# Patient Record
Sex: Male | Born: 1945
Health system: Southern US, Community
[De-identification: ages and names within clinical notes are randomized; demographics above are authoritative.]

## PROBLEM LIST (undated history)

## (undated) DIAGNOSIS — G40209 Localization-related (focal) (partial) symptomatic epilepsy and epileptic syndromes with complex partial seizures, not intractable, without status epilepticus: Secondary | ICD-10-CM

## (undated) DIAGNOSIS — C61 Malignant neoplasm of prostate: Secondary | ICD-10-CM

## (undated) DIAGNOSIS — N390 Urinary tract infection, site not specified: Secondary | ICD-10-CM

## (undated) DIAGNOSIS — R569 Unspecified convulsions: Secondary | ICD-10-CM

## (undated) DIAGNOSIS — E7800 Pure hypercholesterolemia, unspecified: Secondary | ICD-10-CM

## (undated) DIAGNOSIS — Z464 Encounter for fitting and adjustment of orthodontic device: Secondary | ICD-10-CM

## (undated) DIAGNOSIS — G40909 Epilepsy, unspecified, not intractable, without status epilepticus: Secondary | ICD-10-CM

## (undated) DIAGNOSIS — M722 Plantar fascial fibromatosis: Secondary | ICD-10-CM

## (undated) DIAGNOSIS — G450 Vertebro-basilar artery syndrome: Secondary | ICD-10-CM

## (undated) DIAGNOSIS — H5710 Ocular pain, unspecified eye: Secondary | ICD-10-CM

## (undated) DIAGNOSIS — K219 Gastro-esophageal reflux disease without esophagitis: Secondary | ICD-10-CM

## (undated) DIAGNOSIS — G43909 Migraine, unspecified, not intractable, without status migrainosus: Secondary | ICD-10-CM

## (undated) DIAGNOSIS — M5412 Radiculopathy, cervical region: Secondary | ICD-10-CM

## (undated) DIAGNOSIS — K635 Polyp of colon: Secondary | ICD-10-CM

## (undated) DIAGNOSIS — IMO0001 Reserved for inherently not codable concepts without codable children: Secondary | ICD-10-CM

## (undated) DIAGNOSIS — R42 Dizziness and giddiness: Secondary | ICD-10-CM

## (undated) DIAGNOSIS — M7710 Lateral epicondylitis, unspecified elbow: Secondary | ICD-10-CM

## (undated) DIAGNOSIS — M109 Gout, unspecified: Secondary | ICD-10-CM

## (undated) HISTORY — DX: Vertebro-basilar artery syndrome: G45.0

## (undated) HISTORY — DX: Plantar fascial fibromatosis: M72.2

## (undated) HISTORY — DX: Pure hypercholesterolemia, unspecified: E78.00

## (undated) HISTORY — PX: NECK SURGERY: SHX720

## (undated) HISTORY — DX: Lateral epicondylitis, unspecified elbow: M77.10

## (undated) HISTORY — PX: HEMORROIDECTOMY: SUR656

## (undated) HISTORY — DX: Polyp of colon: K63.5

## (undated) HISTORY — DX: Epilepsy, unspecified, not intractable, without status epilepticus: G40.909

## (undated) HISTORY — DX: Reserved for inherently not codable concepts without codable children: IMO0001

## (undated) HISTORY — DX: Gastro-esophageal reflux disease without esophagitis: K21.9

## (undated) HISTORY — DX: Localization-related (focal) (partial) symptomatic epilepsy and epileptic syndromes with complex partial seizures, not intractable, without status epilepticus: G40.209

## (undated) HISTORY — DX: Migraine, unspecified, not intractable, without status migrainosus: G43.909

## (undated) HISTORY — DX: Ocular pain, unspecified eye: H57.10

## (undated) HISTORY — DX: Dizziness and giddiness: R42

## (undated) HISTORY — DX: Radiculopathy, cervical region: M54.12

## (undated) HISTORY — DX: Encounter for fitting and adjustment of orthodontic device: Z46.4

## (undated) HISTORY — PX: BACK SURGERY: SHX140

## (undated) HISTORY — PX: PROSTATE BIOPSY: SHX241

## (undated) HISTORY — DX: Unspecified convulsions: R56.9

## (undated) HISTORY — DX: Urinary tract infection, site not specified: N39.0

## (undated) HISTORY — DX: Gout, unspecified: M10.9

---

## 1998-10-22 ENCOUNTER — Encounter: Payer: Self-pay | Admitting: Emergency Medicine

## 1998-10-22 ENCOUNTER — Emergency Department (HOSPITAL_COMMUNITY): Admission: EM | Admit: 1998-10-22 | Discharge: 1998-10-22 | Payer: Self-pay | Admitting: Emergency Medicine

## 2001-11-23 ENCOUNTER — Emergency Department (HOSPITAL_COMMUNITY): Admission: EM | Admit: 2001-11-23 | Discharge: 2001-11-23 | Payer: Self-pay | Admitting: *Deleted

## 2002-03-21 ENCOUNTER — Encounter: Payer: Self-pay | Admitting: Emergency Medicine

## 2002-03-21 ENCOUNTER — Emergency Department (HOSPITAL_COMMUNITY): Admission: EM | Admit: 2002-03-21 | Discharge: 2002-03-21 | Payer: Self-pay | Admitting: Emergency Medicine

## 2004-07-03 ENCOUNTER — Emergency Department (HOSPITAL_COMMUNITY): Admission: EM | Admit: 2004-07-03 | Discharge: 2004-07-03 | Payer: Self-pay | Admitting: Emergency Medicine

## 2004-08-30 ENCOUNTER — Ambulatory Visit (HOSPITAL_COMMUNITY): Admission: RE | Admit: 2004-08-30 | Discharge: 2004-08-30 | Payer: Self-pay | Admitting: Gastroenterology

## 2005-04-17 ENCOUNTER — Encounter: Admission: RE | Admit: 2005-04-17 | Discharge: 2005-04-17 | Payer: Self-pay | Admitting: Neurological Surgery

## 2005-05-15 ENCOUNTER — Ambulatory Visit (HOSPITAL_COMMUNITY): Admission: RE | Admit: 2005-05-15 | Discharge: 2005-05-16 | Payer: Self-pay | Admitting: Neurological Surgery

## 2006-03-13 ENCOUNTER — Encounter: Admission: RE | Admit: 2006-03-13 | Discharge: 2006-03-13 | Payer: Self-pay | Admitting: Family Medicine

## 2013-06-13 ENCOUNTER — Encounter: Payer: Self-pay | Admitting: *Deleted

## 2013-10-12 DIAGNOSIS — D3131 Benign neoplasm of right choroid: Secondary | ICD-10-CM | POA: Insufficient documentation

## 2013-10-12 DIAGNOSIS — H43399 Other vitreous opacities, unspecified eye: Secondary | ICD-10-CM | POA: Insufficient documentation

## 2013-10-21 ENCOUNTER — Other Ambulatory Visit: Payer: Self-pay | Admitting: Gastroenterology

## 2013-11-12 DIAGNOSIS — G40309 Generalized idiopathic epilepsy and epileptic syndromes, not intractable, without status epilepticus: Secondary | ICD-10-CM | POA: Insufficient documentation

## 2013-12-02 LAB — HM COLONOSCOPY

## 2014-03-08 ENCOUNTER — Emergency Department (HOSPITAL_COMMUNITY)
Admission: EM | Admit: 2014-03-08 | Discharge: 2014-03-08 | Disposition: A | Discharge status: Home / Self Care | Payer: Medicare Other | Attending: Emergency Medicine | Admitting: Emergency Medicine

## 2014-03-08 ENCOUNTER — Encounter (HOSPITAL_COMMUNITY): Payer: Self-pay

## 2014-03-08 ENCOUNTER — Emergency Department (HOSPITAL_COMMUNITY): Payer: Medicare Other

## 2014-03-08 DIAGNOSIS — Z8639 Personal history of other endocrine, nutritional and metabolic disease: Secondary | ICD-10-CM | POA: Diagnosis not present

## 2014-03-08 DIAGNOSIS — Z8739 Personal history of other diseases of the musculoskeletal system and connective tissue: Secondary | ICD-10-CM | POA: Insufficient documentation

## 2014-03-08 DIAGNOSIS — G43909 Migraine, unspecified, not intractable, without status migrainosus: Secondary | ICD-10-CM | POA: Diagnosis not present

## 2014-03-08 DIAGNOSIS — K219 Gastro-esophageal reflux disease without esophagitis: Secondary | ICD-10-CM | POA: Diagnosis not present

## 2014-03-08 DIAGNOSIS — Z87440 Personal history of urinary (tract) infections: Secondary | ICD-10-CM | POA: Insufficient documentation

## 2014-03-08 DIAGNOSIS — Z86010 Personal history of colonic polyps: Secondary | ICD-10-CM | POA: Insufficient documentation

## 2014-03-08 DIAGNOSIS — Z87891 Personal history of nicotine dependence: Secondary | ICD-10-CM | POA: Diagnosis not present

## 2014-03-08 DIAGNOSIS — R42 Dizziness and giddiness: Secondary | ICD-10-CM | POA: Insufficient documentation

## 2014-03-08 DIAGNOSIS — Z79899 Other long term (current) drug therapy: Secondary | ICD-10-CM | POA: Insufficient documentation

## 2014-03-08 DIAGNOSIS — G40909 Epilepsy, unspecified, not intractable, without status epilepticus: Secondary | ICD-10-CM | POA: Diagnosis not present

## 2014-03-08 LAB — CBC
HCT: 41.8 % (ref 39.0–52.0)
HEMOGLOBIN: 14.2 g/dL (ref 13.0–17.0)
MCH: 30.5 pg (ref 26.0–34.0)
MCHC: 34 g/dL (ref 30.0–36.0)
MCV: 89.7 fL (ref 78.0–100.0)
PLATELETS: 196 10*3/uL (ref 150–400)
RBC: 4.66 MIL/uL (ref 4.22–5.81)
RDW: 12.7 % (ref 11.5–15.5)
WBC: 4.8 10*3/uL (ref 4.0–10.5)

## 2014-03-08 LAB — URINALYSIS, ROUTINE W REFLEX MICROSCOPIC
Bilirubin Urine: NEGATIVE
Glucose, UA: NEGATIVE mg/dL
Hgb urine dipstick: NEGATIVE
Ketones, ur: NEGATIVE mg/dL
LEUKOCYTES UA: NEGATIVE
NITRITE: NEGATIVE
PH: 5.5 (ref 5.0–8.0)
Protein, ur: NEGATIVE mg/dL
SPECIFIC GRAVITY, URINE: 1.012 (ref 1.005–1.030)
UROBILINOGEN UA: 0.2 mg/dL (ref 0.0–1.0)

## 2014-03-08 LAB — DIFFERENTIAL
Basophils Absolute: 0 10*3/uL (ref 0.0–0.1)
Basophils Relative: 1 % (ref 0–1)
Eosinophils Absolute: 0.2 10*3/uL (ref 0.0–0.7)
Eosinophils Relative: 4 % (ref 0–5)
LYMPHS ABS: 1.5 10*3/uL (ref 0.7–4.0)
LYMPHS PCT: 31 % (ref 12–46)
MONO ABS: 0.5 10*3/uL (ref 0.1–1.0)
MONOS PCT: 11 % (ref 3–12)
NEUTROS ABS: 2.6 10*3/uL (ref 1.7–7.7)
Neutrophils Relative %: 53 % (ref 43–77)

## 2014-03-08 LAB — COMPREHENSIVE METABOLIC PANEL
ALBUMIN: 3.6 g/dL (ref 3.5–5.2)
ALT: 14 U/L (ref 0–53)
ANION GAP: 14 (ref 5–15)
AST: 14 U/L (ref 0–37)
Alkaline Phosphatase: 96 U/L (ref 39–117)
BUN: 18 mg/dL (ref 6–23)
CALCIUM: 9 mg/dL (ref 8.4–10.5)
CO2: 22 mEq/L (ref 19–32)
CREATININE: 0.93 mg/dL (ref 0.50–1.35)
Chloride: 101 mEq/L (ref 96–112)
GFR calc Af Amer: >90 mL/min (ref >90)
GFR calc non Af Amer: 85 mL/min — ABNORMAL LOW (ref >90)
GLUCOSE: 98 mg/dL (ref 70–99)
POTASSIUM: 4.4 meq/L (ref 3.7–5.3)
Sodium: 137 mEq/L (ref 137–147)
Total Bilirubin: 0.2 mg/dL — ABNORMAL LOW (ref 0.3–1.2)
Total Protein: 7.1 g/dL (ref 6.0–8.3)

## 2014-03-08 LAB — I-STAT CHEM 8, ED
BUN: 20 mg/dL (ref 6–23)
CALCIUM ION: 1.09 mmol/L — AB (ref 1.13–1.30)
CHLORIDE: 104 meq/L (ref 96–112)
Creatinine, Ser: 0.9 mg/dL (ref 0.50–1.35)
Glucose, Bld: 97 mg/dL (ref 70–99)
HEMATOCRIT: 46 % (ref 39.0–52.0)
Hemoglobin: 15.6 g/dL (ref 13.0–17.0)
Potassium: 4.3 mEq/L (ref 3.7–5.3)
Sodium: 138 mEq/L (ref 137–147)
TCO2: 22 mmol/L (ref 0–100)

## 2014-03-08 LAB — RAPID URINE DRUG SCREEN, HOSP PERFORMED
AMPHETAMINES: NOT DETECTED
BARBITURATES: NOT DETECTED
BENZODIAZEPINES: NOT DETECTED
Cocaine: NOT DETECTED
Opiates: NOT DETECTED
TETRAHYDROCANNABINOL: NOT DETECTED

## 2014-03-08 LAB — I-STAT TROPONIN, ED: TROPONIN I, POC: 0 ng/mL (ref 0.00–0.08)

## 2014-03-08 LAB — ETHANOL: Alcohol, Ethyl (B): <11 mg/dL (ref 0–11)

## 2014-03-08 LAB — I-STAT CG4 LACTIC ACID, ED: Lactic Acid, Venous: 1.02 mmol/L (ref 0.5–2.2)

## 2014-03-08 LAB — PROTIME-INR
INR: 1.04 (ref 0.00–1.49)
Prothrombin Time: 13.8 seconds (ref 11.6–15.2)

## 2014-03-08 LAB — APTT: APTT: 30 s (ref 24–37)

## 2014-03-08 NOTE — ED Notes (Signed)
Pt has had episode of vertigo in last three weeks.  Pt today felt same vertigo at work. Went home and took meds.  Went back to work and vertigo continued.  Pt wife states that coworkers made mention that patient seemed to be searching for words.  Not as coherent as normal. Pt is a/o at this time.  No neuro deficits noted.  Wife states patient has improved since she picked him up at work.  Sent by urgent care for eval "stroke"

## 2014-03-08 NOTE — ED Provider Notes (Signed)
CSN: [id number]     Arrival date & time 03/08/14  1106 History   First MD Initiated Contact with Patient 03/08/14 1116     Chief Complaint  Patient presents with  . Dizziness     (Consider location/radiation/quality/duration/timing/severity/associated sxs/prior Treatment) The history is provided by the patient and medical records.    This is a 68 year old male with past medical history significant for migraine headaches, seizure disorder, hyperlipidemia, GERD, presenting to the ED from urgent care for further evaluation of dizziness. Patient does have history of vertigo, seen at urgent care 3 weeks ago for his vertigo symptoms. He was prescribed meclizine and Phenergan with improvement. Upon waking this morning he had recurrent dizziness, which he described as a room spinning sensation.  States he took his Phenergan and meclizine with improvement of his symptoms. Upon arriving to work, coworkers noted that he appeared somewhat drowsy-- wife notes these medications have been known to make him drowsy.  States his speech appeared somewhat slurred, so coworkers called wife to come pick him up. States she called the urgent care and had him evaluated-- was sent to the ED for further evaluation.  Patient states his dizziness has improved, states the room is still spinning but now in slow motion. He denies any current nausea or vomiting.  No chest pain or shortness of breath. He states when he was trying to walk into the emergency room, he felt that his gait was somewhat ataxic to the right. Patient has no prior history of TIA or stroke. He is not currently on any anticoagulation. He does take Tegretol for his seizures, wife notes he has had only ever had 2 seizures.  Patient is followed by neurology, Dr. Sabra Heck in [place], [place].  Past Medical History  Diagnosis Date  . Migraine     headaches  . Seizure disorder     Dr. Sabra Heck ([facility])  . Cervical radiculopathy   . Hypercholesterolemia   . Lateral  epicondylitis     2002  . Plantar fasciitis   . GERD (gastroesophageal reflux disease)   . Colon polyp     (2006- adenoma and tubulovillous adenoma; 2010-adenomas; repeat 201)-Dr. Wynetta Emery  . Ophthalmalgia     Dr. Gershon Crane  . Orthodontics     Dr. Junius Roads  . UTI (urinary tract infection)    No past surgical history on file. No family history on file. History  Substance Use Topics  . Smoking status: Former Research scientist (life sciences)  . Smokeless tobacco: Not on file     Comment: Quit 1988, Was a heavy smoker (20-30/day)  . Alcohol Use: No    Review of Systems  Neurological: Positive for dizziness.  All other systems reviewed and are negative.   Allergies  Review of patient's allergies indicates no known allergies.  Home Medications   Prior to Admission medications   Medication Sig Start Date End Date Taking? Authorizing Provider  carbamazepine (TEGRETOL) 200 MG tablet Take 400 mg by mouth 3 (three) times daily.    Historical Provider, MD  omeprazole (PRILOSEC) 20 MG capsule Take 20 mg by mouth daily.    Historical Provider, MD  ZOLMitriptan (ZOMIG) 2.5 MG tablet Take 2.5 mg by mouth 2 (two) times daily.    Historical Provider, MD   BP 167/96 mmHg  Pulse 63  Temp(Src) 97.8 F (36.6 C) (Oral)  Resp 18  SpO2 96%   Physical Exam  Constitutional: He is oriented to person, place, and time. He appears well-developed and well-nourished. No distress.  HENT:  Head: Normocephalic and atraumatic.  Mouth/Throat: Oropharynx is clear and moist.  Eyes: Conjunctivae and EOM are normal. Pupils are equal, round, and reactive to light.  Neck: Normal range of motion.  Cardiovascular: Normal rate, regular rhythm and normal heart sounds.   Pulmonary/Chest: Effort normal and breath sounds normal. No respiratory distress. He has no wheezes.  Abdominal: Soft. Bowel sounds are normal. There is no tenderness. There is no guarding.  Musculoskeletal: Normal range of motion.  Neurological: He is alert and oriented to  person, place, and time. He displays no tremor. He displays no seizure activity.  AAOx3, answering questions and following commands appropriately; equal strength and sensation UE and LE bilaterally; CN grossly intact; moves all extremities appropriately without ataxia; normal finger to nose bilaterally; no pronator drift; symmetric forehead wrinkle and smile; no focal neuro deficits or facial asymmetry appreciated  Skin: Skin is warm and dry. He is not diaphoretic.  Psychiatric: He has a normal mood and affect.  Nursing note and vitals reviewed.   ED Course  Procedures (including critical care time) Labs Review Labs Reviewed  COMPREHENSIVE METABOLIC PANEL - Abnormal; Notable for the following:    Total Bilirubin 0.2 (*)    GFR calc non Af Amer 85 (*)    All other components within normal limits  URINALYSIS, ROUTINE W REFLEX MICROSCOPIC - Abnormal; Notable for the following:    APPearance CLOUDY (*)    All other components within normal limits  I-STAT CHEM 8, ED - Abnormal; Notable for the following:    Calcium, Ion 1.09 (*)    All other components within normal limits  ETHANOL  PROTIME-INR  APTT  CBC  DIFFERENTIAL  URINE RAPID DRUG SCREEN (HOSP PERFORMED)  I-STAT TROPOININ, ED  I-STAT TROPOININ, ED  I-STAT CG4 LACTIC ACID, ED    Imaging Review Ct Head Wo Contrast  03/08/2014   CLINICAL DATA:  Vertigo  EXAM: CT HEAD WITHOUT CONTRAST  TECHNIQUE: Contiguous axial images were obtained from the base of the skull through the vertex without intravenous contrast.  COMPARISON:  None.  FINDINGS: No evidence of parenchymal hemorrhage or extra-axial fluid collection. No mass lesion, mass effect, or midline shift.  No CT evidence of acute infarction.  Mild intracranial atherosclerosis.  Volume loss of the left temporal lobe with associated focal calcifications (series 2/image 5), suggesting sequela of prior infarct, infection, or trauma.  Cerebral volume is otherwise within normal limits. No  ventriculomegaly.  The visualized paranasal sinuses are essentially clear. The mastoid air cells are unopacified.  No evidence of calvarial fracture.  IMPRESSION: Sequela of prior infarct, infection, or trauma involving the left temporal lobe, chronic.  No evidence of acute intracranial abnormality.   Electronically Signed   By: Julian Hy M.D.   On: 03/08/2014 12:39   Mr Brain Wo Contrast  03/08/2014   CLINICAL DATA:  68 year old male with sudden onset of vertigo. Symptoms now for 3 weeks and worsening. Initial encounter.  EXAM: MRI HEAD WITHOUT CONTRAST  TECHNIQUE: Multiplanar, multiecho pulse sequences of the brain and surrounding structures were obtained without intravenous contrast.  COMPARISON:  Head CT without contrast 1208 hr today.  FINDINGS: Chronic hemorrhage in knee anterior inferior left temporal lobe. Hemosiderin and encephalomalacia affecting the left fusiform and inferior temporal gyri. This corresponds to the area of punctate dystrophic calcification on the earlier CT. No associated mass effect or edema.  No chronic hemorrhage elsewhere in the brain. No other cortical encephalomalacia identified. No restricted diffusion to suggest acute infarction. No  midline shift, mass effect, evidence of mass lesion, ventriculomegaly, extra-axial collection or acute intracranial hemorrhage. Cervicomedullary junction and pituitary are within normal limits. Negative visualized cervical spine.  Evidence of poor flow or occlusion in the distal right vertebral artery (series 8, image 2). However, no restricted diffusion or signal abnormality in the posterior fossa. Other Major intracranial vascular flow voids are preserved. Visible internal auditory structures appear normal. Trace inferior right mastoid fluid. Negative visualized nasopharynx.  Outside of the left temporal lobe gray and white matter signal is normal for age throughout the brain.  Negative paranasal sinuses. Visualized orbit soft tissues are  within normal limits. Visualized scalp soft tissues are within normal limits. Visualized bone marrow signal is within normal limits.  IMPRESSION: 1. There is evidence of poor flow or occlusion of the distal right vertebral artery, but this may be chronic given the absence of associated infarct or signal changes in the posterior fossa. 2. Chronic hemorrhage and encephalomalacia in the anterior inferior left temporal lobe, corresponding to the earlier CT findings. 3. Otherwise normal non contrast MRI appearance of the brain.   Electronically Signed   By: Lars Pinks M.D.   On: 03/08/2014 14:46     EKG Interpretation None      MDM   Final diagnoses:  Dizziness   68 year old male with dizziness. Seen at urgent care 3 weeks ago for the same, diagnosed with vertigo started on meclizine. States worsening symptoms this morning despite taking his home meds.  Patient's wife reported he had a mildly unsteady gait this morning while attempting to walk into the emergency department.  On exam, patient is without focal neurologic deficit.  He has no prior history of TIA or stroke to his knowledge.  Code stroke not activated, but will proceed with stroke work-up.    EKG sinus rhythm without ischemic change.  Lab work overall reassuring. CT head with questionable prior infarct versus infection versus trauma of left temporal lobe which appears chronic in nature. After brief period of observation, patient continues complaining of dizziness. He has been able to get out of bed and ambulate to the bathroom without assistance with steady gait. Will proceed with MRI.   MRI with evidence of poor flow versus occlusion of the distal right vertebral artery, possibly chronic, as well as chronic hemorrhage and encephalomalacia in the anterior inferior left temporal lobe. There is no noted acute infarct or hemorrhage on MRI. Case was discussed with neurology, Dr. Armida Sans-- vascular changes may be contributing to dizziness,  recommends starting daily aspirin, outpatient follow-up with neurology as no acute intervention warranted at this time.  MRI findings and plan of care discussed with patient and wife at bedside, they knowledge understanding and agreed.  Will FU as directed with her neurologist, Dr. Sabra Heck.  Discussed plan with patient, he/she acknowledged understanding and agreed with plan of care.  Return precautions given for new or worsening symptoms.  Larene Pickett, PA-C 03/08/14 [place], PA-C 03/08/14 1538  Quintella Reichert, MD 03/08/14 [phone]

## 2014-03-08 NOTE — ED Notes (Signed)
Patient transported to MRI 

## 2014-03-08 NOTE — ED Provider Notes (Signed)
Unable to upload EKG in MUSE.  EKG with sinus bradycardia, rate of 57. Normal axis, no acute ST changes.    Quintella Reichert, MD 03/08/14 [phone]

## 2014-03-08 NOTE — ED Notes (Signed)
Patient transported to CT 

## 2014-03-08 NOTE — Discharge Instructions (Signed)
Recommend start taking daily baby Aspirin (81mg )-- any brand is fine. Follow-up with your neurologist-- copies of labs and MRI attached on back. May continue your regular meclizine if needed. Return to the ED for new concerns.

## 2014-03-10 DIAGNOSIS — R42 Dizziness and giddiness: Secondary | ICD-10-CM | POA: Insufficient documentation

## 2014-09-27 ENCOUNTER — Ambulatory Visit: Payer: Self-pay | Admitting: Neurology

## 2014-10-08 ENCOUNTER — Encounter: Payer: Self-pay | Admitting: Neurology

## 2014-10-08 ENCOUNTER — Ambulatory Visit (INDEPENDENT_AMBULATORY_CARE_PROVIDER_SITE_OTHER): Payer: Medicare Other | Admitting: Neurology

## 2014-10-08 VITALS — BP 144/87 | HR 65 | Ht 69.0 in | Wt 247.4 lb

## 2014-10-08 DIAGNOSIS — G40219 Localization-related (focal) (partial) symptomatic epilepsy and epileptic syndromes with complex partial seizures, intractable, without status epilepticus: Secondary | ICD-10-CM | POA: Diagnosis not present

## 2014-10-08 DIAGNOSIS — Z5181 Encounter for therapeutic drug level monitoring: Secondary | ICD-10-CM

## 2014-10-08 DIAGNOSIS — G40209 Localization-related (focal) (partial) symptomatic epilepsy and epileptic syndromes with complex partial seizures, not intractable, without status epilepticus: Secondary | ICD-10-CM

## 2014-10-08 HISTORY — DX: Localization-related (focal) (partial) symptomatic epilepsy and epileptic syndromes with complex partial seizures, not intractable, without status epilepticus: G40.209

## 2014-10-08 MED ORDER — CARBAMAZEPINE 200 MG PO TABS: 400.0000 mg | ORAL_TABLET | Freq: Three times a day (TID) | ORAL | Status: DC

## 2014-10-08 NOTE — Patient Instructions (Signed)

## 2014-10-08 NOTE — Progress Notes (Signed)
Reason for visit: Seizures  Referring physician: Dr. Fannie Knee is a 69 y.o. male  History of present illness:  [name] is a 69 year old right-handed white male with a history of intractable epilepsy. The patient indicates that he first began having seizures around age 69. He denies any definite head trauma prior to the onset of the seizures. The first events were associated with loss of consciousness without warning. The patient has been on chronic anti-epileptic medication, and the seizures currently have partial complex features. During the seizure, he may have some automatic movements of his hands. The patient indicates that he has a sensation that the seizures are occurring with a feeling as if a paper bag is being pulled up over his body. The patient also has other brief episodes lasting only a few seconds where he has a minor sensation without any clouding of consciousness. The last true partial complex seizure occurred about 15 years ago, but the patient has one or 2 brief subjective events of sensations occurring each week. The patient is operating a motor vehicle without difficulty. He indicates that if he has an event while driving, it does not affect his ability to operate a car. He has not been restricted with his driving. MRI of the brain was done in December 2015 when he had an episode of vertigo. This reveals evidence of a chronic left anterior temporal encephalomalacia that could be consistent with prior head trauma. The patient has had evidence of distal right vertebral artery occlusion, but no posterior circulation stroke events. The patient is not bothered with the vertigo at this time. He denies any focal numbness or weakness of extremities, no significant gait disturbance or difficulty controlling the bowels or the bladder. He comes to this office for an evaluation. In the distant past, the patient was followed by Dr. Theodoro Clock and Dr. Erling Cruz from this practice, more  recently has he has been seen and followed by Dr. Harrie Foreman. The patient lives in the [place] area, and he wishes to transfer medical care to this area. He remains on brand-name carbamazepine, tolerating the medication well.   Past Medical History  Diagnosis Date  . Migraine     headaches  . Seizure disorder     Dr. Sabra Heck ([facility])  . Cervical radiculopathy   . Hypercholesterolemia   . Lateral epicondylitis     2002  . Plantar fasciitis   . GERD (gastroesophageal reflux disease)   . Colon polyp     (2006- adenoma and tubulovillous adenoma; 2010-adenomas; repeat 201)-Dr. Wynetta Emery  . Ophthalmalgia     Dr. Gershon Crane  . Orthodontics     Dr. Junius Roads  . UTI (urinary tract infection)   . Vertebrobasilar insufficiency   . Partial epilepsy with impairment of consciousness   . Seizures   . Vertigo   . Complex partial seizure disorder 10/08/2014    Past Surgical History  Procedure Laterality Date  . Hemorroidectomy    . Neck surgery    . Back surgery      Family History  Problem Relation Age of Onset  . Alcohol abuse Father   . COPD Sister   . Seizures Neg Hx   . Cancer Sister     lung    Social history:  reports that he quit smoking about 28 years ago. He has never used smokeless tobacco. He reports that he does not drink alcohol or use illicit drugs.  Medications:  Prior to Admission medications  Medication Sig Start Date End Date Taking? Authorizing Provider  aspirin 81 MG tablet Take 81 mg by mouth daily.   Yes Historical Provider, MD  carbamazepine (TEGRETOL) 200 MG tablet Take 400 mg by mouth 3 (three) times daily.   Yes Historical Provider, MD  famotidine (PEPCID) 20 MG tablet Take 20 mg by mouth daily as needed for heartburn or indigestion (heartburn).   Yes Historical Provider, MD  meclizine (ANTIVERT) 25 MG tablet Take 25 mg by mouth 3 (three) times daily as needed for dizziness (dizziness).   Yes Historical Provider, MD  promethazine (PHENERGAN) 25 MG  tablet Take 25 mg by mouth every 12 (twelve) hours as needed for nausea or vomiting (nausea).   Yes Historical Provider, MD     No Known Allergies  ROS:  Out of a complete 14 system review of symptoms, the patient complains only of the following symptoms, and all other reviewed systems are negative.  Hearing loss, vertigo Snoring Muscle cramps Headache  Blood pressure 144/87, pulse 65, height 5\' 9"  (1.753 m), weight 247 lb 6.4 oz (112.22 kg).  Physical Exam  General: The patient is alert and cooperative at the time of the examination. The patient is minimally to moderately obese.  Eyes: Pupils are equal, round, and reactive to light. Discs are flat bilaterally.  Neck: The neck is supple, no carotid bruits are noted.  Respiratory: The respiratory examination is clear.  Cardiovascular: The cardiovascular examination reveals a regular rate and rhythm, no obvious murmurs or rubs are noted.  Skin: Extremities are without significant edema.  Neurologic Exam  Mental status: The patient is alert and oriented x 3 at the time of the examination. The patient has apparent normal recent and remote memory, with an apparently normal attention span and concentration ability.  Cranial nerves: Facial symmetry is present. There is good sensation of the face to pinprick and soft touch bilaterally. The strength of the facial muscles and the muscles to head turning and shoulder shrug are normal bilaterally. Speech is well enunciated, no aphasia or dysarthria is noted. Extraocular movements are full. Visual fields are full. The tongue is midline, and the patient has symmetric elevation of the soft palate. No obvious hearing deficits are noted.  Motor: The motor testing reveals 5 over 5 strength of all 4 extremities. Good symmetric motor tone is noted throughout.  Sensory: Sensory testing is intact to pinprick, soft touch, vibration sensation, and position sense on all 4 extremities. No evidence of  extinction is noted.  Coordination: Cerebellar testing reveals good finger-nose-finger and heel-to-shin bilaterally.  Gait and station: Gait is normal. Tandem gait is normal. Romberg is negative. No drift is seen.  Reflexes: Deep tendon reflexes are symmetric and normal bilaterally. Toes are downgoing bilaterally.   MRI brain 03/08/14:  IMPRESSION: 1. There is evidence of poor flow or occlusion of the distal right vertebral artery, but this may be chronic given the absence of associated infarct or signal changes in the posterior fossa. 2. Chronic hemorrhage and encephalomalacia in the anterior inferior left temporal lobe, corresponding to the earlier CT findings. 3. Otherwise normal non contrast MRI appearance of the brain.  * MRI scan images were reviewed online. I agree with the written report.    Assessment/Plan:  1. Intractable epilepsy, partial complex seizures  2. Left anterior temporal encephalomalacia  The patient appears to have a brain lesion that could be epileptogenic. The patient is on carbamazepine taking brand-name Tegretol. He will continue on this medication. The patient indicates  that he has brief seizure events one or 2 times a week, but this does not result in any alteration of consciousness. The last episodes that did alter consciousness were 15 years ago when he was not taking his medications properly. The patient will undergo blood work today. He will follow-up through this office in about 8-9 months. He will contact our office if he is having any issues.  Jill Alexanders MD 10/08/2014 5:37 PM  [facility] [address] [place], [place] [address]  Phone [phone] Fax [phone]

## 2014-10-09 LAB — CBC WITH DIFFERENTIAL/PLATELET
Basophils Absolute: 0 10*3/uL (ref 0.0–0.2)
Basos: 1 %
EOS (ABSOLUTE): 0.4 10*3/uL (ref 0.0–0.4)
EOS: 7 %
HEMATOCRIT: 45.7 % (ref 37.5–51.0)
Hemoglobin: 15.5 g/dL (ref 12.6–17.7)
Immature Grans (Abs): 0 10*3/uL (ref 0.0–0.1)
Immature Granulocytes: 0 %
LYMPHS ABS: 1.7 10*3/uL (ref 0.7–3.1)
Lymphs: 32 %
MCH: 31.6 pg (ref 26.6–33.0)
MCHC: 33.9 g/dL (ref 31.5–35.7)
MCV: 93 fL (ref 79–97)
MONOCYTES: 11 %
MONOS ABS: 0.6 10*3/uL (ref 0.1–0.9)
NEUTROS ABS: 2.7 10*3/uL (ref 1.4–7.0)
Neutrophils: 49 %
Platelets: 216 10*3/uL (ref 150–379)
RBC: 4.91 x10E6/uL (ref 4.14–5.80)
RDW: 13.9 % (ref 12.3–15.4)
WBC: 5.4 10*3/uL (ref 3.4–10.8)

## 2014-10-09 LAB — COMPREHENSIVE METABOLIC PANEL
A/G RATIO: 1.6 (ref 1.1–2.5)
ALT: 13 IU/L (ref 0–44)
AST: 14 IU/L (ref 0–40)
Albumin: 4.1 g/dL (ref 3.6–4.8)
Alkaline Phosphatase: 83 IU/L (ref 39–117)
BUN/Creatinine Ratio: 23 — ABNORMAL HIGH (ref 10–22)
BUN: 20 mg/dL (ref 8–27)
Bilirubin Total: 0.2 mg/dL (ref 0.0–1.2)
CO2: 23 mmol/L (ref 18–29)
CREATININE: 0.86 mg/dL (ref 0.76–1.27)
Calcium: 9 mg/dL (ref 8.6–10.2)
Chloride: 102 mmol/L (ref 97–108)
GFR calc Af Amer: 103 mL/min/{1.73_m2} (ref >59)
GFR, EST NON AFRICAN AMERICAN: 89 mL/min/{1.73_m2} (ref >59)
GLOBULIN, TOTAL: 2.5 g/dL (ref 1.5–4.5)
Glucose: 94 mg/dL (ref 65–99)
Potassium: 4.7 mmol/L (ref 3.5–5.2)
SODIUM: 144 mmol/L (ref 134–144)
TOTAL PROTEIN: 6.6 g/dL (ref 6.0–8.5)

## 2014-10-09 LAB — CARBAMAZEPINE LEVEL, TOTAL: Carbamazepine Lvl: 10.6 ug/mL (ref 4.0–12.0)

## 2014-10-11 ENCOUNTER — Telehealth: Payer: Self-pay

## 2014-10-11 NOTE — Telephone Encounter (Signed)
Left voicemail relaying results. 

## 2014-10-11 NOTE — Telephone Encounter (Signed)
-----   Message from Kathrynn Ducking, MD sent at 10/10/2014  6:00 PM EDT ----- Blood work is unremarkable. Good carbamazepine level, no change in dosing. Please call the patient. ----- Message -----    From: [facility] Lab Results In Interface    Sent: 10/09/2014   5:41 AM      To: Kathrynn Ducking, MD

## 2015-03-04 ENCOUNTER — Telehealth: Payer: Self-pay | Admitting: Neurology

## 2015-03-04 NOTE — Telephone Encounter (Signed)
Wife called to advise carbamazepine (TEGRETOL) 200 MG tablet Rx expires end of December. Needs new Rx for this and would like for it to be written for more than 5 months. Is aware Dr. Jannifer Franklin is out of the office until Monday.

## 2015-03-07 MED ORDER — CARBAMAZEPINE 200 MG PO TABS: 400.0000 mg | ORAL_TABLET | Freq: Three times a day (TID) | ORAL | Status: DC

## 2015-03-07 NOTE — Telephone Encounter (Addendum)
Message was just forwarded to me this morning.  Ms Brandon Flores is aware Rx has been sent.  They will be in for an appt in March.  Says the drug has changed tiers on ins.  I recommended they contact ins and see if they offer a tier exception.  As well, provided number to [employer] patient assistance [phone] to see if they qualify for any assisatnce with this drug.  She express understanding and appreciation, and is agreeable to checking into these options.

## 2015-06-01 ENCOUNTER — Encounter: Payer: Self-pay | Admitting: Neurology

## 2015-06-01 ENCOUNTER — Ambulatory Visit (INDEPENDENT_AMBULATORY_CARE_PROVIDER_SITE_OTHER): Payer: Medicare Other | Admitting: Neurology

## 2015-06-01 VITALS — BP 134/77 | HR 64 | Ht 69.0 in | Wt 254.0 lb

## 2015-06-01 DIAGNOSIS — G40219 Localization-related (focal) (partial) symptomatic epilepsy and epileptic syndromes with complex partial seizures, intractable, without status epilepticus: Secondary | ICD-10-CM

## 2015-06-01 MED ORDER — CARBAMAZEPINE 200 MG PO TABS: 400.0000 mg | ORAL_TABLET | Freq: Three times a day (TID) | ORAL | Status: DC

## 2015-06-01 NOTE — Patient Instructions (Signed)
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
## 2015-11-21 ENCOUNTER — Telehealth: Payer: Self-pay | Admitting: Neurology

## 2015-11-21 ENCOUNTER — Other Ambulatory Visit: Payer: Self-pay | Admitting: *Deleted

## 2015-11-21 ENCOUNTER — Other Ambulatory Visit: Payer: Self-pay | Admitting: Neurology

## 2015-11-21 MED ORDER — CARBAMAZEPINE 200 MG PO TABS
400.0000 mg | ORAL_TABLET | Freq: Three times a day (TID) | ORAL | 1 refills | Status: DC
Start: 2015-11-21 — End: 2016-05-31

## 2015-11-21 NOTE — Telephone Encounter (Signed)
Refills sent to requesting pharmacy  

## 2015-11-21 NOTE — Telephone Encounter (Signed)
Patient requesting refill of  carbamazepine (TEGRETOL) 200 MG tablet Pharmacy: [facility] She said he has 1 refill left which they will pick up. She is r equesting new RX to be sent to pharmacy

## 2016-01-16 ENCOUNTER — Telehealth: Payer: Self-pay | Admitting: Neurology

## 2016-01-16 NOTE — Telephone Encounter (Signed)
In the past, converting this patient to generic carbamazepine has altered his seizure control and tolerance.  I would prefer to stay with brand-name in this patient. This medication may require prior authorization, I would doubt that the medication will be denied out right. I called the wife.  If no other options, retrial on generic carbamazepine can be done.

## 2016-01-16 NOTE — Telephone Encounter (Signed)
Pt's wife called to advise that [facility] has informed them that carbamazepine (TEGRETOL) 200 MG tablet will not be covered next yr. She said her oldest son is Programmer, multimedia to [employer] and advised that generic tegretol is ok but that it might not be the drug of choice. He advised that Dr Viona Gilmore @ could go to [email or web address].com/[email or web address] and see what he thought.

## 2016-01-16 NOTE — Telephone Encounter (Signed)
Dr Willis- please advise 

## 2016-01-30 NOTE — Telephone Encounter (Signed)
Pt's wife called to advise [facility] is requesting formulary exception for the brand tegretol and she was told once it is rec'd it will be approved.  She said the coverage for tegretol will end December 15 and she is wanting to get it done prior to that.

## 2016-01-31 ENCOUNTER — Telehealth: Payer: Self-pay | Admitting: Neurology

## 2016-01-31 NOTE — Telephone Encounter (Signed)
[id number] request form completed and faxed back to [facility] F # [id number].

## 2016-01-31 NOTE — Telephone Encounter (Signed)
Brand Tegretol 200 mg tab is approved for non-formulary exception effective 04/02/2016 through 04/01/2017 under Medicare Part D benefit.

## 2016-01-31 NOTE — Telephone Encounter (Signed)
Arian/ [facility] called requesting more information on the pt. For pa on carbamazepine (TEGRETOL) 200 MG tablet. Phone: [phone]. Request will also be faxed.

## 2016-02-03 NOTE — Telephone Encounter (Signed)
Pt's wife called in to check on PA. I advised her of authorization. She also asked for the pa # which I gave.

## 2016-04-25 ENCOUNTER — Encounter: Payer: Self-pay | Admitting: Family Medicine

## 2016-04-25 DIAGNOSIS — Z86010 Personal history of colonic polyps: Secondary | ICD-10-CM | POA: Insufficient documentation

## 2016-04-25 DIAGNOSIS — H2513 Age-related nuclear cataract, bilateral: Secondary | ICD-10-CM | POA: Insufficient documentation

## 2016-04-25 DIAGNOSIS — M7742 Metatarsalgia, left foot: Secondary | ICD-10-CM

## 2016-04-25 DIAGNOSIS — M7741 Metatarsalgia, right foot: Secondary | ICD-10-CM | POA: Insufficient documentation

## 2016-04-25 DIAGNOSIS — K219 Gastro-esophageal reflux disease without esophagitis: Secondary | ICD-10-CM | POA: Insufficient documentation

## 2016-04-25 DIAGNOSIS — G450 Vertebro-basilar artery syndrome: Secondary | ICD-10-CM | POA: Insufficient documentation

## 2016-04-25 DIAGNOSIS — M1712 Unilateral primary osteoarthritis, left knee: Secondary | ICD-10-CM | POA: Insufficient documentation

## 2016-04-25 DIAGNOSIS — M545 Low back pain, unspecified: Secondary | ICD-10-CM | POA: Insufficient documentation

## 2016-04-25 DIAGNOSIS — G43909 Migraine, unspecified, not intractable, without status migrainosus: Secondary | ICD-10-CM | POA: Insufficient documentation

## 2016-04-25 DIAGNOSIS — E790 Hyperuricemia without signs of inflammatory arthritis and tophaceous disease: Secondary | ICD-10-CM | POA: Insufficient documentation

## 2016-04-25 DIAGNOSIS — M109 Gout, unspecified: Secondary | ICD-10-CM | POA: Insufficient documentation

## 2016-04-26 ENCOUNTER — Encounter: Payer: Self-pay | Admitting: Family Medicine

## 2016-04-26 ENCOUNTER — Ambulatory Visit (INDEPENDENT_AMBULATORY_CARE_PROVIDER_SITE_OTHER): Payer: Medicare Other | Admitting: Family Medicine

## 2016-04-26 VITALS — BP 132/74 | HR 70 | Temp 97.7°F | Ht 69.0 in | Wt 249.2 lb

## 2016-04-26 DIAGNOSIS — E785 Hyperlipidemia, unspecified: Secondary | ICD-10-CM

## 2016-04-26 DIAGNOSIS — K219 Gastro-esophageal reflux disease without esophagitis: Secondary | ICD-10-CM

## 2016-04-26 DIAGNOSIS — M545 Low back pain, unspecified: Secondary | ICD-10-CM

## 2016-04-26 DIAGNOSIS — E790 Hyperuricemia without signs of inflammatory arthritis and tophaceous disease: Secondary | ICD-10-CM | POA: Diagnosis not present

## 2016-04-26 DIAGNOSIS — M1A00X0 Idiopathic chronic gout, unspecified site, without tophus (tophi): Secondary | ICD-10-CM

## 2016-04-26 DIAGNOSIS — M1712 Unilateral primary osteoarthritis, left knee: Secondary | ICD-10-CM | POA: Diagnosis not present

## 2016-04-26 DIAGNOSIS — G450 Vertebro-basilar artery syndrome: Secondary | ICD-10-CM

## 2016-04-26 DIAGNOSIS — G43009 Migraine without aura, not intractable, without status migrainosus: Secondary | ICD-10-CM

## 2016-04-26 DIAGNOSIS — Z860100 Personal history of colon polyps, unspecified: Secondary | ICD-10-CM

## 2016-04-26 DIAGNOSIS — M542 Cervicalgia: Secondary | ICD-10-CM | POA: Diagnosis not present

## 2016-04-26 DIAGNOSIS — M7742 Metatarsalgia, left foot: Secondary | ICD-10-CM

## 2016-04-26 DIAGNOSIS — G8929 Other chronic pain: Secondary | ICD-10-CM

## 2016-04-26 DIAGNOSIS — G40309 Generalized idiopathic epilepsy and epileptic syndromes, not intractable, without status epilepticus: Secondary | ICD-10-CM | POA: Diagnosis not present

## 2016-04-26 DIAGNOSIS — M7741 Metatarsalgia, right foot: Secondary | ICD-10-CM | POA: Diagnosis not present

## 2016-04-26 DIAGNOSIS — H2513 Age-related nuclear cataract, bilateral: Secondary | ICD-10-CM | POA: Diagnosis not present

## 2016-04-26 DIAGNOSIS — H8110 Benign paroxysmal vertigo, unspecified ear: Secondary | ICD-10-CM

## 2016-04-26 MED ORDER — MECLIZINE HCL 25 MG PO TABS: 25.0000 mg | ORAL_TABLET | Freq: Three times a day (TID) | ORAL | 0 refills | Status: DC | PRN

## 2016-04-26 MED ORDER — PREDNISONE 5 MG PO TABS: ORAL_TABLET | ORAL | 0 refills | Status: DC

## 2016-04-26 MED ORDER — HYDROCODONE-ACETAMINOPHEN 5-325 MG PO TABS: 1.0000 | ORAL_TABLET | Freq: Four times a day (QID) | ORAL | 0 refills | Status: DC | PRN
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
## 2016-04-26 NOTE — Progress Notes (Signed)
Pre visit review using our clinic review tool, if applicable. No additional management support is needed unless otherwise documented below in the visit note. 

## 2016-04-30 ENCOUNTER — Other Ambulatory Visit (INDEPENDENT_AMBULATORY_CARE_PROVIDER_SITE_OTHER): Payer: Medicare Other

## 2016-04-30 DIAGNOSIS — E785 Hyperlipidemia, unspecified: Secondary | ICD-10-CM | POA: Diagnosis not present

## 2016-04-30 DIAGNOSIS — E782 Mixed hyperlipidemia: Secondary | ICD-10-CM

## 2016-04-30 DIAGNOSIS — M5412 Radiculopathy, cervical region: Secondary | ICD-10-CM

## 2016-04-30 LAB — LIPID PANEL
Cholesterol: 206 mg/dL — ABNORMAL HIGH (ref 0–200)
HDL: 39.6 mg/dL (ref >39.00)
NonHDL: 165.9
Total CHOL/HDL Ratio: 5
Triglycerides: 270 mg/dL — ABNORMAL HIGH (ref 0.0–149.0)
VLDL: 54 mg/dL — ABNORMAL HIGH (ref 0.0–40.0)

## 2016-04-30 LAB — COMPREHENSIVE METABOLIC PANEL
ALT: 13 U/L (ref 0–53)
AST: 12 U/L (ref 0–37)
Albumin: 3.9 g/dL (ref 3.5–5.2)
Alkaline Phosphatase: 73 U/L (ref 39–117)
BUN: 17 mg/dL (ref 6–23)
CO2: 28 mEq/L (ref 19–32)
Calcium: 8.8 mg/dL (ref 8.4–10.5)
Chloride: 105 mEq/L (ref 96–112)
Creatinine, Ser: 0.94 mg/dL (ref 0.40–1.50)
GFR: 84.39 mL/min (ref >60.00)
Glucose, Bld: 109 mg/dL — ABNORMAL HIGH (ref 70–99)
Potassium: 4.5 mEq/L (ref 3.5–5.1)
Sodium: 141 mEq/L (ref 135–145)
Total Bilirubin: 0.3 mg/dL (ref 0.2–1.2)
Total Protein: 6.3 g/dL (ref 6.0–8.3)

## 2016-04-30 LAB — LDL CHOLESTEROL, DIRECT: Direct LDL: 119 mg/dL

## 2016-04-30 NOTE — Addendum Note (Signed)
Addended by: Frutoso Chase A on: 04/30/2016 08:11 AM   Modules accepted: Orders

## 2016-05-01 ENCOUNTER — Telehealth: Payer: Self-pay | Admitting: Family Medicine

## 2016-05-01 NOTE — Telephone Encounter (Signed)
Paperwork received from patient, successfully faxed to [place]

## 2016-05-01 NOTE — Telephone Encounter (Signed)
Paperwork location: Dr. Juleen China folder   Status: Patient records received as requested.

## 2016-05-02 ENCOUNTER — Telehealth: Payer: Self-pay | Admitting: Surgical

## 2016-05-02 NOTE — Telephone Encounter (Signed)
Spoke with patients wife about filling out forms to get assistance with cost of patients Tegretol. Patient is paying $75 monthly for this. I am going to put forms up front for patient to pick up.

## 2016-05-03 DIAGNOSIS — E785 Hyperlipidemia, unspecified: Secondary | ICD-10-CM | POA: Insufficient documentation

## 2016-05-03 DIAGNOSIS — M5412 Radiculopathy, cervical region: Secondary | ICD-10-CM | POA: Insufficient documentation

## 2016-05-04 ENCOUNTER — Encounter: Payer: Self-pay | Admitting: Surgical

## 2016-05-04 ENCOUNTER — Telehealth: Payer: Self-pay | Admitting: Surgical

## 2016-05-04 NOTE — Telephone Encounter (Signed)
Lm for patient to call back to clarify medication. They called back and I spoke with patients wife. Please see result note

## 2016-05-11 ENCOUNTER — Emergency Department (HOSPITAL_COMMUNITY)
Admission: EM | Admit: 2016-05-11 | Discharge: 2016-05-11 | Disposition: A | Discharge status: Home / Self Care | Payer: Medicare Other | Attending: Emergency Medicine | Admitting: Emergency Medicine

## 2016-05-11 ENCOUNTER — Encounter (HOSPITAL_COMMUNITY): Payer: Self-pay | Admitting: *Deleted

## 2016-05-11 ENCOUNTER — Emergency Department (HOSPITAL_COMMUNITY): Payer: Medicare Other

## 2016-05-11 DIAGNOSIS — N132 Hydronephrosis with renal and ureteral calculous obstruction: Secondary | ICD-10-CM | POA: Insufficient documentation

## 2016-05-11 DIAGNOSIS — Z87891 Personal history of nicotine dependence: Secondary | ICD-10-CM | POA: Insufficient documentation

## 2016-05-11 DIAGNOSIS — Z7982 Long term (current) use of aspirin: Secondary | ICD-10-CM | POA: Insufficient documentation

## 2016-05-11 DIAGNOSIS — R1031 Right lower quadrant pain: Secondary | ICD-10-CM | POA: Diagnosis not present

## 2016-05-11 DIAGNOSIS — N200 Calculus of kidney: Secondary | ICD-10-CM

## 2016-05-11 LAB — CBC
HEMATOCRIT: 47.1 % (ref 39.0–52.0)
Hemoglobin: 16.3 g/dL (ref 13.0–17.0)
MCH: 31.5 pg (ref 26.0–34.0)
MCHC: 34.6 g/dL (ref 30.0–36.0)
MCV: 91.1 fL (ref 78.0–100.0)
Platelets: 197 10*3/uL (ref 150–400)
RBC: 5.17 MIL/uL (ref 4.22–5.81)
RDW: 12.8 % (ref 11.5–15.5)
WBC: 7.5 10*3/uL (ref 4.0–10.5)

## 2016-05-11 LAB — COMPREHENSIVE METABOLIC PANEL
ALT: 18 U/L (ref 17–63)
ANION GAP: 12 (ref 5–15)
AST: 22 U/L (ref 15–41)
Albumin: 3.8 g/dL (ref 3.5–5.0)
Alkaline Phosphatase: 67 U/L (ref 38–126)
BILIRUBIN TOTAL: 0.6 mg/dL (ref 0.3–1.2)
BUN: 22 mg/dL — AB (ref 6–20)
CO2: 21 mmol/L — ABNORMAL LOW (ref 22–32)
Calcium: 9.1 mg/dL (ref 8.9–10.3)
Chloride: 104 mmol/L (ref 101–111)
Creatinine, Ser: 1.13 mg/dL (ref 0.61–1.24)
GFR calc Af Amer: >60 mL/min (ref >60)
Glucose, Bld: 144 mg/dL — ABNORMAL HIGH (ref 65–99)
POTASSIUM: 4.2 mmol/L (ref 3.5–5.1)
Sodium: 137 mmol/L (ref 135–145)
TOTAL PROTEIN: 7 g/dL (ref 6.5–8.1)

## 2016-05-11 LAB — URINALYSIS, ROUTINE W REFLEX MICROSCOPIC
BACTERIA UA: NONE SEEN
BILIRUBIN URINE: NEGATIVE
Glucose, UA: NEGATIVE mg/dL
Ketones, ur: NEGATIVE mg/dL
LEUKOCYTES UA: NEGATIVE
NITRITE: NEGATIVE
PH: 5 (ref 5.0–8.0)
Protein, ur: NEGATIVE mg/dL
SPECIFIC GRAVITY, URINE: 1.021 (ref 1.005–1.030)

## 2016-05-11 LAB — LIPASE, BLOOD: Lipase: 60 U/L — ABNORMAL HIGH (ref 11–51)

## 2016-05-11 MED ORDER — HYDROMORPHONE HCL 2 MG/ML IJ SOLN
0.5000 mg | Freq: Once | INTRAMUSCULAR | Status: AC
  Administered 2016-05-11: 0.5 mg via INTRAVENOUS
  Filled 2016-05-11: qty 1

## 2016-05-11 MED ORDER — KETOROLAC TROMETHAMINE 30 MG/ML IJ SOLN
30.0000 mg | Freq: Once | INTRAMUSCULAR | Status: AC
  Administered 2016-05-11: 30 mg via INTRAVENOUS
  Filled 2016-05-11: qty 1

## 2016-05-11 MED ORDER — IOPAMIDOL (ISOVUE-300) INJECTION 61%
INTRAVENOUS | Status: AC
  Administered 2016-05-11: 100 mL
  Filled 2016-05-11: qty 100

## 2016-05-11 MED ORDER — OXYCODONE-ACETAMINOPHEN 5-325 MG PO TABS: 2.0000 | ORAL_TABLET | ORAL | 0 refills | Status: DC | PRN

## 2016-05-11 MED ORDER — ONDANSETRON HCL 4 MG/2ML IJ SOLN
4.0000 mg | Freq: Once | INTRAMUSCULAR | Status: AC
  Administered 2016-05-11: 4 mg via INTRAVENOUS
  Filled 2016-05-11: qty 2

## 2016-05-11 MED ORDER — ONDANSETRON 4 MG PO TBDP: 4.0000 mg | ORAL_TABLET | Freq: Three times a day (TID) | ORAL | 0 refills | Status: DC | PRN

## 2016-05-11 MED ORDER — TAMSULOSIN HCL 0.4 MG PO CAPS: 0.4000 mg | ORAL_CAPSULE | Freq: Every day | ORAL | 0 refills | Status: DC

## 2016-05-11 NOTE — ED Notes (Signed)
Pt returned to room from CT

## 2016-05-11 NOTE — ED Notes (Signed)
EDP at bedside  

## 2016-05-11 NOTE — ED Triage Notes (Signed)
Patient stated he started with RLQ pain about 3am  Able to void without difficuty and had a normal BM  Denies N/V at this time

## 2016-05-11 NOTE — ED Provider Notes (Signed)
[facility] DEPT Provider Note   CSN: [id number] Arrival date & time: 05/11/16  [id number]     History   Chief Complaint Chief Complaint  Patient presents with  . Abdominal Pain    HPI Brandon Flores is a 71 y.o. male.  The history is provided by the patient. No language interpreter was used.  Abdominal Pain   This is a new problem. The problem occurs constantly. The problem has been gradually worsening. The pain is located in the RLQ. The pain is moderate. Pertinent negatives include fever. Nothing aggravates the symptoms. Nothing relieves the symptoms. Past workup does not include surgery.  Pt reports pain began around 3 am.  Pt reports he has had continued pain.  Pain is in right lower abdomen.    Past Medical History:  Diagnosis Date  . Cervical radiculopathy   . Colon polyp    (2006- adenoma and tubulovillous adenoma; 2010-adenomas; repeat 201)-Dr. Wynetta Emery  . Complex partial seizure disorder ([place]) 10/08/2014  . GERD (gastroesophageal reflux disease)   . Hypercholesterolemia   . Lateral epicondylitis    2002  . Migraine    headaches  . Ophthalmalgia    Dr. Gershon Crane  . Orthodontics    Dr. Junius Roads  . Partial epilepsy with impairment of consciousness ([place])   . Plantar fasciitis   . Seizure disorder ([place])    Dr. Sabra Heck ([facility])  . Seizures ([place])   . UTI (urinary tract infection)   . Vertebrobasilar insufficiency   . Vertigo     Patient Active Problem List   Diagnosis Date Noted  . Cervical radiculopathy 05/03/2016  . HLD (hyperlipidemia) 05/03/2016  . GERD (gastroesophageal reflux disease) 04/25/2016  . Hyperuricemia 04/25/2016  . Low back pain without sciatica 04/25/2016  . Degenerative arthritis of left knee 04/25/2016  . Metatarsalgia of both feet 04/25/2016  . Gout 04/25/2016  . Migraine headache 04/25/2016  . Nuclear sclerosis of both eyes 04/25/2016  . History of colonic polyps 04/25/2016  . VBI (vertebrobasilar insufficiency) 04/25/2016  . Vertigo 03/10/2014  .  Generalized convulsive epilepsy ([place]) 11/12/2013  . Choroidal nevus, right 10/12/2013  . Vitreous floaters 10/12/2013    Past Surgical History:  Procedure Laterality Date  . BACK SURGERY    . HEMORROIDECTOMY    . NECK SURGERY         Home Medications    Prior to Admission medications   Medication Sig Start Date End Date Taking? Authorizing Provider  allopurinol (ZYLOPRIM) 100 MG tablet Take 100 mg by mouth daily.  12/05/15  Yes Historical Provider, MD  aspirin 81 MG tablet Take 81 mg by mouth daily.   Yes Historical Provider, MD  atorvastatin (LIPITOR) 10 MG tablet Take 10 mg by mouth daily.  11/21/15  Yes Historical Provider, MD  carbamazepine (TEGRETOL) 200 MG tablet Take 2 tablets (400 mg total) by mouth 3 (three) times daily. Brand is medically necessary 11/21/15  Yes Kathrynn Ducking, MD  famotidine (PEPCID) 20 MG tablet Take 20 mg by mouth daily as needed for heartburn or indigestion (heartburn).   Yes Historical Provider, MD  HYDROcodone-acetaminophen (NORCO/VICODIN) 5-325 MG tablet Take 1 tablet by mouth every 6 (six) hours as needed for moderate pain. 04/26/16  Yes Briscoe Deutscher, DO  ibuprofen (ADVIL,MOTRIN) 200 MG tablet Take 200 mg by mouth every 6 (six) hours as needed for moderate pain.    Yes Historical Provider, MD  meclizine (ANTIVERT) 25 MG tablet Take 1 tablet (25 mg total) by mouth 3 (three) times daily  as needed for dizziness (dizziness). 04/26/16  Yes Briscoe Deutscher, DO    Family History Family History  Problem Relation Age of Onset  . Alcohol abuse Father   . COPD Sister   . Cancer Sister     lung  . Seizures Neg Hx     Social History Social History  Substance Use Topics  . Smoking status: Former Smoker    Quit date: 04/02/1986  . Smokeless tobacco: Never Used     Comment: Quit 1988, was a heavy smoker (20-30/day)  . Alcohol use No     Allergies   Patient has no known allergies.   Review of Systems Review of Systems  Constitutional: Negative for  fever.  Gastrointestinal: Positive for abdominal pain.  All other systems reviewed and are negative.    Physical Exam Updated Vital Signs BP 167/88   Pulse (!) 59   Temp 97.3 F (36.3 C) (Oral)   Resp 24   Ht 5\' 9"  (1.753 m)   Wt 112.9 kg   SpO2 98%   BMI 36.77 kg/m   Physical Exam  Constitutional: He appears well-developed and well-nourished.  HENT:  Head: Normocephalic.  Mouth/Throat: Oropharynx is clear and moist.  Eyes: Conjunctivae are normal. Pupils are equal, round, and reactive to light.  Cardiovascular: Normal rate and regular rhythm.   Pulmonary/Chest: Effort normal and breath sounds normal.  Abdominal: Soft.  Musculoskeletal: Normal range of motion.  Neurological: He is alert.  Skin: Skin is warm.  Psychiatric: He has a normal mood and affect.  Nursing note and vitals reviewed.    ED Treatments / Results  Labs (all labs ordered are listed, but only abnormal results are displayed) Labs Reviewed  LIPASE, BLOOD - Abnormal; Notable for the following:       Result Value   Lipase 60 (*)    All other components within normal limits  COMPREHENSIVE METABOLIC PANEL - Abnormal; Notable for the following:    CO2 21 (*)    Glucose, Bld 144 (*)    BUN 22 (*)    All other components within normal limits  URINALYSIS, ROUTINE W REFLEX MICROSCOPIC - Abnormal; Notable for the following:    Hgb urine dipstick MODERATE (*)    Squamous Epithelial / LPF 0-5 (*)    All other components within normal limits  CBC    EKG  EKG Interpretation None       Radiology Ct Abdomen Pelvis W Contrast  Result Date: 05/11/2016 CLINICAL DATA:  Right lower quadrant pain EXAM: CT ABDOMEN AND PELVIS WITH CONTRAST TECHNIQUE: Multidetector CT imaging of the abdomen and pelvis was performed using the standard protocol following bolus administration of intravenous contrast. CONTRAST:  181mL ISOVUE-300 IOPAMIDOL (ISOVUE-300) INJECTION 61% COMPARISON:  None. FINDINGS: Lower chest: Lung  bases are clear. No effusions. Heart is normal size. Hepatobiliary: Small gallstone in the gallbladder. No focal hepatic abnormality. Pancreas: No focal abnormality or ductal dilatation. Spleen: No focal abnormality.  Normal size. Adrenals/Urinary Tract: Mild right hydronephrosis. 3 mm mid right ureteral stone on image 65 and 4 mm distal right ureteral stone near the UVJ on image 87. Mild right perinephric stranding. No stones or hydronephrosis on the left. Adrenal glands and urinary bladder are unremarkable. Stomach/Bowel: Sigmoid diverticulosis. No active diverticulitis. Stomach and small bowel are decompressed, unremarkable. Vascular/Lymphatic: Scattered aortic calcifications. No aneurysm or adenopathy. Reproductive: Mild prostate prominence with central calcifications. Other: No free fluid or free air. Musculoskeletal: No acute bony abnormality or focal bone lesion. Postoperative changes  at L4-5. Sclerotic area within the L3 vertebral body, presumably bone island if there is no history of cancer. IMPRESSION: 3 mm mid right ureteral stone and 4 mm distal right ureteral stone. Mild right hydronephrosis and perinephric stranding. Sigmoid diverticulosis. Sclerotic to focus within the L3 vertebral body, presumably bone island if there is no clinical history for cancer. Correlation with clinical history and PSA values is recommended. If further evaluation is felt warranted, MRI may be beneficial. Cholelithiasis. Electronically Signed   By: Rolm Baptise M.D.   On: 05/11/2016 09:39    Procedures Procedures (including critical care time)  Medications Ordered in ED Medications  HYDROmorphone (DILAUDID) injection 0.5 mg (0.5 mg Intravenous Given 05/11/16 0751)  ondansetron (ZOFRAN) injection 4 mg (4 mg Intravenous Given 05/11/16 0750)  iopamidol (ISOVUE-300) 61 % injection (100 mLs  Contrast Given 05/11/16 0918)     Initial Impression / Assessment and Plan / ED Course  I have reviewed the triage vital signs and  the nursing notes.  Pertinent labs & imaging results that were available during my care of the patient were reviewed by me and considered in my medical decision making (see chart for details).     Follow up with urology for evaluation.   RX for flomax Rx for Percocet  Rx for Zofran   Final Clinical Impressions(s) / ED Diagnoses   Final diagnoses:  Kidney stone on right side    New Prescriptions New Prescriptions   ONDANSETRON (ZOFRAN ODT) 4 MG DISINTEGRATING TABLET    Take 1 tablet (4 mg total) by mouth every 8 (eight) hours as needed for nausea or vomiting.   OXYCODONE-ACETAMINOPHEN (PERCOCET/ROXICET) 5-325 MG TABLET    Take 2 tablets by mouth every 4 (four) hours as needed for severe pain.   TAMSULOSIN (FLOMAX) 0.4 MG CAPS CAPSULE    Take 1 capsule (0.4 mg total) by mouth daily.  An After Visit Summary was printed and given to the patient.   Hollace Kinnier [place], PA-C 05/11/16 [place], MD 05/12/16 [phone]

## 2016-05-21 DIAGNOSIS — N201 Calculus of ureter: Secondary | ICD-10-CM | POA: Diagnosis not present

## 2016-05-26 ENCOUNTER — Other Ambulatory Visit: Payer: Self-pay | Admitting: Sports Medicine

## 2016-05-28 NOTE — Telephone Encounter (Signed)
PCP Dr. Wallace  

## 2016-05-29 ENCOUNTER — Other Ambulatory Visit: Payer: Self-pay | Admitting: Family Medicine

## 2016-05-29 MED ORDER — ALLOPURINOL 100 MG PO TABS: 100.0000 mg | ORAL_TABLET | Freq: Every day | ORAL | 3 refills | Status: DC

## 2016-05-29 NOTE — Telephone Encounter (Signed)
RX has been refilled. Left message that RX has been sent to pharmacy

## 2016-05-29 NOTE — Telephone Encounter (Signed)
**  Remind patient they can make refill requests via MyChart**  Medication refill request (Name & Dosage): allopurinol (ZYLOPRIM) 100 MG tablet    Preferred pharmacy (Name & Address):  [place], [place] - [id number] [phone] (Phone) [phone] (Fax)      Other comments (if applicable):

## 2016-05-31 ENCOUNTER — Ambulatory Visit (INDEPENDENT_AMBULATORY_CARE_PROVIDER_SITE_OTHER): Payer: Medicare Other | Admitting: Adult Health

## 2016-05-31 ENCOUNTER — Encounter: Payer: Self-pay | Admitting: Adult Health

## 2016-05-31 VITALS — BP 138/82 | HR 57 | Resp 20 | Ht 65.0 in | Wt 241.0 lb

## 2016-05-31 DIAGNOSIS — R569 Unspecified convulsions: Secondary | ICD-10-CM | POA: Diagnosis not present

## 2016-05-31 DIAGNOSIS — Z5181 Encounter for therapeutic drug level monitoring: Secondary | ICD-10-CM | POA: Diagnosis not present

## 2016-05-31 MED ORDER — CARBAMAZEPINE 200 MG PO TABS: 400.0000 mg | ORAL_TABLET | Freq: Three times a day (TID) | ORAL | 4 refills | Status: DC

## 2016-05-31 NOTE — Progress Notes (Signed)
I have read the note, and I agree with the clinical assessment and plan.  WILLIS,CHARLES KEITH   

## 2016-05-31 NOTE — Progress Notes (Signed)
PATIENT: Brandon Flores DOB: January 06, 1946  REASON FOR VISIT: follow up- seizures HISTORY FROM: patient and wife  HISTORY OF PRESENT ILLNESS: Brandon Flores is a 71 year old male with a history of seizures. He returns today for follow-up. The patient is currently on Tegretol 400 mg 3 times a day. He reports that he is tolerating this well. He denies any seizure events. His wife states if he watches something on TV with a lot of flashing lights that we will trigger a "small seizure.". The patient is able to complete all ADLs independently. He operates a Teacher, music without difficulty. Denies any changes with his gait or balance. No change in mood. He continues to work as a Therapist, music. He returns today for an evaluation.  HISTORY 06/01/15: Brandon Flores is a 71 year old right-handed white male with a history of seizures that have been very well controlled. The patient has had 2 events over the last 30 years. The last event was about 27 years ago. The patient indicates that when he has used generic carbamazepine he has problems. The patient currently is on 400 mg 3 times daily of carbamazepine, he is tolerating the dose well. He is on brand-name drug. He will have occasional episodes that have a sensation as if a bag is being pulled over his head, the last such event was several months ago, he is only had one event in 6 months. The patient is operating a motor vehicle, he does well with this.  REVIEW OF SYSTEMS: Out of a complete 14 system review of symptoms, the patient complains only of the following symptoms, and all other reviewed systems are negative.  Joint pain, back pain, aching muscles, neck pain, hearing loss, excessive sweating, snoring  ALLERGIES: No Known Allergies  HOME MEDICATIONS: Outpatient Medications Prior to Visit  Medication Sig Dispense Refill  . allopurinol (ZYLOPRIM) 100 MG tablet Take 1 tablet (100 mg total) by mouth daily. 30 tablet 3  . aspirin 81 MG tablet Take 81 mg  by mouth daily.    Marland Kitchen atorvastatin (LIPITOR) 10 MG tablet Take 10 mg by mouth daily.     . famotidine (PEPCID) 20 MG tablet Take 20 mg by mouth daily as needed for heartburn or indigestion (heartburn).    Marland Kitchen HYDROcodone-acetaminophen (NORCO/VICODIN) 5-325 MG tablet Take 1 tablet by mouth every 6 (six) hours as needed for moderate pain. 20 tablet 0  . ibuprofen (ADVIL,MOTRIN) 200 MG tablet Take 200 mg by mouth every 6 (six) hours as needed for moderate pain.     . meclizine (ANTIVERT) 25 MG tablet Take 1 tablet (25 mg total) by mouth 3 (three) times daily as needed for dizziness (dizziness). 30 tablet 0  . ondansetron (ZOFRAN ODT) 4 MG disintegrating tablet Take 1 tablet (4 mg total) by mouth every 8 (eight) hours as needed for nausea or vomiting. 20 tablet 0  . oxyCODONE-acetaminophen (PERCOCET/ROXICET) 5-325 MG tablet Take 2 tablets by mouth every 4 (four) hours as needed for severe pain. 20 tablet 0  . tamsulosin (FLOMAX) 0.4 MG CAPS capsule Take 1 capsule (0.4 mg total) by mouth daily. 15 capsule 0  . carbamazepine (TEGRETOL) 200 MG tablet Take 2 tablets (400 mg total) by mouth 3 (three) times daily. Brand is medically necessary 540 tablet 1   No facility-administered medications prior to visit.     PAST MEDICAL HISTORY: Past Medical History:  Diagnosis Date  . Cervical radiculopathy   . Colon polyp    (2006- adenoma and tubulovillous adenoma;  2010-adenomas; repeat 201)-Dr. Wynetta Emery  . Complex partial seizure disorder ([place]) 10/08/2014  . GERD (gastroesophageal reflux disease)   . Hypercholesterolemia   . Lateral epicondylitis    2002  . Migraine    headaches  . Ophthalmalgia    Dr. Gershon Crane  . Orthodontics    Dr. Junius Roads  . Partial epilepsy with impairment of consciousness ([place])   . Plantar fasciitis   . Seizure disorder ([place])    Dr. Sabra Heck ([facility])  . Seizures ([facility])   . UTI (urinary tract infection)   . Vertebrobasilar insufficiency   . Vertigo     PAST SURGICAL HISTORY: Past  Surgical History:  Procedure Laterality Date  . BACK SURGERY    . HEMORROIDECTOMY    . NECK SURGERY      FAMILY HISTORY: Family History  Problem Relation Age of Onset  . Alcohol abuse Father   . COPD Sister   . Cancer Sister     lung  . Seizures Neg Hx     SOCIAL HISTORY: Social History   Social History  . Marital status: Married    Spouse name: Kizzie Furnish  . Number of children: 2  . Years of education: 13   Occupational History  . Service Manager     [employer]   Social History Main Topics  . Smoking status: Former Smoker    Quit date: 04/02/1986  . Smokeless tobacco: Never Used     Comment: Quit 1988, was a heavy smoker (20-30/day)  . Alcohol use No  . Drug use: No  . Sexual activity: Not on file   Other Topics Concern  . Not on file   Social History Narrative   Patient drinks about 4-5 sodas daily.   Patient is right handed.      PHYSICAL EXAM  Vitals:   05/31/16 0722  BP: 138/82  Pulse: (!) 57  Resp: 20  Weight: 241 lb (109.3 kg)  Height: 5\' 5"  (1.651 m)   Body mass index is 40.1 kg/m.  Generalized: Well developed, in no acute distress   Neurological examination  Mentation: Alert oriented to time, place, history taking. Follows all commands speech and language fluent Cranial nerve II-XII: Pupils were equal round reactive to light. Extraocular movements were full, visual field were full on confrontational test. Facial sensation and strength were normal. Uvula tongue midline. Head turning and shoulder shrug  were normal and symmetric. Motor: The motor testing reveals 5 over 5 strength of all 4 extremities. Good symmetric motor tone is noted throughout.  Sensory: Sensory testing is intact to soft touch on all 4 extremities. No evidence of extinction is noted.  Coordination: Cerebellar testing reveals good finger-nose-finger and heel-to-shin bilaterally.  Gait and station: Gait is normal. Tandem gait is normal. Romberg is  negative. No drift is seen.  Reflexes: Deep tendon reflexes are symmetric and normal bilaterally.   DIAGNOSTIC DATA (LABS, IMAGING, TESTING) - I reviewed patient records, labs, notes, testing and imaging myself where available.  Lab Results  Component Value Date   WBC 7.5 05/11/2016   HGB 16.3 05/11/2016   HCT 47.1 05/11/2016   MCV 91.1 05/11/2016   PLT 197 05/11/2016      Component Value Date/Time   NA 137 05/11/2016 0710   NA 144 10/08/2014 0920   K 4.2 05/11/2016 0710   CL 104 05/11/2016 0710   CO2 21 (L) 05/11/2016 0710   GLUCOSE 144 (H) 05/11/2016 0710   BUN 22 (H) 05/11/2016 0710   BUN  20 10/08/2014 0920   CREATININE 1.13 05/11/2016 0710   CALCIUM 9.1 05/11/2016 0710   PROT 7.0 05/11/2016 0710   PROT 6.6 10/08/2014 0920   ALBUMIN 3.8 05/11/2016 0710   ALBUMIN 4.1 10/08/2014 0920   AST 22 05/11/2016 0710   ALT 18 05/11/2016 0710   ALKPHOS 67 05/11/2016 0710   BILITOT 0.6 05/11/2016 0710   BILITOT 0.2 10/08/2014 0920   GFRNONAA >60 05/11/2016 0710   GFRAA >60 05/11/2016 0710   Lab Results  Component Value Date   CHOL 206 (H) 04/30/2016   HDL 39.60 04/30/2016   LDLDIRECT 119.0 04/30/2016   TRIG 270.0 (H) 04/30/2016   CHOLHDL 5 04/30/2016     ASSESSMENT AND PLAN 71 y.o. year old male  has a past medical history of Cervical radiculopathy; Colon polyp; Complex partial seizure disorder ([place]) (10/08/2014); GERD (gastroesophageal reflux disease); Hypercholesterolemia; Lateral epicondylitis; Migraine; Ophthalmalgia; Orthodontics; Partial epilepsy with impairment of consciousness ([place]); Plantar fasciitis; Seizure disorder ([place]); Seizures ([place]); UTI (urinary tract infection); Vertebrobasilar insufficiency; and Vertigo. here with:  1. Seizures  Overall the patient has done very well. He will continue on Tegretol. He does require brand-name only. I will check carbamazepine level today. I reviewed seizure precautions with the patient. He will follow-up in one year or sooner  if needed.  I spent 15 minutes with the patient 50% of this time was spent counseling the patient on seizure precautions and his medication.     Ward Givens, MSN, NP-C 05/31/2016, 8:22 AM [facility] [address] [place], [place] [address] [phone]

## 2016-05-31 NOTE — Patient Instructions (Signed)
Continue Tegretol  Blood work today If your symptoms worsen or you develop new symptoms please let us know.

## 2016-06-01 LAB — CARBAMAZEPINE LEVEL, TOTAL: CARBAMAZEPINE LVL: 11.5 ug/mL (ref 4.0–12.0)

## 2016-06-07 ENCOUNTER — Telehealth: Payer: Self-pay | Admitting: *Deleted

## 2016-06-07 NOTE — Telephone Encounter (Signed)
-----   Message from Ward Givens, NP sent at 06/01/2016  7:53 AM EST ----- Lab work normal. Please call patient.

## 2016-06-07 NOTE — Telephone Encounter (Signed)
Called and spoke to pt wife about normal labs per MM,NP note. Patient still at work and she is on [place].  She requested results be forwarded to PCP (he referred pt to our office). Advised  I will do this.  She requested copy. I advised they can see results on mychart if he is signed up (she declined saying she does not want to use mychart) or they can come to office to sign record release form. Cannot mail. She verbalized understanding.

## 2016-06-21 DIAGNOSIS — N201 Calculus of ureter: Secondary | ICD-10-CM | POA: Diagnosis not present

## 2016-07-24 DIAGNOSIS — S060X0A Concussion without loss of consciousness, initial encounter: Secondary | ICD-10-CM | POA: Diagnosis not present

## 2016-07-24 DIAGNOSIS — R51 Headache: Secondary | ICD-10-CM | POA: Diagnosis not present

## 2016-07-24 DIAGNOSIS — S239XXA Sprain of unspecified parts of thorax, initial encounter: Secondary | ICD-10-CM | POA: Diagnosis not present

## 2016-07-25 ENCOUNTER — Other Ambulatory Visit: Payer: Self-pay | Admitting: Nurse Practitioner

## 2016-07-25 ENCOUNTER — Ambulatory Visit
Admission: RE | Admit: 2016-07-25 | Discharge: 2016-07-25 | Disposition: A | Discharge status: Home / Self Care | Payer: Medicare Other | Source: Ambulatory Visit | Attending: Nurse Practitioner | Admitting: Nurse Practitioner

## 2016-07-25 DIAGNOSIS — R51 Headache: Secondary | ICD-10-CM | POA: Diagnosis not present

## 2016-07-25 DIAGNOSIS — S0990XA Unspecified injury of head, initial encounter: Secondary | ICD-10-CM | POA: Diagnosis not present

## 2016-07-25 DIAGNOSIS — S060X9A Concussion with loss of consciousness of unspecified duration, initial encounter: Secondary | ICD-10-CM

## 2016-07-25 DIAGNOSIS — W19XXXA Unspecified fall, initial encounter: Secondary | ICD-10-CM

## 2016-07-25 DIAGNOSIS — S060X0D Concussion without loss of consciousness, subsequent encounter: Secondary | ICD-10-CM | POA: Diagnosis not present

## 2016-07-30 DIAGNOSIS — I6523 Occlusion and stenosis of bilateral carotid arteries: Secondary | ICD-10-CM | POA: Diagnosis not present

## 2016-08-31 ENCOUNTER — Encounter: Payer: Self-pay | Admitting: Family Medicine

## 2016-08-31 ENCOUNTER — Ambulatory Visit (INDEPENDENT_AMBULATORY_CARE_PROVIDER_SITE_OTHER): Payer: Medicare Other | Admitting: Family Medicine

## 2016-08-31 VITALS — BP 142/74 | HR 69 | Temp 97.6°F | Ht 65.0 in | Wt 239.0 lb

## 2016-08-31 DIAGNOSIS — E669 Obesity, unspecified: Secondary | ICD-10-CM

## 2016-08-31 DIAGNOSIS — G8929 Other chronic pain: Secondary | ICD-10-CM | POA: Insufficient documentation

## 2016-08-31 DIAGNOSIS — E7800 Pure hypercholesterolemia, unspecified: Secondary | ICD-10-CM | POA: Diagnosis not present

## 2016-08-31 DIAGNOSIS — M542 Cervicalgia: Secondary | ICD-10-CM

## 2016-08-31 MED ORDER — HYDROCODONE-ACETAMINOPHEN 5-325 MG PO TABS: 1.0000 | ORAL_TABLET | Freq: Four times a day (QID) | ORAL | 0 refills | Status: DC | PRN

## 2016-08-31 NOTE — Progress Notes (Signed)
Brandon Flores is a 71 y.o. male is here for follow up.  History of Present Illness:   Brandon Flores CMA acting as scribe for Dr. Juleen Flores.  HPI Patient is coming in today for a follow up on medical issues. No concerns today.   1. Chronic neck pain. Current symptoms are intermittent stiffness, decreased ROM, aching. Patient denies radicular pain or UE numbness. Patient has had previous osteoarthritis of cervical spine. The patient notes controlled pain using prn NSAIDs, Tylenol, and Norco (used sparingly).    2. Obesity (BMI 30-39.9). Down 15 pounds after quitting soda. He is now drinking more water.    3. Pure hypercholesterolemia.   Is the patient taking medications without problems? [x]   YES  []   NO Does the patient complain of muscle aches?   []   YES  [x]    NO Trying to exercise on a regular basis? []   YES  [x]   NO Diet Compliance: compliant most of the time. Concerns: none. Cardiovascular ROS: no chest pain or dyspnea on exertion.   Lipids:    Component Value Date/Time   CHOL 206 (H) 04/30/2016 0811   TRIG 270.0 (H) 04/30/2016 0811   HDL 39.60 04/30/2016 0811   LDLDIRECT 119.0 04/30/2016 0811   VLDL 54.0 (H) 04/30/2016 0811   CHOLHDL 5 04/30/2016 0811     Health Maintenance Due  Topic Date Due  . PNA vac Low Risk Adult (1 of 2 - PCV13) 07/27/2011   PMHx, SurgHx, SocialHx, FamHx, Medications, and Allergies were reviewed in the Visit Navigator and updated as appropriate.   Patient Active Problem List   Diagnosis Date Noted  . Obesity (BMI 30-39.9) 08/31/2016  . Chronic neck pain 08/31/2016  . Cervical radiculopathy 05/03/2016  . HLD (hyperlipidemia) 05/03/2016  . GERD (gastroesophageal reflux disease) 04/25/2016  . Hyperuricemia 04/25/2016  . Low back pain without sciatica 04/25/2016  . Degenerative arthritis of left knee 04/25/2016  . Metatarsalgia of both feet 04/25/2016  . Gout 04/25/2016  . Migraine headache 04/25/2016  . Nuclear sclerosis of both eyes  04/25/2016  . History of colonic polyps 04/25/2016  . VBI (vertebrobasilar insufficiency) 04/25/2016  . Vertigo 03/10/2014  . Generalized convulsive epilepsy (Brandon Flores) 11/12/2013  . Choroidal nevus, right 10/12/2013  . Vitreous floaters 10/12/2013   Social History  Substance Use Topics  . Smoking status: Former Smoker    Quit date: 04/02/1986  . Smokeless tobacco: Never Used     Comment: Quit 1988, was a heavy smoker (20-30/day)  . Alcohol use No   Current Medications and Allergies:   .  allopurinol (ZYLOPRIM) 100 MG tablet, Take 1 tablet (100 mg total) by mouth daily., Disp: 30 tablet, Rfl: 3 .  aspirin 81 MG tablet, Take 81 mg by mouth daily., Disp: , Rfl:  .  atorvastatin (LIPITOR) 10 MG tablet, Take 10 mg by mouth daily. , Disp: , Rfl:  .  carbamazepine (TEGRETOL) 200 MG tablet, Take 2 tablets (400 mg total) by mouth 3 (three) times daily. Brand is medically necessary, Disp: 540 tablet, Rfl: 4 .  famotidine (PEPCID) 20 MG tablet, Take 20 mg by mouth daily as needed for heartburn or indigestion (heartburn)., Disp: , Rfl:  .  ibuprofen (ADVIL,MOTRIN) 200 MG tablet, Take 200 mg by mouth every 6 (six) hours as needed for moderate pain. , Disp: , Rfl:   No Known Allergies   Review of Systems   Review of Systems  Constitutional: Negative for chills, fever, malaise/fatigue and weight loss.  Respiratory:  Negative for cough, shortness of breath and wheezing.   Cardiovascular: Negative for chest pain, palpitations and leg swelling.  Gastrointestinal: Negative for abdominal pain, constipation, diarrhea, nausea and vomiting.  Genitourinary: Negative for dysuria and urgency.  Musculoskeletal: Negative for joint pain and myalgias.  Skin: Negative for rash.  Neurological: Negative for dizziness and headaches.  Psychiatric/Behavioral: Negative for depression, substance abuse and suicidal ideas. The patient is not nervous/anxious.    Vitals:   Vitals:   08/31/16 0754  BP: (!) 142/74  Pulse:  69  Temp: 97.6 F (36.4 C)  TempSrc: Oral  SpO2: 96%  Weight: 239 lb (108.4 kg)  Height: 5\' 5"  (1.651 m)     Body mass index is 39.77 kg/m.  Physical Exam:   Physical Exam  Constitutional: He is oriented to person, place, and time. He appears well-developed and well-nourished. No distress.  HENT:  Head: Normocephalic and atraumatic.  Right Ear: External ear normal.  Left Ear: External ear normal.  Nose: Nose normal.  Mouth/Throat: Oropharynx is clear and moist.  Eyes: Conjunctivae and EOM are normal. Pupils are equal, round, and reactive to light.  Neck: Normal range of motion. Neck supple.  Cardiovascular: Normal rate, regular rhythm, normal heart sounds and intact distal pulses.   Pulmonary/Chest: Effort normal and breath sounds normal.  Abdominal: Soft. Bowel sounds are normal.  Musculoskeletal: Normal range of motion.  Neurological: He is alert and oriented to person, place, and time.  Skin: Skin is warm and dry.  Psychiatric: He has a normal mood and affect. His behavior is normal. Judgment and thought content normal.  Nursing note and vitals reviewed.   Results for orders placed or performed in visit on 05/31/16  Carbamazepine level, total  Result Value Ref Range   Carbamazepine (Tegretol), S 11.5 4.0 - 12.0 ug/mL   Assessment and Plan:   Brandon Flores was seen today for follow-up.  Diagnoses and all orders for this visit:  Chronic neck pain Comments: Continue Motrin. Okay prn Norco as below to be used sparingly. Orders: -     HYDROcodone-acetaminophen (NORCO/VICODIN) 5-325 MG tablet; Take 1 tablet by mouth every 6 (six) hours as needed for moderate pain.  Obesity (BMI 30-39.9) Comments: I congratulated the patient for his 15 pound weight loss.  Pure hypercholesterolemia Comments: The patient is now on Lipitor and ASA. Due for recheck in a few months.     . Reviewed expectations re: course of current medical issues. . Discussed self-management of  symptoms. . Outlined signs and symptoms indicating need for more acute intervention. . Patient verbalized understanding and all questions were answered. Marland Kitchen Health Maintenance issues including appropriate healthy diet, exercise, and smoking avoidance were discussed with patient. . See orders for this visit as documented in the electronic medical record. . Patient received an After Visit Summary.  CMA served as Education administrator during this visit. History, Physical, and Plan performed by medical provider. The above documentation has been reviewed and is accurate and complete. Briscoe Deutscher, D.O.  Briscoe Deutscher, DO [place] 08/31/2016  Future Appointments Date Time Provider [place]  12/04/2016 3:30 PM Briscoe Deutscher, DO [facility] None  06/03/2017 7:30 AM Ward Givens, NP [facility] None

## 2016-10-07 ENCOUNTER — Other Ambulatory Visit: Payer: Self-pay | Admitting: Family Medicine

## 2016-10-08 ENCOUNTER — Other Ambulatory Visit: Payer: Self-pay | Admitting: Surgical

## 2016-10-08 MED ORDER — ALLOPURINOL 100 MG PO TABS
100.0000 mg | ORAL_TABLET | Freq: Every day | ORAL | 3 refills | Status: DC
Start: 2016-10-08 — End: 2019-02-13

## 2016-12-04 ENCOUNTER — Ambulatory Visit: Payer: Medicare Other | Admitting: Family Medicine

## 2016-12-17 NOTE — Progress Notes (Signed)
PCP notes:   Health maintenance: Flu - declined. Hep C Screening - with next lab draw.  Abnormal screenings: none.   Patient concerns: Cost of Tegretol.    Nurse concerns: none.  Next PCP appt: 12/18/16.

## 2016-12-17 NOTE — Progress Notes (Signed)
Pre visit review using our clinic review tool, if applicable. No additional management support is needed unless otherwise documented below in the visit note. 

## 2016-12-17 NOTE — Progress Notes (Addendum)
Subjective:   Brandon Flores is a 71 y.o. male who presents for Medicare Annual/Subsequent preventive examination.  Review of Systems:  No ROS.  Medicare Wellness Visit. Additional risk factors are reflected in the social history.  Cardiac Risk Factors include: advanced age (>65men, >71 women);male gender;dyslipidemia     Objective:    Vitals: BP 120/72 (BP Location: Left Arm, Patient Position: Sitting, Cuff Size: Normal)   Pulse 78   Temp 98 F (36.7 C) (Oral)   Resp 16   Ht 5\' 5"  (1.651 m)   Wt 230 lb 3.2 oz (104.4 kg)   SpO2 96%   BMI 38.31 kg/m   Body mass index is 38.31 kg/m.  Tobacco History  Smoking Status  . Former Smoker  . Packs/day: 3.00  . Years: 20.00  . Quit date: 04/02/1986  Smokeless Tobacco  . Never Used    Comment: Quit 1988, was a heavy smoker (20-30/day)     Counseling given: Not Answered   Past Medical History:  Diagnosis Date  . Cervical radiculopathy   . Colon polyp    (2006- adenoma and tubulovillous adenoma; 2010-adenomas; repeat 201)-Dr. Wynetta Emery  . Complex partial seizure disorder ([place]) 10/08/2014  . GERD (gastroesophageal reflux disease)   . Hypercholesterolemia   . Lateral epicondylitis    2002  . Migraine    headaches  . Ophthalmalgia    Dr. Gershon Crane  . Orthodontics    Dr. Junius Roads  . Partial epilepsy with impairment of consciousness ([place])   . Plantar fasciitis   . Seizure disorder ([place])    Dr. Sabra Heck ([facility])  . Seizures ([place])   . UTI (urinary tract infection)   . Vertebrobasilar insufficiency   . Vertigo    Past Surgical History:  Procedure Laterality Date  . BACK SURGERY    . HEMORROIDECTOMY    . NECK SURGERY     Family History  Problem Relation Age of Onset  . Alcohol abuse Father   . COPD Sister   . Cancer Sister        lung  . Seizures Neg Hx    History  Sexual Activity  . Sexual activity: Not on file    Outpatient Encounter Prescriptions as of 12/18/2016  Medication Sig  . allopurinol (ZYLOPRIM) 100 MG tablet  Take 1 tablet (100 mg total) by mouth daily.  Marland Kitchen aspirin 81 MG tablet Take 81 mg by mouth daily.  Marland Kitchen atorvastatin (LIPITOR) 10 MG tablet Take 10 mg by mouth daily.   . carbamazepine (TEGRETOL) 200 MG tablet Take 2 tablets (400 mg total) by mouth 3 (three) times daily. Brand is medically necessary  . famotidine (PEPCID) 20 MG tablet Take 20 mg by mouth daily as needed for heartburn or indigestion (heartburn).  Marland Kitchen ibuprofen (ADVIL,MOTRIN) 200 MG tablet Take 200 mg by mouth every 6 (six) hours as needed for moderate pain.   Marland Kitchen HYDROcodone-acetaminophen (NORCO/VICODIN) 5-325 MG tablet Take 1 tablet by mouth every 6 (six) hours as needed for moderate pain.   No facility-administered encounter medications on file as of 12/18/2016.     Activities of Daily Living In your present state of health, do you have any difficulty performing the following activities: 12/18/2016  Hearing? N  Vision? N  Difficulty concentrating or making decisions? N  Walking or climbing stairs? N  Dressing or bathing? N  Doing errands, shopping? N  Preparing Food and eating ? N  Using the Toilet? N  In the past six months, have you accidently leaked  urine? N  Do you have problems with loss of bowel control? N  Managing your Medications? N  Managing your Finances? N  Housekeeping or managing your Housekeeping? N  Some recent data might be hidden    Patient Care Team: Briscoe Deutscher, DO as PCP - General (Family Medicine) Kristeen Miss, MD as Consulting Physician (Neurosurgery) Kathrynn Ducking, MD as Consulting Physician (Neurology) Bond, Tracie Harrier, MD as Referring Physician (Ophthalmology)   Assessment:    Physical assessment deferred to PCP.  Exercise Activities and Dietary recommendations Current Exercise Habits: The patient has a physically strenous job, but has no regular exercise apart from work., Exercise limited by: None identified  Goals    None     Fall Risk Fall Risk  12/18/2016 04/26/2016 06/01/2015    Falls in the past year? Yes Yes No  Number falls in past yr: 1 1 -  Injury with Fall? Yes No -  Comment - Fell on snow -  Risk Factor Category  High Fall Risk - -  Follow up Education provided;Falls prevention discussed - -   Depression Screen PHQ 2/9 Scores 12/18/2016 04/26/2016  PHQ - 2 Score 0 0    Cognitive Function Ad8 score reviewed for issues:  Issues making decisions:no  Less interest in hobbies / activities:no  Repeats questions, stories (family complaining):no  Trouble using ordinary gadgets (microwave, computer, phone):no  Forgets the month or year: no  Mismanaging finances: no  Remembering appts:no  Daily problems with thinking and/or memory:no Ad8 score is=0         Immunization History  Administered Date(s) Administered  . Pneumococcal Conjugate-13 09/02/2013  . Pneumococcal Polysaccharide-23 08/29/2011  . Tdap 08/23/2010   Screening Tests Health Maintenance  Topic Date Due  . Hepatitis C Screening  06/03/2017 (Originally [birth year])  . INFLUENZA VACCINE  12/18/2017 (Originally 10/31/2016)  . COLONOSCOPY  12/03/2018  . TETANUS/TDAP  08/22/2020  . PNA vac Low Risk Adult  Completed      Plan:   Follow up with PCP as directed.  I have personally reviewed and noted the following in the patient's chart:   . Medical and social history . Use of alcohol, tobacco or illicit drugs  . Current medications and supplements . Functional ability and status . Nutritional status . Physical activity . Advanced directives . List of other physicians . Vitals . Screenings to include cognitive, depression, and falls . Referrals and appointments  In addition, I have reviewed and discussed with patient certain preventive protocols, quality metrics, and best practice recommendations. A written personalized care plan for preventive services as well as general preventive health recommendations were provided to patient.     Ree Edman,  RN  12/18/2016

## 2016-12-18 ENCOUNTER — Encounter: Payer: Self-pay | Admitting: Family Medicine

## 2016-12-18 ENCOUNTER — Ambulatory Visit (INDEPENDENT_AMBULATORY_CARE_PROVIDER_SITE_OTHER): Payer: Medicare Other | Admitting: Family Medicine

## 2016-12-18 ENCOUNTER — Encounter: Payer: Self-pay | Admitting: *Deleted

## 2016-12-18 ENCOUNTER — Ambulatory Visit: Payer: Medicare Other | Admitting: Family Medicine

## 2016-12-18 ENCOUNTER — Ambulatory Visit (INDEPENDENT_AMBULATORY_CARE_PROVIDER_SITE_OTHER): Payer: Medicare Other | Admitting: *Deleted

## 2016-12-18 VITALS — BP 120/72 | HR 78 | Temp 98.0°F | Ht 69.0 in | Wt 230.0 lb

## 2016-12-18 VITALS — BP 120/72 | HR 78 | Temp 98.0°F | Resp 16 | Ht 65.0 in | Wt 230.2 lb

## 2016-12-18 DIAGNOSIS — Z0000 Encounter for general adult medical examination without abnormal findings: Secondary | ICD-10-CM | POA: Diagnosis not present

## 2016-12-18 DIAGNOSIS — G8929 Other chronic pain: Secondary | ICD-10-CM | POA: Diagnosis not present

## 2016-12-18 DIAGNOSIS — M542 Cervicalgia: Secondary | ICD-10-CM | POA: Diagnosis not present

## 2016-12-18 MED ORDER — NAPROXEN-ESOMEPRAZOLE 500-20 MG PO TBEC: 1.0000 | DELAYED_RELEASE_TABLET | Freq: Two times a day (BID) | ORAL | 0 refills | Status: DC

## 2016-12-18 NOTE — Progress Notes (Signed)
Brandon Flores is a 71 y.o. male is here for follow up.  History of Present Illness:   Water quality scientist, CMA, acting as scribe for Dr. Juleen China.  HPI:  1. Chronic neck pain. Current symptoms are intermittent stiffness, decreased ROM, aching. Patient denies radicular pain or UE numbness. Patient has had previous osteoarthritis of cervical spine. The patient notes controlled pain using prn NSAIDs and Tylenol.     There are no preventive care reminders to display for this patient. Depression screen [facility] 2/9 12/18/2016 12/18/2016 04/26/2016  Decreased Interest 0 0 0  Down, Depressed, Hopeless 0 0 0  PHQ - 2 Score 0 0 0   PMHx, SurgHx, SocialHx, FamHx, Medications, and Allergies were reviewed in the Visit Navigator and updated as appropriate.   Patient Active Problem List   Diagnosis Date Noted  . Obesity (BMI 30-39.9) 08/31/2016  . Chronic neck pain 08/31/2016  . Cervical radiculopathy 05/03/2016  . HLD (hyperlipidemia) 05/03/2016  . GERD (gastroesophageal reflux disease) 04/25/2016  . Hyperuricemia 04/25/2016  . Low back pain without sciatica 04/25/2016  . Degenerative arthritis of left knee 04/25/2016  . Metatarsalgia of both feet 04/25/2016  . Gout 04/25/2016  . Migraine headache 04/25/2016  . Nuclear sclerosis of both eyes 04/25/2016  . History of colonic polyps 04/25/2016  . VBI (vertebrobasilar insufficiency) 04/25/2016  . Vertigo 03/10/2014  . Generalized convulsive epilepsy ([place]) 11/12/2013  . Choroidal nevus, right 10/12/2013  . Vitreous floaters 10/12/2013   Social History  Substance Use Topics  . Smoking status: Former Smoker    Packs/day: 3.00    Years: 20.00    Quit date: 04/02/1986  . Smokeless tobacco: Never Used     Comment: Quit 1988, was a heavy smoker (20-30/day)  . Alcohol use No   Current Medications and Allergies:   Current Outpatient Prescriptions:  .  allopurinol (ZYLOPRIM) 100 MG tablet, Take 1 tablet (100 mg total) by mouth daily., Disp: 30 tablet,  Rfl: 3 .  aspirin 81 MG tablet, Take 81 mg by mouth daily., Disp: , Rfl:  .  atorvastatin (LIPITOR) 10 MG tablet, Take 10 mg by mouth daily. , Disp: , Rfl:  .  carbamazepine (TEGRETOL) 200 MG tablet, Take 2 tablets (400 mg total) by mouth 3 (three) times daily. Brand is medically necessary, Disp: 540 tablet, Rfl: 4 .  famotidine (PEPCID) 20 MG tablet, Take 20 mg by mouth daily as needed for heartburn or indigestion (heartburn)., Disp: , Rfl:  .  ibuprofen (ADVIL,MOTRIN) 200 MG tablet, Take 200 mg by mouth every 6 (six) hours as needed for moderate pain. , Disp: , Rfl:   No Known Allergies   Review of Systems   Pertinent items are noted in the HPI. Otherwise, ROS is negative.  Vitals:   Vitals:   12/18/16 1615  BP: 120/72  Pulse: 78  Temp: 98 F (36.7 C)  TempSrc: Oral  SpO2: 96%  Weight: 230 lb (104.3 kg)  Height: 5\' 9"  (1.753 m)     Body mass index is 33.97 kg/m.   Physical Exam:   Physical Exam  Constitutional: He is oriented to person, place, and time. He appears well-developed and well-nourished. No distress.  HENT:  Head: Normocephalic and atraumatic.  Right Ear: External ear normal.  Left Ear: External ear normal.  Nose: Nose normal.  Mouth/Throat: Oropharynx is clear and moist.  Eyes: Pupils are equal, round, and reactive to light. Conjunctivae and EOM are normal.  Neck: Normal range of motion. Neck supple.  Cardiovascular: Normal rate, regular rhythm, normal heart sounds and intact distal pulses.   Pulmonary/Chest: Effort normal and breath sounds normal.  Abdominal: Soft. Bowel sounds are normal.  Musculoskeletal: Normal range of motion.  Neurological: He is alert and oriented to person, place, and time.  Skin: Skin is warm and dry.  Psychiatric: He has a normal mood and affect. His behavior is normal. Judgment and thought content normal.  Nursing note and vitals reviewed.   Results for orders placed or performed in visit on 05/31/16  Carbamazepine level,  total  Result Value Ref Range   Carbamazepine (Tegretol), S 11.5 4.0 - 12.0 ug/mL   Assessment and Plan:   Ayce was seen today for follow-up.  Diagnoses and all orders for this visit:  Chronic neck pain -     Naproxen-Esomeprazole (VIMOVO) 500-20 MG TBEC; Take 1 tablet by mouth 2 (two) times daily.   . Reviewed expectations re: course of current medical issues. . Discussed self-management of symptoms. . Outlined signs and symptoms indicating need for more acute intervention. . Patient verbalized understanding and all questions were answered. Marland Kitchen Health Maintenance issues including appropriate healthy diet, exercise, and smoking avoidance were discussed with patient. . See orders for this visit as documented in the electronic medical record. . Patient received an After Visit Summary.  CMA served as Education administrator during this visit. History, Physical, and Plan performed by medical provider. The above documentation has been reviewed and is accurate and complete. Briscoe Deutscher, D.O.  Briscoe Deutscher, DO [place] 12/20/2016  Future Appointments Date Time Provider [place]  06/03/2017 7:30 AM Ward Givens, NP [facility] None  12/23/2017 4:00 PM Stephanie Acre, RN [facility] None  12/30/2017 3:15 PM Briscoe Deutscher, DO [facility] None

## 2016-12-18 NOTE — Patient Instructions (Addendum)
Brandon Flores , Thank you for taking time to come for your Medicare Wellness Visit. I appreciate your ongoing commitment to your health goals. Please review the following plan we discussed and let me know if I can assist you in the future.    This is a list of the screening recommended for you and due dates:  Health Maintenance  Topic Date Due  .  Hepatitis C: One time screening is recommended by Center for Disease Control  (CDC) for  adults born from [birth year] through 1964.   06/03/2017*  . Flu Shot  12/18/2017*  . Colon Cancer Screening  12/03/2018  . Tetanus Vaccine  08/22/2020  . Pneumonia vaccines  Completed  *Topic was postponed. The date shown is not the original due date.   Preventive Care for Adults  A healthy lifestyle and preventive care can promote health and wellness. Preventive health guidelines for adults include the following key practices.  . A routine yearly physical is a good way to check with your health care provider about your health and preventive screening. It is a chance to share any concerns and updates on your health and to receive a thorough exam.  . Visit your dentist for a routine exam and preventive care every 6 months. Brush your teeth twice a day and floss once a day. Good oral hygiene prevents tooth decay and gum disease.  . The frequency of eye exams is based on your age, health, family medical history, use  of contact lenses, and other factors. Follow your health care provider's ecommendations for frequency of eye exams.  . Eat a healthy diet. Foods like vegetables, fruits, whole grains, low-fat dairy products, and lean protein foods contain the nutrients you need without too many calories. Decrease your intake of foods high in solid fats, added sugars, and salt. Eat the right amount of calories for you. Get information about a proper diet from your health care provider, if necessary.  . Regular physical exercise is one of the most important things you can do for  your health. Most adults should get at least 150 minutes of moderate-intensity exercise (any activity that increases your heart rate and causes you to sweat) each week. In addition, most adults need muscle-strengthening exercises on 2 or more days a week.  Silver Sneakers may be a benefit available to you. To determine eligibility, you may visit the website: www.silversneakers.com or contact program at 216-320-9292 Mon-Fri between 8AM-8PM.   . Maintain a healthy weight. The body mass index (BMI) is a screening tool to identify possible weight problems. It provides an estimate of body fat based on height and weight. Your health care provider can find your BMI and can help you achieve or maintain a healthy weight.   For adults 20 years and older: ? A BMI below 18.5 is considered underweight. ? A BMI of 18.5 to 24.9 is normal. ? A BMI of 25 to 29.9 is considered overweight. ? A BMI of 30 and above is considered obese.   . Maintain normal blood lipids and cholesterol levels by exercising and minimizing your intake of saturated fat. Eat a balanced diet with plenty of fruit and vegetables. Blood tests for lipids and cholesterol should begin at age 26 and be repeated every 5 years. If your lipid or cholesterol levels are high, you are over 50, or you are at high risk for heart disease, you may need your cholesterol levels checked more frequently. Ongoing high lipid and cholesterol levels should be treated  with medicines if diet and exercise are not working.  . If you smoke, find out from your health care provider how to quit. If you do not use tobacco, please do not start.  . If you choose to drink alcohol, please do not consume more than 2 drinks per day. One drink is considered to be 12 ounces (355 mL) of beer, 5 ounces (148 mL) of wine, or 1.5 ounces (44 mL) of liquor.  . If you are 51-65 years old, ask your health care provider if you should take aspirin to prevent strokes.  . Use sunscreen.  Apply sunscreen liberally and repeatedly throughout the day. You should seek shade when your shadow is shorter than you. Protect yourself by wearing long sleeves, pants, a wide-brimmed hat, and sunglasses year round, whenever you are outdoors.  . Once a month, do a whole body skin exam, using a mirror to look at the skin on your back. Tell your health care provider of new moles, moles that have irregular borders, moles that are larger than a pencil eraser, or moles that have changed in shape or color.

## 2016-12-18 NOTE — Patient Instructions (Signed)
CANADAPHARMACYONLINE [email or web address]

## 2016-12-19 NOTE — Progress Notes (Signed)
I have personally reviewed the Medicare Annual Wellness questionnaire and have noted 1. The patient's medical and social history 2. Their use of alcohol, tobacco or illicit drugs 3. Their current medications and supplements 4. The patient's functional ability including ADL's, fall risks, home safety risks and hearing or visual impairment. 5. Diet and physical activities 6. Evidence for depression or mood disorders 7. Reviewed Updated provider list, see scanned forms and [facility] Snapshot.   The patients weight, height, BMI and visual acuity have been recorded in the chart I have made referrals, counseling and provided education to the patient based review of the above and I have provided the pt with a written personalized care plan for preventive services.  I have provided the patient with a copy of your personalized plan for preventive services. Instructed to take the time to review along with their updated medication list.   Lucillie Kiesel, D.O. Family Medicine [facility]  

## 2016-12-25 ENCOUNTER — Telehealth: Payer: Self-pay | Admitting: Family Medicine

## 2016-12-25 DIAGNOSIS — M542 Cervicalgia: Principal | ICD-10-CM

## 2016-12-25 DIAGNOSIS — G8929 Other chronic pain: Secondary | ICD-10-CM

## 2016-12-25 NOTE — Telephone Encounter (Signed)
MEDICATION: Naproxen-Esomeprazole (VIMOVO) 500-20 MG TBEC  PHARMACY:   [place], [place] [address] [phone] (Phone) [phone] (Fax)    IS THIS A 90 DAY SUPPLY : N/A  IS PATIENT OUT OF MEDICATION: Y  IF NOT; HOW MUCH IS LEFT:   LAST APPOINTMENT DATE: 12/18/16  NEXT APPOINTMENT DATE: 12/30/17  OTHER COMMENTS: Patient's wife stated this sample of the  Rx was working for patient and he would like it prescribed    **Let patient know to contact pharmacy at the end of the day to make sure medication is ready. **  ** Please notify patient to allow 48-72 hours to process**  **Encourage patient to contact the pharmacy for refills or they can request refills through [facility]**

## 2016-12-25 NOTE — Telephone Encounter (Signed)
Is it ok to prescribe this.

## 2016-12-25 NOTE — Telephone Encounter (Signed)
Okay refill. Make sure to send to the pharmacy that Dr. Paulla Fore recommends.

## 2016-12-26 ENCOUNTER — Other Ambulatory Visit: Payer: Self-pay | Admitting: Surgical

## 2016-12-26 DIAGNOSIS — M542 Cervicalgia: Principal | ICD-10-CM

## 2016-12-26 DIAGNOSIS — G8929 Other chronic pain: Secondary | ICD-10-CM

## 2016-12-26 MED ORDER — NAPROXEN-ESOMEPRAZOLE 500-20 MG PO TBEC: 1.0000 | DELAYED_RELEASE_TABLET | Freq: Two times a day (BID) | ORAL | 2 refills | Status: DC

## 2016-12-26 NOTE — Telephone Encounter (Signed)
RX has been sent to [facility].

## 2016-12-26 NOTE — Telephone Encounter (Signed)
PA needed for PA for Naproxen-Esomeprazole (VIMOVO) 500-20 MG TBEC [[id number]] .  Ty,  -LL

## 2016-12-26 NOTE — Telephone Encounter (Signed)
We will wait for the paper PA.

## 2016-12-26 NOTE — Addendum Note (Signed)
Addended by: Durwin Glaze on: 12/26/2016 08:45 AM   Modules accepted: Orders

## 2016-12-27 NOTE — Telephone Encounter (Signed)
PA is in progress through [employer].  Waiting for insurance decision.

## 2016-12-31 ENCOUNTER — Telehealth: Payer: Self-pay | Admitting: Family Medicine

## 2016-12-31 NOTE — Telephone Encounter (Signed)
Patients wife [name] wants to know if patient can get another sample of:   Naproxen-Esomeprazole (VIMOVO) 500-20 MG TBEC   States that pharmacy told her that we had not provided the correct information for this to be covered under PA. Patient is going out of town on Friday. He has been out of meds for a week and she is concerned about him being out of meds longer.

## 2016-12-31 NOTE — Telephone Encounter (Signed)
Okay to give sample. Rx Naproxen and tell him to take his regular PPI.

## 2016-12-31 NOTE — Telephone Encounter (Signed)
Please advise.  Patient's insurance denied this medication.  It's not going to get approved under any circumstance.

## 2017-01-01 NOTE — Telephone Encounter (Signed)
Spoke with patient's wife, Brandon Flores, on 12/31/2016, at approximately 4:22 pm.  Started off apologizing that Brandon Flores insurance denied coverage of Vimovo.  Tried to explain that Dr. Juleen Flores would call in Naproxen for him that he could take along with his regular PPI (famotidine), but patient cut me off.  She began ranting at me that she was told by [facility] that our office did not provide the information needed so that the medication could be covered.  She said "are your people there just not trained or what"?  I told Brandon Flores that I completed the prior authorization myself and that all information that was asked for was provided.  I advised her that the medication is made of two components that can easily be prescribed and Dr. Juleen Flores would be happy to send in a prescription for the Naproxen for Brandon Flores and he could take that along with the famotidine he already takes.  She wanted to know what famotidine was and where I got that information from.  I told her it was the generic name for Pepcid and that it was on Brandon Flores medication list.  She said he doesn't take that medication all the time.  I told her that was ok, but if he wants to take the Naproxen, he can take the Pepcid with it so that his stomach will be protected.  She wanted to know if Dr. Paulla Flores could prescribe something for Brandon Flores because he used to be his orthopedic doctor.  I was unable to tell her that he would likely need an appointment for that.    Brandon Flores then began talking about a bill they received from a visit Brandon Flores had that was coded wrong.  She stated that it was coded as "worker's comp".  She said she was in our office once and spoke to a woman in the lobby named "Brandon Flores".  She said this woman "got on the internet" and told her she would look into it.  She said she also mentioned this to Dr. Juleen Flores, and Dr. Juleen Flores said she would look into it.  I explained that I did not have any knowledge of this but I would pass the  information along.  She said this whole thing has been very stressful and she has had to go to [facility] to get her neck and back worked on due to stress.  I again tried to apologize, but she continued to talk over me.  She repeatedly blamed our office for her husband continuing to be in pain.  She repeated that it was our fault he could not get the Vimovo medication.  I again tried to explain Dr. Juleen Flores could send in a prescription but she continued to talk over me.  She stated she was ready to just go back to their previous doctor's office.  She then said she was getting too stressed and had to hang up.  The call was disconnected.  Throughout the call, I tried to reassure her and give options to help Brandon Flores.  Despite those efforts, Brandon Flores continued to talk over me and I don't feel that I was able to offer her any help in this situation.

## 2017-01-01 NOTE — Telephone Encounter (Signed)
This abuse of our staff has become a pattern and needs to be addressed. Please help me with this.

## 2017-01-21 DIAGNOSIS — H2513 Age-related nuclear cataract, bilateral: Secondary | ICD-10-CM | POA: Diagnosis not present

## 2017-01-21 DIAGNOSIS — H0102A Squamous blepharitis right eye, upper and lower eyelids: Secondary | ICD-10-CM | POA: Diagnosis not present

## 2017-01-21 DIAGNOSIS — H0102B Squamous blepharitis left eye, upper and lower eyelids: Secondary | ICD-10-CM | POA: Diagnosis not present

## 2017-01-21 DIAGNOSIS — D3131 Benign neoplasm of right choroid: Secondary | ICD-10-CM | POA: Diagnosis not present

## 2017-01-21 DIAGNOSIS — H43392 Other vitreous opacities, left eye: Secondary | ICD-10-CM | POA: Diagnosis not present

## 2017-02-09 DIAGNOSIS — J0141 Acute recurrent pansinusitis: Secondary | ICD-10-CM | POA: Diagnosis not present

## 2017-03-20 DIAGNOSIS — Z0000 Encounter for general adult medical examination without abnormal findings: Secondary | ICD-10-CM | POA: Diagnosis not present

## 2017-03-20 DIAGNOSIS — Z1389 Encounter for screening for other disorder: Secondary | ICD-10-CM | POA: Diagnosis not present

## 2017-03-20 DIAGNOSIS — E7800 Pure hypercholesterolemia, unspecified: Secondary | ICD-10-CM | POA: Diagnosis not present

## 2017-03-20 DIAGNOSIS — R569 Unspecified convulsions: Secondary | ICD-10-CM | POA: Diagnosis not present

## 2017-03-20 DIAGNOSIS — E669 Obesity, unspecified: Secondary | ICD-10-CM | POA: Diagnosis not present

## 2017-03-28 DIAGNOSIS — R05 Cough: Secondary | ICD-10-CM | POA: Diagnosis not present

## 2017-03-28 DIAGNOSIS — J069 Acute upper respiratory infection, unspecified: Secondary | ICD-10-CM | POA: Diagnosis not present

## 2017-04-11 DIAGNOSIS — R05 Cough: Secondary | ICD-10-CM | POA: Diagnosis not present

## 2017-04-18 DIAGNOSIS — R197 Diarrhea, unspecified: Secondary | ICD-10-CM | POA: Diagnosis not present

## 2017-04-18 DIAGNOSIS — K5909 Other constipation: Secondary | ICD-10-CM | POA: Diagnosis not present

## 2017-04-18 DIAGNOSIS — G40909 Epilepsy, unspecified, not intractable, without status epilepticus: Secondary | ICD-10-CM | POA: Diagnosis not present

## 2017-04-18 DIAGNOSIS — R1084 Generalized abdominal pain: Secondary | ICD-10-CM | POA: Diagnosis not present

## 2017-04-24 DIAGNOSIS — K5909 Other constipation: Secondary | ICD-10-CM | POA: Diagnosis not present

## 2017-04-24 DIAGNOSIS — Z1211 Encounter for screening for malignant neoplasm of colon: Secondary | ICD-10-CM | POA: Diagnosis not present

## 2017-06-03 ENCOUNTER — Ambulatory Visit (INDEPENDENT_AMBULATORY_CARE_PROVIDER_SITE_OTHER): Payer: Medicare Other | Admitting: Adult Health

## 2017-06-03 ENCOUNTER — Encounter: Payer: Self-pay | Admitting: Adult Health

## 2017-06-03 VITALS — BP 127/74 | HR 68 | Ht 69.0 in | Wt 241.0 lb

## 2017-06-03 DIAGNOSIS — R569 Unspecified convulsions: Secondary | ICD-10-CM

## 2017-06-03 DIAGNOSIS — Z5181 Encounter for therapeutic drug level monitoring: Secondary | ICD-10-CM

## 2017-06-03 MED ORDER — CARBAMAZEPINE 200 MG PO TABS: 400.0000 mg | ORAL_TABLET | Freq: Three times a day (TID) | ORAL | 4 refills | Status: DC

## 2017-06-03 NOTE — Progress Notes (Signed)
PATIENT: Brandon Flores DOB: Jun 17, 1945  REASON FOR VISIT: follow up HISTORY FROM: patient  HISTORY OF PRESENT ILLNESS: Today 06/03/17 Brandon Flores is a 72 year old male with a history of seizures.  He returns today for follow-up.  He remains on Tegretol taking 400 mg 3 times a day.  Reports that he tolerates the medication well.  Denies any seizure events.  Denies any changes with his gait or balance.  He continues to work full-time.  He operates a Teacher, music without difficulty.  Denies any new neurological symptoms.  HISTORY 05/31/16: Brandon Flores is a 72 year old male with a history of seizures. He returns today for follow-up. The patient is currently on Tegretol 400 mg 3 times a day. He reports that he is tolerating this well. He denies any seizure events. His wife states if he watches something on TV with a lot of flashing lights that we will trigger a "small seizure.". The patient is able to complete all ADLs independently. He operates a Teacher, music without difficulty. Denies any changes with his gait or balance. No change in mood. He continues to work as a Therapist, music. He returns today for an evaluation.  REVIEW OF SYSTEMS: Out of a complete 14 system review of symptoms, the patient complains only of the following symptoms, and all other reviewed systems are negative.  See HPI  ALLERGIES: No Known Allergies  HOME MEDICATIONS: Outpatient Medications Prior to Visit  Medication Sig Dispense Refill  . allopurinol (ZYLOPRIM) 100 MG tablet Take 1 tablet (100 mg total) by mouth daily. 30 tablet 3  . atorvastatin (LIPITOR) 10 MG tablet Take 10 mg by mouth daily.     . carbamazepine (TEGRETOL) 200 MG tablet Take 2 tablets (400 mg total) by mouth 3 (three) times daily. Brand is medically necessary 540 tablet 4  . famotidine (PEPCID) 20 MG tablet Take 20 mg by mouth daily as needed for heartburn or indigestion (heartburn).    Marland Kitchen ibuprofen (ADVIL,MOTRIN) 200 MG tablet Take 200 mg by  mouth every 6 (six) hours as needed for moderate pain.     Marland Kitchen OVER THE COUNTER MEDICATION Stool softener with probiotic    . aspirin 81 MG tablet Take 81 mg by mouth daily.    Marland Kitchen HYDROcodone-acetaminophen (NORCO/VICODIN) 5-325 MG tablet Take 1 tablet by mouth every 6 (six) hours as needed for moderate pain. 20 tablet 0  . Naproxen-Esomeprazole (VIMOVO) 500-20 MG TBEC Take 1 tablet by mouth 2 (two) times daily. 60 tablet 2   No facility-administered medications prior to visit.     PAST MEDICAL HISTORY: Past Medical History:  Diagnosis Date  . Cervical radiculopathy   . Colon polyp    (2006- adenoma and tubulovillous adenoma; 2010-adenomas; repeat 201)-Dr. Wynetta Emery  . Complex partial seizure disorder ([place]) 10/08/2014  . GERD (gastroesophageal reflux disease)   . Hypercholesterolemia   . Lateral epicondylitis    2002  . Migraine    headaches  . Ophthalmalgia    Dr. Gershon Crane  . Orthodontics    Dr. Junius Roads  . Partial epilepsy with impairment of consciousness ([place])   . Plantar fasciitis   . Seizure disorder ([place])    Dr. Sabra Heck ([facility])  . Seizures ([place])   . UTI (urinary tract infection)   . Vertebrobasilar insufficiency   . Vertigo     PAST SURGICAL HISTORY: Past Surgical History:  Procedure Laterality Date  . BACK SURGERY    . HEMORROIDECTOMY    . NECK SURGERY  FAMILY HISTORY: Family History  Problem Relation Age of Onset  . Alcohol abuse Father   . COPD Sister   . Cancer Sister        lung  . Seizures Neg Hx     SOCIAL HISTORY: Social History   Socioeconomic History  . Marital status: Married    Spouse name: Kizzie Furnish  . Number of children: 2  . Years of education: 85  . Highest education level: Not on file  Social Needs  . Financial resource strain: Not on file  . Food insecurity - worry: Not on file  . Food insecurity - inability: Not on file  . Transportation needs - medical: Not on file  . Transportation needs - non-medical: Not on file    Occupational History  . Occupation: Therapist, nutritional    Comment: [employer]  Tobacco Use  . Smoking status: Former Smoker    Packs/day: 3.00    Years: 20.00    Pack years: 60.00    Last attempt to quit: 04/02/1986    Years since quitting: 31.1  . Smokeless tobacco: Never Used  . Tobacco comment: Quit 1988, was a heavy smoker (20-30/day)  Substance and Sexual Activity  . Alcohol use: No  . Drug use: No  . Sexual activity: Not on file  Other Topics Concern  . Not on file  Social History Narrative   Occasional glass of caffeine   Patient is right handed.   Lives at home with his wife   They have 6 children together and 15 grandchildren      PHYSICAL EXAM  Vitals:   06/03/17 0721  BP: 127/74  Pulse: 68  Weight: 241 lb (109.3 kg)  Height: 5\' 9"  (1.753 m)   Body mass index is 35.59 kg/m.  Generalized: Well developed, in no acute distress   Neurological examination  Mentation: Alert oriented to time, place, history taking. Follows all commands speech and language fluent Cranial nerve II-XII: Pupils were equal round reactive to light. Extraocular movements were full, visual field were full on confrontational test. Facial sensation and strength were normal. Uvula tongue midline. Head turning and shoulder shrug  were normal and symmetric. Motor: The motor testing reveals 5 over 5 strength of all 4 extremities. Good symmetric motor tone is noted throughout.  Sensory: Sensory testing is intact to soft touch on all 4 extremities. No evidence of extinction is noted.  Coordination: Cerebellar testing reveals good finger-nose-finger and heel-to-shin bilaterally.  Gait and station: Gait is normal. Reflexes: Deep tendon reflexes are symmetric and normal bilaterally.   DIAGNOSTIC DATA (LABS, IMAGING, TESTING) - I reviewed patient records, labs, notes, testing and imaging myself where available.  Lab Results  Component Value Date   WBC 7.5 05/11/2016   HGB 16.3  05/11/2016   HCT 47.1 05/11/2016   MCV 91.1 05/11/2016   PLT 197 05/11/2016      Component Value Date/Time   NA 137 05/11/2016 0710   NA 144 10/08/2014 0920   K 4.2 05/11/2016 0710   CL 104 05/11/2016 0710   CO2 21 (L) 05/11/2016 0710   GLUCOSE 144 (H) 05/11/2016 0710   BUN 22 (H) 05/11/2016 0710   BUN 20 10/08/2014 0920   CREATININE 1.13 05/11/2016 0710   CALCIUM 9.1 05/11/2016 0710   PROT 7.0 05/11/2016 0710   PROT 6.6 10/08/2014 0920   ALBUMIN 3.8 05/11/2016 0710   ALBUMIN 4.1 10/08/2014 0920   AST 22 05/11/2016 0710   ALT 18 05/11/2016 0710  ALKPHOS 67 05/11/2016 0710   BILITOT 0.6 05/11/2016 0710   BILITOT 0.2 10/08/2014 0920   GFRNONAA >60 05/11/2016 0710   GFRAA >60 05/11/2016 0710   Lab Results  Component Value Date   CHOL 206 (H) 04/30/2016   HDL 39.60 04/30/2016   LDLDIRECT 119.0 04/30/2016   TRIG 270.0 (H) 04/30/2016   CHOLHDL 5 04/30/2016      ASSESSMENT AND PLAN 72 y.o. year old male  has a past medical history of Cervical radiculopathy, Colon polyp, Complex partial seizure disorder ([place]) (10/08/2014), GERD (gastroesophageal reflux disease), Hypercholesterolemia, Lateral epicondylitis, Migraine, Ophthalmalgia, Orthodontics, Partial epilepsy with impairment of consciousness ([place]), Plantar fasciitis, Seizure disorder ([place]), Seizures ([place]), UTI (urinary tract infection), Vertebrobasilar insufficiency, and Vertigo. here with :  1.  Seizures  Overall the patient is doing well.  He will continue on Tegretol 400 mg 3 times a day.  I will check blood work today.  He is advised that if he has any seizure events he should let us know.  He will follow-up in 6 months or sooner if needed.  I spent 15 minutes with the patient. 50% of this time was spent discussing his medication     Ward Givens, MSN, NP-C 06/03/2017, 7:28 AM [facility] [address] [place], [place] [phone]

## 2017-06-03 NOTE — Patient Instructions (Signed)
Your Plan:  Continue Tegretol  Blood work today If your symptoms worsen or you develop new symptoms please let us know.   Thank you for coming to see [place] at [facility]. I hope we have been able to provide you high quality care today.  You may receive a patient satisfaction survey over the next few weeks. We would appreciate your feedback and comments so that we may continue to improve ourselves and the health of our patients.

## 2017-06-03 NOTE — Progress Notes (Signed)
I have read the note, and I agree with the clinical assessment and plan.  Christasia Angeletti K Elisse Pennick   

## 2017-06-04 LAB — CBC WITH DIFFERENTIAL/PLATELET
Basophils Absolute: 0 10*3/uL (ref 0.0–0.2)
Basos: 0 %
EOS (ABSOLUTE): 0.2 10*3/uL (ref 0.0–0.4)
Eos: 3 %
HEMATOCRIT: 44.9 % (ref 37.5–51.0)
Hemoglobin: 15.3 g/dL (ref 13.0–17.7)
IMMATURE GRANULOCYTES: 0 %
Immature Grans (Abs): 0 10*3/uL (ref 0.0–0.1)
LYMPHS ABS: 1.5 10*3/uL (ref 0.7–3.1)
Lymphs: 28 %
MCH: 31.9 pg (ref 26.6–33.0)
MCHC: 34.1 g/dL (ref 31.5–35.7)
MCV: 94 fL (ref 79–97)
MONOS ABS: 0.6 10*3/uL (ref 0.1–0.9)
Monocytes: 11 %
NEUTROS PCT: 58 %
Neutrophils Absolute: 3 10*3/uL (ref 1.4–7.0)
PLATELETS: 173 10*3/uL (ref 150–379)
RBC: 4.79 x10E6/uL (ref 4.14–5.80)
RDW: 13.5 % (ref 12.3–15.4)
WBC: 5.2 10*3/uL (ref 3.4–10.8)

## 2017-06-04 LAB — COMPREHENSIVE METABOLIC PANEL
A/G RATIO: 1.4 (ref 1.2–2.2)
ALBUMIN: 3.8 g/dL (ref 3.5–4.8)
ALK PHOS: 84 IU/L (ref 39–117)
ALT: 13 IU/L (ref 0–44)
AST: 12 IU/L (ref 0–40)
BUN / CREAT RATIO: 26 — AB (ref 10–24)
BUN: 23 mg/dL (ref 8–27)
Bilirubin Total: <0.2 mg/dL (ref 0.0–1.2)
CHLORIDE: 106 mmol/L (ref 96–106)
CO2: 21 mmol/L (ref 20–29)
Calcium: 8.7 mg/dL (ref 8.6–10.2)
Creatinine, Ser: 0.9 mg/dL (ref 0.76–1.27)
GFR calc Af Amer: 100 mL/min/{1.73_m2} (ref >59)
GFR calc non Af Amer: 86 mL/min/{1.73_m2} (ref >59)
GLOBULIN, TOTAL: 2.7 g/dL (ref 1.5–4.5)
Glucose: 114 mg/dL — ABNORMAL HIGH (ref 65–99)
POTASSIUM: 3.9 mmol/L (ref 3.5–5.2)
SODIUM: 144 mmol/L (ref 134–144)
Total Protein: 6.5 g/dL (ref 6.0–8.5)

## 2017-06-04 LAB — CARBAMAZEPINE LEVEL, TOTAL: Carbamazepine (Tegretol), S: 12.5 ug/mL (ref 4.0–12.0)

## 2017-06-05 ENCOUNTER — Telehealth: Payer: Self-pay | Admitting: *Deleted

## 2017-06-05 NOTE — Telephone Encounter (Signed)
Spoke with wife, on [place] (patient was at work), informed her his lab work is relatively unremarkable. His Carbamazepine level is slightly elevated, however this was not a trough level. Advised wife he had no signs of clinical toxicity on his physical exam. Advised he call for any problems or concerns. She verbalized understanding, appreciation of call.

## 2017-06-12 ENCOUNTER — Ambulatory Visit (INDEPENDENT_AMBULATORY_CARE_PROVIDER_SITE_OTHER): Payer: Medicare Other

## 2017-06-12 ENCOUNTER — Encounter (INDEPENDENT_AMBULATORY_CARE_PROVIDER_SITE_OTHER): Payer: Self-pay | Admitting: Orthopaedic Surgery

## 2017-06-12 ENCOUNTER — Ambulatory Visit (INDEPENDENT_AMBULATORY_CARE_PROVIDER_SITE_OTHER): Payer: Medicare Other | Admitting: Orthopaedic Surgery

## 2017-06-12 DIAGNOSIS — M25552 Pain in left hip: Secondary | ICD-10-CM

## 2017-06-12 DIAGNOSIS — M25562 Pain in left knee: Secondary | ICD-10-CM | POA: Diagnosis not present

## 2017-06-12 DIAGNOSIS — M25561 Pain in right knee: Secondary | ICD-10-CM

## 2017-06-12 DIAGNOSIS — G8929 Other chronic pain: Secondary | ICD-10-CM | POA: Diagnosis not present

## 2017-06-12 NOTE — Progress Notes (Signed)
Office Visit Note   Patient: Brandon Flores           Date of Birth: [birth year]           MRN: [id number] Visit Date: 06/12/2017              Requested by: Lavone Orn, MD [address] [place], [place] [address] PCP: Lavone Orn, MD   Assessment & Plan: Visit Diagnoses:  1. Chronic pain of both knees   2. Pain in left hip     Plan: I do feel that he is a candidate for steroid injection in both knees since there is only mild arthritic changes.  I showed her quad strengthening exercises well and he tolerated steroid injections well.  He is a perfect candidate for hyaluronic acid as well.  We will see him back in a month to see if that is appropriate to place in his knees at this time.  We can always inject steroid around the [facility] joint at that visit if needed.   Follow-Up Instructions: Return in about 4 weeks (around 07/10/2017).   Orders:  Orders Placed This Encounter  Procedures  . Large Joint Inj  . XR HIP UNILAT W OR W/O PELVIS 1V LEFT  . XR Knee 1-2 Views Left  . XR Knee 1-2 Views Right   No orders of the defined types were placed in this encounter.     Procedures: Large Joint Inj: bilateral knee on 06/12/2017 11:05 AM Indications: diagnostic evaluation and pain Details: 22 G 1.5 in needle, superolateral approach  Arthrogram: No  Outcome: tolerated well, no immediate complications Procedure, treatment alternatives, risks and benefits explained, specific risks discussed. Consent was given by the patient. Immediately prior to procedure a time out was called to verify the correct patient, procedure, equipment, support staff and site/side marked as required. Patient was prepped and draped in the usual sterile fashion.       Clinical Data: No additional findings.   Subjective: Chief Complaint  Patient presents with  . Right Knee - Pain  . Left Knee - Pain  The patient is someone I am seeing for the first time but is a well-established patient of  the practice.  He comes in with bilateral knee pain for about a year now but no locking catching.  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  He is also had pain in the base of both of his thumbs and he thought it was carpal tunnel but I think this is more of CMC joint arthritis.  He is not a diabetic.  HPI  Review of Systems He currently denies any headache, chest pain, shortness of breath, fever, chills, nausea, vomiting.  Objective: Vital Signs: There were no vitals taken for this visit.  Physical Exam He is alert and oriented x3 and in no acute distress Ortho Exam Examination of both knees show no effusion.  Both knees have full range of motion with no significant crepitation.  Both knees are strong.  Both knees are ligamentously stable.  He does have significant pain with grind test on the base of his thumbs worse on the left than the right.  He has no symptoms or clinical signs of carpal tunnel syndrome.  His hands are well perfused. Specialty Comments:  No specialty comments available.  Imaging: Xr Hip Unilat W Or W/o Pelvis 1v Left  Result Date: 06/12/2017 An AP pelvis and lateral left hip shows no significant arthritic changes at all in either hip.  There are no acute findings otherwise.  The joint space is well-maintained.  Xr Knee 1-2 Views Left  Result Date: 06/12/2017 2 views of the left knee show no acute findings.  The alignment is well-maintained.  The joint space is well-maintained.  There is mild patellofemoral arthritic changes.  Xr Knee 1-2 Views Right  Result Date: 06/12/2017 2 views of the right knee show mild patellofemoral arthritic changes but otherwise no effusion and no acute findings.  The joint space and alignment are well-maintained.    PMFS History: Patient Active Problem List   Diagnosis Date Noted  . Obesity (BMI 30-39.9) 08/31/2016  . Chronic neck  pain 08/31/2016  . Cervical radiculopathy 05/03/2016  . HLD (hyperlipidemia) 05/03/2016  . GERD (gastroesophageal reflux disease) 04/25/2016  . Hyperuricemia 04/25/2016  . Low back pain without sciatica 04/25/2016  . Degenerative arthritis of left knee 04/25/2016  . Metatarsalgia of both feet 04/25/2016  . Gout 04/25/2016  . Migraine headache 04/25/2016  . Nuclear sclerosis of both eyes 04/25/2016  . History of colonic polyps 04/25/2016  . VBI (vertebrobasilar insufficiency) 04/25/2016  . Vertigo 03/10/2014  . Generalized convulsive epilepsy ([place]) 11/12/2013  . Choroidal nevus, right 10/12/2013  . Vitreous floaters 10/12/2013   Past Medical History:  Diagnosis Date  . Cervical radiculopathy   . Colon polyp    (2006- adenoma and tubulovillous adenoma; 2010-adenomas; repeat 201)-Dr. Wynetta Emery  . Complex partial seizure disorder ([place]) 10/08/2014  . GERD (gastroesophageal reflux disease)   . Hypercholesterolemia   . Lateral epicondylitis    2002  . Migraine    headaches  . Ophthalmalgia    Dr. Gershon Crane  . Orthodontics    Dr. Junius Roads  . Partial epilepsy with impairment of consciousness ([place])   . Plantar fasciitis   . Seizure disorder ([place])    Dr. Sabra Heck ([facility])  . Seizures ([place])   . UTI (urinary tract infection)   . Vertebrobasilar insufficiency   . Vertigo     Family History  Problem Relation Age of Onset  . Alcohol abuse Father   . COPD Sister   . Cancer Sister        lung  . Seizures Neg Hx     Past Surgical History:  Procedure Laterality Date  . BACK SURGERY    . HEMORROIDECTOMY    . NECK SURGERY     Social History   Occupational History  . Occupation: Therapist, nutritional    Comment: [employer]  Tobacco Use  . Smoking status: Former Smoker    Packs/day: 3.00    Years: 20.00    Pack years: 60.00    Last attempt to quit: 04/02/1986    Years since quitting: 31.2  . Smokeless tobacco: Never Used  . Tobacco comment: Quit 1988, was a heavy smoker  (20-30/day)  Substance and Sexual Activity  . Alcohol use: No  . Drug use: No  . Sexual activity: Not on file

## 2017-06-19 ENCOUNTER — Telehealth (INDEPENDENT_AMBULATORY_CARE_PROVIDER_SITE_OTHER): Payer: Self-pay

## 2017-06-19 NOTE — Telephone Encounter (Signed)
Talked with patient's wife Adela Lank and advised her that patient is covered for Monovisc Inj., bilateral knee and we can [place] and Bill.  Patient has appt.scheduled for 07/15/17.

## 2017-07-15 ENCOUNTER — Encounter (INDEPENDENT_AMBULATORY_CARE_PROVIDER_SITE_OTHER): Payer: Self-pay | Admitting: Orthopaedic Surgery

## 2017-07-15 ENCOUNTER — Ambulatory Visit (INDEPENDENT_AMBULATORY_CARE_PROVIDER_SITE_OTHER): Payer: Medicare Other | Admitting: Orthopaedic Surgery

## 2017-07-15 DIAGNOSIS — M1712 Unilateral primary osteoarthritis, left knee: Secondary | ICD-10-CM | POA: Diagnosis not present

## 2017-07-15 DIAGNOSIS — M1711 Unilateral primary osteoarthritis, right knee: Secondary | ICD-10-CM | POA: Diagnosis not present

## 2017-07-15 NOTE — Progress Notes (Signed)
   Procedure Note  Patient: Brandon Flores             Date of Birth: 12/18/1945           MRN: [id number]             Visit Date: 07/15/2017  Procedures: Visit Diagnoses: Unilateral primary osteoarthritis, left knee  Unilateral primary osteoarthritis, right knee  Large Joint Inj: bilateral knee on 07/15/2017 4:44 PM Indications: pain and diagnostic evaluation Details: 22 G 1.5 in needle, superolateral approach  Arthrogram: No  Outcome: tolerated well, no immediate complications Procedure, treatment alternatives, risks and benefits explained, specific risks discussed. Consent was given by the patient. Immediately prior to procedure a time out was called to verify the correct patient, procedure, equipment, support staff and site/side marked as required. Patient was prepped and draped in the usual sterile fashion.    The patient is here for scheduled Monovisc injections in both his knees.  He has known osteoarthritis of both his knees.  He has had steroid injection in both his knees and this did help.  The next step is trying hyaluronic acid in his knees.  He understands this fully.  He tolerated these injections well.  We will see him back as needed.  He understands he can always have steroid injections in mid summer if needed.  All questions concerns were answered and addressed.

## 2017-08-08 ENCOUNTER — Ambulatory Visit (INDEPENDENT_AMBULATORY_CARE_PROVIDER_SITE_OTHER): Payer: Medicare Other | Admitting: Orthopaedic Surgery

## 2017-08-08 ENCOUNTER — Encounter (INDEPENDENT_AMBULATORY_CARE_PROVIDER_SITE_OTHER): Payer: Self-pay | Admitting: Orthopaedic Surgery

## 2017-08-08 DIAGNOSIS — M25561 Pain in right knee: Secondary | ICD-10-CM | POA: Diagnosis not present

## 2017-08-08 DIAGNOSIS — G8929 Other chronic pain: Secondary | ICD-10-CM

## 2017-08-08 NOTE — Progress Notes (Signed)
The patient comes in today with continued right knee pain.  He is been having now locking catching in his right knee and feeling symptoms of instability of the knee especially going up and down stairs.  X-rays of his right knee show still well-maintained joint space but there is calcifications around the medial lateral meniscus especially posterior suggesting chronic tearing.  He is worked on activity modification as well as quad strengthening exercises.  We had a home exercise program for him as well.  We have tried an intra-articular steroid injection in his right knee as well as hyaluronic acid injection with Monovisc.  He does also wear a knee sleeve.  He is also been on anti-inflammatories.  On examination of his right knee he has a positive Murray sign to the medial side of his knee.  His Lockman's exam is negative.  His range of motion is full when she started past 90 degrees of flexion as well as pain in the posterior aspect of his knee at the  popliteal fossa.  Given his mechanical symptoms and given the failure of conservative treatment with his right knee and MRI is warranted to rule out meniscal tear.  We will see him back in follow-up after the MRI is obtained.

## 2017-08-14 ENCOUNTER — Ambulatory Visit
Admission: RE | Admit: 2017-08-14 | Discharge: 2017-08-14 | Disposition: A | Discharge status: Home / Self Care | Payer: Medicare Other | Source: Ambulatory Visit | Attending: Orthopaedic Surgery | Admitting: Orthopaedic Surgery

## 2017-08-14 DIAGNOSIS — G8929 Other chronic pain: Secondary | ICD-10-CM

## 2017-08-14 DIAGNOSIS — M25561 Pain in right knee: Principal | ICD-10-CM

## 2017-08-22 ENCOUNTER — Ambulatory Visit (INDEPENDENT_AMBULATORY_CARE_PROVIDER_SITE_OTHER): Payer: Medicare Other | Admitting: Orthopaedic Surgery

## 2017-08-22 ENCOUNTER — Encounter (INDEPENDENT_AMBULATORY_CARE_PROVIDER_SITE_OTHER): Payer: Self-pay | Admitting: Orthopaedic Surgery

## 2017-08-22 DIAGNOSIS — G8929 Other chronic pain: Secondary | ICD-10-CM | POA: Diagnosis not present

## 2017-08-22 DIAGNOSIS — M25561 Pain in right knee: Secondary | ICD-10-CM

## 2017-08-22 NOTE — Progress Notes (Signed)
The patient is following up after having an MRI of his right knee.  He has tried and failed conservative treatment with a steroid injection and a gel shot.  This is worked on his left knee but on his right knee.  He says it still hurts when he stepping up on things.  It hurts more with stairs.  On exam he has patellofemoral pain but the remainder of his exam is normal.  MRI also shows significantly normal knee except for patellofemoral arthritic changes.  I told him the MRI report and showed him a copy of the report.  I explained with him on with his knee and I do feel that it is best benefit would be losing weight and working on quad strengthening exercises.  There is not a surgical intervention he needs and he understands this as well.  All questions concerns were answered and addressed.  He will follow-up as needed.

## 2017-10-11 ENCOUNTER — Other Ambulatory Visit: Payer: Self-pay | Admitting: Family Medicine

## 2017-10-26 DIAGNOSIS — M62838 Other muscle spasm: Secondary | ICD-10-CM | POA: Diagnosis not present

## 2017-10-26 DIAGNOSIS — M545 Low back pain: Secondary | ICD-10-CM | POA: Diagnosis not present

## 2017-10-26 DIAGNOSIS — M546 Pain in thoracic spine: Secondary | ICD-10-CM | POA: Diagnosis not present

## 2017-10-30 DIAGNOSIS — M545 Low back pain: Secondary | ICD-10-CM | POA: Diagnosis not present

## 2017-10-30 DIAGNOSIS — M546 Pain in thoracic spine: Secondary | ICD-10-CM | POA: Diagnosis not present

## 2017-11-13 ENCOUNTER — Ambulatory Visit (INDEPENDENT_AMBULATORY_CARE_PROVIDER_SITE_OTHER): Payer: Medicare Other | Admitting: Family Medicine

## 2017-11-14 ENCOUNTER — Encounter (INDEPENDENT_AMBULATORY_CARE_PROVIDER_SITE_OTHER): Payer: Self-pay | Admitting: Family Medicine

## 2017-11-14 ENCOUNTER — Ambulatory Visit (INDEPENDENT_AMBULATORY_CARE_PROVIDER_SITE_OTHER): Payer: Medicare Other | Admitting: Family Medicine

## 2017-11-14 DIAGNOSIS — M79642 Pain in left hand: Secondary | ICD-10-CM | POA: Diagnosis not present

## 2017-11-14 DIAGNOSIS — M545 Low back pain, unspecified: Secondary | ICD-10-CM

## 2017-11-14 DIAGNOSIS — M79641 Pain in right hand: Secondary | ICD-10-CM | POA: Diagnosis not present

## 2017-11-14 DIAGNOSIS — G8929 Other chronic pain: Secondary | ICD-10-CM

## 2017-11-14 DIAGNOSIS — M25561 Pain in right knee: Secondary | ICD-10-CM

## 2017-11-14 MED ORDER — DICLOFENAC SODIUM 1 % TD GEL: 4.0000 g | Freq: Four times a day (QID) | TRANSDERMAL | 6 refills | Status: DC | PRN

## 2017-11-14 NOTE — Patient Instructions (Signed)
    Glucosamine sulfate 1,000-1,500 mg twice daily  Turmeric capsules twice daily

## 2017-11-14 NOTE — Progress Notes (Signed)
Office Visit Note   Patient: Brandon Flores           Date of Birth: 05/30/1945           MRN: [id number] Visit Date: 11/14/2017 Requested by: Lavone Orn, MD [address] [place], [place] [address] PCP: Lavone Orn, MD  Subjective: Chief Complaint  Patient presents with  . Left Knee - Pain  . Right Knee - Pain    HPI: He is here to reestablish care.  He has a history of chronic bilateral knee pain.  Left knee improved with injection of the right knee has not improved despite having cortisone and hyaluronic acid injections.  Pain is mostly on the anterior lateral aspect.  Both of his thumb is hurt at the [facility] joints.  Pain and slight weakness with grip.  He has chronic spine problems.  He had lumbar fusion several years ago with pretty good improvement.  His previous PCP put him on Lipitor about a year ago.  He thinks it might make his pain is worse.                 ROS: Otherwise noncontributory.  Objective: Vital Signs: There were no vitals taken for this visit.  Physical Exam:  Back: Lumbar spine is nontender over the spinous processes but he does have some pain to the left of midline at about L3-4 in the paraspinous muscle.  Straight leg raise negative, lower extremity strength and reflexes normal. Hands: Positive CMC grind test in both thumbs. Right knee: 1+ patellofemoral crepitus, tender lateral patellofemoral joint.  Pain with patellar compression.  No significant medial or lateral joint line tenderness.  Imaging: He brought thoracolumbar x-rays in on CD and I reviewed those today, he has probable DISH syndrome with diffuse degenerative disc disease.   Assessment & Plan: 1.  Chronic right knee pain most likely due to patellofemoral arthritis. -He will try Voltaren gel, glucosamine, and turmeric. -Emphasized the importance of weight loss.  2.  Chronic low back pain  3.  Chronic bilateral hand pain most likely due to thumb CMC  arthrosis -Treatment as above.   Follow-Up Instructions: No follow-ups on file.     Procedures: No procedures performed   PMFS History: Patient Active Problem List   Diagnosis Date Noted  . Obesity (BMI 30-39.9) 08/31/2016  . Chronic neck pain 08/31/2016  . Cervical radiculopathy 05/03/2016  . HLD (hyperlipidemia) 05/03/2016  . GERD (gastroesophageal reflux disease) 04/25/2016  . Hyperuricemia 04/25/2016  . Low back pain without sciatica 04/25/2016  . Degenerative arthritis of left knee 04/25/2016  . Metatarsalgia of both feet 04/25/2016  . Gout 04/25/2016  . Migraine headache 04/25/2016  . Nuclear sclerosis of both eyes 04/25/2016  . History of colonic polyps 04/25/2016  . VBI (vertebrobasilar insufficiency) 04/25/2016  . Vertigo 03/10/2014  . Generalized convulsive epilepsy ([place]) 11/12/2013  . Choroidal nevus, right 10/12/2013  . Vitreous floaters 10/12/2013   Past Medical History:  Diagnosis Date  . Cervical radiculopathy   . Colon polyp    (2006- adenoma and tubulovillous adenoma; 2010-adenomas; repeat 201)-Dr. Wynetta Emery  . Complex partial seizure disorder ([place]) 10/08/2014  . GERD (gastroesophageal reflux disease)   . Hypercholesterolemia   . Lateral epicondylitis    2002  . Migraine    headaches  . Ophthalmalgia    Dr. Gershon Crane  . Orthodontics    Dr. Junius Roads  . Partial epilepsy with impairment of consciousness ([place])   . Plantar fasciitis   .  Seizure disorder ([place])    Dr. Sabra Heck ([place])  . Seizures ([place])   . UTI (urinary tract infection)   . Vertebrobasilar insufficiency   . Vertigo     Family History  Problem Relation Age of Onset  . Alcohol abuse Father   . COPD Sister   . Cancer Sister        lung  . Seizures Neg Hx     Past Surgical History:  Procedure Laterality Date  . BACK SURGERY    . HEMORROIDECTOMY    . NECK SURGERY     Social History   Occupational History  . Occupation: Therapist, nutritional    Comment: [employer]  Tobacco Use   . Smoking status: Former Smoker    Packs/day: 3.00    Years: 20.00    Pack years: 60.00    Last attempt to quit: 04/02/1986    Years since quitting: 31.6  . Smokeless tobacco: Never Used  . Tobacco comment: Quit 1988, was a heavy smoker (20-30/day)  Substance and Sexual Activity  . Alcohol use: No  . Drug use: No  . Sexual activity: Not on file

## 2017-11-17 IMAGING — CT CT ABD-PELV W/ CM
2 of 5 series · 16 of 46 positions shown, 18 images · IV contrast (Omni 300)
Comparison: None.

CLINICAL DATA: Right lower quadrant pain

EXAM:
CT ABDOMEN AND PELVIS WITH CONTRAST
TECHNIQUE: Multidetector CT imaging of the abdomen and pelvis was performed
using the standard protocol following bolus administration of
intravenous contrast.
CONTRAST:  100mL 6G2KXX-LOO IOPAMIDOL (6G2KXX-LOO) INJECTION 61%

[Series 2: a/p w/ 5mm · axial · 0.86mm/px · z∈[+674,+1139]mm · 13 of 105 slices shown, 15 images]
[im 6/105  soft-tissue]
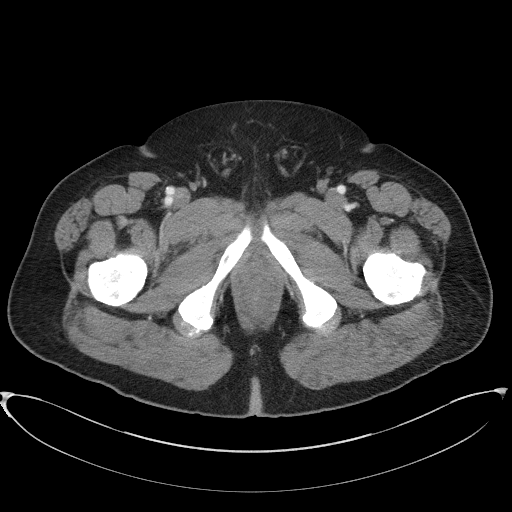
[im 6/105  bone]
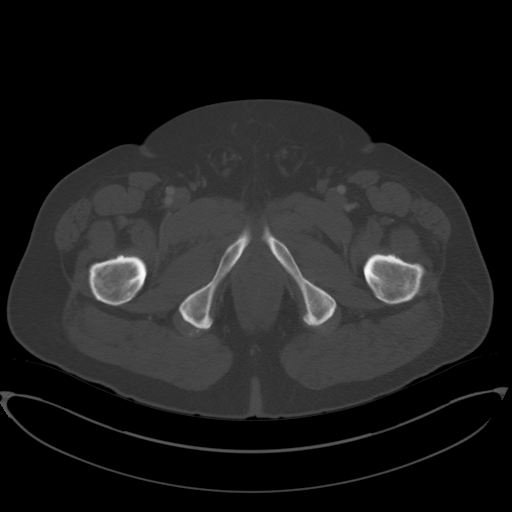
[im 17/105  soft-tissue]
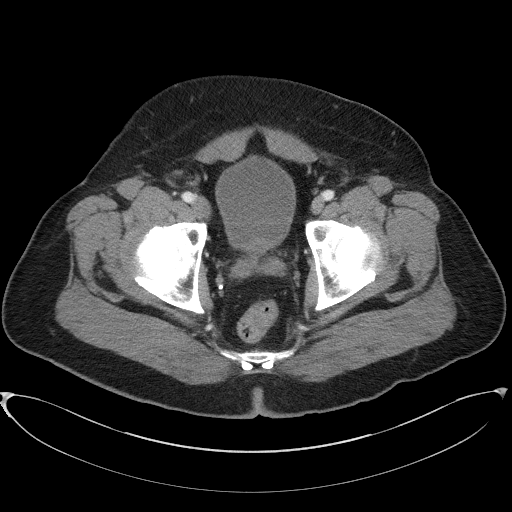
[im 22/105  soft-tissue]
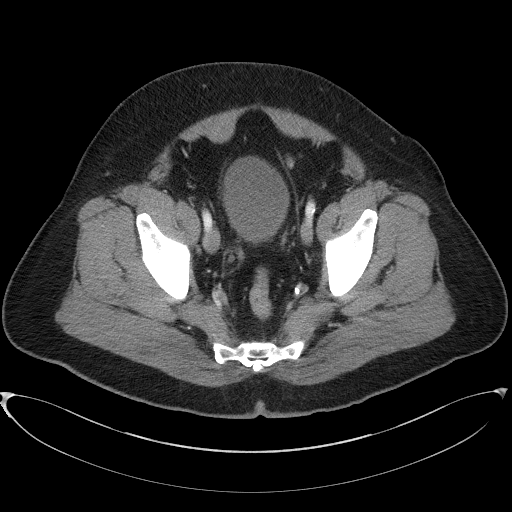
[im 28/105  soft-tissue]
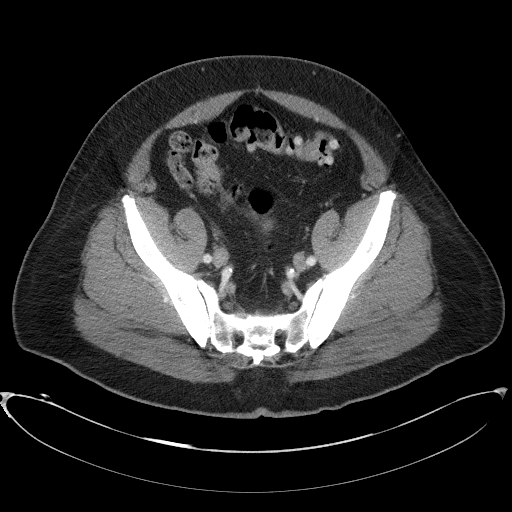
[im 39/105  soft-tissue]
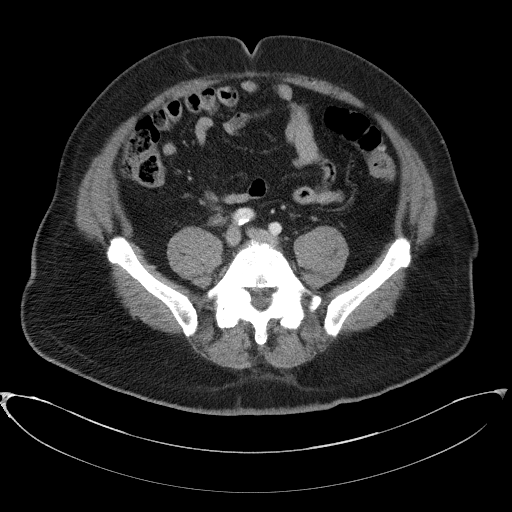
[im 44/105  soft-tissue]
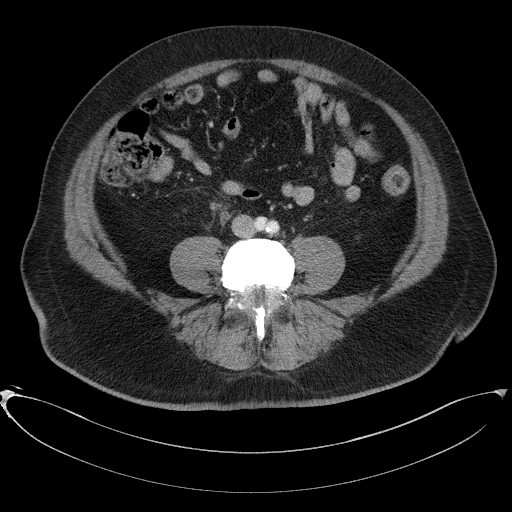
[im 55/105  soft-tissue]
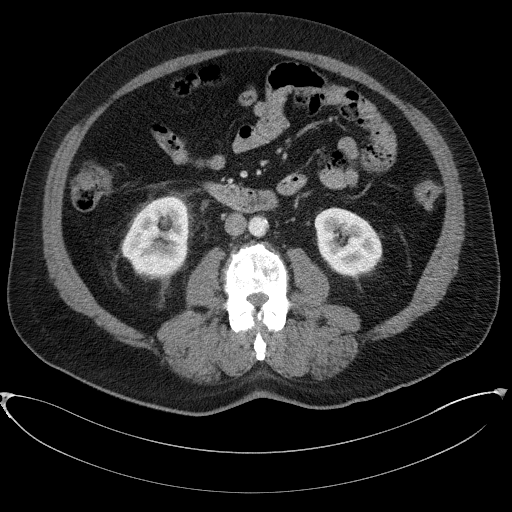
[im 61/105  soft-tissue]
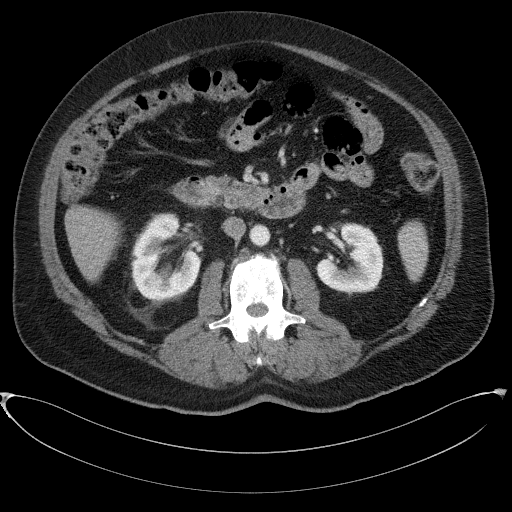
[im 66/105  soft-tissue]
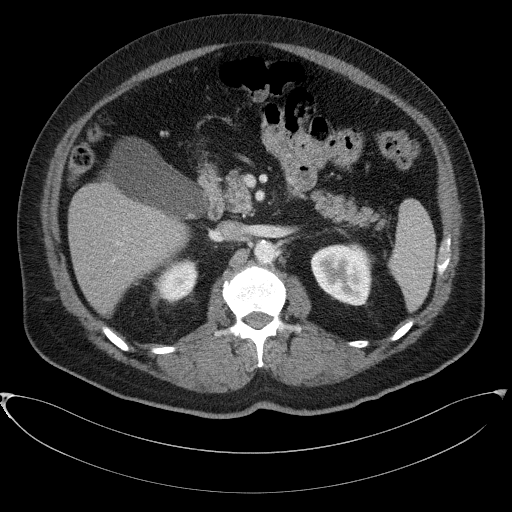
[im 66/105  bone]
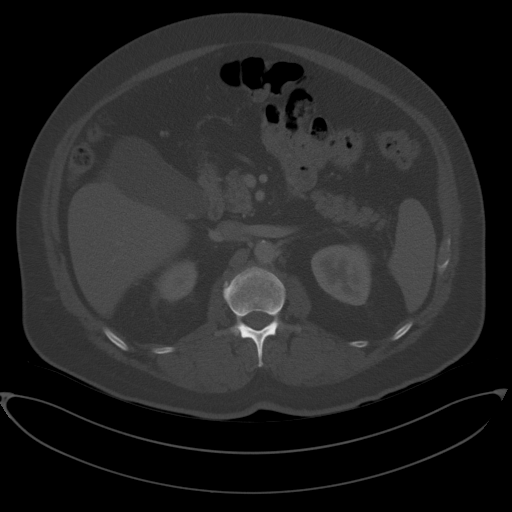
[im 77/105  soft-tissue]
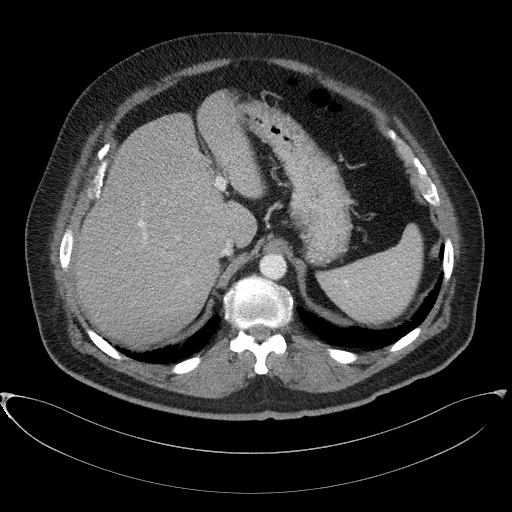
[im 83/105  soft-tissue]
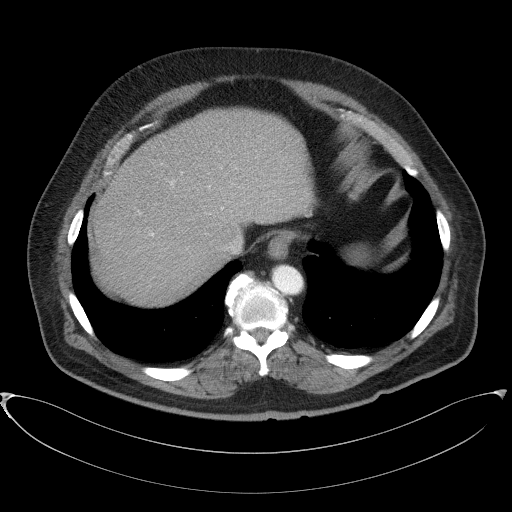
[im 88/105  soft-tissue]
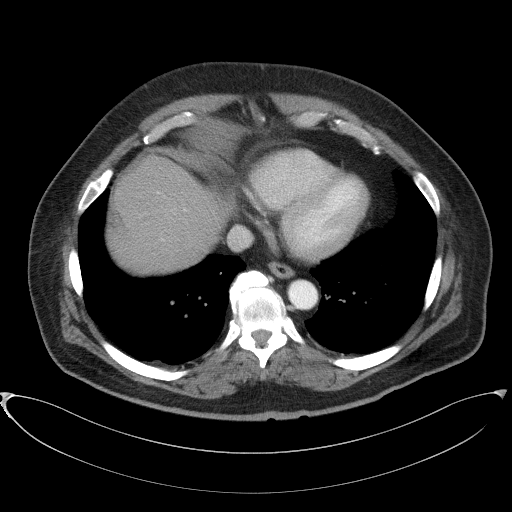
[im 99/105  soft-tissue]
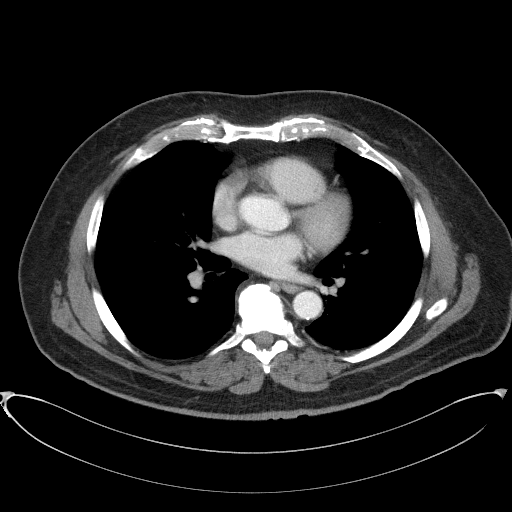

[Series 5: a/p w/ cor · coronal · 0.92mm/px · 3 of 176 slices shown]
[im 59/176  soft-tissue]
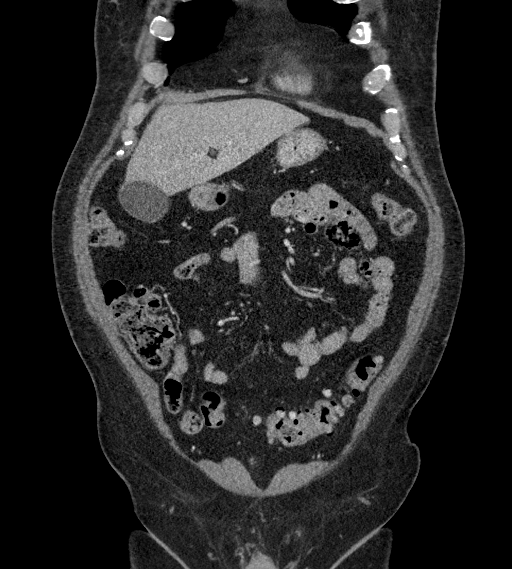
[im 78/176  soft-tissue]
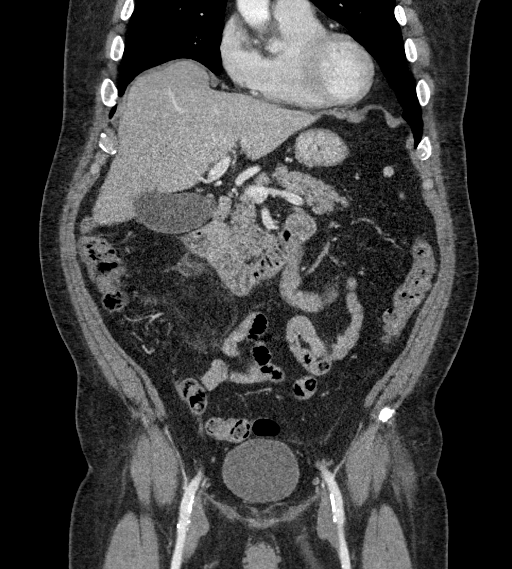
[im 98/176  soft-tissue]
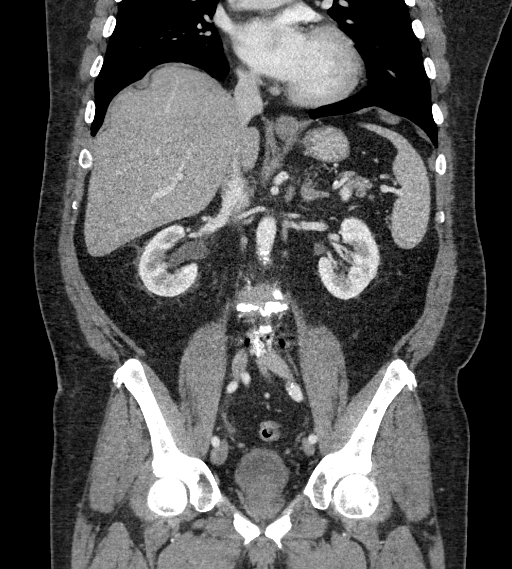

[16 of 46 positions shown; findings below may reference images not displayed]

FINDINGS: Lower chest: Lung bases are clear. No effusions. Heart is normal
size.

Hepatobiliary: Small gallstone in the gallbladder. No focal hepatic
abnormality.

Pancreas: No focal abnormality or ductal dilatation.

Spleen: No focal abnormality.  Normal size.

Adrenals/Urinary Tract: Mild right hydronephrosis. 3 mm mid right
ureteral stone on image 65 and 4 mm distal right ureteral stone near
the UVJ on image 87. Mild right perinephric stranding. No stones or
hydronephrosis on the left. Adrenal glands and urinary bladder are
unremarkable.

Stomach/Bowel: Sigmoid diverticulosis. No active diverticulitis.
Stomach and small bowel are decompressed, unremarkable.

Vascular/Lymphatic: Scattered aortic calcifications. No aneurysm or
adenopathy.

Reproductive: Mild prostate prominence with central calcifications.

Other: No free fluid or free air.

Musculoskeletal: No acute bony abnormality or focal bone lesion.
Postoperative changes at L4-5. Sclerotic area within the L3
vertebral body, presumably bone island if there is no history of
cancer.
IMPRESSION: 3 mm mid right ureteral stone and 4 mm distal right ureteral stone.
Mild right hydronephrosis and perinephric stranding.

Sigmoid diverticulosis.

Sclerotic to focus within the L3 vertebral body, presumably bone
island if there is no clinical history for cancer. Correlation with
clinical history and PSA values is recommended. If further
evaluation is felt warranted, MRI may be beneficial.

Cholelithiasis.

## 2017-12-23 ENCOUNTER — Ambulatory Visit: Payer: Medicare Other | Admitting: *Deleted

## 2017-12-30 ENCOUNTER — Encounter: Payer: Medicare Other | Admitting: Family Medicine

## 2018-01-04 ENCOUNTER — Other Ambulatory Visit: Payer: Self-pay | Admitting: Family Medicine

## 2018-01-06 NOTE — Telephone Encounter (Signed)
Do not see where their is a Uric acid in chart ok to refill? Does he need f/u

## 2018-01-27 DIAGNOSIS — H2513 Age-related nuclear cataract, bilateral: Secondary | ICD-10-CM | POA: Diagnosis not present

## 2018-01-27 DIAGNOSIS — D3130 Benign neoplasm of unspecified choroid: Secondary | ICD-10-CM | POA: Diagnosis not present

## 2018-01-27 DIAGNOSIS — H4322 Crystalline deposits in vitreous body, left eye: Secondary | ICD-10-CM | POA: Diagnosis not present

## 2018-01-31 ENCOUNTER — Ambulatory Visit (INDEPENDENT_AMBULATORY_CARE_PROVIDER_SITE_OTHER): Payer: Medicare Other | Admitting: Family Medicine

## 2018-02-04 ENCOUNTER — Encounter (INDEPENDENT_AMBULATORY_CARE_PROVIDER_SITE_OTHER): Payer: Self-pay | Admitting: Family Medicine

## 2018-02-04 ENCOUNTER — Ambulatory Visit (INDEPENDENT_AMBULATORY_CARE_PROVIDER_SITE_OTHER): Payer: Medicare Other | Admitting: Family Medicine

## 2018-02-04 DIAGNOSIS — G8929 Other chronic pain: Secondary | ICD-10-CM | POA: Diagnosis not present

## 2018-02-04 DIAGNOSIS — M25561 Pain in right knee: Secondary | ICD-10-CM

## 2018-02-04 NOTE — Progress Notes (Signed)
Office Visit Note   Patient: Brandon Flores           Date of Birth: 04-17-1945           MRN: [id number] Visit Date: 02/04/2018 Requested by: Lavone Orn, MD [address] [place], [place] [address] PCP: Lavone Orn, MD  Subjective: Chief Complaint  Patient presents with  . bil.knee, bil ankle and bil hand pain  . right knee hurts worse today    HPI: He is here with right knee pain.  Injections last year stopped giving him relief.  Left knee did okay with injection, but right knee has not.  MRI scan showed some patellofemoral issues but nothing that would benefit from arthroscopic surgery.  His knee gives out sometimes when going downstairs and that is his biggest concern, especially when he is carrying something heavy at work.              ROS: Otherwise noncontributory  Objective: Vital Signs: There were no vitals taken for this visit.  Physical Exam:  Right knee: 1+ patellofemoral crepitus with pain on patellar compression.  No tenderness quadriceps or patellar tendons.  Mild medial and lateral joint line tenderness but no palpable click on McMurray's.  Imaging: None today.  Assessment & Plan: 1.  Chronic right knee patellofemoral pain -He will try a chopat strap. -Could repeat cortisone injection if symptoms persist.   Follow-Up Instructions: No follow-ups on file.      Procedures: No procedures performed  No notes on file    PMFS History: Patient Active Problem List   Diagnosis Date Noted  . Obesity (BMI 30-39.9) 08/31/2016  . Chronic neck pain 08/31/2016  . Cervical radiculopathy 05/03/2016  . HLD (hyperlipidemia) 05/03/2016  . GERD (gastroesophageal reflux disease) 04/25/2016  . Hyperuricemia 04/25/2016  . Low back pain without sciatica 04/25/2016  . Degenerative arthritis of left knee 04/25/2016  . Metatarsalgia of both feet 04/25/2016  . Gout 04/25/2016  . Migraine headache 04/25/2016  . Nuclear sclerosis of both eyes 04/25/2016    . History of colonic polyps 04/25/2016  . VBI (vertebrobasilar insufficiency) 04/25/2016  . Vertigo 03/10/2014  . Generalized convulsive epilepsy ([place]) 11/12/2013  . Choroidal nevus, right 10/12/2013  . Vitreous floaters 10/12/2013   Past Medical History:  Diagnosis Date  . Cervical radiculopathy   . Colon polyp    (2006- adenoma and tubulovillous adenoma; 2010-adenomas; repeat 201)-Dr. Wynetta Emery  . Complex partial seizure disorder ([place]) 10/08/2014  . GERD (gastroesophageal reflux disease)   . Hypercholesterolemia   . Lateral epicondylitis    2002  . Migraine    headaches  . Ophthalmalgia    Dr. Gershon Crane  . Orthodontics    Dr. Junius Roads  . Partial epilepsy with impairment of consciousness ([place])   . Plantar fasciitis   . Seizure disorder ([place])    Dr. Sabra Heck ([place])  . Seizures ([place])   . UTI (urinary tract infection)   . Vertebrobasilar insufficiency   . Vertigo     Family History  Problem Relation Age of Onset  . Alcohol abuse Father   . COPD Sister   . Cancer Sister        lung  . Seizures Neg Hx     Past Surgical History:  Procedure Laterality Date  . BACK SURGERY    . HEMORROIDECTOMY    . NECK SURGERY     Social History   Occupational History  . Occupation: Therapist, nutritional    Comment: [employer]  Tobacco Use  . Smoking status: Former Smoker    Packs/day: 3.00    Years: 20.00    Pack years: 60.00    Last attempt to quit: 04/02/1986    Years since quitting: 31.8  . Smokeless tobacco: Never Used  . Tobacco comment: Quit 1988, was a heavy smoker (20-30/day)  Substance and Sexual Activity  . Alcohol use: No  . Drug use: No  . Sexual activity: Not on file

## 2018-03-24 ENCOUNTER — Ambulatory Visit: Payer: Medicare Other | Admitting: *Deleted

## 2018-04-02 DIAGNOSIS — J209 Acute bronchitis, unspecified: Secondary | ICD-10-CM | POA: Diagnosis not present

## 2018-04-04 ENCOUNTER — Encounter (INDEPENDENT_AMBULATORY_CARE_PROVIDER_SITE_OTHER): Payer: Self-pay | Admitting: Family Medicine

## 2018-04-04 ENCOUNTER — Ambulatory Visit (INDEPENDENT_AMBULATORY_CARE_PROVIDER_SITE_OTHER): Payer: Medicare Other | Admitting: Family Medicine

## 2018-04-04 VITALS — BP 133/73 | HR 83 | Temp 98.2°F | Resp 24

## 2018-04-04 DIAGNOSIS — R05 Cough: Secondary | ICD-10-CM

## 2018-04-04 DIAGNOSIS — R059 Cough, unspecified: Secondary | ICD-10-CM

## 2018-04-04 MED ORDER — AMOXICILLIN-POT CLAVULANATE 875-125 MG PO TABS: 1.0000 | ORAL_TABLET | Freq: Two times a day (BID) | ORAL | 0 refills | Status: DC

## 2018-04-04 MED ORDER — HYDROCOD POLST-CPM POLST ER 10-8 MG/5ML PO SUER: 5.0000 mL | Freq: Two times a day (BID) | ORAL | 0 refills | Status: DC | PRN

## 2018-04-04 NOTE — Progress Notes (Signed)
Office Visit Note   Patient: Brandon Flores           Date of Birth: 1946-02-20           MRN: [id number] Visit Date: 04/04/2018 Requested by: Lavone Orn, MD [address] [place], [place] [address] PCP: Lavone Orn, MD  Subjective: Chief Complaint  Patient presents with  . bronchitis    HPI: He is here with cough and congestion.  Symptoms started about a week ago.  Went to an urgent care and was given a prednisone taper and a Z-Pak.  Seems to be getting a little bit better but cannot sleep at night because of his cough.  His wife is sick currently with pneumonia.              ROS: No fevers or chills, otherwise noncontributory.  No past history of pneumonia or asthma.  Objective: Vital Signs: BP 133/73 (BP Location: Left Arm, Patient Position: Sitting, Cuff Size: Large)   Pulse 83   Temp 98.2 F (36.8 C)   Resp (!) 24   SpO2 94%   Physical Exam:  HEENT:  [place]/AT, PERRLA, EOM Full, no nystagmus.  Funduscopic examination within normal limits.  No conjunctival erythema.  Tympanic membranes are pearly gray with normal landmarks.  External ear canals are normal.  Nasal passages are clear.  Oropharynx is clear.  No significant lymphadenopathy.  No thyromegaly or nodules.  2+ carotid pulses without bruits. Lungs: Mild expiratory wheezing throughout but good air movement with no areas of consolidation. Heart: Regular rate and rhythm with no murmurs, rubs, or gallops.    Imaging: None today.  Assessment & Plan: 1.  Cough with possible bronchitis, slowly improving -Cough medicine as needed.  Augmentin if symptoms worsen.  Follow-up PRN.   Follow-Up Instructions: No follow-ups on file.      Procedures: No procedures performed  No notes on file    PMFS History: Patient Active Problem List   Diagnosis Date Noted  . Obesity (BMI 30-39.9) 08/31/2016  . Chronic neck pain 08/31/2016  . Cervical radiculopathy 05/03/2016  . HLD (hyperlipidemia) 05/03/2016  .  GERD (gastroesophageal reflux disease) 04/25/2016  . Hyperuricemia 04/25/2016  . Low back pain without sciatica 04/25/2016  . Degenerative arthritis of left knee 04/25/2016  . Metatarsalgia of both feet 04/25/2016  . Gout 04/25/2016  . Migraine headache 04/25/2016  . Nuclear sclerosis of both eyes 04/25/2016  . History of colonic polyps 04/25/2016  . VBI (vertebrobasilar insufficiency) 04/25/2016  . Vertigo 03/10/2014  . Generalized convulsive epilepsy ([place]) 11/12/2013  . Choroidal nevus, right 10/12/2013  . Vitreous floaters 10/12/2013   Past Medical History:  Diagnosis Date  . Cervical radiculopathy   . Colon polyp    (2006- adenoma and tubulovillous adenoma; 2010-adenomas; repeat 201)-Dr. Wynetta Emery  . Complex partial seizure disorder ([place]) 10/08/2014  . GERD (gastroesophageal reflux disease)   . Hypercholesterolemia   . Lateral epicondylitis    2002  . Migraine    headaches  . Ophthalmalgia    Dr. Gershon Crane  . Orthodontics    Dr. Junius Roads  . Partial epilepsy with impairment of consciousness ([place])   . Plantar fasciitis   . Seizure disorder ([place])    Dr. Sabra Heck ([place])  . Seizures ([place])   . UTI (urinary tract infection)   . Vertebrobasilar insufficiency   . Vertigo     Family History  Problem Relation Age of Onset  . Alcohol abuse Father   . COPD Sister   .  Cancer Sister        lung  . Seizures Neg Hx     Past Surgical History:  Procedure Laterality Date  . BACK SURGERY    . HEMORROIDECTOMY    . NECK SURGERY     Social History   Occupational History  . Occupation: Therapist, nutritional    Comment: [employer]  Tobacco Use  . Smoking status: Former Smoker    Packs/day: 3.00    Years: 20.00    Pack years: 60.00    Last attempt to quit: 04/02/1986    Years since quitting: 32.0  . Smokeless tobacco: Never Used  . Tobacco comment: Quit 1988, was a heavy smoker (20-30/day)  Substance and Sexual Activity  . Alcohol use: No  . Drug use: No  . Sexual activity:  Not on file

## 2018-05-04 ENCOUNTER — Other Ambulatory Visit: Payer: Self-pay | Admitting: Family Medicine

## 2018-05-04 DIAGNOSIS — H8110 Benign paroxysmal vertigo, unspecified ear: Secondary | ICD-10-CM

## 2018-05-05 ENCOUNTER — Other Ambulatory Visit (INDEPENDENT_AMBULATORY_CARE_PROVIDER_SITE_OTHER): Payer: Self-pay | Admitting: Family Medicine

## 2018-05-05 DIAGNOSIS — H8110 Benign paroxysmal vertigo, unspecified ear: Secondary | ICD-10-CM

## 2018-05-05 MED ORDER — MECLIZINE HCL 25 MG PO TABS: 25.0000 mg | ORAL_TABLET | Freq: Three times a day (TID) | ORAL | 6 refills | Status: DC | PRN

## 2018-05-30 ENCOUNTER — Encounter (INDEPENDENT_AMBULATORY_CARE_PROVIDER_SITE_OTHER): Payer: Self-pay | Admitting: Family Medicine

## 2018-05-30 ENCOUNTER — Ambulatory Visit (INDEPENDENT_AMBULATORY_CARE_PROVIDER_SITE_OTHER): Payer: Medicare Other | Admitting: Family Medicine

## 2018-05-30 DIAGNOSIS — M1712 Unilateral primary osteoarthritis, left knee: Secondary | ICD-10-CM

## 2018-05-30 DIAGNOSIS — M1711 Unilateral primary osteoarthritis, right knee: Secondary | ICD-10-CM | POA: Diagnosis not present

## 2018-05-30 DIAGNOSIS — R350 Frequency of micturition: Secondary | ICD-10-CM | POA: Diagnosis not present

## 2018-05-30 MED ORDER — TAMSULOSIN HCL 0.4 MG PO CAPS: 0.4000 mg | ORAL_CAPSULE | Freq: Every day | ORAL | 6 refills | Status: DC

## 2018-05-30 NOTE — Progress Notes (Signed)
Office Visit Note   Patient: Brandon Flores           Date of Birth: September 05, 1945           MRN: [id number] Visit Date: 05/30/2018 Requested by: Lavone Orn, MD [address] [place], [place] [address] PCP: Lavone Orn, MD  Subjective: Chief Complaint  Patient presents with  . Right Knee - Pain  . Left Knee - Pain    HPI: He is here with a couple concerns.  Both of his knees have been hurting him still.  Right one bothers him more than the left, feels like it is going to give out sometimes when going up steps.  For the past couple months he has had urinary frequency both daytime and nighttime.  No burning or hematuria, no fevers or chills.              ROS: Noncontributory  Objective: Vital Signs: There were no vitals taken for this visit.  Physical Exam:  Knees: 2+ patellofemoral crepitus in both knees.  He has pain with patellar compression on the right.  No significant effusion, no joint line tenderness.  Imaging: None today.  Assessment & Plan: 1.  Bilateral knee pain due to chondromalacia patella -Straight leg raises for strengthening.  Cortisone injection if symptoms worsen.  2.  Urinary frequency, suspect BPH. -Urinalysis to look for signs of infection.  Presuming this is normal, trial of Flomax.     Procedures: No procedures performed  No notes on file     PMFS History: Patient Active Problem List   Diagnosis Date Noted  . Obesity (BMI 30-39.9) 08/31/2016  . Chronic neck pain 08/31/2016  . Cervical radiculopathy 05/03/2016  . HLD (hyperlipidemia) 05/03/2016  . GERD (gastroesophageal reflux disease) 04/25/2016  . Hyperuricemia 04/25/2016  . Low back pain without sciatica 04/25/2016  . Degenerative arthritis of left knee 04/25/2016  . Metatarsalgia of both feet 04/25/2016  . Gout 04/25/2016  . Migraine headache 04/25/2016  . Nuclear sclerosis of both eyes 04/25/2016  . History of colonic polyps 04/25/2016  . VBI (vertebrobasilar  insufficiency) 04/25/2016  . Vertigo 03/10/2014  . Generalized convulsive epilepsy ([place]) 11/12/2013  . Choroidal nevus, right 10/12/2013  . Vitreous floaters 10/12/2013   Past Medical History:  Diagnosis Date  . Cervical radiculopathy   . Colon polyp    (2006- adenoma and tubulovillous adenoma; 2010-adenomas; repeat 201)-Dr. Wynetta Emery  . Complex partial seizure disorder ([place]) 10/08/2014  . GERD (gastroesophageal reflux disease)   . Hypercholesterolemia   . Lateral epicondylitis    2002  . Migraine    headaches  . Ophthalmalgia    Dr. Gershon Crane  . Orthodontics    Dr. Junius Roads  . Partial epilepsy with impairment of consciousness ([place])   . Plantar fasciitis   . Seizure disorder ([place])    Dr. Sabra Heck ([place])  . Seizures ([place])   . UTI (urinary tract infection)   . Vertebrobasilar insufficiency   . Vertigo     Family History  Problem Relation Age of Onset  . Alcohol abuse Father   . COPD Sister   . Cancer Sister        lung  . Seizures Neg Hx     Past Surgical History:  Procedure Laterality Date  . BACK SURGERY    . HEMORROIDECTOMY    . NECK SURGERY     Social History   Occupational History  . Occupation: Therapist, nutritional    Comment: [employer]  Tobacco Use  . Smoking status: Former Smoker    Packs/day: 3.00    Years: 20.00    Pack years: 60.00    Last attempt to quit: 04/02/1986    Years since quitting: 32.1  . Smokeless tobacco: Never Used  . Tobacco comment: Quit 1988, was a heavy smoker (20-30/day)  Substance and Sexual Activity  . Alcohol use: No  . Drug use: No  . Sexual activity: Not on file

## 2018-06-01 LAB — URINALYSIS, ROUTINE W REFLEX MICROSCOPIC
Bacteria, UA: NONE SEEN /HPF
Bilirubin Urine: NEGATIVE
Glucose, UA: NEGATIVE
Hgb urine dipstick: NEGATIVE
Hyaline Cast: NONE SEEN /LPF
Ketones, ur: NEGATIVE
NITRITE: NEGATIVE
Protein, ur: NEGATIVE
RBC / HPF: NONE SEEN /HPF (ref 0–2)
SPECIFIC GRAVITY, URINE: 1.021 (ref 1.001–1.03)
Squamous Epithelial / HPF: NONE SEEN /HPF (ref <=5)
pH: 7 (ref 5.0–8.0)

## 2018-06-01 LAB — URINE CULTURE
MICRO NUMBER:: 257292
SPECIMEN QUALITY: ADEQUATE

## 2018-06-02 ENCOUNTER — Telehealth (INDEPENDENT_AMBULATORY_CARE_PROVIDER_SITE_OTHER): Payer: Self-pay | Admitting: Family Medicine

## 2018-06-02 MED ORDER — SULFAMETHOXAZOLE-TRIMETHOPRIM 800-160 MG PO TABS: 1.0000 | ORAL_TABLET | Freq: Two times a day (BID) | ORAL | 0 refills | Status: DC

## 2018-06-02 NOTE — Telephone Encounter (Signed)
Urine culture grew bacteria.  Will treat with bactrim.

## 2018-06-04 ENCOUNTER — Telehealth (INDEPENDENT_AMBULATORY_CARE_PROVIDER_SITE_OTHER): Payer: Self-pay

## 2018-06-04 NOTE — Telephone Encounter (Signed)
I called the patient because he had not read his MyChart message about the urine culture results and the need for Bactrim. He was at work, so I talked with [name], his wife. She has already picked up the Bactrim. I advised her that he needs to stop the Flomax and just start taking this antibiotic. She requested a copy of the culture result to be mailed to the home address - done.

## 2018-06-05 ENCOUNTER — Ambulatory Visit: Payer: Medicare Other | Admitting: Adult Health

## 2018-06-11 ENCOUNTER — Telehealth: Payer: Self-pay | Admitting: Adult Health

## 2018-06-11 MED ORDER — CARBAMAZEPINE 200 MG PO TABS: 400.0000 mg | ORAL_TABLET | Freq: Three times a day (TID) | ORAL | 0 refills | Status: DC

## 2018-06-11 NOTE — Telephone Encounter (Signed)
Pt's wife Sheila/DPR request refill for carbamazepine (TEGRETOL) 200 MG tablet sent to [facility]. He does not have enough to last 2 days. She said the pharmacy sent the request to a Dr Juleen China. Please call Mrs Gloriann Loan when the script has been sent. Remind her to call [facility] before going to make sure it has been filled so she is not waiting for it. She is very anxious and does not want him to run out.

## 2018-06-11 NOTE — Telephone Encounter (Signed)
I called pt and relayed that prescription was placed for 3 months at [facility].  Pt has appt 08-12-2018 with MM/NP.

## 2018-06-17 ENCOUNTER — Telehealth (INDEPENDENT_AMBULATORY_CARE_PROVIDER_SITE_OTHER): Payer: Self-pay

## 2018-06-17 DIAGNOSIS — J209 Acute bronchitis, unspecified: Secondary | ICD-10-CM | POA: Diagnosis not present

## 2018-06-17 DIAGNOSIS — R9431 Abnormal electrocardiogram [ECG] [EKG]: Secondary | ICD-10-CM | POA: Diagnosis not present

## 2018-06-17 DIAGNOSIS — R000 Tachycardia, unspecified: Secondary | ICD-10-CM | POA: Diagnosis not present

## 2018-06-17 DIAGNOSIS — R1084 Generalized abdominal pain: Secondary | ICD-10-CM | POA: Diagnosis not present

## 2018-06-17 NOTE — Telephone Encounter (Signed)
Brandon Flores (wife) called to get some advice about an appointment the patient went to at an urgent care.  He went there to be seen because he had been shivering.Per Brandon Flores, he had an EKG and CXR - both normal, received 2 cortisone injections and was given an Rx for Emycin. He also has an appointment tomorrow at 8 am for an echocardiogram and with someone else at 4 to go over the findings of the echo.  She does not understand why these things were done and she is unsure of whether or not he should take the antibiotic.  I asked her to call the urgent care and have them send the office note/test results here by fax so that Dr. Junius Roads could review them.  Awaiting fax.

## 2018-06-17 NOTE — Telephone Encounter (Signed)
I called x 2 - reached Sheila's voice mailbox - full and cannot leave a message. Will call her back in the morning.

## 2018-06-17 NOTE — Telephone Encounter (Signed)
We received the fax from [facility]. No actual office notes were included, but it does include the EKG/interpretation, names of the injections given and diagnosis.

## 2018-06-17 NOTE — Telephone Encounter (Signed)
I'm not exactly sure why they are ordering the echocardiogram but it might be due to the computer reading of the EKG.  I compared the recent one to an EKG from 2015 and it's slightly different.  Maybe that's why they ordered the echocardiogram.  Probably just a precaution.

## 2018-06-18 NOTE — Telephone Encounter (Signed)
Dr. Junius Roads spoke with Brandon Flores this morning on the phone.

## 2018-06-19 ENCOUNTER — Other Ambulatory Visit (INDEPENDENT_AMBULATORY_CARE_PROVIDER_SITE_OTHER): Payer: Self-pay | Admitting: Family Medicine

## 2018-06-19 MED ORDER — OSELTAMIVIR PHOSPHATE 75 MG PO CAPS: 75.0000 mg | ORAL_CAPSULE | Freq: Two times a day (BID) | ORAL | 0 refills | Status: DC

## 2018-06-23 ENCOUNTER — Other Ambulatory Visit (INDEPENDENT_AMBULATORY_CARE_PROVIDER_SITE_OTHER): Payer: Self-pay | Admitting: Family Medicine

## 2018-06-23 DIAGNOSIS — Z1211 Encounter for screening for malignant neoplasm of colon: Secondary | ICD-10-CM

## 2018-08-07 ENCOUNTER — Telehealth: Payer: Self-pay

## 2018-08-07 NOTE — Telephone Encounter (Signed)
Spoke with the patient's wife due him being at work. She has given verbal consent to file his insurance and to do a doxy.me visit with Megan on 08/12/2018. Mobile number and carrier have been confirmed and sent.  Text: [phone]([email or web address])

## 2018-08-11 NOTE — Telephone Encounter (Signed)
Chart updated.  email resent.

## 2018-08-11 NOTE — Addendum Note (Signed)
Addended by: Brandon Melnick on: 08/11/2018 04:33 PM   Modules accepted: Orders

## 2018-08-12 ENCOUNTER — Other Ambulatory Visit: Payer: Self-pay | Admitting: *Deleted

## 2018-08-12 ENCOUNTER — Encounter: Payer: Medicare Other | Admitting: Adult Health

## 2018-08-12 ENCOUNTER — Other Ambulatory Visit: Payer: Self-pay

## 2018-08-12 MED ORDER — CARBAMAZEPINE 200 MG PO TABS: 400.0000 mg | ORAL_TABLET | Freq: Three times a day (TID) | ORAL | 0 refills | Status: DC

## 2018-08-12 NOTE — Progress Notes (Signed)
This encounter was created in error - please disregard.

## 2018-08-12 NOTE — Telephone Encounter (Signed)
Rescheduled appt in 11-10-18 at 0800.  Gave 3 month supply of sz medication.

## 2018-08-27 ENCOUNTER — Telehealth: Payer: Self-pay

## 2018-08-27 NOTE — Telephone Encounter (Signed)
Spoke with the patient's wife and she has agreed to schedule her husbands appt with Sarah for 11/10/2018 at 8:45 am.

## 2018-08-28 ENCOUNTER — Telehealth: Payer: Self-pay | Admitting: Neurology

## 2018-08-28 NOTE — Telephone Encounter (Signed)
I contacted the pt's wife and advised Rx was sent on 08/12/18. Wife is going to check with [facility].

## 2018-08-28 NOTE — Telephone Encounter (Signed)
Pt's wife called to put in a refill request for his carbamazepine (TEGRETOL) 200 MG tablet sent to [facility] [address]

## 2018-09-23 ENCOUNTER — Encounter: Payer: Self-pay | Admitting: Gastroenterology

## 2018-09-23 ENCOUNTER — Telehealth: Payer: Self-pay | Admitting: Gastroenterology

## 2018-09-23 NOTE — Telephone Encounter (Signed)
Hi Dr. Fuller Plan, we received a referral from Dr. Junius Roads for pt to have a repeat colon. He had a colonoscopy in 2015 at [place]. We received records and they will be placed on your desk for review. Please advise on scheduling. Thank you.

## 2018-09-23 NOTE — Telephone Encounter (Signed)
Per Dr. Fuller Plan, pt scheduled for direct colon on 11/03/18 at 9:00 am at [facility].

## 2018-10-20 ENCOUNTER — Other Ambulatory Visit: Payer: Self-pay

## 2018-10-20 ENCOUNTER — Telehealth: Payer: Self-pay | Admitting: *Deleted

## 2018-10-20 ENCOUNTER — Ambulatory Visit (AMBULATORY_SURGERY_CENTER): Payer: Self-pay

## 2018-10-20 VITALS — Ht 69.0 in | Wt 240.0 lb

## 2018-10-20 DIAGNOSIS — Z86010 Personal history of colonic polyps: Secondary | ICD-10-CM

## 2018-10-20 MED ORDER — NA SULFATE-K SULFATE-MG SULF 17.5-3.13-1.6 GM/177ML PO SOLN: 1.0000 | Freq: Once | ORAL | 0 refills | Status: AC

## 2018-10-20 NOTE — Progress Notes (Signed)
Denies allergies to eggs or soy products. Denies complication of anesthesia or sedation. Denies use of weight loss medication. Denies use of O2.   Emmi instructions given for colonoscopy.  Pre-Visit was conducted by phone due to Covid 19. Instructions were reviewed and mailed to patients confirmed home address. Patient was encouraged to call if he had any questions regarding instructions.

## 2018-10-20 NOTE — Telephone Encounter (Signed)
Patient called back and rescheduled PV for 330 pm today.

## 2018-10-20 NOTE — Telephone Encounter (Signed)
Patient was called 3 times, no answer, and the voice mailbox is full, unable to leave a message. Will try again later.

## 2018-10-31 ENCOUNTER — Telehealth: Payer: Self-pay | Admitting: Gastroenterology

## 2018-10-31 NOTE — Telephone Encounter (Signed)

## 2018-11-03 ENCOUNTER — Encounter: Payer: Self-pay | Admitting: Gastroenterology

## 2018-11-03 ENCOUNTER — Ambulatory Visit (AMBULATORY_SURGERY_CENTER): Payer: Medicare Other | Admitting: Gastroenterology

## 2018-11-03 ENCOUNTER — Other Ambulatory Visit: Payer: Self-pay

## 2018-11-03 VITALS — BP 121/73 | HR 55 | Temp 97.3°F | Resp 13 | Ht 69.0 in | Wt 240.0 lb

## 2018-11-03 DIAGNOSIS — D124 Benign neoplasm of descending colon: Secondary | ICD-10-CM | POA: Diagnosis not present

## 2018-11-03 DIAGNOSIS — D123 Benign neoplasm of transverse colon: Secondary | ICD-10-CM | POA: Diagnosis not present

## 2018-11-03 DIAGNOSIS — D122 Benign neoplasm of ascending colon: Secondary | ICD-10-CM

## 2018-11-03 DIAGNOSIS — Z86010 Personal history of colonic polyps: Secondary | ICD-10-CM

## 2018-11-03 DIAGNOSIS — R569 Unspecified convulsions: Secondary | ICD-10-CM | POA: Diagnosis not present

## 2018-11-03 DIAGNOSIS — Z1211 Encounter for screening for malignant neoplasm of colon: Secondary | ICD-10-CM | POA: Diagnosis not present

## 2018-11-03 DIAGNOSIS — E7800 Pure hypercholesterolemia, unspecified: Secondary | ICD-10-CM | POA: Diagnosis not present

## 2018-11-03 MED ORDER — SODIUM CHLORIDE 0.9 % IV SOLN: 500.0000 mL | Freq: Once | INTRAVENOUS | Status: DC

## 2018-11-03 NOTE — Patient Instructions (Signed)
Information on polyps, hemorrhoids, and diverticulosis given to you today.  Await pathology results.  Eat a high fiber diet.  YOU HAD AN ENDOSCOPIC PROCEDURE TODAY AT [facility]:   Refer to the procedure report that was given to you for any specific questions about what was found during the examination.  If the procedure report does not answer your questions, please call your gastroenterologist to clarify.  If you requested that your care partner not be given the details of your procedure findings, then the procedure report has been included in a sealed envelope for you to review at your convenience later.  YOU SHOULD EXPECT: Some feelings of bloating in the abdomen. Passage of more gas than usual.  Walking can help get rid of the air that was put into your GI tract during the procedure and reduce the bloating. If you had a lower endoscopy (such as a colonoscopy or flexible sigmoidoscopy) you may notice spotting of blood in your stool or on the toilet paper. If you underwent a bowel prep for your procedure, you may not have a normal bowel movement for a few days.  Please Note:  You might notice some irritation and congestion in your nose or some drainage.  This is from the oxygen used during your procedure.  There is no need for concern and it should clear up in a day or so.  SYMPTOMS TO REPORT IMMEDIATELY:   Following lower endoscopy (colonoscopy or flexible sigmoidoscopy):  Excessive amounts of blood in the stool  Significant tenderness or worsening of abdominal pains  Swelling of the abdomen that is new, acute  Fever of 100F or higher  For urgent or emergent issues, a gastroenterologist can be reached at any hour by calling [phone].   DIET:  We do recommend a small meal at first, but then you may proceed to your regular diet.  Drink plenty of fluids but you should avoid alcoholic beverages for 24 hours.  ACTIVITY:  You should plan to take it easy for the rest of  today and you should NOT DRIVE or use heavy machinery until tomorrow (because of the sedation medicines used during the test).    FOLLOW UP: Our staff will call the number listed on your records 48-72 hours following your procedure to check on you and address any questions or concerns that you may have regarding the information given to you following your procedure. If we do not reach you, we will leave a message.  We will attempt to reach you two times.  During this call, we will ask if you have developed any symptoms of COVID 19. If you develop any symptoms (ie: fever, flu-like symptoms, shortness of breath, cough etc.) before then, please call [phone].  If you test positive for Covid 19 in the 2 weeks post procedure, please call and report this information to [place].    If any biopsies were taken you will be contacted by phone or by letter within the next 1-3 weeks.  Please call us at [phone] if you have not heard about the biopsies in 3 weeks.    SIGNATURES/CONFIDENTIALITY: You and/or your care partner have signed paperwork which will be entered into your electronic medical record.  These signatures attest to the fact that that the information above on your After Visit Summary has been reviewed and is understood.  Full responsibility of the confidentiality of this discharge information lies with you and/or your care-partner.

## 2018-11-03 NOTE — Op Note (Signed)
[place] Patient Name: Brandon Flores Procedure Date: 11/03/2018 9:06 AM MRN: [id number] Endoscopist: Ladene Artist , MD Age: 73 Referring MD:  Date of Birth: April 07, 1945 Gender: Male Account #: [id number] Procedure:                Colonoscopy Indications:              Surveillance: Personal history of adenomatous                            polyps on last colonoscopy 5 years ago Medicines:                Monitored Anesthesia Care Procedure:                Pre-Anesthesia Assessment:                           - Prior to the procedure, a History and Physical                            was performed, and patient medications and                            allergies were reviewed. The patient's tolerance of                            previous anesthesia was also reviewed. The risks                            and benefits of the procedure and the sedation                            options and risks were discussed with the patient.                            All questions were answered, and informed consent                            was obtained. Prior Anticoagulants: The patient has                            taken no previous anticoagulant or antiplatelet                            agents. ASA Grade Assessment: II - A patient with                            mild systemic disease. After reviewing the risks                            and benefits, the patient was deemed in                            satisfactory condition to undergo the procedure.  After obtaining informed consent, the colonoscope                            was passed under direct vision. Throughout the                            procedure, the patient's blood pressure, pulse, and                            oxygen saturations were monitored continuously. The                            Colonoscope was introduced through the anus and                            advanced to the the cecum,  identified by                            appendiceal orifice and ileocecal valve. The                            ileocecal valve, appendiceal orifice, and rectum                            were photographed. The quality of the bowel                            preparation was good. The colonoscopy was performed                            without difficulty. The patient tolerated the                            procedure well. Scope In: 9:09:45 AM Scope Out: 9:26:21 AM Scope Withdrawal Time: 0 hours 15 minutes 20 seconds  Total Procedure Duration: 0 hours 16 minutes 36 seconds  Findings:                 The perianal and digital rectal examinations were                            normal.                           A 4 mm polyp was found in the ascending colon. The                            polyp was sessile. The polyp was removed with a                            cold biopsy forceps. Resection and retrieval were                            complete.  Seven sessile polyps were found in the descending                            colon (1) and transverse colon (6). The polyps were                            6 to 8 mm in size. These polyps were removed with a                            cold snare. Resection and retrieval were complete.                           A few small-mouthed diverticula were found in the                            left colon.                           Internal hemorrhoids were found during                            retroflexion. The hemorrhoids were small and Grade                            I (internal hemorrhoids that do not prolapse).                           The exam was otherwise without abnormality on                            direct and retroflexion views. Complications:            No immediate complications. Estimated blood loss:                            None. Estimated Blood Loss:     Estimated blood loss: none. Impression:                - One 4 mm polyp in the ascending colon, removed                            with a cold biopsy forceps. Resected and retrieved.                           - Seven 6 to 8 mm polyps in the descending colon                            and in the transverse colon, removed with a cold                            snare. Resected and retrieved.                           - Diverticulosis in the left colon.                           -  Internal hemorrhoids.                           - The examination was otherwise normal on direct                            and retroflexion views. Recommendation:           - Repeat colonoscopy in 3 years for surveillance,                            pending pathology review.                           - Patient has a contact number available for                            emergencies. The signs and symptoms of potential                            delayed complications were discussed with the                            patient. Return to normal activities tomorrow.                            Written discharge instructions were provided to the                            patient.                           - High fiber diet.                           - Continue present medications.                           - Await pathology results. Ladene Artist, MD 11/03/2018 9:30:57 AM This report has been signed electronically.

## 2018-11-03 NOTE — Progress Notes (Signed)
Called to room to assist during endoscopic procedure.  Patient ID and intended procedure confirmed with present staff. Received instructions for my participation in the procedure from the performing physician.  

## 2018-11-03 NOTE — Progress Notes (Signed)
Pt's states no medical or surgical changes since previsit or office visit.  [place], [place]

## 2018-11-03 NOTE — Progress Notes (Signed)
Report given to PACU, vss 

## 2018-11-05 ENCOUNTER — Telehealth: Payer: Self-pay

## 2018-11-05 NOTE — Telephone Encounter (Signed)
  Follow up Call-  Call back number 11/03/2018  Post procedure Call Back phone  # [phone] cell  Permission to leave phone message Yes  Some recent data might be hidden     Patient questions:  Do you have a fever, pain , or abdominal swelling? No. Pain Score  0 *  Have you tolerated food without any problems? yes  Have you been able to return to your normal activities? Yes.    Do you have any questions about your discharge instructions: Diet   No. Medications  No. Follow up visit  No.  Do you have questions or concerns about your Care? No.  Actions: * If pain score is 4 or above: No action needed, pain <4. 1. Have you developed a fever since your procedure? no  2.   Have you had an respiratory symptoms (SOB or cough) since your procedure? no  3.   Have you tested positive for COVID 19 since your procedure no  4.   Have you had any family members/close contacts diagnosed with the COVID 19 since your procedure?  no   If yes to any of these questions please route to Joylene John, RN and Alphonsa Gin, Therapist, sports.

## 2018-11-09 NOTE — Progress Notes (Signed)
PATIENT: Brandon Flores DOB: 01-23-1946  REASON FOR VISIT: follow up HISTORY FROM: patient  HISTORY OF PRESENT ILLNESS: Today 11/10/18  Brandon Flores is a 73 year old male with history of seizures.  He remains on Tegretol 400 mg 3 times a day.  He does require brand name Tegretol.  He reports compliance with Tegretol.  He reports he may have a "small event" in the evening when he is watching TV after a very long day.  He describes this as a wave traveling from his toes to the rest of his body. There is no full body shaking.  He says it lasts less than a minute.  He says he is aware of it, he fights it and it goes away.  He says this is been typical for many years.  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  He denies any new problems or concerns.  He presents today for follow-up accompanied by his wife.  HISTORY 06/03/2017 MM: Brandon Flores is a 73 year old male with a history of seizures.  He returns today for follow-up.  He remains on Tegretol taking 400 mg 3 times a day.  Reports that he tolerates the medication well.  Denies any seizure events.  Denies any changes with his gait or balance.  He continues to work full-time.  He operates a Teacher, music without difficulty.  Denies any new neurological symptoms.  REVIEW OF SYSTEMS: Out of a complete 14 system review of symptoms, the patient complains only of the following symptoms, and all other reviewed systems are negative.  Seizures  ALLERGIES: No Known Allergies  HOME MEDICATIONS: Outpatient Medications Prior to Visit  Medication Sig Dispense Refill  . allopurinol (ZYLOPRIM) 100 MG tablet Take 1 tablet (100 mg total) by mouth daily. 30 tablet 3  . meclizine (ANTIVERT) 25 MG tablet Take 1 tablet (25 mg total) by mouth 3 (three) times daily as needed for dizziness. 30 tablet 6  . OVER THE COUNTER MEDICATION Stool softener with probiotic twice a week.    . tamsulosin (FLOMAX) 0.4 MG CAPS  capsule Take 1 capsule (0.4 mg total) by mouth daily after supper. 30 capsule 6  . carbamazepine (TEGRETOL) 200 MG tablet Take 2 tablets (400 mg total) by mouth 3 (three) times daily. Brand is medically necessary 540 tablet 0  . atorvastatin (LIPITOR) 10 MG tablet Take 10 mg by mouth daily.     . diclofenac sodium (VOLTAREN) 1 % GEL Apply 4 g topically 4 (four) times daily as needed. 500 g 6  . ibuprofen (ADVIL,MOTRIN) 200 MG tablet Take 200 mg by mouth every 6 (six) hours as needed for moderate pain.      No facility-administered medications prior to visit.     PAST MEDICAL HISTORY: Past Medical History:  Diagnosis Date  . Cervical radiculopathy   . Colon polyp    (2006- adenoma and tubulovillous adenoma; 2010-adenomas; repeat 201)-Dr. Wynetta Emery  . Complex partial seizure disorder ([place]) 10/08/2014  . GERD (gastroesophageal reflux disease)   . Gout   . Hypercholesterolemia   . Lateral epicondylitis    2002  . Migraine    headaches  . Ophthalmalgia    Dr. Gershon Crane  . Orthodontics    Dr. Junius Roads  . Partial epilepsy with impairment of consciousness ([place])   . Plantar fasciitis   . Seizure disorder ([place])    Dr. Sabra Heck ([facility])  . Seizures ([place])   . UTI (urinary tract infection)   .  Vertebrobasilar insufficiency   . Vertigo     PAST SURGICAL HISTORY: Past Surgical History:  Procedure Laterality Date  . BACK SURGERY    . HEMORROIDECTOMY    . NECK SURGERY      FAMILY HISTORY: Family History  Problem Relation Age of Onset  . Alcohol abuse Father   . COPD Sister   . Cancer Sister        lung  . Seizures Neg Hx   . Colon cancer Neg Hx   . Esophageal cancer Neg Hx   . Rectal cancer Neg Hx   . Stomach cancer Neg Hx     SOCIAL HISTORY: Social History   Socioeconomic History  . Marital status: Married    Spouse name: Kizzie Furnish  . Number of children: 2  . Years of education: 67  . Highest education level: Not on file  Occupational History  . Occupation: Teacher, music    Comment: [employer]  Social Needs  . Financial resource strain: Not on file  . Food insecurity    Worry: Not on file    Inability: Not on file  . Transportation needs    Medical: Not on file    Non-medical: Not on file  Tobacco Use  . Smoking status: Former Smoker    Packs/day: 3.00    Years: 20.00    Pack years: 60.00    Quit date: 04/02/1986    Years since quitting: 32.6  . Smokeless tobacco: Never Used  . Tobacco comment: Quit 1988, was a heavy smoker (20-30/day)  Substance and Sexual Activity  . Alcohol use: No  . Drug use: No  . Sexual activity: Not on file  Lifestyle  . Physical activity    Days per week: Not on file    Minutes per session: Not on file  . Stress: Not on file  Relationships  . Social Herbalist on phone: Not on file    Gets together: Not on file    Attends religious service: Not on file    Active member of club or organization: Not on file    Attends meetings of clubs or organizations: Not on file    Relationship status: Not on file  . Intimate partner violence    Fear of current or ex partner: Not on file    Emotionally abused: Not on file    Physically abused: Not on file    Forced sexual activity: Not on file  Other Topics Concern  . Not on file  Social History Narrative   Occasional glass of caffeine   Patient is right handed.   Lives at home with his wife   They have 6 children together and 15 grandchildren      PHYSICAL EXAM  Vitals:   11/10/18 0851  BP: 138/79  Pulse: 69  Temp: (!) 97.5 F (36.4 C)  Weight: 242 lb (109.8 kg)  Height: 5\' 9"  (1.753 m)   Body mass index is 35.74 kg/m.  Generalized: Well developed, in no acute distress   Neurological examination  Mentation: Alert oriented to time, place, history taking. Follows all commands speech and language fluent Cranial nerve II-XII: Pupils were equal round reactive to light. Extraocular movements were full, visual field were full on  confrontational test. Facial sensation and strength were normal. Head turning and shoulder shrug  were normal and symmetric. Motor: The motor testing reveals 5 over 5 strength of all 4 extremities. Good symmetric motor tone is noted  throughout.  Sensory: Sensory testing is intact to soft touch on all 4 extremities. No evidence of extinction is noted.  Coordination: Cerebellar testing reveals good finger-nose-finger and heel-to-shin bilaterally.  Gait and station: Gait is normal. Tandem gait is normal. Romberg is negative. No drift is seen.  Reflexes: Deep tendon reflexes are symmetric and normal bilaterally.   DIAGNOSTIC DATA (LABS, IMAGING, TESTING) - I reviewed patient records, labs, notes, testing and imaging myself where available.  Lab Results  Component Value Date   WBC 5.2 06/03/2017   HGB 15.3 06/03/2017   HCT 44.9 06/03/2017   MCV 94 06/03/2017   PLT 173 06/03/2017      Component Value Date/Time   NA 144 06/03/2017 0808   K 3.9 06/03/2017 0808   CL 106 06/03/2017 0808   CO2 21 06/03/2017 0808   GLUCOSE 114 (H) 06/03/2017 0808   GLUCOSE 144 (H) 05/11/2016 0710   BUN 23 06/03/2017 0808   CREATININE 0.90 06/03/2017 0808   CALCIUM 8.7 06/03/2017 0808   PROT 6.5 06/03/2017 0808   ALBUMIN 3.8 06/03/2017 0808   AST 12 06/03/2017 0808   ALT 13 06/03/2017 0808   ALKPHOS 84 06/03/2017 0808   BILITOT <0.2 06/03/2017 0808   GFRNONAA 86 06/03/2017 0808   GFRAA 100 06/03/2017 0808   Lab Results  Component Value Date   CHOL 206 (H) 04/30/2016   HDL 39.60 04/30/2016   LDLDIRECT 119.0 04/30/2016   TRIG 270.0 (H) 04/30/2016   CHOLHDL 5 04/30/2016   No results found for: HGBA1C No results found for: VITAMINB12 No results found for: TSH   ASSESSMENT AND PLAN 73 y.o. year old male  has a past medical history of Cervical radiculopathy, Colon polyp, Complex partial seizure disorder ([place]) (10/08/2014), GERD (gastroesophageal reflux disease), Gout, Hypercholesterolemia, Lateral  epicondylitis, Migraine, Ophthalmalgia, Orthodontics, Partial epilepsy with impairment of consciousness ([place]), Plantar fasciitis, Seizure disorder ([place]), Seizures ([place]), UTI (urinary tract infection), Vertebrobasilar insufficiency, and Vertigo. here with:  1. Seizures   Overall his condition has remained stable. He describes a few mini-events he may have on occasion in the evening after a long day. These have been documented back in 2017 and 2018 as well. He says this has been happening for years.  I will check lab work today.  He will continue taking Tegretol 400 mg 3 times a day.  He does require brand name. He says this is quite expensive. I will check to see if there are any savings options available to him.  He will follow-up in 1 year or sooner if needed.  I advised that if his symptoms worsen or if he develops any new symptoms he should let us know.   I spent 15 minutes with the patient. 50% of this time was spent discussing his plan of care.    Butler Denmark, AGNP-C, DNP 11/10/2018, 11:57 AM [facility] [address] [place], [place] [address] [phone]

## 2018-11-10 ENCOUNTER — Ambulatory Visit (INDEPENDENT_AMBULATORY_CARE_PROVIDER_SITE_OTHER): Payer: Medicare Other | Admitting: Neurology

## 2018-11-10 ENCOUNTER — Telehealth: Payer: Self-pay | Admitting: Neurology

## 2018-11-10 ENCOUNTER — Other Ambulatory Visit: Payer: Self-pay

## 2018-11-10 ENCOUNTER — Encounter: Payer: Self-pay | Admitting: Neurology

## 2018-11-10 ENCOUNTER — Ambulatory Visit: Payer: Self-pay | Admitting: Adult Health

## 2018-11-10 VITALS — BP 138/79 | HR 69 | Temp 97.5°F | Ht 69.0 in | Wt 242.0 lb

## 2018-11-10 DIAGNOSIS — G40309 Generalized idiopathic epilepsy and epileptic syndromes, not intractable, without status epilepticus: Secondary | ICD-10-CM

## 2018-11-10 MED ORDER — CARBAMAZEPINE 200 MG PO TABS: 400.0000 mg | ORAL_TABLET | Freq: Three times a day (TID) | ORAL | 3 refills | Status: DC

## 2018-11-10 NOTE — Telephone Encounter (Signed)
Can you check to see if there are financial assistance options for the patient for his Tegretol?  It is quite expensive every month.  He requires brand name

## 2018-11-10 NOTE — Progress Notes (Signed)
I have read the note, and I agree with the clinical assessment and plan.  Cherree Conerly K Kasmira Cacioppo   

## 2018-11-10 NOTE — Patient Instructions (Signed)
Please continue current dose of Tegretol. We will check labs. See you in 1 year.

## 2018-11-11 ENCOUNTER — Telehealth: Payer: Self-pay | Admitting: *Deleted

## 2018-11-11 LAB — COMPREHENSIVE METABOLIC PANEL
ALT: 12 IU/L (ref 0–44)
AST: 17 IU/L (ref 0–40)
Albumin/Globulin Ratio: 1.7 (ref 1.2–2.2)
Albumin: 4.1 g/dL (ref 3.7–4.7)
Alkaline Phosphatase: 97 IU/L (ref 39–117)
BUN/Creatinine Ratio: 14 (ref 10–24)
BUN: 16 mg/dL (ref 8–27)
Bilirubin Total: 0.4 mg/dL (ref 0.0–1.2)
CO2: 22 mmol/L (ref 20–29)
Calcium: 9.2 mg/dL (ref 8.6–10.2)
Chloride: 102 mmol/L (ref 96–106)
Creatinine, Ser: 1.13 mg/dL (ref 0.76–1.27)
GFR calc Af Amer: 75 mL/min/{1.73_m2} (ref >59)
GFR calc non Af Amer: 65 mL/min/{1.73_m2} (ref >59)
Globulin, Total: 2.4 g/dL (ref 1.5–4.5)
Glucose: 121 mg/dL — ABNORMAL HIGH (ref 65–99)
Potassium: 4.2 mmol/L (ref 3.5–5.2)
Sodium: 139 mmol/L (ref 134–144)
Total Protein: 6.5 g/dL (ref 6.0–8.5)

## 2018-11-11 LAB — CBC WITH DIFFERENTIAL/PLATELET
Basophils Absolute: 0.1 10*3/uL (ref 0.0–0.2)
Basos: 1 %
EOS (ABSOLUTE): 0.2 10*3/uL (ref 0.0–0.4)
Eos: 3 %
Hematocrit: 44.6 % (ref 37.5–51.0)
Hemoglobin: 15.6 g/dL (ref 13.0–17.7)
Immature Grans (Abs): 0 10*3/uL (ref 0.0–0.1)
Immature Granulocytes: 0 %
Lymphocytes Absolute: 1.7 10*3/uL (ref 0.7–3.1)
Lymphs: 30 %
MCH: 31.8 pg (ref 26.6–33.0)
MCHC: 35 g/dL (ref 31.5–35.7)
MCV: 91 fL (ref 79–97)
Monocytes Absolute: 0.6 10*3/uL (ref 0.1–0.9)
Monocytes: 10 %
Neutrophils Absolute: 3.2 10*3/uL (ref 1.4–7.0)
Neutrophils: 56 %
Platelets: 218 10*3/uL (ref 150–450)
RBC: 4.9 x10E6/uL (ref 4.14–5.80)
RDW: 12.8 % (ref 11.6–15.4)
WBC: 5.7 10*3/uL (ref 3.4–10.8)

## 2018-11-11 LAB — CARBAMAZEPINE LEVEL, TOTAL: Carbamazepine (Tegretol), S: 12.9 ug/mL (ref 4.0–12.0)

## 2018-11-11 NOTE — Telephone Encounter (Signed)
LMVM for pt re: lab results.

## 2018-11-11 NOTE — Telephone Encounter (Signed)
Pt's wife returned call, please call back when available.

## 2018-11-11 NOTE — Telephone Encounter (Signed)
-----   Message from Suzzanne Cloud, NP sent at 11/11/2018  7:54 AM EDT ----- Please call patient. Labs are stable. Carbamazepine level is slightly elevated, there were no signs of toxicity on exam. He reported he was tolerating the medication well. This was not a trough level.

## 2018-11-11 NOTE — Telephone Encounter (Signed)
I called and spoke to pharmacy re: to tegretol BN cost $554.76 / 90 day supply at [facility].  Insurance card # [id number].  I tried to call mobile could not LM, home did not LM.

## 2018-11-12 NOTE — Telephone Encounter (Signed)
I called pt back, gave results of labs, but also relayed about getting tier exception with insurance on tegretol BN [facility] ID [id number].

## 2018-11-12 NOTE — Telephone Encounter (Signed)
I spoke to pt and and gave results of labs slightly elevated level tegretol, will keep same dose, as no s/s toxicity on exam.  Also  BN tegretol priecy.  I will try and do tier exception [facility] F ID [id number].  He will also check on line for PAP / foundation possibility if qualifies.

## 2018-11-12 NOTE — Telephone Encounter (Signed)
-----   Message from Suzzanne Cloud, NP sent at 11/11/2018  7:54 AM EDT ----- Please call patient. Labs are stable. Carbamazepine level is slightly elevated, there were no signs of toxicity on exam. He reported he was tolerating the medication well. This was not a trough level.

## 2018-11-16 ENCOUNTER — Encounter: Payer: Self-pay | Admitting: Gastroenterology

## 2018-11-17 NOTE — Telephone Encounter (Signed)
I called [facility] after attempting to due PA on line (would not accept pt)  MCR [id number]  BIN [id number], RX [place], PCN 9999.  Called [phone], spoke to [place]'  PA approval thru 04/02/19,  Relayed need Tier acception , completed questions.  Determination 48hours, [id number].

## 2018-11-18 NOTE — Telephone Encounter (Signed)
I called pt and wife.  Relayed per optum Rx that insurance denied tier reduction on pt BN tegretol. He verbalized understanding.   Will try to APPEAL for him with letter.

## 2018-11-22 DIAGNOSIS — M5431 Sciatica, right side: Secondary | ICD-10-CM | POA: Diagnosis not present

## 2018-11-22 DIAGNOSIS — Z1159 Encounter for screening for other viral diseases: Secondary | ICD-10-CM | POA: Diagnosis not present

## 2018-12-15 ENCOUNTER — Ambulatory Visit (INDEPENDENT_AMBULATORY_CARE_PROVIDER_SITE_OTHER): Payer: Medicare Other | Admitting: Family Medicine

## 2018-12-15 ENCOUNTER — Encounter: Payer: Self-pay | Admitting: Family Medicine

## 2018-12-15 VITALS — BP 130/78 | HR 85

## 2018-12-15 DIAGNOSIS — K5900 Constipation, unspecified: Secondary | ICD-10-CM | POA: Diagnosis not present

## 2018-12-15 DIAGNOSIS — R350 Frequency of micturition: Secondary | ICD-10-CM

## 2018-12-15 DIAGNOSIS — M545 Low back pain, unspecified: Secondary | ICD-10-CM

## 2018-12-15 MED ORDER — DUTASTERIDE 0.5 MG PO CAPS: 0.5000 mg | ORAL_CAPSULE | Freq: Every evening | ORAL | 6 refills | Status: DC

## 2018-12-15 NOTE — Progress Notes (Signed)
Office Visit Note   Patient: Brandon Flores           Date of Birth: 09/28/1945           MRN: [id number] Visit Date: 12/15/2018 Requested by: Eunice Blase, MD [address] [place],  [place] [id number] PCP: Eunice Blase, MD  Subjective: Chief Complaint  Patient presents with  . Lower Back - Pain    Right lower back pain.x 1-2 months.  . Constipation    HPI: He is here with right lower back pain/posterior hip pain.  Symptoms started 1 or 2 months ago, no definite injury but he does a lot of heavy lifting at work.  Pain does not radiate down the leg.  He went to an urgent care and was given an intramuscular injection which did not help.  He is having intermittent constipation possibly from medication.  His back pain wakes him up when he turns over in bed at night.  Ibuprofen 600 mg helps.  Continue to have nocturia.  Not much improvement with Flomax.               ROS: No fevers or chills.  All other systems were reviewed and are negative.  Objective: Vital Signs: BP 130/78 (BP Location: Left Arm, Patient Position: Sitting, Cuff Size: Large)   Pulse 85   Physical Exam:  General:  Alert and oriented, in no acute distress. Pulm:  Breathing unlabored. Psy:  Normal mood, congruent affect. Skin: No visible rash. Low back: Tender to palpation in the right gluteus medius area.  No significant spinous process tenderness.  Negative straight leg raise, lower extremity strength and reflexes are normal.  Imaging: None today.  Assessment & Plan: 1.  Right posterior hip/low back pain, probably muscular strain. -Ibuprofen at night as needed.  Home stretching exercises given.  Suggested physical therapy but he does not want to do that right now.  That would be a future option for him.  2.  Nocturia, probably BPH -Trial of Avodart.   Procedures: No procedures performed  No notes on file     PMFS History: Patient Active Problem List   Diagnosis Date Noted  . Obesity (BMI  30-39.9) 08/31/2016  . Chronic neck pain 08/31/2016  . Cervical radiculopathy 05/03/2016  . HLD (hyperlipidemia) 05/03/2016  . GERD (gastroesophageal reflux disease) 04/25/2016  . Hyperuricemia 04/25/2016  . Low back pain without sciatica 04/25/2016  . Degenerative arthritis of left knee 04/25/2016  . Metatarsalgia of both feet 04/25/2016  . Gout 04/25/2016  . Migraine headache 04/25/2016  . Nuclear sclerosis of both eyes 04/25/2016  . History of colonic polyps 04/25/2016  . VBI (vertebrobasilar insufficiency) 04/25/2016  . Vertigo 03/10/2014  . Generalized convulsive epilepsy ([place]) 11/12/2013  . Choroidal nevus, right 10/12/2013  . Vitreous floaters 10/12/2013   Past Medical History:  Diagnosis Date  . Cervical radiculopathy   . Colon polyp    (2006- adenoma and tubulovillous adenoma; 2010-adenomas; repeat 201)-Dr. Wynetta Emery  . Complex partial seizure disorder ([place]) 10/08/2014  . GERD (gastroesophageal reflux disease)   . Gout   . Hypercholesterolemia   . Lateral epicondylitis    2002  . Migraine    headaches  . Ophthalmalgia    Dr. Gershon Crane  . Orthodontics    Dr. Junius Roads  . Partial epilepsy with impairment of consciousness ([place])   . Plantar fasciitis   . Seizure disorder ([place])    Dr. Sabra Heck ([facility])  . Seizures ([place])   . UTI (urinary  tract infection)   . Vertebrobasilar insufficiency   . Vertigo     Family History  Problem Relation Age of Onset  . Alcohol abuse Father   . COPD Sister   . Cancer Sister        lung  . Seizures Neg Hx   . Colon cancer Neg Hx   . Esophageal cancer Neg Hx   . Rectal cancer Neg Hx   . Stomach cancer Neg Hx     Past Surgical History:  Procedure Laterality Date  . BACK SURGERY    . HEMORROIDECTOMY    . NECK SURGERY     Social History   Occupational History  . Occupation: Therapist, nutritional    Comment: [employer]  Tobacco Use  . Smoking status: Former Smoker    Packs/day: 3.00    Years: 20.00    Pack years: 60.00     Quit date: 04/02/1986    Years since quitting: 32.7  . Smokeless tobacco: Never Used  . Tobacco comment: Quit 1988, was a heavy smoker (20-30/day)  Substance and Sexual Activity  . Alcohol use: No  . Drug use: No  . Sexual activity: Not on file

## 2018-12-24 ENCOUNTER — Encounter: Payer: Self-pay | Admitting: *Deleted

## 2018-12-24 NOTE — Telephone Encounter (Signed)
Letter to appeal done and to MM/NP for signature.

## 2018-12-30 NOTE — Telephone Encounter (Signed)
I faxed over letter to [employer] [phone] for appeal.

## 2019-01-07 NOTE — Telephone Encounter (Signed)
I received letter regards appeal for tier reduction cost for this pt and his tegretol.  CASE [id number].  Approved for formulary exception, tier 4,  Not allowed to have a further reduced copayment, since was approved as formulary exception.  ID [id number].

## 2019-02-13 ENCOUNTER — Telehealth: Payer: Self-pay

## 2019-02-13 DIAGNOSIS — H02831 Dermatochalasis of right upper eyelid: Secondary | ICD-10-CM | POA: Diagnosis not present

## 2019-02-13 DIAGNOSIS — H25813 Combined forms of age-related cataract, bilateral: Secondary | ICD-10-CM | POA: Diagnosis not present

## 2019-02-13 DIAGNOSIS — H02834 Dermatochalasis of left upper eyelid: Secondary | ICD-10-CM | POA: Diagnosis not present

## 2019-02-13 DIAGNOSIS — H00011 Hordeolum externum right upper eyelid: Secondary | ICD-10-CM | POA: Diagnosis not present

## 2019-02-13 MED ORDER — ALLOPURINOL 100 MG PO TABS: 100.0000 mg | ORAL_TABLET | Freq: Every day | ORAL | 3 refills | Status: DC

## 2019-02-13 NOTE — Telephone Encounter (Signed)
Rx sent 

## 2019-02-13 NOTE — Addendum Note (Signed)
Addended by: Hortencia Pilar on: 02/13/2019 11:40 AM   Modules accepted: Orders

## 2019-02-13 NOTE — Telephone Encounter (Signed)
Patient's wife called to get a refill on Allopurinol 100 mg, 1 daily (It is still under Dr. Laurann Montana).

## 2019-04-03 DIAGNOSIS — U071 COVID-19: Secondary | ICD-10-CM

## 2019-04-03 HISTORY — DX: COVID-19: U07.1

## 2019-06-08 ENCOUNTER — Ambulatory Visit (INDEPENDENT_AMBULATORY_CARE_PROVIDER_SITE_OTHER): Payer: Medicare Other | Admitting: Family Medicine

## 2019-06-08 ENCOUNTER — Other Ambulatory Visit: Payer: Self-pay

## 2019-06-08 ENCOUNTER — Encounter: Payer: Self-pay | Admitting: Family Medicine

## 2019-06-08 VITALS — BP 162/96 | HR 66 | Resp 20 | Ht 69.0 in | Wt 249.2 lb

## 2019-06-08 DIAGNOSIS — R030 Elevated blood-pressure reading, without diagnosis of hypertension: Secondary | ICD-10-CM

## 2019-06-08 DIAGNOSIS — R739 Hyperglycemia, unspecified: Secondary | ICD-10-CM

## 2019-06-08 DIAGNOSIS — G40309 Generalized idiopathic epilepsy and epileptic syndromes, not intractable, without status epilepticus: Secondary | ICD-10-CM | POA: Diagnosis not present

## 2019-06-08 DIAGNOSIS — M1A00X0 Idiopathic chronic gout, unspecified site, without tophus (tophi): Secondary | ICD-10-CM

## 2019-06-08 DIAGNOSIS — H8110 Benign paroxysmal vertigo, unspecified ear: Secondary | ICD-10-CM | POA: Diagnosis not present

## 2019-06-08 DIAGNOSIS — E782 Mixed hyperlipidemia: Secondary | ICD-10-CM | POA: Diagnosis not present

## 2019-06-08 DIAGNOSIS — K219 Gastro-esophageal reflux disease without esophagitis: Secondary | ICD-10-CM | POA: Diagnosis not present

## 2019-06-08 DIAGNOSIS — M1712 Unilateral primary osteoarthritis, left knee: Secondary | ICD-10-CM | POA: Diagnosis not present

## 2019-06-08 DIAGNOSIS — Z86010 Personal history of colonic polyps: Secondary | ICD-10-CM

## 2019-06-08 DIAGNOSIS — E559 Vitamin D deficiency, unspecified: Secondary | ICD-10-CM

## 2019-06-08 DIAGNOSIS — E669 Obesity, unspecified: Secondary | ICD-10-CM

## 2019-06-08 DIAGNOSIS — K5900 Constipation, unspecified: Secondary | ICD-10-CM | POA: Diagnosis not present

## 2019-06-08 DIAGNOSIS — Z0000 Encounter for general adult medical examination without abnormal findings: Secondary | ICD-10-CM

## 2019-06-08 DIAGNOSIS — Z125 Encounter for screening for malignant neoplasm of prostate: Secondary | ICD-10-CM | POA: Diagnosis not present

## 2019-06-08 DIAGNOSIS — R351 Nocturia: Secondary | ICD-10-CM | POA: Diagnosis not present

## 2019-06-08 MED ORDER — MECLIZINE HCL 25 MG PO TABS: 25.0000 mg | ORAL_TABLET | Freq: Three times a day (TID) | ORAL | 6 refills | Status: AC | PRN

## 2019-06-08 NOTE — Progress Notes (Signed)
Office Visit Note   Patient: Brandon Flores           Date of Birth: 07-06-1945           MRN: [id number] Visit Date: 06/08/2019 Requested by: Eunice Blase, MD [address] [place],  [place] [address] PCP: Eunice Blase, MD  Subjective: Chief Complaint  Patient presents with  . Medicare Wellness    HPI: For a wellness exam.  He is complaining of constipation for the past few months.  Difficulty with bowel movement on a daily basis.  He does not eat any green vegetables.  He has been having increasing chronic low back pain.  He had surgery per Dr. Ellene Route about 20 years ago.  Pain when he first gets up, better after moving around and working.  No radicular symptoms.  He has intermittent dizziness which responds well to meclizine.  He needs a refill.  Urinary symptoms are much better with Avodart.  He has a history of gout for which he takes allopurinol 100 mg daily.  He has not had his uric acid checked in a while.  He does note occasional pain in his foot in addition to both knees and his low back.  He has a history of multiple colon polyps.  He had colonoscopy this past year and will need another one in about 3 years.  Previous labs have been notable for hyperglycemia.  He states that he drinks multiple cans of Coke on a daily basis.  He is trying to taper down.  No family history of diabetes.  Blood pressure is elevated today but it has been mostly in normal range when he checks it at home.  He is managed by neurology for his epilepsy.  Eye exams and dental exams are up-to-date.               ROS:   All other systems were reviewed and are negative.  Objective: Vital Signs: BP (!) 162/96 (Cuff Size: Large)   Pulse 66   Resp 20   Ht 5\' 9"  (1.753 m)   Wt 249 lb 3.2 oz (113 kg)   SpO2 96%   BMI 36.80 kg/m   Physical Exam:  General:  Alert and oriented, in no acute distress. Pulm:  Breathing unlabored. Psy:  Normal mood, congruent affect. Skin: No suspicious  lesions. HEENT: He has a rubber hearing aid piece stuck inside his right ear canal.  This was successfully removed.  Left ear canal looks clear.  Neck has no lymphadenopathy, 2+ carotid pulses and no bruits. CV: Regular rate and rhythm without murmurs, rubs, or gallops.  No peripheral edema.  2+ radial and posterior tibial pulses. Lungs: Clear to auscultation throughout with no wheezing or areas of consolidation. Abd: Bowel sounds are active, no hepatosplenomegaly or masses.  Soft and nontender.  No audible bruits.  No evidence of ascites. Low back: He has some tenderness near the SI joints and in the midline lower lumbar spine.   Imaging: None today  Assessment & Plan: 1.  Wellness examination -Labs today.  2.  Chronic back pain -We will check uric acid level and vitamin D level.  If both normal, consider x-rays and potentially referral to Dr. Ernestina Patches for facet injections or SI joint injections.  3.  Elevated blood pressure without hypertension -He will continue to monitor.  He is also starting magnesium for his constipation and this should help his blood pressure too.  4.  Gout -Uric acid level today.  5.  Hyperglycemia -A1c today.  May need to make some significant dietary changes if elevated.  6.  Colon polyps -Colonoscopy in 3 years.  7.  Constipation -Start using daily magnesium.  Increase fiber intake if possible.     Procedures: No procedures performed  No notes on file     PMFS History: Patient Active Problem List   Diagnosis Date Noted  . Obesity (BMI 30-39.9) 08/31/2016  . Chronic neck pain 08/31/2016  . Cervical radiculopathy 05/03/2016  . HLD (hyperlipidemia) 05/03/2016  . GERD (gastroesophageal reflux disease) 04/25/2016  . Hyperuricemia 04/25/2016  . Low back pain without sciatica 04/25/2016  . Degenerative arthritis of left knee 04/25/2016  . Metatarsalgia of both feet 04/25/2016  . Gout 04/25/2016  . Migraine headache 04/25/2016  . Nuclear  sclerosis of both eyes 04/25/2016  . History of colonic polyps 04/25/2016  . VBI (vertebrobasilar insufficiency) 04/25/2016  . Vertigo 03/10/2014  . Generalized convulsive epilepsy ([place]) 11/12/2013  . Choroidal nevus, right 10/12/2013  . Vitreous floaters 10/12/2013   Past Medical History:  Diagnosis Date  . Cervical radiculopathy   . Colon polyp    (2006- adenoma and tubulovillous adenoma; 2010-adenomas; repeat 201)-Dr. Wynetta Emery  . Complex partial seizure disorder ([place]) 10/08/2014  . GERD (gastroesophageal reflux disease)   . Gout   . Hypercholesterolemia   . Lateral epicondylitis    2002  . Migraine    headaches  . Ophthalmalgia    Dr. Gershon Crane  . Orthodontics    Dr. Junius Roads  . Partial epilepsy with impairment of consciousness ([place])   . Plantar fasciitis   . Seizure disorder ([place])    Dr. Sabra Heck ([facility])  . Seizures ([place])   . UTI (urinary tract infection)   . Vertebrobasilar insufficiency   . Vertigo     Family History  Problem Relation Age of Onset  . Alcohol abuse Father   . COPD Sister   . Cancer Sister        lung  . Seizures Neg Hx   . Colon cancer Neg Hx   . Esophageal cancer Neg Hx   . Rectal cancer Neg Hx   . Stomach cancer Neg Hx     Past Surgical History:  Procedure Laterality Date  . BACK SURGERY    . HEMORROIDECTOMY    . NECK SURGERY     Social History   Occupational History  . Occupation: Therapist, nutritional    Comment: [employer]  Tobacco Use  . Smoking status: Former Smoker    Packs/day: 3.00    Years: 20.00    Pack years: 60.00    Quit date: 04/02/1986    Years since quitting: 33.2  . Smokeless tobacco: Never Used  . Tobacco comment: Quit 1988, was a heavy smoker (20-30/day)  Substance and Sexual Activity  . Alcohol use: No  . Drug use: No  . Sexual activity: Not on file

## 2019-06-09 ENCOUNTER — Telehealth: Payer: Self-pay | Admitting: Family Medicine

## 2019-06-09 DIAGNOSIS — E785 Hyperlipidemia, unspecified: Secondary | ICD-10-CM

## 2019-06-09 DIAGNOSIS — R7989 Other specified abnormal findings of blood chemistry: Secondary | ICD-10-CM

## 2019-06-09 DIAGNOSIS — E559 Vitamin D deficiency, unspecified: Secondary | ICD-10-CM

## 2019-06-09 DIAGNOSIS — R972 Elevated prostate specific antigen [PSA]: Secondary | ICD-10-CM

## 2019-06-09 DIAGNOSIS — R7982 Elevated C-reactive protein (CRP): Secondary | ICD-10-CM

## 2019-06-09 LAB — CBC WITH DIFFERENTIAL/PLATELET
Absolute Monocytes: 693 cells/uL (ref 200–950)
Basophils Absolute: 38 cells/uL (ref 0–200)
Basophils Relative: 0.6 %
Eosinophils Absolute: 202 cells/uL (ref 15–500)
Eosinophils Relative: 3.2 %
HCT: 46.4 % (ref 38.5–50.0)
Hemoglobin: 16 g/dL (ref 13.2–17.1)
Lymphs Abs: 2407 cells/uL (ref 850–3900)
MCH: 31.6 pg (ref 27.0–33.0)
MCHC: 34.5 g/dL (ref 32.0–36.0)
MCV: 91.7 fL (ref 80.0–100.0)
MPV: 10.6 fL (ref 7.5–12.5)
Monocytes Relative: 11 %
Neutro Abs: 2961 cells/uL (ref 1500–7800)
Neutrophils Relative %: 47 %
Platelets: 241 10*3/uL (ref 140–400)
RBC: 5.06 10*6/uL (ref 4.20–5.80)
RDW: 12.4 % (ref 11.0–15.0)
Total Lymphocyte: 38.2 %
WBC: 6.3 10*3/uL (ref 3.8–10.8)

## 2019-06-09 LAB — COMPREHENSIVE METABOLIC PANEL
AG Ratio: 1.4 (calc) (ref 1.0–2.5)
ALT: 12 U/L (ref 9–46)
AST: 13 U/L (ref 10–35)
Albumin: 4.2 g/dL (ref 3.6–5.1)
Alkaline phosphatase (APISO): 89 U/L (ref 35–144)
BUN: 14 mg/dL (ref 7–25)
CO2: 29 mmol/L (ref 20–32)
Calcium: 9.5 mg/dL (ref 8.6–10.3)
Chloride: 100 mmol/L (ref 98–110)
Creat: 0.98 mg/dL (ref 0.70–1.18)
Globulin: 2.9 g/dL (calc) (ref 1.9–3.7)
Glucose, Bld: 91 mg/dL (ref 65–99)
Potassium: 4.9 mmol/L (ref 3.5–5.3)
Sodium: 138 mmol/L (ref 135–146)
Total Bilirubin: 0.4 mg/dL (ref 0.2–1.2)
Total Protein: 7.1 g/dL (ref 6.1–8.1)

## 2019-06-09 LAB — LIPID PANEL
Cholesterol: 248 mg/dL — ABNORMAL HIGH (ref <200)
HDL: 43 mg/dL (ref >=40)
LDL Cholesterol (Calc): 158 mg/dL (calc) — ABNORMAL HIGH
Non-HDL Cholesterol (Calc): 205 mg/dL (calc) — ABNORMAL HIGH (ref <130)
Total CHOL/HDL Ratio: 5.8 (calc) — ABNORMAL HIGH (ref <5.0)
Triglycerides: 304 mg/dL — ABNORMAL HIGH (ref <150)

## 2019-06-09 LAB — THYROID PANEL WITH TSH
Free Thyroxine Index: 1.2 — ABNORMAL LOW (ref 1.4–3.8)
T3 Uptake: 27 % (ref 22–35)
T4, Total: 4.3 ug/dL — ABNORMAL LOW (ref 4.9–10.5)
TSH: 9.01 mIU/L — ABNORMAL HIGH (ref 0.40–4.50)

## 2019-06-09 LAB — VITAMIN D 25 HYDROXY (VIT D DEFICIENCY, FRACTURES): Vit D, 25-Hydroxy: 24 ng/mL — ABNORMAL LOW (ref 30–100)

## 2019-06-09 LAB — PSA: PSA: 5.5 ng/mL — ABNORMAL HIGH (ref <=4.0)

## 2019-06-09 LAB — HEMOGLOBIN A1C
Hgb A1c MFr Bld: 5.2 % of total Hgb (ref <5.7)
Mean Plasma Glucose: 103 (calc)
eAG (mmol/L): 5.7 (calc)

## 2019-06-09 LAB — URIC ACID: Uric Acid, Serum: 6.6 mg/dL (ref 4.0–8.0)

## 2019-06-09 LAB — HIGH SENSITIVITY CRP: hs-CRP: >10 mg/L — ABNORMAL HIGH

## 2019-06-09 NOTE — Telephone Encounter (Signed)
Labs show:  Vitamin D is low at 24.  I recommend taking vitamin D3 at 5,000 IU daily.  Will recheck in 6 months.  Prostate PSA is elevated at 5.5.  Could be from prostate enlargement.  Always need to be concerned about prostate cancer, though.  I recommend rechecking in 3 months.  If still elevated, then referral to urologist.  Thyroid TSH is abnormal at 9.01.  This indicates hypothyroidism.  Could either start treatment with Synthroid, or recheck in 3 months to see if still abnormal.  Lipids are abnormal.  Important to limit intake of breads; pastas; cereals; sugars/sweets (including Coke).  Recheck in 6 months.  CRP (inflammation marker) is elevated.  Reason uncertain, but could be related to thyroid or prostate.  Will recheck in 3 months.

## 2019-06-11 NOTE — Telephone Encounter (Signed)
I called and notified the patient of his results and instructions. He wrote down the instructions and was able to accurately read them back to me. Appointment made for repeat PSA, CRP and thyroid tests for 09/15/19 (nurse visit) between 4-4:30. Mailing a copy of the labs to him per request.

## 2019-06-19 DIAGNOSIS — M25512 Pain in left shoulder: Secondary | ICD-10-CM | POA: Diagnosis not present

## 2019-06-23 ENCOUNTER — Other Ambulatory Visit: Payer: Self-pay | Admitting: Family Medicine

## 2019-06-23 DIAGNOSIS — R7989 Other specified abnormal findings of blood chemistry: Secondary | ICD-10-CM

## 2019-06-23 MED ORDER — LEVOTHYROXINE SODIUM 50 MCG PO TABS: 50.0000 ug | ORAL_TABLET | Freq: Every day | ORAL | 3 refills | Status: DC

## 2019-06-23 NOTE — Progress Notes (Signed)
Pt wants to try thyroid treatment.  Will start synthroid 50 mcg daily.  Recheck labs in 8-12 weeks.

## 2019-06-30 ENCOUNTER — Ambulatory Visit (INDEPENDENT_AMBULATORY_CARE_PROVIDER_SITE_OTHER): Payer: Medicare Other | Admitting: Family Medicine

## 2019-06-30 ENCOUNTER — Encounter: Payer: Self-pay | Admitting: Family Medicine

## 2019-06-30 ENCOUNTER — Other Ambulatory Visit: Payer: Self-pay

## 2019-06-30 ENCOUNTER — Ambulatory Visit: Payer: Self-pay

## 2019-06-30 DIAGNOSIS — M25512 Pain in left shoulder: Secondary | ICD-10-CM

## 2019-06-30 DIAGNOSIS — S20469A Insect bite (nonvenomous) of unspecified back wall of thorax, initial encounter: Secondary | ICD-10-CM

## 2019-06-30 DIAGNOSIS — M25511 Pain in right shoulder: Secondary | ICD-10-CM

## 2019-06-30 DIAGNOSIS — W57XXXA Bitten or stung by nonvenomous insect and other nonvenomous arthropods, initial encounter: Secondary | ICD-10-CM | POA: Diagnosis not present

## 2019-06-30 MED ORDER — DOXYCYCLINE HYCLATE 100 MG PO CAPS: 100.0000 mg | ORAL_CAPSULE | Freq: Two times a day (BID) | ORAL | 0 refills | Status: DC

## 2019-06-30 MED ORDER — HYDROCODONE-ACETAMINOPHEN 5-325 MG PO TABS: 1.0000 | ORAL_TABLET | Freq: Four times a day (QID) | ORAL | 0 refills | Status: DC | PRN

## 2019-06-30 NOTE — Progress Notes (Signed)
I saw and examined the patient with Dr. Mayer Masker and agree with assessment and plan as outlined.    Bilateral shoulder pain for the past couple weeks after doing repetitive lifting of mulch bags.  Strength is still intact.  Both shoulders injected in the subacromial space today.  Consider MRI scan if symptoms persist.  He also has a possible tick bite on his back.  Doxycycline prescription given.

## 2019-06-30 NOTE — Progress Notes (Signed)
Brandon Flores - 74 y.o. male MRN [id number]  Date of birth: [birth year]  Office Visit Note: Visit Date: 06/30/2019 PCP: Eunice Blase, MD Referred by: Eunice Blase, MD  Subjective: Chief Complaint  Patient presents with  . Left Shoulder - Pain  . Right Shoulder - Pain   HPI: Brandon Flores is a 74 y.o. male who comes in today with bilateral shoulder pain, left worse than right.  His left shoulder has been hurting for the past two weeks. He thinks that he irritated his shoulder when he was loading and unloading 30 bags of 50 lb mulch 2 weeks ago. Pain over lateral shoulder, worse with overhead motions. He tried meloxicam daily which did not help much. His left shoulder pain is 8-9/10 and his left shoulder pain is 1/10- left shoulder pain only bothers him when sleeping at night. No numbness, tingling, or shooting pain down either extremity.    ROS Otherwise per HPI.  Assessment & Plan: Visit Diagnoses:  1. Bilateral shoulder pain, unspecified chronicity   2. Tick bite, initial encounter     Plan: Bilateral supraspinatus tendinopathy after heavy lifting two weeks prior. Good strength and ROM on exam. Patient elected for bilateral subacromial injections to help with pain. He also had a tick on back that was removed in office. Doxycycline prescription given.   Meds & Orders:  Meds ordered this encounter  Medications  . doxycycline (VIBRAMYCIN) 100 MG capsule    Sig: Take 1 capsule (100 mg total) by mouth 2 (two) times daily.    Dispense:  20 capsule    Refill:  0  . HYDROcodone-acetaminophen (NORCO/VICODIN) 5-325 MG tablet    Sig: Take 1 tablet by mouth every 6 (six) hours as needed for moderate pain.    Dispense:  30 tablet    Refill:  0    Orders Placed This Encounter  Procedures  . XR Shoulder Left  . XR Shoulder Right    Follow-up: No follow-ups on file.   Procedures: Procedure performed: bilateral subacromial corticosteroid injection  Consent obtained and  verified. Time-out conducted. Noted no overlying erythema, induration, or other signs of local infection. The left and right posterior subacromial spaces were palpated and marked. The overlying skin was prepped in a sterile fashion. Topical analgesic spray: Ethyl chloride. Joint: both left and right subacromial Needle: 25 gauge, 1.5 inch Completed without difficulty. Meds: 3 cc 1% lidocaine without epinephrine, 40 mg methylprednisolone into each shoulder    Clinical History: No specialty comments available.   He reports that he quit smoking about 33 years ago. He has a 60.00 pack-year smoking history. He has never used smokeless tobacco.  Recent Labs    06/08/19 1358  HGBA1C 5.2  LABURIC 6.6    Objective:  VS:  HT:    WT:   BMI:     BP:   HR: bpm  TEMP: ( )  RESP:  Physical Exam  PHYSICAL EXAM: Gen: NAD, alert, cooperative with exam, well-appearing HEENT: clear conjunctiva,  CV:  no edema, capillary refill brisk, normal rate Resp: non-labored Skin: no rashes, normal turgor  Neuro: no gross deficits.  Psych:  alert and oriented  Ortho Exam  Left Shoulder: Inspection reveals no obvious deformity, atrophy, or asymmetry. No bruising. No swelling Palpation is normal with no TTP over [facility] joint or bicipital groove. TTP over lateral acromion Full ROM in flexion, abduction, internal/external rotation- reports pain with flexion and abduction above 90 degrees NV intact distally Normal scapular function  observed. Special Tests:  - Impingement: positive Hawkins - Supraspinatous: Negative empty can.  5/5 strength with resisted flexion at 20 degrees- some pain with resistance - Infraspinatous/Teres Minor: 5/5 strength with ER - Subscapularis: 5/5 strength with IR - Biceps tendon: Negative Speeds, Yerrgason's  - Labrum: Negative Obriens - AC Joint: Negative cross arm - painful arc   Right Shoulder: Inspection reveals no obvious deformity, atrophy, or asymmetry. No bruising. No  swelling Palpation is normal with no TTP over [facility] joint or bicipital groove. Full ROM in flexion, abduction, internal/external rotation- some pain with flexion and abduction above 90 degrees NV intact distally Normal scapular function observed. Special Tests:  - Impingement: Neg Hawkins, neers, empty can sign. - Supraspinatous: 5/5 strength with resisted flexion at 20 degrees- some pain with resistance - Infraspinatous/Teres Minor: 5/5 strength with ER - Subscapularis: 5/5 strength with IR - Biceps tendon: Negative Speeds, Yerrgason's  - Labrum: Negative Obriens, negative clunk, good stability - AC Joint: Negative cross arm    Imaging: No results found.  Past Medical/Family/Surgical/Social History: Medications & Allergies reviewed per EMR, new medications updated. Patient Active Problem List   Diagnosis Date Noted  . Obesity (BMI 30-39.9) 08/31/2016  . Chronic neck pain 08/31/2016  . Cervical radiculopathy 05/03/2016  . HLD (hyperlipidemia) 05/03/2016  . GERD (gastroesophageal reflux disease) 04/25/2016  . Hyperuricemia 04/25/2016  . Low back pain without sciatica 04/25/2016  . Degenerative arthritis of left knee 04/25/2016  . Metatarsalgia of both feet 04/25/2016  . Gout 04/25/2016  . Migraine headache 04/25/2016  . Nuclear sclerosis of both eyes 04/25/2016  . History of colonic polyps 04/25/2016  . VBI (vertebrobasilar insufficiency) 04/25/2016  . Vertigo 03/10/2014  . Generalized convulsive epilepsy ([place]) 11/12/2013  . Choroidal nevus, right 10/12/2013  . Vitreous floaters 10/12/2013   Past Medical History:  Diagnosis Date  . Cervical radiculopathy   . Colon polyp    (2006- adenoma and tubulovillous adenoma; 2010-adenomas; repeat 201)-Dr. Wynetta Emery  . Complex partial seizure disorder ([place]) 10/08/2014  . GERD (gastroesophageal reflux disease)   . Gout   . Hypercholesterolemia   . Lateral epicondylitis    2002  . Migraine    headaches  . Ophthalmalgia    Dr.  Gershon Crane  . Orthodontics    Dr. Junius Roads  . Partial epilepsy with impairment of consciousness ([place])   . Plantar fasciitis   . Seizure disorder ([place])    Dr. Sabra Heck ([facility])  . Seizures ([place])   . UTI (urinary tract infection)   . Vertebrobasilar insufficiency   . Vertigo    Family History  Problem Relation Age of Onset  . Alcohol abuse Father   . COPD Sister   . Lung cancer Sister   . Cancer Sister   . Lung cancer Brother   . Cancer Brother   . Cancer Sister        lung  . Lung cancer Sister   . Lung cancer Brother   . Cancer Brother   . Seizures Neg Hx   . Colon cancer Neg Hx   . Esophageal cancer Neg Hx   . Rectal cancer Neg Hx   . Stomach cancer Neg Hx    Past Surgical History:  Procedure Laterality Date  . BACK SURGERY    . HEMORROIDECTOMY    . NECK SURGERY     Social History   Occupational History  . Occupation: Therapist, nutritional    Comment: [employer]  Tobacco Use  . Smoking status: Former Smoker  Packs/day: 3.00    Years: 20.00    Pack years: 60.00    Quit date: 04/02/1986    Years since quitting: 33.2  . Smokeless tobacco: Never Used  . Tobacco comment: Quit 1988, was a heavy smoker (20-30/day)  Substance and Sexual Activity  . Alcohol use: No  . Drug use: No  . Sexual activity: Not on file

## 2019-07-18 ENCOUNTER — Encounter: Payer: Self-pay | Admitting: Family Medicine

## 2019-07-18 DIAGNOSIS — M25512 Pain in left shoulder: Secondary | ICD-10-CM

## 2019-07-18 DIAGNOSIS — G8929 Other chronic pain: Secondary | ICD-10-CM

## 2019-07-20 NOTE — Addendum Note (Signed)
Addended by: Hortencia Pilar on: 07/20/2019 11:49 AM   Modules accepted: Orders

## 2019-07-21 ENCOUNTER — Telehealth: Payer: Self-pay | Admitting: Family Medicine

## 2019-07-21 NOTE — Telephone Encounter (Signed)
Patient's wife called.   She is requesting a call back to discuss the treatment plan for here husband. She previously messaged on Dr.Hilts on MyChart but they are still terribly confused on some areas.   Call back: [phone]

## 2019-07-21 NOTE — Telephone Encounter (Signed)
Per Dr. Junius Roads, the confusion has been cleared up through [place] since the patient's wife made this phone call.

## 2019-08-06 DIAGNOSIS — J069 Acute upper respiratory infection, unspecified: Secondary | ICD-10-CM | POA: Diagnosis not present

## 2019-08-19 ENCOUNTER — Ambulatory Visit
Admission: RE | Admit: 2019-08-19 | Discharge: 2019-08-19 | Disposition: A | Discharge status: Home / Self Care | Payer: Medicare Other | Source: Ambulatory Visit | Attending: Family Medicine | Admitting: Family Medicine

## 2019-08-19 ENCOUNTER — Other Ambulatory Visit: Payer: Self-pay

## 2019-08-19 DIAGNOSIS — M75102 Unspecified rotator cuff tear or rupture of left shoulder, not specified as traumatic: Secondary | ICD-10-CM | POA: Diagnosis not present

## 2019-08-19 DIAGNOSIS — M25512 Pain in left shoulder: Secondary | ICD-10-CM

## 2019-08-21 ENCOUNTER — Telehealth: Payer: Self-pay | Admitting: Family Medicine

## 2019-08-21 NOTE — Telephone Encounter (Signed)
Left shoulder MRI scan shows a 1 cm front to back supraspinatus tear with retraction of less than 1 cm.

## 2019-09-15 ENCOUNTER — Ambulatory Visit: Payer: Medicare Other | Admitting: Radiology

## 2019-09-15 ENCOUNTER — Other Ambulatory Visit: Payer: Self-pay

## 2019-09-15 ENCOUNTER — Telehealth: Payer: Self-pay | Admitting: Family Medicine

## 2019-09-15 DIAGNOSIS — R972 Elevated prostate specific antigen [PSA]: Secondary | ICD-10-CM

## 2019-09-15 DIAGNOSIS — R7982 Elevated C-reactive protein (CRP): Secondary | ICD-10-CM | POA: Diagnosis not present

## 2019-09-15 DIAGNOSIS — R7989 Other specified abnormal findings of blood chemistry: Secondary | ICD-10-CM

## 2019-09-15 DIAGNOSIS — E785 Hyperlipidemia, unspecified: Secondary | ICD-10-CM | POA: Diagnosis not present

## 2019-09-15 DIAGNOSIS — E559 Vitamin D deficiency, unspecified: Secondary | ICD-10-CM

## 2019-09-15 DIAGNOSIS — E7849 Other hyperlipidemia: Secondary | ICD-10-CM | POA: Diagnosis not present

## 2019-09-15 MED ORDER — HYDROCODONE-ACETAMINOPHEN 5-325 MG PO TABS: 1.0000 | ORAL_TABLET | Freq: Four times a day (QID) | ORAL | 0 refills | Status: DC | PRN

## 2019-09-15 NOTE — Telephone Encounter (Signed)
I spoke to the patient about his left shoulder rotator cuff tear.  He is managing his pain, not interested in surgery right now.  If his pain worsens he will let me know.

## 2019-09-16 ENCOUNTER — Encounter: Payer: Self-pay | Admitting: Family Medicine

## 2019-09-16 ENCOUNTER — Telehealth: Payer: Self-pay | Admitting: Family Medicine

## 2019-09-16 DIAGNOSIS — R7982 Elevated C-reactive protein (CRP): Secondary | ICD-10-CM

## 2019-09-16 DIAGNOSIS — R972 Elevated prostate specific antigen [PSA]: Secondary | ICD-10-CM

## 2019-09-16 LAB — THYROID PEROXIDASE ANTIBODY: Thyroperoxidase Ab SerPl-aCnc: <1 IU/mL (ref <9)

## 2019-09-16 LAB — HIGH SENSITIVITY CRP: hs-CRP: >10 mg/L — ABNORMAL HIGH

## 2019-09-16 LAB — THYROID PANEL WITH TSH
Free Thyroxine Index: 1.2 — ABNORMAL LOW (ref 1.4–3.8)
T3 Uptake: 29 % (ref 22–35)
T4, Total: 4.1 ug/dL — ABNORMAL LOW (ref 4.9–10.5)
TSH: 4.53 mIU/L — ABNORMAL HIGH (ref 0.40–4.50)

## 2019-09-16 LAB — LIPID PANEL
Cholesterol: 226 mg/dL — ABNORMAL HIGH (ref <200)
HDL: 37 mg/dL — ABNORMAL LOW (ref >=40)
LDL Cholesterol (Calc): 150 mg/dL (calc) — ABNORMAL HIGH
Non-HDL Cholesterol (Calc): 189 mg/dL (calc) — ABNORMAL HIGH (ref <130)
Total CHOL/HDL Ratio: 6.1 (calc) — ABNORMAL HIGH (ref <5.0)
Triglycerides: 251 mg/dL — ABNORMAL HIGH (ref <150)

## 2019-09-16 LAB — EXTRA LAV TOP TUBE

## 2019-09-16 LAB — PSA, TOTAL AND FREE
PSA, % Free: 11 % (calc) — ABNORMAL LOW (ref >25)
PSA, Free: 0.6 ng/mL
PSA, Total: 5.5 ng/mL — ABNORMAL HIGH (ref <=4.0)

## 2019-09-16 LAB — VITAMIN D 25 HYDROXY (VIT D DEFICIENCY, FRACTURES): Vit D, 25-Hydroxy: 40 ng/mL (ref 30–100)

## 2019-09-16 NOTE — Telephone Encounter (Signed)
Sent this to wife in [place] and told her to call me if she had any questions

## 2019-09-16 NOTE — Telephone Encounter (Signed)
Labs are notable for the following:  Thyroid TSH looks better at 4.53, but is still higher than target range.  If he is still struggling with fatigue, we could increase his Synthroid dosage and recheck labs in another 2 to 3 months.  Lipids remain abnormal, but have improved since last time.  Keep working on diet and numbers should steadily improve.  Recheck in about 6 months.  Prostate PSA remains elevated at 5.5.  I recommend consultation with a urologist to further evaluate.  I will make referral if Brandon Flores is in agreement.  C-reactive protein, inflammation marker, remains elevated.  It is possible that this is related to prostate.  If the urologist does not feel that the prostate is an issue, we may do additional lab testing.

## 2019-09-17 ENCOUNTER — Other Ambulatory Visit: Payer: Self-pay | Admitting: Family Medicine

## 2019-09-17 DIAGNOSIS — R972 Elevated prostate specific antigen [PSA]: Secondary | ICD-10-CM

## 2019-10-20 ENCOUNTER — Encounter: Payer: Self-pay | Admitting: Family Medicine

## 2019-10-21 ENCOUNTER — Encounter: Payer: Self-pay | Admitting: Family Medicine

## 2019-10-21 ENCOUNTER — Other Ambulatory Visit: Payer: Self-pay

## 2019-10-21 ENCOUNTER — Ambulatory Visit (INDEPENDENT_AMBULATORY_CARE_PROVIDER_SITE_OTHER): Payer: Medicare Other | Admitting: Family Medicine

## 2019-10-21 DIAGNOSIS — G8929 Other chronic pain: Secondary | ICD-10-CM

## 2019-10-21 DIAGNOSIS — M25512 Pain in left shoulder: Secondary | ICD-10-CM | POA: Diagnosis not present

## 2019-10-21 NOTE — Progress Notes (Signed)
Left shoulder pain is wearing a shoulder strap to help stabalize. Taking hydrocodone for pain.  Pt is having vertigo since yesterday around noon took mezlizine for dizziness, still has it.

## 2019-10-21 NOTE — Progress Notes (Signed)
Office Visit Note   Patient: Brandon Flores           Date of Birth: Jan 27, 1946           MRN: [id number] Visit Date: 10/21/2019 Requested by: Eunice Blase, MD [place],  [place] [address] PCP: Eunice Blase, MD  Subjective: Chief Complaint  Patient presents with  . Left Shoulder - Pain    HPI: He is here with left shoulder pain.  Subacromial injection in March gave him complete pain relief for couple months.  It has started hurting again, no known injury.  He also developed vertigo yesterday.  He took some meclizine and is feeling somewhat better but not back to normal.               ROS:   All other systems were reviewed and are negative.  Objective: Vital Signs: There were no vitals taken for this visit.  Physical Exam:  General:  Alert and oriented, in no acute distress. Pulm:  Breathing unlabored. Psy:  Normal mood, congruent affect.  Left shoulder: Full active range of motion.  He has palpable crepitus with passive abduction and internal and external rotation.  Rotator cuff strength is 5/5 throughout.   Imaging: No results found.  Assessment & Plan: 1.  Left shoulder impingement with supraspinatus tear per MRI scan in May -Discussed options and he wants to try another injection.  He would like to avoid surgery if possible.     Procedures: Left shoulder subacromial injection: After sterile prep with Betadine, injected 3 cc 1% lidocaine without epinephrine and 40 mg methylprednisolone from posterior approach.   PMFS History: Patient Active Problem List   Diagnosis Date Noted  . Obesity (BMI 30-39.9) 08/31/2016  . Chronic neck pain 08/31/2016  . Cervical radiculopathy 05/03/2016  . HLD (hyperlipidemia) 05/03/2016  . GERD (gastroesophageal reflux disease) 04/25/2016  . Hyperuricemia 04/25/2016  . Low back pain without sciatica 04/25/2016  . Degenerative arthritis of left knee 04/25/2016  . Metatarsalgia of both feet 04/25/2016  . Gout  04/25/2016  . Migraine headache 04/25/2016  . Nuclear sclerosis of both eyes 04/25/2016  . History of colonic polyps 04/25/2016  . VBI (vertebrobasilar insufficiency) 04/25/2016  . Vertigo 03/10/2014  . Generalized convulsive epilepsy ([place]) 11/12/2013  . Choroidal nevus, right 10/12/2013  . Vitreous floaters 10/12/2013   Past Medical History:  Diagnosis Date  . Cervical radiculopathy   . Colon polyp    (2006- adenoma and tubulovillous adenoma; 2010-adenomas; repeat 201)-Dr. Wynetta Emery  . Complex partial seizure disorder ([place]) 10/08/2014  . GERD (gastroesophageal reflux disease)   . Gout   . Hypercholesterolemia   . Lateral epicondylitis    2002  . Migraine    headaches  . Ophthalmalgia    Dr. Gershon Crane  . Orthodontics    Dr. Junius Roads  . Partial epilepsy with impairment of consciousness ([place])   . Plantar fasciitis   . Seizure disorder ([facility])    Dr. Sabra Heck ([facility])  . Seizures ([place])   . UTI (urinary tract infection)   . Vertebrobasilar insufficiency   . Vertigo     Family History  Problem Relation Age of Onset  . Alcohol abuse Father   . COPD Sister   . Lung cancer Sister   . Cancer Sister   . Lung cancer Brother   . Cancer Brother   . Cancer Sister        lung  . Lung cancer Sister   . Lung cancer Brother   .  Cancer Brother   . Seizures Neg Hx   . Colon cancer Neg Hx   . Esophageal cancer Neg Hx   . Rectal cancer Neg Hx   . Stomach cancer Neg Hx     Past Surgical History:  Procedure Laterality Date  . BACK SURGERY    . HEMORROIDECTOMY    . NECK SURGERY     Social History   Occupational History  . Occupation: Therapist, nutritional    Comment: [employer]  Tobacco Use  . Smoking status: Former Smoker    Packs/day: 3.00    Years: 20.00    Pack years: 60.00    Quit date: 04/02/1986    Years since quitting: 33.5  . Smokeless tobacco: Never Used  . Tobacco comment: Quit 1988, was a heavy smoker (20-30/day)  Vaping Use  . Vaping Use: Never used  Substance and  Sexual Activity  . Alcohol use: No  . Drug use: No  . Sexual activity: Not on file

## 2019-10-30 DIAGNOSIS — N403 Nodular prostate with lower urinary tract symptoms: Secondary | ICD-10-CM | POA: Diagnosis not present

## 2019-10-30 DIAGNOSIS — R3915 Urgency of urination: Secondary | ICD-10-CM | POA: Diagnosis not present

## 2019-10-30 DIAGNOSIS — R972 Elevated prostate specific antigen [PSA]: Secondary | ICD-10-CM | POA: Diagnosis not present

## 2019-10-30 DIAGNOSIS — R3912 Poor urinary stream: Secondary | ICD-10-CM | POA: Diagnosis not present

## 2019-10-30 DIAGNOSIS — N5201 Erectile dysfunction due to arterial insufficiency: Secondary | ICD-10-CM | POA: Diagnosis not present

## 2019-11-11 ENCOUNTER — Ambulatory Visit (INDEPENDENT_AMBULATORY_CARE_PROVIDER_SITE_OTHER): Payer: Medicare Other | Admitting: Neurology

## 2019-11-11 ENCOUNTER — Encounter: Payer: Self-pay | Admitting: Neurology

## 2019-11-11 VITALS — BP 129/73 | HR 68 | Ht 69.0 in | Wt 243.0 lb

## 2019-11-11 DIAGNOSIS — G40309 Generalized idiopathic epilepsy and epileptic syndromes, not intractable, without status epilepticus: Secondary | ICD-10-CM

## 2019-11-11 MED ORDER — CARBAMAZEPINE 200 MG PO TABS: 400.0000 mg | ORAL_TABLET | Freq: Three times a day (TID) | ORAL | 3 refills | Status: DC

## 2019-11-11 NOTE — Progress Notes (Signed)
PATIENT: [name] DOB: [birth year]  REASON FOR VISIT: follow up HISTORY FROM: patient  HISTORY OF PRESENT ILLNESS: Today 11/11/19 [name] is a 74 year old male with history of seizures he remains on Tegretol, requires brand-name. No recurrent seizures.  Continues to work full-time, drives a car without difficulty.  Is tolerating the medication well.  Brand-name is expensive, about $600 every 3 months.  Previously, with generic he reports breakthrough seizures.  Has an elevated PSA, seeing urology.  Also shoulder pain, worse on the left, seeing PCP.  We have previously tried to get a financial break from the cost of Tegretol, unsuccessfully.  Presents today for evaluation accompanied by his wife.  HISTORY 11/10/2018 SS: [name] is a 74 year old male with history of seizures.  He remains on Tegretol 400 mg 3 times a day.  He does require brand name Tegretol.  He reports compliance with Tegretol.  He reports he may have a "small event" in the evening when he is watching TV after a very long day.  He describes this as a wave traveling from his toes to the rest of his body. There is no full body shaking.  He says it lasts less than a minute.  He says he is aware of it, he fights it and it goes away.  He says this is been typical for many years.  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  He denies any new problems or concerns.  He presents today for follow-up accompanied by his wife.   REVIEW OF SYSTEMS: Out of a complete 14 system review of symptoms, the patient complains only of the following symptoms, and all other reviewed systems are negative.  N/A  ALLERGIES: No Known Allergies  HOME MEDICATIONS: Outpatient Medications Prior to Visit  Medication Sig Dispense Refill  . allopurinol (ZYLOPRIM) 100 MG tablet Take 1 tablet (100 mg total) by mouth daily. 90 tablet 3  . dutasteride (AVODART) 0.5 MG capsule Take 1 capsule (0.5 mg total)  by mouth every evening. 30 capsule 6  . HYDROcodone-acetaminophen (NORCO/VICODIN) 5-325 MG tablet Take 1 tablet by mouth every 6 (six) hours as needed for moderate pain. 30 tablet 0  . levothyroxine (SYNTHROID) 50 MCG tablet Take 1 tablet (50 mcg total) by mouth daily before breakfast. 30 tablet 3  . meclizine (ANTIVERT) 25 MG tablet Take 1 tablet (25 mg total) by mouth 3 (three) times daily as needed for dizziness. 30 tablet 6  . meloxicam (MOBIC) 15 MG tablet Take 15 mg by mouth daily.    Marland Kitchen OVER THE COUNTER MEDICATION Stool softener with probiotic twice a week.    . tamsulosin (FLOMAX) 0.4 MG CAPS capsule Take 1 capsule (0.4 mg total) by mouth daily after supper. 30 capsule 6  . carbamazepine (TEGRETOL) 200 MG tablet Take 2 tablets (400 mg total) by mouth 3 (three) times daily. Brand is medically necessary 540 tablet 3  . doxycycline (VIBRAMYCIN) 100 MG capsule Take 1 capsule (100 mg total) by mouth 2 (two) times daily. 20 capsule 0   No facility-administered medications prior to visit.    PAST MEDICAL HISTORY: Past Medical History:  Diagnosis Date  . Cervical radiculopathy   . Colon polyp    (2006- adenoma and tubulovillous adenoma; 2010-adenomas; repeat 201)-Dr. Wynetta Emery  . Complex partial seizure disorder ([place]) 10/08/2014  . GERD (gastroesophageal reflux disease)   . Gout   . Hypercholesterolemia   . Lateral epicondylitis  2002  . Migraine    headaches  . Ophthalmalgia    Dr. Gershon Crane  . Orthodontics    Dr. Junius Roads  . Partial epilepsy with impairment of consciousness ([place])   . Plantar fasciitis   . Seizure disorder ([place])    Dr. Sabra Heck ([facility])  . Seizures ([facility])   . UTI (urinary tract infection)   . Vertebrobasilar insufficiency   . Vertigo     PAST SURGICAL HISTORY: Past Surgical History:  Procedure Laterality Date  . BACK SURGERY    . HEMORROIDECTOMY    . NECK SURGERY      FAMILY HISTORY: Family History  Problem Relation Age of Onset  . Alcohol abuse Father   . COPD  Sister   . Lung cancer Sister   . Cancer Sister   . Lung cancer Brother   . Cancer Brother   . Cancer Sister        lung  . Lung cancer Sister   . Lung cancer Brother   . Cancer Brother   . Seizures Neg Hx   . Colon cancer Neg Hx   . Esophageal cancer Neg Hx   . Rectal cancer Neg Hx   . Stomach cancer Neg Hx     SOCIAL HISTORY: Social History   Socioeconomic History  . Marital status: Married    Spouse name: Kizzie Furnish  . Number of children: 2  . Years of education: 68  . Highest education level: Not on file  Occupational History  . Occupation: Therapist, nutritional    Comment: [employer]  Tobacco Use  . Smoking status: Former Smoker    Packs/day: 3.00    Years: 20.00    Pack years: 60.00    Quit date: 04/02/1986    Years since quitting: 33.6  . Smokeless tobacco: Never Used  . Tobacco comment: Quit 1988, was a heavy smoker (20-30/day)  Vaping Use  . Vaping Use: Never used  Substance and Sexual Activity  . Alcohol use: No  . Drug use: No  . Sexual activity: Not on file  Other Topics Concern  . Not on file  Social History Narrative   Occasional glass of caffeine   Patient is right handed.   Lives at home with his wife   They have 6 children together and 15 grandchildren   Social Determinants of Health   Financial Resource Strain:   . Difficulty of Paying Living Expenses:   Food Insecurity:   . Worried About Charity fundraiser in the Last Year:   . Arboriculturist in the Last Year:   Transportation Needs:   . Film/video editor (Medical):   Marland Kitchen Lack of Transportation (Non-Medical):   Physical Activity:   . Days of Exercise per Week:   . Minutes of Exercise per Session:   Stress:   . Feeling of Stress :   Social Connections:   . Frequency of Communication with Friends and Family:   . Frequency of Social Gatherings with Friends and Family:   . Attends Religious Services:   . Active Member of Clubs or Organizations:   . Attends  Archivist Meetings:   Marland Kitchen Marital Status:   Intimate Partner Violence:   . Fear of Current or Ex-Partner:   . Emotionally Abused:   Marland Kitchen Physically Abused:   . Sexually Abused:    PHYSICAL EXAM  Vitals:   11/11/19 0751  BP: 129/73  Pulse: 68  Weight: 243 lb (110.2 kg)  Height:  5\' 9"  (1.753 m)   Body mass index is 35.88 kg/m.  Generalized: Well developed, in no acute distress   Neurological examination  Mentation: Alert oriented to time, place, history taking. Follows all commands speech and language fluent Cranial nerve II-XII: Pupils were equal round reactive to light. Extraocular movements were full, visual field were full on confrontational test. Facial sensation and strength were normal. Head turning and shoulder shrug  were normal and symmetric. Motor: The motor testing reveals 5 over 5 strength of all 4 extremities. Good symmetric motor tone is noted throughout.  Sensory: Sensory testing is intact to soft touch on all 4 extremities. No evidence of extinction is noted.  Coordination: Cerebellar testing reveals good finger-nose-finger and heel-to-shin bilaterally.  Gait and station: Gait is normal. Romberg is negative. No drift is seen.  Reflexes: Deep tendon reflexes are symmetric somewhat decreased throughout.  DIAGNOSTIC DATA (LABS, IMAGING, TESTING) - I reviewed patient records, labs, notes, testing and imaging myself where available.  Lab Results  Component Value Date   WBC 6.3 06/08/2019   HGB 16.0 06/08/2019   HCT 46.4 06/08/2019   MCV 91.7 06/08/2019   PLT 241 06/08/2019      Component Value Date/Time   NA 138 06/08/2019 1358   NA 139 11/10/2018 0935   K 4.9 06/08/2019 1358   CL 100 06/08/2019 1358   CO2 29 06/08/2019 1358   GLUCOSE 91 06/08/2019 1358   BUN 14 06/08/2019 1358   BUN 16 11/10/2018 0935   CREATININE 0.98 06/08/2019 1358   CALCIUM 9.5 06/08/2019 1358   PROT 7.1 06/08/2019 1358   PROT 6.5 11/10/2018 0935   ALBUMIN 4.1 11/10/2018  0935   AST 13 06/08/2019 1358   ALT 12 06/08/2019 1358   ALKPHOS 97 11/10/2018 0935   BILITOT 0.4 06/08/2019 1358   BILITOT 0.4 11/10/2018 0935   GFRNONAA 65 11/10/2018 0935   GFRAA 75 11/10/2018 0935   Lab Results  Component Value Date   CHOL 226 (H) 09/15/2019   HDL 37 (L) 09/15/2019   LDLCALC 150 (H) 09/15/2019   LDLDIRECT 119.0 04/30/2016   TRIG 251 (H) 09/15/2019   CHOLHDL 6.1 (H) 09/15/2019   Lab Results  Component Value Date   HGBA1C 5.2 06/08/2019   No results found for: VITAMINB12 Lab Results  Component Value Date   TSH 4.53 (H) 09/15/2019   ASSESSMENT AND PLAN 74 y.o. year old male  has a past medical history of Cervical radiculopathy, Colon polyp, Complex partial seizure disorder ([facility]) (10/08/2014), GERD (gastroesophageal reflux disease), Gout, Hypercholesterolemia, Lateral epicondylitis, Migraine, Ophthalmalgia, Orthodontics, Partial epilepsy with impairment of consciousness ([place]), Plantar fasciitis, Seizure disorder ([place]), Seizures ([place]), UTI (urinary tract infection), Vertebrobasilar insufficiency, and Vertigo. here with:  1.  Seizures  -No recurrent seizure -Continue Tegretol, brand-name, 200 mg, 2 tablets 3 times daily -Check routine blood work today -Have previously tried to help with financial assistance due to the cost of Tegretol, attempts have been unsuccessful, he had breakthrough seizure on generic Tegretol, will continue with the Tegretol for now, continues to work full-time, drives  -Call for recurrent seizure, follow-up in 1 year or sooner if needed  I spent 30 minutes of face-to-face and non-face-to-face time with patient.  This included previsit chart review, lab review, study review, order entry, electronic health record documentation, patient education.  Butler Denmark, AGNP-C, DNP 11/11/2019, 8:28 AM [facility] [address] [place], [place] [address] [phone]

## 2019-11-11 NOTE — Progress Notes (Signed)
I have read the note, and I agree with the clinical assessment and plan.  Tyreece Gelles K Nichlos Kunzler   

## 2019-11-11 NOTE — Patient Instructions (Signed)
It was great to see you today! Continue take Tegretol at current dosing Check blood work today  Call for seizure, see you back in 1 year or sooner if needed

## 2019-11-12 ENCOUNTER — Telehealth: Payer: Self-pay

## 2019-11-12 LAB — CBC WITH DIFFERENTIAL/PLATELET
Basophils Absolute: 0 10*3/uL (ref 0.0–0.2)
Basos: 1 %
EOS (ABSOLUTE): 0.2 10*3/uL (ref 0.0–0.4)
Eos: 4 %
Hematocrit: 43.5 % (ref 37.5–51.0)
Hemoglobin: 15 g/dL (ref 13.0–17.7)
Immature Grans (Abs): 0 10*3/uL (ref 0.0–0.1)
Immature Granulocytes: 0 %
Lymphocytes Absolute: 1.6 10*3/uL (ref 0.7–3.1)
Lymphs: 29 %
MCH: 32.1 pg (ref 26.6–33.0)
MCHC: 34.5 g/dL (ref 31.5–35.7)
MCV: 93 fL (ref 79–97)
Monocytes Absolute: 0.6 10*3/uL (ref 0.1–0.9)
Monocytes: 11 %
Neutrophils Absolute: 3 10*3/uL (ref 1.4–7.0)
Neutrophils: 55 %
Platelets: 199 10*3/uL (ref 150–450)
RBC: 4.67 x10E6/uL (ref 4.14–5.80)
RDW: 12.5 % (ref 11.6–15.4)
WBC: 5.4 10*3/uL (ref 3.4–10.8)

## 2019-11-12 LAB — COMPREHENSIVE METABOLIC PANEL
ALT: 13 IU/L (ref 0–44)
AST: 13 IU/L (ref 0–40)
Albumin/Globulin Ratio: 1.5 (ref 1.2–2.2)
Albumin: 3.9 g/dL (ref 3.7–4.7)
Alkaline Phosphatase: 89 IU/L (ref 48–121)
BUN/Creatinine Ratio: 23 (ref 10–24)
BUN: 24 mg/dL (ref 8–27)
Bilirubin Total: 0.3 mg/dL (ref 0.0–1.2)
CO2: 25 mmol/L (ref 20–29)
Calcium: 9 mg/dL (ref 8.6–10.2)
Chloride: 105 mmol/L (ref 96–106)
Creatinine, Ser: 1.03 mg/dL (ref 0.76–1.27)
GFR calc Af Amer: 83 mL/min/{1.73_m2} (ref >59)
GFR calc non Af Amer: 72 mL/min/{1.73_m2} (ref >59)
Globulin, Total: 2.6 g/dL (ref 1.5–4.5)
Glucose: 131 mg/dL — ABNORMAL HIGH (ref 65–99)
Potassium: 4.3 mmol/L (ref 3.5–5.2)
Sodium: 142 mmol/L (ref 134–144)
Total Protein: 6.5 g/dL (ref 6.0–8.5)

## 2019-11-12 LAB — CARBAMAZEPINE LEVEL, TOTAL: Carbamazepine (Tegretol), S: 13.3 ug/mL (ref 4.0–12.0)

## 2019-11-12 NOTE — Telephone Encounter (Signed)
Pt was notified of the message below °

## 2019-11-12 NOTE — Telephone Encounter (Signed)
-----   Message from Suzzanne Cloud, NP sent at 11/12/2019  8:36 AM EDT ----- Carbamazepine level is 13.3, verify this was random level, not trough. We can offer recheck at trough level. However, no signs of toxicity on exam. He can come by before work and take his medication right after blood draw. If he is willing, we will need to order.

## 2019-11-19 ENCOUNTER — Encounter: Payer: Self-pay | Admitting: Family Medicine

## 2019-11-19 ENCOUNTER — Other Ambulatory Visit: Payer: Self-pay

## 2019-11-19 ENCOUNTER — Ambulatory Visit (INDEPENDENT_AMBULATORY_CARE_PROVIDER_SITE_OTHER): Payer: Medicare Other | Admitting: Family Medicine

## 2019-11-19 DIAGNOSIS — M545 Low back pain, unspecified: Secondary | ICD-10-CM

## 2019-11-19 DIAGNOSIS — R739 Hyperglycemia, unspecified: Secondary | ICD-10-CM

## 2019-11-19 NOTE — Progress Notes (Signed)
Office Visit Note   Patient: Brandon Flores           Date of Birth: 1946-02-09           MRN: [id number] Visit Date: 11/19/2019 Requested by: Eunice Blase, MD [address] [place],  [place] [address] PCP: Eunice Blase, MD  Subjective: Chief Complaint  Patient presents with  . Left Shoulder - Pain  . discuss lab work    HPI: He is here with blood sugar concerns.  Recent blood glucose was 131.  Prior hemoglobin A1c was normal at 5.2 in March, but he has had several elevated blood glucose readings.  He admits to drinking Pepsi on a regular basis.  Denies any blurry vision, polyuria, polydipsia or numbness in extremities.  Regarding his prostate, he will have a biopsy in the near future.  If positive for cancer, he plans to have prostatectomy.  Also having low back pain recently.  Midline pain when he first gets up in the morning, goes away after he becomes active.  No radicular symptoms.  He wondered whether a cortisone injection might help.               ROS:   All other systems were reviewed and are negative.  Objective: Vital Signs: There were no vitals taken for this visit.  Physical Exam:  General:  Alert and oriented, in no acute distress. Pulm:  Breathing unlabored. Psy:  Normal mood, congruent affect.  Back: He has muscular tenderness in the paraspinous muscles near the L4-5 level.  Mostly to the right of midline.   Imaging: No results found.  Assessment & Plan: 1.  Hyperglycemia -A1c and basic metabolic panel today.  Emphasized the importance of quitting soft drinks. -We will monitor periodically.  2.  Muscular low back pain -Recommended against cortisone today given his troubles with blood sugar lately.  If labs look okay, we could contemplate dextrose prolotherapy.    Procedures: No procedures performed  No notes on file     PMFS History: Patient Active Problem List   Diagnosis Date Noted  . Obesity (BMI 30-39.9) 08/31/2016  . Chronic neck pain  08/31/2016  . Cervical radiculopathy 05/03/2016  . HLD (hyperlipidemia) 05/03/2016  . GERD (gastroesophageal reflux disease) 04/25/2016  . Hyperuricemia 04/25/2016  . Low back pain without sciatica 04/25/2016  . Degenerative arthritis of left knee 04/25/2016  . Metatarsalgia of both feet 04/25/2016  . Gout 04/25/2016  . Migraine headache 04/25/2016  . Nuclear sclerosis of both eyes 04/25/2016  . History of colonic polyps 04/25/2016  . VBI (vertebrobasilar insufficiency) 04/25/2016  . Vertigo 03/10/2014  . Generalized convulsive epilepsy ([place]) 11/12/2013  . Choroidal nevus, right 10/12/2013  . Vitreous floaters 10/12/2013   Past Medical History:  Diagnosis Date  . Cervical radiculopathy   . Colon polyp    (2006- adenoma and tubulovillous adenoma; 2010-adenomas; repeat 201)-Dr. Wynetta Emery  . Complex partial seizure disorder ([place]) 10/08/2014  . GERD (gastroesophageal reflux disease)   . Gout   . Hypercholesterolemia   . Lateral epicondylitis    2002  . Migraine    headaches  . Ophthalmalgia    Dr. Gershon Crane  . Orthodontics    Dr. Junius Roads  . Partial epilepsy with impairment of consciousness ([place])   . Plantar fasciitis   . Seizure disorder ([place])    Dr. Sabra Heck ([facility])  . Seizures ([place])   . UTI (urinary tract infection)   . Vertebrobasilar insufficiency   . Vertigo  Family History  Problem Relation Age of Onset  . Alcohol abuse Father   . COPD Sister   . Lung cancer Sister   . Cancer Sister   . Lung cancer Brother   . Cancer Brother   . Cancer Sister        lung  . Lung cancer Sister   . Lung cancer Brother   . Cancer Brother   . Seizures Neg Hx   . Colon cancer Neg Hx   . Esophageal cancer Neg Hx   . Rectal cancer Neg Hx   . Stomach cancer Neg Hx     Past Surgical History:  Procedure Laterality Date  . BACK SURGERY    . HEMORROIDECTOMY    . NECK SURGERY     Social History   Occupational History  . Occupation: Therapist, nutritional    Comment: [employer]  Tobacco Use  . Smoking status: Former Smoker    Packs/day: 3.00    Years: 20.00    Pack years: 60.00    Quit date: 04/02/1986    Years since quitting: 33.6  . Smokeless tobacco: Never Used  . Tobacco comment: Quit 1988, was a heavy smoker (20-30/day)  Vaping Use  . Vaping Use: Never used  Substance and Sexual Activity  . Alcohol use: No  . Drug use: No  . Sexual activity: Not on file

## 2019-11-20 ENCOUNTER — Telehealth: Payer: Self-pay | Admitting: Family Medicine

## 2019-11-20 LAB — BASIC METABOLIC PANEL
BUN: 22 mg/dL (ref 7–25)
CO2: 30 mmol/L (ref 20–32)
Calcium: 9.3 mg/dL (ref 8.6–10.3)
Chloride: 102 mmol/L (ref 98–110)
Creat: 1.05 mg/dL (ref 0.70–1.18)
Glucose, Bld: 130 mg/dL — ABNORMAL HIGH (ref 65–99)
Potassium: 4.7 mmol/L (ref 3.5–5.3)
Sodium: 140 mmol/L (ref 135–146)

## 2019-11-20 LAB — HEMOGLOBIN A1C
Hgb A1c MFr Bld: 5.2 % of total Hgb (ref <5.7)
Mean Plasma Glucose: 103 (calc)
eAG (mmol/L): 5.7 (calc)

## 2019-11-20 NOTE — Telephone Encounter (Signed)
Pt notified and stated understanding.

## 2019-11-20 NOTE — Telephone Encounter (Signed)
Glucose is still up, but technically you weren't fasting for this.  Fortunately the A1C is still good.  So you can likely keep from becoming diabetic by giving up sodas.  Recheck in 3-6 months.

## 2019-11-26 ENCOUNTER — Telehealth: Payer: Self-pay | Admitting: Neurology

## 2019-11-26 DIAGNOSIS — G40309 Generalized idiopathic epilepsy and epileptic syndromes, not intractable, without status epilepticus: Secondary | ICD-10-CM

## 2019-11-26 NOTE — Telephone Encounter (Signed)
Pt wife, Charlyne Mom would like to discuss about ordering carbamazepine (TEGRETOL) 200 MG tablet from [facility]. Ordering  carbamazepine (TEGRETOL) 200 MG tablet from different mfg is not the same. Ms. Gloriann Loan would like a call from the nurse.

## 2019-11-26 NOTE — Telephone Encounter (Signed)
Point noted, we've talked about this before during our visits. The patient is still working and driving, risk of seizures with medication change. I know we have tried PAP and tier reduction.   I can run this by Dr. Jannifer Franklin to see if he may have any ideas on this topic. Let them know, I will provide update next week, once I have a chance to talk to Dr. Jannifer Franklin.   Apparently requires brand-name Tegretol, had breakthrough seizures with generic. Has to pay $ 600 for 3 months supply, is now in the donut hole.

## 2019-11-26 NOTE — Telephone Encounter (Signed)
Called wife.  Cost of his brand name seizure medication tegretol is very costly.  Both pt and herself are both in donuthole with MCR. She is very frustrated that he pays so much out of pocket and not eligible for PAP or copay assistance because of what his income is. They both were just in 11-11-19 to see SS/NP.  Change therapy?? Cost beneficial- cannot say.  Does he truly have epilepsy?  Is he addicted to this medication?  I relayed that he started have seizures at age 74 yr.  (did he really have a seizure?) [facility] could not elicit one when he was taken off his medication).  I just let her vent.  Unfortunately we cant decide cost of medications or dictate was insurance wants to pay out.  She realized that and has spoken to insurance before about cost too.  FYI

## 2019-11-30 NOTE — Telephone Encounter (Signed)
I called pt and he was ok to try and [facility] is pharmacy.  I asked if he wanted to let his wife know about this and he stated he would.  He would need to have lab work now and then again in 3wks. For level comparison.

## 2019-11-30 NOTE — Telephone Encounter (Signed)
I talked with Dr. Jannifer Franklin about this, we can retry the generic Tegretol if the patient is willing since the cost has become so high. Tegretol is a known medication to work for him vs switching to something else  If he wants to switch- come in before switching to generic, check blood levels on brand-name, then 3 weeks later after being on generic come back and recheck levels for comparison.   If he does switch to generic, if the make/color of the pill changes indicating another manufacturer, he needs to let us know, would need levels rechecked.

## 2019-12-02 MED ORDER — CARBAMAZEPINE 200 MG PO TABS: 400.0000 mg | ORAL_TABLET | Freq: Three times a day (TID) | ORAL | 3 refills | Status: DC

## 2019-12-02 NOTE — Telephone Encounter (Signed)
I will send in generic carbamazepine, recently seen in August, blood level was 13.3.  Before switching to generic, have him come back for a trough level on brand to ensure accuracy when we compare brand versus generic. Order placed. Once on generic, check levels in 3 weeks. He needs to keep [place] updated on his timing of switching.

## 2019-12-02 NOTE — Telephone Encounter (Signed)
I called pt and relayed that generic carbamazepine was called in.  We need BN trough level prior to starting the generic .  He will come in tomorrow 8-10am for this.  He knows not to take his medication until after his lab drawn.  Wife is aware of what is going on.  He will start the generic when he finishes his BN.  After 3 wks will get lab on generic.  He verbalized understanding of plan.  Lab order in epic.

## 2019-12-02 NOTE — Addendum Note (Signed)
Addended by: Suzzanne Cloud on: 12/02/2019 01:49 PM   Modules accepted: Orders

## 2019-12-03 ENCOUNTER — Other Ambulatory Visit (INDEPENDENT_AMBULATORY_CARE_PROVIDER_SITE_OTHER): Payer: Self-pay

## 2019-12-03 ENCOUNTER — Other Ambulatory Visit: Payer: Self-pay

## 2019-12-03 DIAGNOSIS — G40309 Generalized idiopathic epilepsy and epileptic syndromes, not intractable, without status epilepticus: Secondary | ICD-10-CM | POA: Diagnosis not present

## 2019-12-03 DIAGNOSIS — Z0289 Encounter for other administrative examinations: Secondary | ICD-10-CM

## 2019-12-04 LAB — CARBAMAZEPINE LEVEL, TOTAL: Carbamazepine (Tegretol), S: 9.9 ug/mL (ref 4.0–12.0)

## 2019-12-08 ENCOUNTER — Telehealth: Payer: Self-pay | Admitting: Neurology

## 2019-12-08 DIAGNOSIS — G40309 Generalized idiopathic epilepsy and epileptic syndromes, not intractable, without status epilepticus: Secondary | ICD-10-CM

## 2019-12-08 NOTE — Telephone Encounter (Signed)
Carbamazepine level came back at 9.9 (normal range), I assume this remains on brand name drug. Once on generic he is to come back in 3 weeks, ideally he comes back at the same time this level was drawn for comparison. I asked for trough, which I think this is. I went ahead and put order in for lab draw in 3 weeks.

## 2019-12-11 DIAGNOSIS — C61 Malignant neoplasm of prostate: Secondary | ICD-10-CM | POA: Diagnosis not present

## 2019-12-11 DIAGNOSIS — R972 Elevated prostate specific antigen [PSA]: Secondary | ICD-10-CM | POA: Diagnosis not present

## 2019-12-15 ENCOUNTER — Ambulatory Visit (HOSPITAL_COMMUNITY)
Admission: EM | Admit: 2019-12-15 | Discharge: 2019-12-15 | Disposition: A | Discharge status: Home / Self Care | Payer: Medicare Other | Attending: Family Medicine | Admitting: Family Medicine

## 2019-12-15 ENCOUNTER — Other Ambulatory Visit: Payer: Self-pay

## 2019-12-15 ENCOUNTER — Encounter (HOSPITAL_COMMUNITY): Payer: Self-pay | Admitting: Emergency Medicine

## 2019-12-15 DIAGNOSIS — M1712 Unilateral primary osteoarthritis, left knee: Secondary | ICD-10-CM | POA: Diagnosis not present

## 2019-12-15 DIAGNOSIS — Z6830 Body mass index (BMI) 30.0-30.9, adult: Secondary | ICD-10-CM | POA: Insufficient documentation

## 2019-12-15 DIAGNOSIS — M545 Low back pain: Secondary | ICD-10-CM | POA: Insufficient documentation

## 2019-12-15 DIAGNOSIS — H2513 Age-related nuclear cataract, bilateral: Secondary | ICD-10-CM | POA: Insufficient documentation

## 2019-12-15 DIAGNOSIS — U071 COVID-19: Secondary | ICD-10-CM | POA: Diagnosis not present

## 2019-12-15 DIAGNOSIS — E7800 Pure hypercholesterolemia, unspecified: Secondary | ICD-10-CM | POA: Insufficient documentation

## 2019-12-15 DIAGNOSIS — M542 Cervicalgia: Secondary | ICD-10-CM | POA: Insufficient documentation

## 2019-12-15 DIAGNOSIS — E785 Hyperlipidemia, unspecified: Secondary | ICD-10-CM | POA: Diagnosis not present

## 2019-12-15 DIAGNOSIS — J069 Acute upper respiratory infection, unspecified: Secondary | ICD-10-CM | POA: Insufficient documentation

## 2019-12-15 DIAGNOSIS — M109 Gout, unspecified: Secondary | ICD-10-CM | POA: Diagnosis not present

## 2019-12-15 DIAGNOSIS — Z87891 Personal history of nicotine dependence: Secondary | ICD-10-CM | POA: Insufficient documentation

## 2019-12-15 DIAGNOSIS — E669 Obesity, unspecified: Secondary | ICD-10-CM | POA: Insufficient documentation

## 2019-12-15 DIAGNOSIS — G43909 Migraine, unspecified, not intractable, without status migrainosus: Secondary | ICD-10-CM | POA: Insufficient documentation

## 2019-12-15 DIAGNOSIS — R05 Cough: Secondary | ICD-10-CM | POA: Insufficient documentation

## 2019-12-15 LAB — SARS CORONAVIRUS 2 (TAT 6-24 HRS): SARS Coronavirus 2: POSITIVE — AB

## 2019-12-15 MED ORDER — IBUPROFEN 800 MG PO TABS: 800.0000 mg | ORAL_TABLET | Freq: Three times a day (TID) | ORAL | 0 refills | Status: DC

## 2019-12-15 MED ORDER — BENZONATATE 200 MG PO CAPS: 200.0000 mg | ORAL_CAPSULE | Freq: Three times a day (TID) | ORAL | 0 refills | Status: AC | PRN

## 2019-12-15 NOTE — ED Provider Notes (Signed)
[facility]    CSN: [id number] Arrival date & time: 12/15/19  8185      History   Chief Complaint Chief Complaint  Patient presents with  . Generalized Body Aches  . Cough    HPI Brandon Flores is a 74 y.o. male history of gout, GERD, hyperlipidemia, presenting today for evaluation of cough.  Patient reports over the past 1 to 2 days he has developed cough, body aches as well as night sweats.  He denies any known fevers.  Denies rhinorrhea or sore throat.  Concerned about possible Covid.  Is unvaccinated.  Denies any close sick contacts.  Denies GI symptoms.  Denies chest pain or shortness of breath.   HPI  Past Medical History:  Diagnosis Date  . Cervical radiculopathy   . Colon polyp    (2006- adenoma and tubulovillous adenoma; 2010-adenomas; repeat 201)-Dr. Wynetta Emery  . Complex partial seizure disorder ([place]) 10/08/2014  . GERD (gastroesophageal reflux disease)   . Gout   . Hypercholesterolemia   . Lateral epicondylitis    2002  . Migraine    headaches  . Ophthalmalgia    Dr. Gershon Crane  . Orthodontics    Dr. Junius Roads  . Partial epilepsy with impairment of consciousness ([place])   . Plantar fasciitis   . Seizure disorder ([place])    Dr. Sabra Heck ([facility])  . Seizures ([place])   . UTI (urinary tract infection)   . Vertebrobasilar insufficiency   . Vertigo     Patient Active Problem List   Diagnosis Date Noted  . Obesity (BMI 30-39.9) 08/31/2016  . Chronic neck pain 08/31/2016  . Cervical radiculopathy 05/03/2016  . HLD (hyperlipidemia) 05/03/2016  . GERD (gastroesophageal reflux disease) 04/25/2016  . Hyperuricemia 04/25/2016  . Low back pain without sciatica 04/25/2016  . Degenerative arthritis of left knee 04/25/2016  . Metatarsalgia of both feet 04/25/2016  . Gout 04/25/2016  . Migraine headache 04/25/2016  . Nuclear sclerosis of both eyes 04/25/2016  . History of colonic polyps 04/25/2016  . VBI (vertebrobasilar insufficiency) 04/25/2016  . Vertigo 03/10/2014  .  Generalized convulsive epilepsy ([place]) 11/12/2013  . Choroidal nevus, right 10/12/2013  . Vitreous floaters 10/12/2013    Past Surgical History:  Procedure Laterality Date  . BACK SURGERY    . HEMORROIDECTOMY    . NECK SURGERY         Home Medications    Prior to Admission medications   Medication Sig Start Date End Date Taking? Authorizing Provider  carbamazepine (TEGRETOL) 200 MG tablet Take 2 tablets (400 mg total) by mouth 3 (three) times daily. WILL TRY SWITCH TO GENERIC 12/02/19  Yes Suzzanne Cloud, NP  allopurinol (ZYLOPRIM) 100 MG tablet Take 1 tablet (100 mg total) by mouth daily. 02/13/19   Hilts, Legrand Como, MD  benzonatate (TESSALON) 200 MG capsule Take 1 capsule (200 mg total) by mouth 3 (three) times daily as needed for up to 7 days for cough. 12/15/19 12/22/19  Bayleigh Loflin C, PA-C  dutasteride (AVODART) 0.5 MG capsule Take 1 capsule (0.5 mg total) by mouth every evening. 12/15/18   Hilts, Legrand Como, MD  HYDROcodone-acetaminophen (NORCO/VICODIN) 5-325 MG tablet Take 1 tablet by mouth every 6 (six) hours as needed for moderate pain. 09/15/19   Hilts, Legrand Como, MD  ibuprofen (ADVIL) 800 MG tablet Take 1 tablet (800 mg total) by mouth 3 (three) times daily. 12/15/19   Jesslyn Viglione C, PA-C  levothyroxine (SYNTHROID) 50 MCG tablet Take 1 tablet (50 mcg total) by mouth daily  before breakfast. 06/23/19   Hilts, Legrand Como, MD  meclizine (ANTIVERT) 25 MG tablet Take 1 tablet (25 mg total) by mouth 3 (three) times daily as needed for dizziness. 06/08/19   Hilts, Legrand Como, MD  meloxicam (MOBIC) 15 MG tablet Take 15 mg by mouth daily. 06/19/19   [provider]  OVER THE COUNTER MEDICATION Stool softener with probiotic twice a week.    [provider]  tamsulosin (FLOMAX) 0.4 MG CAPS capsule Take 1 capsule (0.4 mg total) by mouth daily after supper. 05/30/18   Hilts, Legrand Como, MD    Family History Family History  Problem Relation Age of Onset  . Alcohol abuse Father   . COPD  Sister   . Lung cancer Sister   . Cancer Sister   . Lung cancer Brother   . Cancer Brother   . Cancer Sister        lung  . Lung cancer Sister   . Lung cancer Brother   . Cancer Brother   . Seizures Neg Hx   . Colon cancer Neg Hx   . Esophageal cancer Neg Hx   . Rectal cancer Neg Hx   . Stomach cancer Neg Hx     Social History Social History   Tobacco Use  . Smoking status: Former Smoker    Packs/day: 3.00    Years: 20.00    Pack years: 60.00    Quit date: 04/02/1986    Years since quitting: 33.7  . Smokeless tobacco: Never Used  . Tobacco comment: Quit 1988, was a heavy smoker (20-30/day)  Vaping Use  . Vaping Use: Never used  Substance Use Topics  . Alcohol use: No  . Drug use: No     Allergies   Patient has no known allergies.   Review of Systems Review of Systems  Constitutional: Positive for chills. Negative for activity change, appetite change, fatigue and fever.  HENT: Negative for congestion, ear pain, rhinorrhea, sinus pressure, sore throat and trouble swallowing.   Eyes: Negative for discharge and redness.  Respiratory: Positive for cough. Negative for chest tightness and shortness of breath.   Cardiovascular: Negative for chest pain.  Gastrointestinal: Negative for abdominal pain, diarrhea, nausea and vomiting.  Musculoskeletal: Positive for myalgias.  Skin: Negative for rash.  Neurological: Negative for dizziness, light-headedness and headaches.     Physical Exam Triage Vital Signs ED Triage Vitals  Enc Vitals Group     BP 12/15/19 1040 (!) 153/83     Pulse Rate 12/15/19 1040 74     Resp 12/15/19 1040 18     Temp 12/15/19 1040 98.5 F (36.9 C)     Temp Source 12/15/19 1040 Oral     SpO2 12/15/19 1040 99 %     Weight --      Height --      Head Circumference --      Peak Flow --      Pain Score 12/15/19 1039 9     Pain Loc --      Pain Edu? --      Excl. in [place]? --    No data found.  Updated Vital Signs BP (!) 153/83 (BP Location:  Left Arm)   Pulse 74   Temp 98.5 F (36.9 C) (Oral)   Resp 18   SpO2 99%   Visual Acuity Right Eye Distance:   Left Eye Distance:   Bilateral Distance:    Right Eye Near:   Left Eye Near:    Bilateral  Near:     Physical Exam Vitals and nursing note reviewed.  Constitutional:      Appearance: He is well-developed.     Comments: No acute distress  HENT:     Head: Normocephalic and atraumatic.     Ears:     Comments: Bilateral ears without tenderness to palpation of external auricle, tragus and mastoid, EAC's without erythema or swelling, TM's with good bony landmarks and cone of light. Non erythematous.     Nose: Nose normal.     Mouth/Throat:     Comments: Oral mucosa pink and moist, no tonsillar enlargement or exudate. Posterior pharynx patent and nonerythematous, no uvula deviation or swelling. Normal phonation. Eyes:     Conjunctiva/sclera: Conjunctivae normal.  Cardiovascular:     Rate and Rhythm: Normal rate.  Pulmonary:     Effort: Pulmonary effort is normal. No respiratory distress.     Comments: Breathing comfortably at rest, CTABL, no wheezing, rales or other adventitious sounds auscultated Abdominal:     General: There is no distension.  Musculoskeletal:        General: Normal range of motion.     Cervical back: Neck supple.  Skin:    General: Skin is warm and dry.  Neurological:     Mental Status: He is alert and oriented to person, place, and time.      UC Treatments / Results  Labs (all labs ordered are listed, but only abnormal results are displayed) Labs Reviewed  SARS CORONAVIRUS 2 (TAT 6-24 HRS)    EKG   Radiology No results found.  Procedures Procedures (including critical care time)  Medications Ordered in UC Medications - No data to display  Initial Impression / Assessment and Plan / UC Course  I have reviewed the triage vital signs and the nursing notes.  Pertinent labs & imaging results that were available during my care of  the patient were reviewed by me and considered in my medical decision making (see chart for details).     Covid test pending.  Suspect likely viral URI, possible Covid.  Recommend symptomatic and supportive care.  Lungs clear.  Vital signs stable.  Discussed strict return precautions. Patient verbalized understanding and is agreeable with plan.  Final Clinical Impressions(s) / UC Diagnoses   Final diagnoses:  Viral URI with cough     Discharge Instructions     COVID test pending Rest and fluids Ibuprofen and tylenol for aches, headache, any fevers Tessalon/benzonatate as needed for cough    ED Prescriptions    Medication Sig Dispense Auth. Provider   benzonatate (TESSALON) 200 MG capsule Take 1 capsule (200 mg total) by mouth 3 (three) times daily as needed for up to 7 days for cough. 28 capsule Davide Risdon C, PA-C   ibuprofen (ADVIL) 800 MG tablet Take 1 tablet (800 mg total) by mouth 3 (three) times daily. 21 tablet Larinda Herter, [place] C, PA-C     PDMP not reviewed this encounter.   Janith Lima, PA-C 12/15/19 1126

## 2019-12-15 NOTE — ED Triage Notes (Signed)
Pt presents with muscle aches, coughing on and off xs 2 days.  Denies runny nose, fever, n,v,d, chest pain, sob, loss of taste or smell.    States has been taking tylenol every 5 hours.

## 2019-12-15 NOTE — Discharge Instructions (Signed)
COVID test pending Rest and fluids Ibuprofen and tylenol for aches, headache, any fevers Tessalon/benzonatate as needed for cough

## 2019-12-16 ENCOUNTER — Other Ambulatory Visit (HOSPITAL_COMMUNITY): Payer: Self-pay | Admitting: Family

## 2019-12-16 DIAGNOSIS — U071 COVID-19: Secondary | ICD-10-CM

## 2019-12-16 NOTE — Progress Notes (Signed)
I connected by phone with Brandon Flores on 12/16/2019 at 7:55 PM to discuss Brandon potential use of a new treatment for mild to moderate COVID-19 viral infection in non-hospitalized patients.  This Flores is a 74 y.o. male that meets Brandon FDA criteria for Emergency Use Authorization of COVID monoclonal antibody casirivimab/imdevimab.  Has a (+) direct SARS-CoV-2 viral test result  Has mild or moderate COVID-19   Is NOT hospitalized due to COVID-19  Is within 10 days of symptom onset  Has at least one of Brandon high risk factor(s) for progression to severe COVID-19 and/or hospitalization as defined in EUA.  Specific high risk criteria : Older age (>/= 73 yo)   Symptoms of cough, aches, and night sweats began 9/12.    I have spoken and communicated Brandon following to Brandon Flores or parent/caregiver regarding COVID monoclonal antibody treatment:  1. FDA has authorized Brandon emergency use for Brandon treatment of mild to moderate COVID-19 in adults and pediatric patients with positive results of direct SARS-CoV-2 viral testing who are 24 years of age and older weighing at least 40 kg, and who are at high risk for progressing to severe COVID-19 and/or hospitalization.  2. Brandon significant known and potential risks and benefits of COVID monoclonal antibody, and Brandon extent to which such potential risks and benefits are unknown.  3. Information on available alternative treatments and Brandon risks and benefits of those alternatives, including clinical trials.  4. Patients treated with COVID monoclonal antibody should continue to self-isolate and use infection control measures (e.g., wear mask, isolate, social distance, avoid sharing personal items, clean and disinfect "high touch" surfaces, and frequent handwashing) according to CDC guidelines.   5. Brandon Flores or parent/caregiver has Brandon option to accept or refuse COVID monoclonal antibody treatment.  After reviewing this information with Brandon Flores, Brandon Flores  agreed to proceed with receiving casirivimab\imdevimab infusion and will be provided a copy of Brandon Fact sheet prior to receiving Brandon infusion. Brandon Flores 12/16/2019 7:55 PM

## 2019-12-17 ENCOUNTER — Ambulatory Visit (HOSPITAL_COMMUNITY)
Admission: RE | Admit: 2019-12-17 | Discharge: 2019-12-17 | Disposition: A | Discharge status: Home / Self Care | Payer: Medicare Other | Source: Ambulatory Visit | Attending: Pulmonary Disease | Admitting: Pulmonary Disease

## 2019-12-17 DIAGNOSIS — U071 COVID-19: Secondary | ICD-10-CM | POA: Diagnosis not present

## 2019-12-17 DIAGNOSIS — Z23 Encounter for immunization: Secondary | ICD-10-CM | POA: Diagnosis not present

## 2019-12-17 MED ORDER — DIPHENHYDRAMINE HCL 50 MG/ML IJ SOLN: 50.0000 mg | Freq: Once | INTRAMUSCULAR | Status: DC | PRN

## 2019-12-17 MED ORDER — FAMOTIDINE IN NACL 20-0.9 MG/50ML-% IV SOLN: 20.0000 mg | Freq: Once | INTRAVENOUS | Status: DC | PRN

## 2019-12-17 MED ORDER — METHYLPREDNISOLONE SODIUM SUCC 125 MG IJ SOLR: 125.0000 mg | Freq: Once | INTRAMUSCULAR | Status: DC | PRN

## 2019-12-17 MED ORDER — SODIUM CHLORIDE 0.9 % IV SOLN
1200.0000 mg | Freq: Once | INTRAVENOUS | Status: AC
  Administered 2019-12-17: 1200 mg via INTRAVENOUS

## 2019-12-17 MED ORDER — SODIUM CHLORIDE 0.9 % IV SOLN: INTRAVENOUS | Status: DC | PRN

## 2019-12-17 MED ORDER — ALBUTEROL SULFATE HFA 108 (90 BASE) MCG/ACT IN AERS: 2.0000 | INHALATION_SPRAY | Freq: Once | RESPIRATORY_TRACT | Status: DC | PRN

## 2019-12-17 MED ORDER — EPINEPHRINE 0.3 MG/0.3ML IJ SOAJ: 0.3000 mg | Freq: Once | INTRAMUSCULAR | Status: DC | PRN

## 2019-12-17 NOTE — Discharge Instructions (Signed)

## 2019-12-17 NOTE — Progress Notes (Signed)
  Diagnosis: COVID-19  Physician: DR. Joya Gaskins  Procedure: [facility] Infusion [facility] Med: casirivimab\imdevimab infusion - Provided patient with casirivimab\imdevimab fact sheet for patients, parents and caregivers prior to infusion.  Complications: No immediate complications noted.  Discharge: Discharged home   Ludwig Lean 12/17/2019

## 2019-12-28 DIAGNOSIS — C61 Malignant neoplasm of prostate: Secondary | ICD-10-CM | POA: Diagnosis not present

## 2020-01-13 DIAGNOSIS — C61 Malignant neoplasm of prostate: Secondary | ICD-10-CM | POA: Diagnosis not present

## 2020-02-01 ENCOUNTER — Encounter: Payer: Self-pay | Admitting: Radiation Oncology

## 2020-02-01 NOTE — Progress Notes (Signed)
GU Location of Tumor / Histology: prostatic adenocarcinoma  If Prostate Cancer, Gleason Score is (4 + 3) and PSA is (5.5). Prostate volume: 60  Biopsies of prostate (if applicable) revealed:    Past/Anticipated interventions by urology, if any: prostate biopsy, referral to discuss radiation therapy options  Past/Anticipated interventions by medical oncology, if any: no  Weight changes, if any: no  Bowel/Bladder complaints, if any: IPSS 12. SHIM 6. Denies dysuria or hematuria. Reports nocturia. Reports urinary leakage associated with urgency. Not sexually active. Reports taking tamsulosin.  Nausea/Vomiting, if any: no  Pain issues, if any:  denies  SAFETY ISSUES:  Prior radiation? denies  Pacemaker/ICD? denies  Possible current pregnancy? no, male patient  Is the patient on methotrexate? denies  Current Complaints / other details:  74 year old male. Married with one daughter and one son. Works as a Therapist, music.

## 2020-02-02 ENCOUNTER — Encounter: Payer: Self-pay | Admitting: Radiation Oncology

## 2020-02-02 ENCOUNTER — Ambulatory Visit
Admission: RE | Admit: 2020-02-02 | Discharge: 2020-02-02 | Disposition: A | Discharge status: Home / Self Care | Payer: Medicare Other | Source: Ambulatory Visit | Attending: Radiation Oncology | Admitting: Radiation Oncology

## 2020-02-02 ENCOUNTER — Other Ambulatory Visit: Payer: Self-pay

## 2020-02-02 VITALS — BP 133/77 | HR 77 | Temp 97.8°F | Resp 18 | Ht 69.0 in | Wt 237.0 lb

## 2020-02-02 DIAGNOSIS — C61 Malignant neoplasm of prostate: Secondary | ICD-10-CM

## 2020-02-02 DIAGNOSIS — R972 Elevated prostate specific antigen [PSA]: Secondary | ICD-10-CM | POA: Diagnosis not present

## 2020-02-02 DIAGNOSIS — Z801 Family history of malignant neoplasm of trachea, bronchus and lung: Secondary | ICD-10-CM | POA: Diagnosis not present

## 2020-02-02 HISTORY — DX: Malignant neoplasm of prostate: C61

## 2020-02-02 NOTE — Progress Notes (Signed)
Radiation Oncology         (336) [phone] ________________________________  Initial outpatient Consultation  Name: AVRAJ LINDROTH MRN: [id number]  Date: 02/02/2020  DOB: 09-04-1945  HY:IFOYD, Legrand Como, MD  Ardis Hughs, MD   REFERRING PHYSICIAN: Ardis Hughs, MD  DIAGNOSIS: 74 y.o. gentleman with Stage T2b adenocarcinoma of the prostate with Gleason score of 4+3, and PSA of 5.5.    ICD-10-CM   1. Malignant neoplasm of prostate ([place])  C61     HISTORY OF PRESENT ILLNESS: LEEAM CEDRONE is a 74 y.o. male with a diagnosis of prostate cancer. He was initially referred for evaluation in urology by Dr. Claudia Desanctis on 10/30/2019 with lower urinary tract symptoms, as well as erectile dysfunction and an elevated PSA of 5.5.  Digital rectal examination was performed at that time revealing a 5 mm midline mid gland nodule.  The patient proceeded to transrectal ultrasound with 12 biopsies of the prostate on 12/11/2019.  The prostate volume measured 60.61 cc.  Out of 12 core biopsies, 5 were positive, all on the right side.  The maximum Gleason score was 4+3, and this was seen in the right mid. Additionally, Gleason 3+4 was seen in the right mid lateral, base lateral, and base, and a small focus of Gleason 3+3 in the right apex lateral.  He met with Dr. Louis Meckel on 01/13/2020 to discuss prostatectomy, which is the direction he was leaning towards but Dr. Louis Meckel felt that radiation might be the better option given his age and active lifestyle. After doing further reading and research himself, he also agrees that radiation seems to be a better fit.  The patient reviewed the biopsy results with his urologist and he has kindly been referred today for discussion of potential radiation treatment options.  PREVIOUS RADIATION THERAPY: No  PAST MEDICAL HISTORY:  Past Medical History:  Diagnosis Date  . Cervical radiculopathy   . Colon polyp    (2006- adenoma and tubulovillous adenoma; 2010-adenomas; repeat  201)-Dr. Wynetta Emery  . Complex partial seizure disorder ([place]) 10/08/2014  . GERD (gastroesophageal reflux disease)   . Gout   . Hypercholesterolemia   . Lateral epicondylitis    2002  . Migraine    headaches  . Ophthalmalgia    Dr. Gershon Crane  . Orthodontics    Dr. Junius Roads  . Partial epilepsy with impairment of consciousness ([place])   . Plantar fasciitis   . Prostate cancer ([place])   . Seizure disorder ([place])    Dr. Sabra Heck ([facility])  . Seizures ([place])   . UTI (urinary tract infection)   . Vertebrobasilar insufficiency   . Vertigo       PAST SURGICAL HISTORY: Past Surgical History:  Procedure Laterality Date  . BACK SURGERY    . HEMORROIDECTOMY    . NECK SURGERY    . PROSTATE BIOPSY      FAMILY HISTORY:  Family History  Problem Relation Age of Onset  . Alcohol abuse Father   . COPD Sister   . Lung cancer Sister   . Cancer Sister   . Lung cancer Brother   . Cancer Brother   . Cancer Sister        lung  . Lung cancer Sister   . Lung cancer Brother   . Cancer Brother   . Seizures Neg Hx   . Colon cancer Neg Hx   . Esophageal cancer Neg Hx   . Rectal cancer Neg Hx   . Stomach cancer Neg Hx   .  Breast cancer Neg Hx   . Prostate cancer Neg Hx   . Pancreatic cancer Neg Hx     SOCIAL HISTORY:  Social History   Socioeconomic History  . Marital status: Married    Spouse name: Kizzie Furnish  . Number of children: 2  . Years of education: 44  . Highest education level: Not on file  Occupational History  . Occupation: Therapist, nutritional    Comment: [employer]  Tobacco Use  . Smoking status: Former Smoker    Packs/day: 3.00    Years: 20.00    Pack years: 60.00    Quit date: 04/02/1986    Years since quitting: 33.8  . Smokeless tobacco: Never Used  . Tobacco comment: Quit 1988, was a heavy smoker (20-30/day)  Vaping Use  . Vaping Use: Never used  Substance and Sexual Activity  . Alcohol use: No  . Drug use: No  . Sexual activity: Not on file  Other  Topics Concern  . Not on file  Social History Narrative   Occasional glass of caffeine   Patient is right handed.   Lives at home with his wife   They have 6 children together and 15 grandchildren   Social Determinants of Health   Financial Resource Strain:   . Difficulty of Paying Living Expenses: Not on file  Food Insecurity:   . Worried About Charity fundraiser in the Last Year: Not on file  . Ran Out of Food in the Last Year: Not on file  Transportation Needs:   . Lack of Transportation (Medical): Not on file  . Lack of Transportation (Non-Medical): Not on file  Physical Activity:   . Days of Exercise per Week: Not on file  . Minutes of Exercise per Session: Not on file  Stress:   . Feeling of Stress : Not on file  Social Connections:   . Frequency of Communication with Friends and Family: Not on file  . Frequency of Social Gatherings with Friends and Family: Not on file  . Attends Religious Services: Not on file  . Active Member of Clubs or Organizations: Not on file  . Attends Archivist Meetings: Not on file  . Marital Status: Not on file  Intimate Partner Violence:   . Fear of Current or Ex-Partner: Not on file  . Emotionally Abused: Not on file  . Physically Abused: Not on file  . Sexually Abused: Not on file    ALLERGIES: Patient has no known allergies.  MEDICATIONS:  Current Outpatient Medications  Medication Sig Dispense Refill  . allopurinol (ZYLOPRIM) 100 MG tablet Take 1 tablet (100 mg total) by mouth daily. 90 tablet 3  . carbamazepine (TEGRETOL) 200 MG tablet Take 2 tablets (400 mg total) by mouth 3 (three) times daily. WILL TRY SWITCH TO GENERIC 540 tablet 3  . dutasteride (AVODART) 0.5 MG capsule Take 1 capsule (0.5 mg total) by mouth every evening. 30 capsule 6  . HYDROcodone-acetaminophen (NORCO/VICODIN) 5-325 MG tablet Take 1 tablet by mouth every 6 (six) hours as needed for moderate pain. 30 tablet 0  . ibuprofen (ADVIL) 800 MG tablet  Take 1 tablet (800 mg total) by mouth 3 (three) times daily. 21 tablet 0  . levothyroxine (SYNTHROID) 50 MCG tablet Take 1 tablet (50 mcg total) by mouth daily before breakfast. 30 tablet 3  . meclizine (ANTIVERT) 25 MG tablet Take 1 tablet (25 mg total) by mouth 3 (three) times daily as needed for dizziness. 30 tablet  6  . meloxicam (MOBIC) 15 MG tablet Take 15 mg by mouth daily.    Marland Kitchen OVER THE COUNTER MEDICATION Stool softener with probiotic twice a week.    . tamsulosin (FLOMAX) 0.4 MG CAPS capsule Take 1 capsule (0.4 mg total) by mouth daily after supper. 30 capsule 6   No current facility-administered medications for this encounter.    REVIEW OF SYSTEMS:  On review of systems, the patient reports that he is doing well overall. He denies any chest pain, shortness of breath, cough, fevers, chills, night sweats, unintended weight changes. He denies any bowel disturbances, and denies abdominal pain, nausea or vomiting. He denies any new musculoskeletal or joint aches or pains. His IPSS was 12, indicating moderate urinary symptoms with increased frequency, urgency with occasional small volume UUI if he postpones voiding and nocturia x2 despite taking Flomax. His SHIM was 6, indicating he has severe erectile dysfunction but he reports that he is not currently sexually active. A complete review of systems is obtained and is otherwise negative.    PHYSICAL EXAM:  Wt Readings from Last 3 Encounters:  11/11/19 243 lb (110.2 kg)  06/08/19 249 lb 3.2 oz (113 kg)  11/10/18 242 lb (109.8 kg)   Temp Readings from Last 3 Encounters:  12/17/19 98 F (36.7 C) (Oral)  12/15/19 98.5 F (36.9 C) (Oral)  11/10/18 (!) 97.5 F (36.4 C)   BP Readings from Last 3 Encounters:  12/17/19 (!) 145/90  12/15/19 (!) 153/83  11/11/19 129/73   Pulse Readings from Last 3 Encounters:  12/17/19 73  12/15/19 74  11/11/19 68    /10  In general this is a well appearing Caucasian male in no acute distress. He is  alert and oriented x4 and appropriate throughout the examination. HEENT reveals that the patient is normocephalic, atraumatic. EOMs are intact. PERRLA. Skin is intact without any evidence of gross lesions. Cardiopulmonary assessment is negative for acute distress and he exhibits normal effort. The abdomen is soft, non tender, non distended. Lower extremities are negative for pretibial pitting edema, deep calf tenderness, cyanosis or clubbing.   KPS = 100  100 - Normal; no complaints; no evidence of disease. 90   - Able to carry on normal activity; minor signs or symptoms of disease. 80   - Normal activity with effort; some signs or symptoms of disease. 64   - Cares for self; unable to carry on normal activity or to do active work. 60   - Requires occasional assistance, but is able to care for most of his personal needs. 50   - Requires considerable assistance and frequent medical care. 20   - Disabled; requires special care and assistance. 32   - Severely disabled; hospital admission is indicated although death not imminent. 41   - Very sick; hospital admission necessary; active supportive treatment necessary. 10   - Moribund; fatal processes progressing rapidly. 0     - Dead  Karnofsky DA, Abelmann [place], Craver LS and Burchenal [facility] [phone]) The use of the nitrogen mustards in the palliative treatment of carcinoma: with particular reference to bronchogenic carcinoma Cancer 1 634-56  LABORATORY DATA:  Lab Results  Component Value Date   WBC 5.4 11/11/2019   HGB 15.0 11/11/2019   HCT 43.5 11/11/2019   MCV 93 11/11/2019   PLT 199 11/11/2019   Lab Results  Component Value Date   NA 140 11/19/2019   K 4.7 11/19/2019   CL 102 11/19/2019   CO2 30 11/19/2019  Lab Results  Component Value Date   ALT 13 11/11/2019   AST 13 11/11/2019   ALKPHOS 89 11/11/2019   BILITOT 0.3 11/11/2019     RADIOGRAPHY: No results found.    IMPRESSION/PLAN: 1. 74 y.o. gentleman with Stage T1a  adenocarcinoma of the prostate with Gleason Score of 4+3, and PSA of 5.5. We discussed the patient's workup and outlined the nature of prostate cancer in this setting. The patient's T stage, Gleason's score, and PSA put him into the unfavorable intermediate risk group. Accordingly, he is eligible for a variety of potential treatment options including brachytherapy, 5.5 weeks of external radiation, or prostatectomy. We discussed the available radiation techniques, and focused on the details and logistics of delivery. We discussed and outlined the risks, benefits, short and long-term effects associated with radiotherapy and compared and contrasted these with prostatectomy. We discussed the role of SpaceOAR gel in reducing the rectal toxicity associated with radiotherapy.  He and his wife were encouraged to ask questions that were answered to their stated satisfaction.  At the conclusion of our conversation, the patient is interested in moving forward with 5.5 weeks of external beam therapy. We will share our discussion with Dr. Claudia Desanctis and make arrangements for fiducial markers and SpaceOAR gel placement, prior to simulation, to reduce rectal toxicity from radiotherapy. The patient appears to have a good understanding of his disease and our treatment recommendations which are of curative intent and is in agreement with the stated plan. Therefore, we will move forward with treatment planning accordingly, in anticipation of beginning IMRT in the near future.   Nicholos Johns, PA-C    Tyler Pita, MD  [facility] Direct Dial: [phone]  Fax: [phone] [email or web address]  Skype  LinkedIn   This document serves as a record of services personally performed by Tyler Pita, MD and Freeman Caldron, PA-C. It was created on their behalf by Wilburn Mylar, a trained medical scribe. The creation of this record is based on the scribe's personal observations and the provider's  statements to them. This document has been checked and approved by the attending provider.

## 2020-02-03 DIAGNOSIS — C61 Malignant neoplasm of prostate: Secondary | ICD-10-CM | POA: Insufficient documentation

## 2020-02-04 ENCOUNTER — Encounter: Payer: Self-pay | Admitting: Medical Oncology

## 2020-02-04 ENCOUNTER — Encounter: Payer: Self-pay | Admitting: Licensed Clinical Social Worker

## 2020-02-04 NOTE — Progress Notes (Signed)
[facility] Psychosocial Distress Screening Clinical Social Work  Clinical Social Work was referred by distress screening protocol.  The patient scored a 6 on the Psychosocial Distress Thermometer which indicates moderate distress. Clinical Social Worker attempted to contact patient by phone to assess for distress and other psychosocial needs.  No answer. Left VM with direct contact information.  ONCBCN DISTRESS SCREENING 02/02/2020  Screening Type Initial Screening  Distress experienced in past week (1-10) 6  Practical problem type Work/school  Information Concerns Type Lack of info about diagnosis;Lack of info about treatment;Lack of info about complementary therapy choices  Physician notified of physical symptoms Yes  Referral to clinical psychology No  Referral to clinical social work No  Referral to dietition No  Referral to financial advocate No  Referral to support programs Yes  Referral to palliative care No  Other may reach via cell phone at [place], LCSW

## 2020-02-10 ENCOUNTER — Telehealth: Payer: Self-pay | Admitting: *Deleted

## 2020-02-10 NOTE — Telephone Encounter (Signed)
Called patient to inform of appt. for fid. Markers and space oar to be placed on 02-16-20 @ 11:45 am @ [facility] and his sim appt. With Dr. Tammi Klippel on 02-23-20 - arrival time- 10:15 am, spoke with patient and he is aware of these appts.

## 2020-02-12 ENCOUNTER — Other Ambulatory Visit: Payer: Self-pay | Admitting: Urology

## 2020-02-12 ENCOUNTER — Telehealth: Payer: Self-pay | Admitting: *Deleted

## 2020-02-12 DIAGNOSIS — C61 Malignant neoplasm of prostate: Secondary | ICD-10-CM

## 2020-02-12 NOTE — Telephone Encounter (Signed)
RETURNED PATIENT'S PHONE CALL, SPOKE WITH PATIENT. ?

## 2020-02-16 DIAGNOSIS — C61 Malignant neoplasm of prostate: Secondary | ICD-10-CM | POA: Diagnosis not present

## 2020-02-22 ENCOUNTER — Telehealth: Payer: Self-pay | Admitting: *Deleted

## 2020-02-22 NOTE — Telephone Encounter (Signed)
CALLED PATIENT TO REMIND OF SIM AND MRI FOR 02-23-20, LVM FOR A RETURN CALL

## 2020-02-23 ENCOUNTER — Encounter: Payer: Self-pay | Admitting: Medical Oncology

## 2020-02-23 ENCOUNTER — Ambulatory Visit
Admission: RE | Admit: 2020-02-23 | Discharge: 2020-02-23 | Disposition: A | Discharge status: Home / Self Care | Payer: Medicare Other | Source: Ambulatory Visit | Attending: Radiation Oncology | Admitting: Radiation Oncology

## 2020-02-23 ENCOUNTER — Ambulatory Visit (HOSPITAL_COMMUNITY)
Admission: RE | Admit: 2020-02-23 | Discharge: 2020-02-23 | Disposition: A | Discharge status: Home / Self Care | Payer: Medicare Other | Source: Ambulatory Visit | Attending: Urology | Admitting: Urology

## 2020-02-23 ENCOUNTER — Other Ambulatory Visit: Payer: Self-pay

## 2020-02-23 DIAGNOSIS — C61 Malignant neoplasm of prostate: Secondary | ICD-10-CM | POA: Insufficient documentation

## 2020-02-23 NOTE — Progress Notes (Signed)
  Radiation Oncology         [phone] ________________________________  Name: Brandon Flores MRN: [id number]  Date: 02/23/2020  DOB: [birth year]  SIMULATION AND TREATMENT PLANNING NOTE    ICD-10-CM   1. Malignant neoplasm of prostate ([place])  C61     DIAGNOSIS:  74 y.o. gentleman with Stage T2b adenocarcinoma of the prostate with Gleason score of 4+3, and PSA of 5.5  NARRATIVE:  The patient was brought to the [facility].  Identity was confirmed.  All relevant records and images related to the planned course of therapy were reviewed.  The patient freely provided informed written consent to proceed with treatment after reviewing the details related to the planned course of therapy. The consent form was witnessed and verified by the simulation staff.  Then, the patient was set-up in a stable reproducible supine position for radiation therapy.  A vacuum lock pillow device was custom fabricated to position his legs in a reproducible immobilized position.  Then, I performed a urethrogram under sterile conditions to identify the prostatic apex.  CT images were obtained.  Surface markings were placed.  The CT images were loaded into the planning software.  Then the prostate target and avoidance structures including the rectum, bladder, bowel and hips were contoured.  Treatment planning then occurred.  The radiation prescription was entered and confirmed.  A total of one complex treatment devices was fabricated. I have requested : Intensity Modulated Radiotherapy (IMRT) is medically necessary for this case for the following reason:  Rectal sparing.Marland Kitchen  PLAN:  The patient will receive 70 Gy in 28 fractions.  ________________________________  Sheral Apley Tammi Klippel, M.D.

## 2020-02-29 DIAGNOSIS — C61 Malignant neoplasm of prostate: Secondary | ICD-10-CM | POA: Diagnosis not present

## 2020-03-07 ENCOUNTER — Encounter: Payer: Self-pay | Admitting: Medical Oncology

## 2020-03-07 ENCOUNTER — Ambulatory Visit
Admission: RE | Admit: 2020-03-07 | Discharge: 2020-03-07 | Disposition: A | Discharge status: Home / Self Care | Payer: Medicare Other | Source: Ambulatory Visit | Attending: Radiation Oncology | Admitting: Radiation Oncology

## 2020-03-07 ENCOUNTER — Other Ambulatory Visit: Payer: Self-pay

## 2020-03-07 DIAGNOSIS — C61 Malignant neoplasm of prostate: Secondary | ICD-10-CM | POA: Diagnosis not present

## 2020-03-08 ENCOUNTER — Ambulatory Visit
Admission: RE | Admit: 2020-03-08 | Discharge: 2020-03-08 | Disposition: A | Discharge status: Home / Self Care | Payer: Medicare Other | Source: Ambulatory Visit | Attending: Radiation Oncology | Admitting: Radiation Oncology

## 2020-03-08 DIAGNOSIS — C61 Malignant neoplasm of prostate: Secondary | ICD-10-CM | POA: Diagnosis not present

## 2020-03-09 ENCOUNTER — Ambulatory Visit
Admission: RE | Admit: 2020-03-09 | Discharge: 2020-03-09 | Disposition: A | Discharge status: Home / Self Care | Payer: Medicare Other | Source: Ambulatory Visit | Attending: Radiation Oncology | Admitting: Radiation Oncology

## 2020-03-09 ENCOUNTER — Other Ambulatory Visit: Payer: Self-pay

## 2020-03-09 DIAGNOSIS — C61 Malignant neoplasm of prostate: Secondary | ICD-10-CM | POA: Diagnosis not present

## 2020-03-10 ENCOUNTER — Ambulatory Visit
Admission: RE | Admit: 2020-03-10 | Discharge: 2020-03-10 | Disposition: A | Discharge status: Home / Self Care | Payer: Medicare Other | Source: Ambulatory Visit | Attending: Radiation Oncology | Admitting: Radiation Oncology

## 2020-03-10 DIAGNOSIS — C61 Malignant neoplasm of prostate: Secondary | ICD-10-CM | POA: Diagnosis not present

## 2020-03-11 ENCOUNTER — Ambulatory Visit
Admission: RE | Admit: 2020-03-11 | Discharge: 2020-03-11 | Disposition: A | Discharge status: Home / Self Care | Payer: Medicare Other | Source: Ambulatory Visit | Attending: Radiation Oncology | Admitting: Radiation Oncology

## 2020-03-11 DIAGNOSIS — C61 Malignant neoplasm of prostate: Secondary | ICD-10-CM | POA: Diagnosis not present

## 2020-03-14 ENCOUNTER — Ambulatory Visit
Admission: RE | Admit: 2020-03-14 | Discharge: 2020-03-14 | Disposition: A | Discharge status: Home / Self Care | Payer: Medicare Other | Source: Ambulatory Visit | Attending: Radiation Oncology | Admitting: Radiation Oncology

## 2020-03-14 DIAGNOSIS — C61 Malignant neoplasm of prostate: Secondary | ICD-10-CM | POA: Diagnosis not present

## 2020-03-15 ENCOUNTER — Ambulatory Visit
Admission: RE | Admit: 2020-03-15 | Discharge: 2020-03-15 | Disposition: A | Discharge status: Home / Self Care | Payer: Medicare Other | Source: Ambulatory Visit | Attending: Radiation Oncology | Admitting: Radiation Oncology

## 2020-03-15 DIAGNOSIS — C61 Malignant neoplasm of prostate: Secondary | ICD-10-CM | POA: Diagnosis not present

## 2020-03-16 ENCOUNTER — Ambulatory Visit
Admission: RE | Admit: 2020-03-16 | Discharge: 2020-03-16 | Disposition: A | Discharge status: Home / Self Care | Payer: Medicare Other | Source: Ambulatory Visit | Attending: Radiation Oncology | Admitting: Radiation Oncology

## 2020-03-16 DIAGNOSIS — C61 Malignant neoplasm of prostate: Secondary | ICD-10-CM | POA: Diagnosis not present

## 2020-03-17 ENCOUNTER — Ambulatory Visit
Admission: RE | Admit: 2020-03-17 | Discharge: 2020-03-17 | Disposition: A | Discharge status: Home / Self Care | Payer: Medicare Other | Source: Ambulatory Visit | Attending: Radiation Oncology | Admitting: Radiation Oncology

## 2020-03-17 ENCOUNTER — Other Ambulatory Visit: Payer: Self-pay

## 2020-03-17 DIAGNOSIS — C61 Malignant neoplasm of prostate: Secondary | ICD-10-CM | POA: Diagnosis not present

## 2020-03-18 ENCOUNTER — Ambulatory Visit
Admission: RE | Admit: 2020-03-18 | Discharge: 2020-03-18 | Disposition: A | Discharge status: Home / Self Care | Payer: Medicare Other | Source: Ambulatory Visit | Attending: Radiation Oncology | Admitting: Radiation Oncology

## 2020-03-18 DIAGNOSIS — C61 Malignant neoplasm of prostate: Secondary | ICD-10-CM | POA: Diagnosis not present

## 2020-03-21 ENCOUNTER — Ambulatory Visit
Admission: RE | Admit: 2020-03-21 | Discharge: 2020-03-21 | Disposition: A | Discharge status: Home / Self Care | Payer: Medicare Other | Source: Ambulatory Visit | Attending: Radiation Oncology | Admitting: Radiation Oncology

## 2020-03-21 DIAGNOSIS — C61 Malignant neoplasm of prostate: Secondary | ICD-10-CM | POA: Diagnosis not present

## 2020-03-22 ENCOUNTER — Ambulatory Visit
Admission: RE | Admit: 2020-03-22 | Discharge: 2020-03-22 | Disposition: A | Discharge status: Home / Self Care | Payer: Medicare Other | Source: Ambulatory Visit | Attending: Radiation Oncology | Admitting: Radiation Oncology

## 2020-03-22 DIAGNOSIS — C61 Malignant neoplasm of prostate: Secondary | ICD-10-CM | POA: Diagnosis not present

## 2020-03-23 ENCOUNTER — Ambulatory Visit
Admission: RE | Admit: 2020-03-23 | Discharge: 2020-03-23 | Disposition: A | Discharge status: Home / Self Care | Payer: Medicare Other | Source: Ambulatory Visit | Attending: Radiation Oncology | Admitting: Radiation Oncology

## 2020-03-23 DIAGNOSIS — C61 Malignant neoplasm of prostate: Secondary | ICD-10-CM | POA: Diagnosis not present

## 2020-03-24 ENCOUNTER — Ambulatory Visit
Admission: RE | Admit: 2020-03-24 | Discharge: 2020-03-24 | Disposition: A | Discharge status: Home / Self Care | Payer: Medicare Other | Source: Ambulatory Visit | Attending: Radiation Oncology | Admitting: Radiation Oncology

## 2020-03-24 DIAGNOSIS — C61 Malignant neoplasm of prostate: Secondary | ICD-10-CM | POA: Diagnosis not present

## 2020-03-28 ENCOUNTER — Other Ambulatory Visit: Payer: Self-pay

## 2020-03-28 ENCOUNTER — Ambulatory Visit
Admission: RE | Admit: 2020-03-28 | Discharge: 2020-03-28 | Disposition: A | Discharge status: Home / Self Care | Payer: Medicare Other | Source: Ambulatory Visit | Attending: Radiation Oncology | Admitting: Radiation Oncology

## 2020-03-28 DIAGNOSIS — C61 Malignant neoplasm of prostate: Secondary | ICD-10-CM | POA: Diagnosis not present

## 2020-03-29 ENCOUNTER — Ambulatory Visit: Payer: Medicare Other

## 2020-03-30 ENCOUNTER — Ambulatory Visit
Admission: RE | Admit: 2020-03-30 | Discharge: 2020-03-30 | Disposition: A | Discharge status: Home / Self Care | Payer: Medicare Other | Source: Ambulatory Visit | Attending: Radiation Oncology | Admitting: Radiation Oncology

## 2020-03-30 DIAGNOSIS — C61 Malignant neoplasm of prostate: Secondary | ICD-10-CM | POA: Diagnosis not present

## 2020-03-31 ENCOUNTER — Ambulatory Visit
Admission: RE | Admit: 2020-03-31 | Discharge: 2020-03-31 | Disposition: A | Discharge status: Home / Self Care | Payer: Medicare Other | Source: Ambulatory Visit | Attending: Radiation Oncology | Admitting: Radiation Oncology

## 2020-03-31 DIAGNOSIS — C61 Malignant neoplasm of prostate: Secondary | ICD-10-CM | POA: Diagnosis not present

## 2020-04-04 ENCOUNTER — Other Ambulatory Visit: Payer: Self-pay

## 2020-04-04 ENCOUNTER — Ambulatory Visit
Admission: RE | Admit: 2020-04-04 | Discharge: 2020-04-04 | Disposition: A | Discharge status: Home / Self Care | Payer: Medicare Other | Source: Ambulatory Visit | Attending: Radiation Oncology | Admitting: Radiation Oncology

## 2020-04-04 DIAGNOSIS — C61 Malignant neoplasm of prostate: Secondary | ICD-10-CM | POA: Diagnosis not present

## 2020-04-05 ENCOUNTER — Other Ambulatory Visit: Payer: Self-pay

## 2020-04-05 ENCOUNTER — Ambulatory Visit
Admission: RE | Admit: 2020-04-05 | Discharge: 2020-04-05 | Disposition: A | Discharge status: Home / Self Care | Payer: Medicare Other | Source: Ambulatory Visit | Attending: Radiation Oncology | Admitting: Radiation Oncology

## 2020-04-05 DIAGNOSIS — C61 Malignant neoplasm of prostate: Secondary | ICD-10-CM | POA: Diagnosis not present

## 2020-04-06 ENCOUNTER — Ambulatory Visit
Admission: RE | Admit: 2020-04-06 | Discharge: 2020-04-06 | Disposition: A | Discharge status: Home / Self Care | Payer: Medicare Other | Source: Ambulatory Visit | Attending: Radiation Oncology | Admitting: Radiation Oncology

## 2020-04-06 ENCOUNTER — Other Ambulatory Visit: Payer: Self-pay

## 2020-04-06 DIAGNOSIS — C61 Malignant neoplasm of prostate: Secondary | ICD-10-CM | POA: Diagnosis not present

## 2020-04-07 ENCOUNTER — Other Ambulatory Visit: Payer: Self-pay

## 2020-04-07 ENCOUNTER — Ambulatory Visit
Admission: RE | Admit: 2020-04-07 | Discharge: 2020-04-07 | Disposition: A | Discharge status: Home / Self Care | Payer: Medicare Other | Source: Ambulatory Visit | Attending: Radiation Oncology | Admitting: Radiation Oncology

## 2020-04-07 DIAGNOSIS — C61 Malignant neoplasm of prostate: Secondary | ICD-10-CM | POA: Diagnosis not present

## 2020-04-08 ENCOUNTER — Ambulatory Visit
Admission: RE | Admit: 2020-04-08 | Discharge: 2020-04-08 | Disposition: A | Discharge status: Home / Self Care | Payer: Medicare Other | Source: Ambulatory Visit | Attending: Radiation Oncology | Admitting: Radiation Oncology

## 2020-04-08 ENCOUNTER — Other Ambulatory Visit: Payer: Self-pay

## 2020-04-08 DIAGNOSIS — C61 Malignant neoplasm of prostate: Secondary | ICD-10-CM | POA: Diagnosis not present

## 2020-04-11 ENCOUNTER — Ambulatory Visit
Admission: RE | Admit: 2020-04-11 | Discharge: 2020-04-11 | Disposition: A | Discharge status: Home / Self Care | Payer: Medicare Other | Source: Ambulatory Visit | Attending: Radiation Oncology | Admitting: Radiation Oncology

## 2020-04-11 ENCOUNTER — Other Ambulatory Visit: Payer: Self-pay

## 2020-04-11 DIAGNOSIS — C61 Malignant neoplasm of prostate: Secondary | ICD-10-CM | POA: Diagnosis not present

## 2020-04-12 ENCOUNTER — Ambulatory Visit
Admission: RE | Admit: 2020-04-12 | Discharge: 2020-04-12 | Disposition: A | Discharge status: Home / Self Care | Payer: Medicare Other | Source: Ambulatory Visit | Attending: Radiation Oncology | Admitting: Radiation Oncology

## 2020-04-12 ENCOUNTER — Other Ambulatory Visit: Payer: Self-pay

## 2020-04-12 DIAGNOSIS — C61 Malignant neoplasm of prostate: Secondary | ICD-10-CM | POA: Diagnosis not present

## 2020-04-13 ENCOUNTER — Ambulatory Visit
Admission: RE | Admit: 2020-04-13 | Discharge: 2020-04-13 | Disposition: A | Discharge status: Home / Self Care | Payer: Medicare Other | Source: Ambulatory Visit | Attending: Radiation Oncology | Admitting: Radiation Oncology

## 2020-04-13 ENCOUNTER — Ambulatory Visit (INDEPENDENT_AMBULATORY_CARE_PROVIDER_SITE_OTHER): Payer: Medicare Other | Admitting: Family Medicine

## 2020-04-13 ENCOUNTER — Encounter: Payer: Self-pay | Admitting: Family Medicine

## 2020-04-13 ENCOUNTER — Other Ambulatory Visit: Payer: Self-pay

## 2020-04-13 DIAGNOSIS — C61 Malignant neoplasm of prostate: Secondary | ICD-10-CM | POA: Diagnosis not present

## 2020-04-13 DIAGNOSIS — R7982 Elevated C-reactive protein (CRP): Secondary | ICD-10-CM | POA: Diagnosis not present

## 2020-04-13 DIAGNOSIS — E785 Hyperlipidemia, unspecified: Secondary | ICD-10-CM

## 2020-04-13 DIAGNOSIS — E7849 Other hyperlipidemia: Secondary | ICD-10-CM | POA: Diagnosis not present

## 2020-04-13 DIAGNOSIS — E559 Vitamin D deficiency, unspecified: Secondary | ICD-10-CM

## 2020-04-13 DIAGNOSIS — R7989 Other specified abnormal findings of blood chemistry: Secondary | ICD-10-CM

## 2020-04-13 DIAGNOSIS — R739 Hyperglycemia, unspecified: Secondary | ICD-10-CM | POA: Diagnosis not present

## 2020-04-13 DIAGNOSIS — M25521 Pain in right elbow: Secondary | ICD-10-CM | POA: Diagnosis not present

## 2020-04-13 NOTE — Progress Notes (Signed)
Office Visit Note   Patient: Brandon Flores           Date of Birth: 22-Mar-1946           MRN: [id number] Visit Date: 04/13/2020 Requested by: Eunice Blase, MD [address] [place],  [place] [address] PCP: Eunice Blase, MD  Subjective: Chief Complaint  Patient presents with  . Right Elbow - Pain    Started having pain in the elbow after having his 23rd radiation treatment on 04/10/20. He got an elbow sleeve for it. The pain then went to the right upper arm, then the wrist. It then went to the left wrist and now it hurts in both shoulders.    HPI: He is here with a couple concerns.  After 23 radiation treatments for prostate cancer, he started having aches and pains in his arms, as well as fatigue. This started a few days ago. His right elbow was hurting the worst, but it is starting to feel better.  He is also due for labs to monitor blood sugar elevation and elevated C-reactive protein as well as vitamin D deficiency and elevated TSH.                ROS:   All other systems were reviewed and are negative.  Objective: Vital Signs: There were no vitals taken for this visit.  Physical Exam:  General:  Alert and oriented, in no acute distress. Pulm:  Breathing unlabored. Psy:  Normal mood, congruent affect. Skin: No erythema or rash Elbows: Full range of motion of both elbows with no effusions. He is tender at the common extensor tendon at the lateral epicondyle bilaterally.   Imaging: No results found.  Assessment & Plan: 1. bilateral lateral epicondylitis -Seems to be improving on its own. No treatment today. Hopefully with time, once he is completely finished with radiation therapy, this will resolve.  2. Hyperglycemia, hyperlipidemia, elevated C-reactive protein, elevated TSH -Labs to monitor.     Procedures: No procedures performed        PMFS History: Patient Active Problem List   Diagnosis Date Noted  . Malignant neoplasm of prostate ([place])  02/03/2020  . Obesity (BMI 30-39.9) 08/31/2016  . Chronic neck pain 08/31/2016  . Cervical radiculopathy 05/03/2016  . HLD (hyperlipidemia) 05/03/2016  . GERD (gastroesophageal reflux disease) 04/25/2016  . Hyperuricemia 04/25/2016  . Low back pain without sciatica 04/25/2016  . Degenerative arthritis of left knee 04/25/2016  . Metatarsalgia of both feet 04/25/2016  . Gout 04/25/2016  . Migraine headache 04/25/2016  . Nuclear sclerosis of both eyes 04/25/2016  . History of colonic polyps 04/25/2016  . VBI (vertebrobasilar insufficiency) 04/25/2016  . Vertigo 03/10/2014  . Generalized convulsive epilepsy ([place]) 11/12/2013  . Choroidal nevus, right 10/12/2013  . Vitreous floaters 10/12/2013   Past Medical History:  Diagnosis Date  . Cervical radiculopathy   . Colon polyp    (2006- adenoma and tubulovillous adenoma; 2010-adenomas; repeat 201)-Dr. Wynetta Emery  . Complex partial seizure disorder ([place]) 10/08/2014  . GERD (gastroesophageal reflux disease)   . Gout   . Hypercholesterolemia   . Lateral epicondylitis    2002  . Migraine    headaches  . Ophthalmalgia    Dr. Gershon Crane  . Orthodontics    Dr. Junius Roads  . Partial epilepsy with impairment of consciousness ([place])   . Plantar fasciitis   . Prostate cancer ([place])   . Seizure disorder ([place])    Dr. Sabra Heck ([facility])  . Seizures ([place])   .  UTI (urinary tract infection)   . Vertebrobasilar insufficiency   . Vertigo     Family History  Problem Relation Age of Onset  . Alcohol abuse Father   . COPD Sister   . Lung cancer Sister   . Cancer Sister   . Lung cancer Brother   . Cancer Brother   . Cancer Sister        lung  . Lung cancer Sister   . Lung cancer Brother   . Cancer Brother   . Seizures Neg Hx   . Colon cancer Neg Hx   . Esophageal cancer Neg Hx   . Rectal cancer Neg Hx   . Stomach cancer Neg Hx   . Breast cancer Neg Hx   . Prostate cancer Neg Hx   . Pancreatic cancer Neg Hx     Past Surgical History:  Procedure  Laterality Date  . BACK SURGERY    . HEMORROIDECTOMY    . NECK SURGERY    . PROSTATE BIOPSY     Social History   Occupational History  . Occupation: Therapist, nutritional    Comment: [employer]  Tobacco Use  . Smoking status: Former Smoker    Packs/day: 3.00    Years: 20.00    Pack years: 60.00    Quit date: 04/02/1986    Years since quitting: 34.0  . Smokeless tobacco: Never Used  . Tobacco comment: Quit 1988, was a heavy smoker (20-30/day)  Vaping Use  . Vaping Use: Never used  Substance and Sexual Activity  . Alcohol use: No  . Drug use: No  . Sexual activity: Not Currently

## 2020-04-14 ENCOUNTER — Ambulatory Visit
Admission: RE | Admit: 2020-04-14 | Discharge: 2020-04-14 | Disposition: A | Discharge status: Home / Self Care | Payer: Medicare Other | Source: Ambulatory Visit | Attending: Radiation Oncology | Admitting: Radiation Oncology

## 2020-04-14 ENCOUNTER — Other Ambulatory Visit: Payer: Self-pay

## 2020-04-14 ENCOUNTER — Telehealth: Payer: Self-pay | Admitting: Family Medicine

## 2020-04-14 ENCOUNTER — Other Ambulatory Visit: Payer: Self-pay | Admitting: Family Medicine

## 2020-04-14 DIAGNOSIS — C61 Malignant neoplasm of prostate: Secondary | ICD-10-CM | POA: Diagnosis not present

## 2020-04-14 LAB — CBC WITH DIFFERENTIAL/PLATELET
Absolute Monocytes: 594 cells/uL (ref 200–950)
Basophils Absolute: 18 cells/uL (ref 0–200)
Basophils Relative: 0.4 %
Eosinophils Absolute: 162 cells/uL (ref 15–500)
Eosinophils Relative: 3.6 %
HCT: 45.1 % (ref 38.5–50.0)
Hemoglobin: 15.6 g/dL (ref 13.2–17.1)
Lymphs Abs: 918 cells/uL (ref 850–3900)
MCH: 31.2 pg (ref 27.0–33.0)
MCHC: 34.6 g/dL (ref 32.0–36.0)
MCV: 90.2 fL (ref 80.0–100.0)
MPV: 10.6 fL (ref 7.5–12.5)
Monocytes Relative: 13.2 %
Neutro Abs: 2808 cells/uL (ref 1500–7800)
Neutrophils Relative %: 62.4 %
Platelets: 192 10*3/uL (ref 140–400)
RBC: 5 10*6/uL (ref 4.20–5.80)
RDW: 13 % (ref 11.0–15.0)
Total Lymphocyte: 20.4 %
WBC: 4.5 10*3/uL (ref 3.8–10.8)

## 2020-04-14 LAB — COMPREHENSIVE METABOLIC PANEL
AG Ratio: 1.4 (calc) (ref 1.0–2.5)
ALT: 12 U/L (ref 9–46)
AST: 12 U/L (ref 10–35)
Albumin: 3.9 g/dL (ref 3.6–5.1)
Alkaline phosphatase (APISO): 81 U/L (ref 35–144)
BUN: 21 mg/dL (ref 7–25)
CO2: 27 mmol/L (ref 20–32)
Calcium: 9 mg/dL (ref 8.6–10.3)
Chloride: 102 mmol/L (ref 98–110)
Creat: 1.06 mg/dL (ref 0.70–1.18)
Globulin: 2.8 g/dL (calc) (ref 1.9–3.7)
Glucose, Bld: 113 mg/dL — ABNORMAL HIGH (ref 65–99)
Potassium: 4.4 mmol/L (ref 3.5–5.3)
Sodium: 140 mmol/L (ref 135–146)
Total Bilirubin: 0.3 mg/dL (ref 0.2–1.2)
Total Protein: 6.7 g/dL (ref 6.1–8.1)

## 2020-04-14 LAB — HIGH SENSITIVITY CRP: hs-CRP: >10 mg/L — ABNORMAL HIGH

## 2020-04-14 LAB — THYROID PANEL WITH TSH
Free Thyroxine Index: 1.2 — ABNORMAL LOW (ref 1.4–3.8)
T3 Uptake: 28 % (ref 22–35)
T4, Total: 4.4 ug/dL — ABNORMAL LOW (ref 4.9–10.5)
TSH: 8.12 mIU/L — ABNORMAL HIGH (ref 0.40–4.50)

## 2020-04-14 LAB — VITAMIN D 25 HYDROXY (VIT D DEFICIENCY, FRACTURES): Vit D, 25-Hydroxy: 34 ng/mL (ref 30–100)

## 2020-04-14 MED ORDER — LEVOTHYROXINE SODIUM 75 MCG PO TABS: 75.0000 ug | ORAL_TABLET | Freq: Every day | ORAL | 3 refills | Status: DC

## 2020-04-14 NOTE — Telephone Encounter (Signed)
Labs are notable for the following:  C-reactive protein, inflammation marker, remains elevated.  My suspicion is that this is due to prostate cancer and treatment.  CBC looks good.  Blood glucose remains slightly elevated at 113.  Remainder of metabolic panel looks good.  Thyroid panel is abnormal again with TSH of 8.12.  Target level is around 1-2.  I recommend increasing levothyroxine dosage to 75 mcg daily.  I will call in the prescription and we will recheck labs in about 3 to 4 months.  Vitamin D is lower than ideal at thirty-four.  Target level is 50-80.  What is current dosage?

## 2020-04-15 ENCOUNTER — Ambulatory Visit
Admission: RE | Admit: 2020-04-15 | Discharge: 2020-04-15 | Disposition: A | Discharge status: Home / Self Care | Payer: Medicare Other | Source: Ambulatory Visit | Attending: Radiation Oncology | Admitting: Radiation Oncology

## 2020-04-15 ENCOUNTER — Ambulatory Visit: Payer: Medicare Other

## 2020-04-15 ENCOUNTER — Other Ambulatory Visit: Payer: Self-pay

## 2020-04-15 DIAGNOSIS — C61 Malignant neoplasm of prostate: Secondary | ICD-10-CM | POA: Diagnosis not present

## 2020-04-15 NOTE — Progress Notes (Signed)
Wife and patient report that Dr. Junius Roads put the patient back on levothyroxine 75. Confirmed medication is listed in patient's record. Also, patient and wife request radiation records be sent to PCP, Dr. Eunice Blase. Inbasket message request sent to front office.

## 2020-04-18 ENCOUNTER — Ambulatory Visit: Payer: Medicare Other

## 2020-04-19 ENCOUNTER — Ambulatory Visit
Admission: RE | Admit: 2020-04-19 | Discharge: 2020-04-19 | Disposition: A | Discharge status: Home / Self Care | Payer: Medicare Other | Source: Ambulatory Visit | Attending: Radiation Oncology | Admitting: Radiation Oncology

## 2020-04-19 ENCOUNTER — Encounter: Payer: Self-pay | Admitting: Medical Oncology

## 2020-04-19 ENCOUNTER — Other Ambulatory Visit: Payer: Self-pay

## 2020-04-19 ENCOUNTER — Encounter: Payer: Self-pay | Admitting: Radiation Oncology

## 2020-04-19 DIAGNOSIS — C61 Malignant neoplasm of prostate: Secondary | ICD-10-CM | POA: Diagnosis not present

## 2020-04-20 ENCOUNTER — Telehealth: Payer: Self-pay | Admitting: Radiation Oncology

## 2020-04-20 NOTE — Telephone Encounter (Signed)
Received request to send radiation records to Dr. Lemar Livings, pcp. Patient finished radiation treatments yesterday, Jan 18. Awaiting EOT note to send all radiation records to Dr. Park Liter office.

## 2020-04-25 NOTE — Progress Notes (Signed)
  Radiation Oncology         ([phone]) [phone] ________________________________  Name: Brandon Flores MRN: [id number]  Date: 04/19/2020  DOB: [birth year]  End of Treatment Note  Diagnosis:   75 y.o. gentleman with Stage T2b adenocarcinoma of the prostate with Gleason score of 4+3, and PSA of 5.5.     Indication for treatment:  Curative, Definitive Radiotherapy       Radiation treatment dates:   03/17/20-04/19/20  Site/dose:   The prostate was treated to 70 Gy in 28 fractions of 2.5 Gy  Beams/energy:   The patient was treated with IMRT using volumetric arc therapy delivering 6 MV X-rays to clockwise and counterclockwise circumferential arcs with a 90 degree collimator offset to avoid dose scalloping.  Image guidance was performed with daily cone beam CT prior to each fraction to align to gold markers in the prostate and assure proper bladder and rectal fill volumes.  Immobilization was achieved with BodyFix custom mold.  Narrative: The patient tolerated radiation treatment relatively well.   The patient experienced some minor urinary irritation and modest fatigue.    Plan: The patient has completed radiation treatment. He will return to radiation oncology clinic for routine followup in one month. I advised him to call or return sooner if he has any questions or concerns related to his recovery or treatment. ________________________________  Sheral Apley. Tammi Klippel, M.D.

## 2020-05-12 NOTE — Progress Notes (Signed)
Patient states that he does not empty his bladder with urination. Patient states that he can go up to three hours without voiding. Patient states that he has a strong stream and that he does not have to strain to start his stream. Patient reports nocturia x2. Patient denies any dysuria or hematuria. Patient states that he has some constipation. States that he is taking a stool softener for relief.Patient states that he has some urgency with urination. Patient denies any leakage. Patient reports mild fatigue. Patient states the he does not have an appointment with [facility] . Patients IPPS was a 8

## 2020-05-19 ENCOUNTER — Ambulatory Visit
Admission: RE | Admit: 2020-05-19 | Discharge: 2020-05-19 | Disposition: A | Discharge status: Home / Self Care | Payer: Medicare Other | Source: Ambulatory Visit | Attending: Urology | Admitting: Urology

## 2020-05-19 ENCOUNTER — Other Ambulatory Visit: Payer: Self-pay

## 2020-05-19 DIAGNOSIS — C61 Malignant neoplasm of prostate: Secondary | ICD-10-CM

## 2020-05-19 NOTE — Progress Notes (Signed)
Radiation Oncology         [phone] ________________________________  Name: TAGGART PRASAD Sr MRN: [id number]  Date: 05/19/2020  DOB: 07/06/1945  Post Treatment Note  CC: Eunice Blase, MD  Ardis Hughs, MD, Jacalyn Lefevre, MD  Diagnosis:   75 y.o. gentleman with Stage T2b adenocarcinoma of the prostate with Gleason score of 4+3, and PSA of 5.5.  Interval Since Last Radiation:  4.5 weeks  03/17/20-04/19/20: The prostate was treated to 70 Gy in 28 fractions of 2.5 Gy  Narrative:  I spoke with the patient to conduct his routine scheduled 1 month follow up visit via telephone to spare the patient unnecessary potential exposure in the healthcare setting during the current COVID-19 pandemic.  The patient was notified in advance and gave permission to proceed with this visit format. He tolerated radiation treatment relatively well.   The patient experienced some minor urinary irritation and modest fatigue.                                On review of systems, the patient states that he is doing well in general.  He reports gradual improvement in his LUTS and is almost back to his baseline at this point.  His current IPSS score is 8 with nocturia 2 times per night and only mild residual urgency, frequency and small volume leakage if he postpones voiding for too long.  He reports a strong, steady flow of stream and specifically denies dysuria or gross hematuria.  He has occasional constipation which is managed with stool softeners as needed.  He reports a healthy appetite and is maintaining his weight.  He denies abdominal pain, nausea, vomiting or diarrhea.  He reports that his energy level is gradually improving as well and overall, he is quite pleased with his progress to date.  He has continued taking Flomax 0.8 mg p.o. nightly.  ALLERGIES:  has No Known Allergies.  Meds: Current Outpatient Medications  Medication Sig Dispense Refill  . allopurinol (ZYLOPRIM) 100 MG tablet Take 1  tablet (100 mg total) by mouth daily. 90 tablet 3  . baclofen (LIORESAL) 10 MG tablet Take 10 mg by mouth 2 (two) times daily.    . carbamazepine (TEGRETOL) 200 MG tablet Take 2 tablets (400 mg total) by mouth 3 (three) times daily. WILL TRY SWITCH TO GENERIC 540 tablet 3  . dutasteride (AVODART) 0.5 MG capsule Take 1 capsule (0.5 mg total) by mouth every evening. 30 capsule 6  . levothyroxine (SYNTHROID) 75 MCG tablet Take 1 tablet (75 mcg total) by mouth daily before breakfast. (Patient taking differently: Take 75 mcg by mouth daily before breakfast. Patient states that he is taking 50mg ) 30 tablet 3  . meclizine (ANTIVERT) 25 MG tablet Take 1 tablet (25 mg total) by mouth 3 (three) times daily as needed for dizziness. 30 tablet 6  . meloxicam (MOBIC) 15 MG tablet Take 15 mg by mouth daily.    Marland Kitchen OVER THE COUNTER MEDICATION Stool softener with probiotic twice a week.    . tamsulosin (FLOMAX) 0.4 MG CAPS capsule Take 1 capsule (0.4 mg total) by mouth daily after supper. (Patient taking differently: Take 0.8 mg by mouth daily after supper.) 30 capsule 6  . ibuprofen (ADVIL) 800 MG tablet Take 1 tablet (800 mg total) by mouth 3 (three) times daily. (Patient not taking: No sig reported) 21 tablet 0   No current facility-administered medications for  this encounter.    Physical Findings:  vitals were not taken for this visit.   /Unable to assess due to telephone follow-up visit format.  Lab Findings: Lab Results  Component Value Date   WBC 4.5 04/13/2020   HGB 15.6 04/13/2020   HCT 45.1 04/13/2020   MCV 90.2 04/13/2020   PLT 192 04/13/2020     Radiographic Findings: No results found.  Impression/Plan: 1. 75 y.o. gentleman with Stage T2b adenocarcinoma of the prostate with Gleason score of 4+3, and PSA of 5.5. He will continue to follow up with urology for ongoing PSA determinations but does not have a posttreatment follow-up appointment scheduled with Dr. Claudia Desanctis to his knowledge. He  understands what to expect with regards to PSA monitoring going forward. I will look forward to following his response to treatment via correspondence with urology, and would be happy to continue to participate in his care if clinically indicated. I talked to the patient about what to expect in the future, including his risk for erectile dysfunction and rectal bleeding. I encouraged him to call or return to the office if he has any questions regarding his previous radiation or possible radiation side effects. He was comfortable with this plan and will follow up as needed.    Nicholos Johns, PA-C

## 2020-06-03 ENCOUNTER — Other Ambulatory Visit: Payer: Self-pay | Admitting: Family Medicine

## 2020-06-03 DIAGNOSIS — M545 Low back pain, unspecified: Secondary | ICD-10-CM

## 2020-06-03 MED ORDER — HYDROCODONE-ACETAMINOPHEN 5-325 MG PO TABS: 1.0000 | ORAL_TABLET | Freq: Four times a day (QID) | ORAL | 0 refills | Status: DC | PRN

## 2020-06-03 NOTE — Progress Notes (Signed)
Having increasing back pain and requests referral to Dr. Ellene Route.

## 2020-06-15 DIAGNOSIS — H25813 Combined forms of age-related cataract, bilateral: Secondary | ICD-10-CM | POA: Diagnosis not present

## 2020-06-15 DIAGNOSIS — H4322 Crystalline deposits in vitreous body, left eye: Secondary | ICD-10-CM | POA: Diagnosis not present

## 2020-06-15 DIAGNOSIS — H02834 Dermatochalasis of left upper eyelid: Secondary | ICD-10-CM | POA: Diagnosis not present

## 2020-06-15 DIAGNOSIS — H02831 Dermatochalasis of right upper eyelid: Secondary | ICD-10-CM | POA: Diagnosis not present

## 2020-06-27 ENCOUNTER — Encounter: Payer: Self-pay | Admitting: Family Medicine

## 2020-06-27 DIAGNOSIS — R739 Hyperglycemia, unspecified: Secondary | ICD-10-CM

## 2020-06-28 NOTE — Addendum Note (Signed)
Addended by: Hortencia Pilar on: 06/28/2020 10:47 AM   Modules accepted: Orders

## 2020-06-30 ENCOUNTER — Encounter: Payer: Self-pay | Admitting: Family Medicine

## 2020-06-30 ENCOUNTER — Ambulatory Visit (INDEPENDENT_AMBULATORY_CARE_PROVIDER_SITE_OTHER): Payer: Medicare Other

## 2020-06-30 ENCOUNTER — Other Ambulatory Visit: Payer: Self-pay

## 2020-06-30 DIAGNOSIS — R739 Hyperglycemia, unspecified: Secondary | ICD-10-CM | POA: Diagnosis not present

## 2020-06-30 NOTE — Progress Notes (Signed)
Fasting labs drawn per Dr. Junius Roads: hgbA1C and BMET.

## 2020-07-01 ENCOUNTER — Encounter: Payer: Self-pay | Admitting: Family Medicine

## 2020-07-01 ENCOUNTER — Telehealth: Payer: Self-pay | Admitting: Family Medicine

## 2020-07-01 LAB — BASIC METABOLIC PANEL
BUN: 17 mg/dL (ref 7–25)
CO2: 25 mmol/L (ref 20–32)
Calcium: 9.1 mg/dL (ref 8.6–10.3)
Chloride: 104 mmol/L (ref 98–110)
Creat: 1.04 mg/dL (ref 0.70–1.18)
Glucose, Bld: 102 mg/dL — ABNORMAL HIGH (ref 65–99)
Potassium: 4.2 mmol/L (ref 3.5–5.3)
Sodium: 140 mmol/L (ref 135–146)

## 2020-07-01 LAB — HEMOGLOBIN A1C
Hgb A1c MFr Bld: 5 % of total Hgb (ref <5.7)
Mean Plasma Glucose: 97 mg/dL
eAG (mmol/L): 5.4 mmol/L

## 2020-07-01 MED ORDER — TAMSULOSIN HCL 0.4 MG PO CAPS
0.8000 mg | ORAL_CAPSULE | Freq: Every day | ORAL | 3 refills | Status: DC
Start: 2020-07-01 — End: 2020-08-19

## 2020-07-01 NOTE — Telephone Encounter (Signed)
A1C looks good at 5.0.  No need for medicines at this point, but very important to limit intake of breads; pastas; cereals; sugars/sweets.  Recheck in 4-6 months.

## 2020-07-05 ENCOUNTER — Encounter: Payer: Self-pay | Admitting: Medical Oncology

## 2020-07-05 NOTE — Progress Notes (Signed)
I spoke with [facility] @ [facility] to request 3 months post radiation lab/MD appointment.She will contact patient with date and time. I notified patient of the above.I asked him to call me back if he has questions or concerns.  He voiced understanding.

## 2020-07-06 DIAGNOSIS — C61 Malignant neoplasm of prostate: Secondary | ICD-10-CM | POA: Diagnosis not present

## 2020-07-19 DIAGNOSIS — C61 Malignant neoplasm of prostate: Secondary | ICD-10-CM | POA: Diagnosis not present

## 2020-07-19 DIAGNOSIS — R3915 Urgency of urination: Secondary | ICD-10-CM | POA: Diagnosis not present

## 2020-07-19 DIAGNOSIS — R350 Frequency of micturition: Secondary | ICD-10-CM | POA: Diagnosis not present

## 2020-08-05 ENCOUNTER — Other Ambulatory Visit: Payer: Self-pay | Admitting: Family Medicine

## 2020-08-12 ENCOUNTER — Encounter: Payer: Self-pay | Admitting: Family Medicine

## 2020-08-17 DIAGNOSIS — M4316 Spondylolisthesis, lumbar region: Secondary | ICD-10-CM | POA: Diagnosis not present

## 2020-08-17 DIAGNOSIS — M5136 Other intervertebral disc degeneration, lumbar region: Secondary | ICD-10-CM | POA: Diagnosis not present

## 2020-08-17 DIAGNOSIS — R030 Elevated blood-pressure reading, without diagnosis of hypertension: Secondary | ICD-10-CM | POA: Diagnosis not present

## 2020-08-17 DIAGNOSIS — Z6835 Body mass index (BMI) 35.0-35.9, adult: Secondary | ICD-10-CM | POA: Diagnosis not present

## 2020-08-19 ENCOUNTER — Other Ambulatory Visit: Payer: Self-pay

## 2020-08-19 ENCOUNTER — Ambulatory Visit (INDEPENDENT_AMBULATORY_CARE_PROVIDER_SITE_OTHER): Payer: Medicare Other | Admitting: Family Medicine

## 2020-08-19 VITALS — BP 124/73 | HR 65 | Ht 69.0 in | Wt 244.6 lb

## 2020-08-19 DIAGNOSIS — K5909 Other constipation: Secondary | ICD-10-CM

## 2020-08-19 DIAGNOSIS — R7989 Other specified abnormal findings of blood chemistry: Secondary | ICD-10-CM

## 2020-08-19 DIAGNOSIS — E034 Atrophy of thyroid (acquired): Secondary | ICD-10-CM

## 2020-08-19 MED ORDER — DUTASTERIDE 0.5 MG PO CAPS: 0.5000 mg | ORAL_CAPSULE | Freq: Every evening | ORAL | 3 refills | Status: AC

## 2020-08-19 MED ORDER — HYDROCODONE-ACETAMINOPHEN 5-325 MG PO TABS
1.0000 | ORAL_TABLET | Freq: Four times a day (QID) | ORAL | 0 refills | Status: DC | PRN
Start: 2020-08-19 — End: 2021-12-06

## 2020-08-19 MED ORDER — TAMSULOSIN HCL 0.4 MG PO CAPS: 0.8000 mg | ORAL_CAPSULE | Freq: Every day | ORAL | 3 refills | Status: DC

## 2020-08-19 MED ORDER — ALLOPURINOL 100 MG PO TABS: 100.0000 mg | ORAL_TABLET | Freq: Every day | ORAL | 3 refills | Status: AC

## 2020-08-19 MED ORDER — CARBAMAZEPINE 200 MG PO TABS: 400.0000 mg | ORAL_TABLET | Freq: Three times a day (TID) | ORAL | 3 refills | Status: DC

## 2020-08-19 NOTE — Progress Notes (Signed)
Office Visit Note   Patient: Brandon Flores           Date of Birth: [birth year]           MRN: [id number] Visit Date: 08/19/2020 Requested by: Eunice Blase, MD [place],  [place] [address] PCP: Eunice Blase, MD  Subjective: Chief Complaint  Patient presents with  . Constipation    This has gotten worse after radiation. Takes him 15-20 minutes of sitting and straining to finally have a BM. Normal in form and color. Some bright blood on the tissue, but that has also been going on a while - he says he has hemorrhoids. Is being set up for [facility] for his low back pain, by Dr. Ellene Route.    HPI: He is here to discuss chronic constipation.  He has struggled with this for years, but it has gotten worse since he completed his radiation treatments.  Occasionally he has bleeding after a bowel movement but not every time.  Usually once he passes a hard stool, then he is able to completely empty himself.  He is on 75 mcg levothyroxine since January, when his TSH was still too high.  He also takes hydrocodone occasionally for his chronic back pain but not on a daily basis.  He is getting ready to have an epidural injection per Dr. Ellene Route next week.                ROS:   All other systems were reviewed and are negative.  Objective: Vital Signs: BP 124/73   Pulse 65   Ht 5\' 9"  (1.753 m)   Wt 244 lb 9.6 oz (110.9 kg)   BMI 36.12 kg/m   Physical Exam:  General:  Alert and oriented, in no acute distress. Pulm:  Breathing unlabored. Psy:  Normal mood, congruent affect.    Imaging: No results found.  Assessment & Plan: 1.  Chronic constipation, probably multifactorial.  Thyroid dysfunction is likely contributing as is pain medicine, and diet. -We will check thyroid function today and optimize therapy.  If constipation does not improve, then we might encourage him to try some dietary changes.  There are some over-the-counter supplements that might help as  well.     Procedures: No procedures performed        PMFS History: Patient Active Problem List   Diagnosis Date Noted  . Malignant neoplasm of prostate ([place]) 02/03/2020  . Obesity (BMI 30-39.9) 08/31/2016  . Chronic neck pain 08/31/2016  . Cervical radiculopathy 05/03/2016  . HLD (hyperlipidemia) 05/03/2016  . GERD (gastroesophageal reflux disease) 04/25/2016  . Hyperuricemia 04/25/2016  . Low back pain without sciatica 04/25/2016  . Degenerative arthritis of left knee 04/25/2016  . Metatarsalgia of both feet 04/25/2016  . Gout 04/25/2016  . Migraine headache 04/25/2016  . Nuclear sclerosis of both eyes 04/25/2016  . History of colonic polyps 04/25/2016  . VBI (vertebrobasilar insufficiency) 04/25/2016  . Vertigo 03/10/2014  . Generalized convulsive epilepsy ([place]) 11/12/2013  . Choroidal nevus, right 10/12/2013  . Vitreous floaters 10/12/2013   Past Medical History:  Diagnosis Date  . Cervical radiculopathy   . Colon polyp    (2006- adenoma and tubulovillous adenoma; 2010-adenomas; repeat 201)-Dr. Wynetta Emery  . Complex partial seizure disorder ([place]) 10/08/2014  . GERD (gastroesophageal reflux disease)   . Gout   . Hypercholesterolemia   . Lateral epicondylitis    2002  . Migraine    headaches  . Ophthalmalgia    Dr. Gershon Crane  .  Orthodontics    Dr. Junius Roads  . Partial epilepsy with impairment of consciousness ([place])   . Plantar fasciitis   . Prostate cancer ([place])   . Seizure disorder ([place])    Dr. Sabra Heck ([place])  . Seizures ([place])   . UTI (urinary tract infection)   . Vertebrobasilar insufficiency   . Vertigo     Family History  Problem Relation Age of Onset  . Alcohol abuse Father   . COPD Sister   . Lung cancer Sister   . Cancer Sister   . Lung cancer Brother   . Cancer Brother   . Cancer Sister        lung  . Lung cancer Sister   . Lung cancer Brother   . Cancer Brother   . Seizures Neg Hx   . Colon cancer Neg Hx   . Esophageal cancer Neg Hx   . Rectal  cancer Neg Hx   . Stomach cancer Neg Hx   . Breast cancer Neg Hx   . Prostate cancer Neg Hx   . Pancreatic cancer Neg Hx     Past Surgical History:  Procedure Laterality Date  . BACK SURGERY    . HEMORROIDECTOMY    . NECK SURGERY    . PROSTATE BIOPSY     Social History   Occupational History  . Occupation: Therapist, nutritional    Comment: [employer]  Tobacco Use  . Smoking status: Former Smoker    Packs/day: 3.00    Years: 20.00    Pack years: 60.00    Quit date: 04/02/1986    Years since quitting: 34.4  . Smokeless tobacco: Never Used  . Tobacco comment: Quit 1988, was a heavy smoker (20-30/day)  Vaping Use  . Vaping Use: Never used  Substance and Sexual Activity  . Alcohol use: No  . Drug use: No  . Sexual activity: Not Currently

## 2020-08-20 LAB — TSH: TSH: 6.2 mIU/L — ABNORMAL HIGH (ref 0.40–4.50)

## 2020-08-22 ENCOUNTER — Telehealth: Payer: Self-pay | Admitting: Family Medicine

## 2020-08-22 MED ORDER — LEVOTHYROXINE SODIUM 88 MCG PO TABS: 88.0000 ug | ORAL_TABLET | Freq: Every day | ORAL | 6 refills | Status: DC

## 2020-08-22 NOTE — Telephone Encounter (Signed)
Thyroid is still undermedicated.  This could definitely contribute to your current symptoms.  We want the TSH to be closer to 1.0.  I will increase the dosage of your thyroid medication and you should have it rechecked in 2 to 3 months.

## 2020-09-01 DIAGNOSIS — M5416 Radiculopathy, lumbar region: Secondary | ICD-10-CM | POA: Diagnosis not present

## 2020-09-01 DIAGNOSIS — M5116 Intervertebral disc disorders with radiculopathy, lumbar region: Secondary | ICD-10-CM | POA: Diagnosis not present

## 2020-09-04 ENCOUNTER — Other Ambulatory Visit: Payer: Self-pay | Admitting: Family Medicine

## 2020-09-12 ENCOUNTER — Other Ambulatory Visit: Payer: Self-pay

## 2020-09-12 ENCOUNTER — Encounter: Payer: Self-pay | Admitting: Family Medicine

## 2020-09-12 ENCOUNTER — Ambulatory Visit (INDEPENDENT_AMBULATORY_CARE_PROVIDER_SITE_OTHER): Payer: Medicare Other | Admitting: Family Medicine

## 2020-09-12 VITALS — BP 123/70 | HR 75 | Ht 69.0 in | Wt 240.2 lb

## 2020-09-12 DIAGNOSIS — K5909 Other constipation: Secondary | ICD-10-CM

## 2020-09-12 MED ORDER — SENNOSIDES 8.6 MG PO TABS: 2.0000 | ORAL_TABLET | Freq: Every evening | ORAL | 6 refills | Status: DC | PRN

## 2020-09-12 MED ORDER — POLYETHYLENE GLYCOL 3350 17 GM/SCOOP PO POWD: ORAL | 6 refills | Status: DC

## 2020-09-12 NOTE — Progress Notes (Signed)
Office Visit Note   Patient: Brandon Flores           Date of Birth: 10/08/1945           MRN: [id number] Visit Date: 09/12/2020 Requested by: Eunice Blase, MD [place],  [place] [address] PCP: Eunice Blase, MD  Subjective: Chief Complaint  Patient presents with   Constipation    Still having difficulty with bowel movements. The "first inch is hard (consistency)" and then the rest is normal. Burning/pain in the rectal area afterward.    HPI: Here for follow-up constipation.  Still having issues, sometimes he goes 2 days without a bowel movement and then has a stool which is very hard at first, and then soft after that.  He is having occasional blood on the outside of the stool, not a lot of blood.  Sometimes it stings after he has a bowel movement for about an hour.  He is now on 88 mcg of Synthroid for the past month.  He has not noticed much difference so far.                ROS:   All other systems were reviewed and are negative.  Objective: Vital Signs: BP 123/70 (BP Location: Right Arm, Patient Position: Sitting, Cuff Size: Normal)   Pulse 75   Ht 5\' 9"  (1.753 m)   Wt 240 lb 3.2 oz (109 kg)   BMI 35.47 kg/m   Physical Exam:  General:  Alert and oriented, in no acute distress. Pulm:  Breathing unlabored. Psy:  Normal mood, congruent affec  No exam done  Imaging: No results found.  Assessment & Plan: Chronic constipation, probably multifactorial.  Suspect partially related to hypothyroidism. - While we are adjusting his thyroid medication, he will try Senokot at bedtime and polyethylene glycol at noon and in the evening.     Procedures: No procedures performed        PMFS History: Patient Active Problem List   Diagnosis Date Noted   Malignant neoplasm of prostate ([place]) 02/03/2020   Obesity (BMI 30-39.9) 08/31/2016   Chronic neck pain 08/31/2016   Cervical radiculopathy 05/03/2016   HLD (hyperlipidemia) 05/03/2016   GERD  (gastroesophageal reflux disease) 04/25/2016   Hyperuricemia 04/25/2016   Low back pain without sciatica 04/25/2016   Degenerative arthritis of left knee 04/25/2016   Metatarsalgia of both feet 04/25/2016   Gout 04/25/2016   Migraine headache 04/25/2016   Nuclear sclerosis of both eyes 04/25/2016   History of colonic polyps 04/25/2016   VBI (vertebrobasilar insufficiency) 04/25/2016   Vertigo 03/10/2014   Generalized convulsive epilepsy ([place]) 11/12/2013   Choroidal nevus, right 10/12/2013   Vitreous floaters 10/12/2013   Past Medical History:  Diagnosis Date   Cervical radiculopathy    Colon polyp    (2006- adenoma and tubulovillous adenoma; 2010-adenomas; repeat 201)-Dr. Wynetta Emery   Complex partial seizure disorder ([place]) 10/08/2014   GERD (gastroesophageal reflux disease)    Gout    Hypercholesterolemia    Lateral epicondylitis    2002   Migraine    headaches   Ophthalmalgia    Dr. Gershon Crane   Orthodontics    Dr. Junius Roads   Partial epilepsy with impairment of consciousness ([place])    Plantar fasciitis    Prostate cancer ([place])    Seizure disorder ([place])    Dr. Sabra Heck ([place])   Seizures ([place])    UTI (urinary tract infection)    Vertebrobasilar insufficiency    Vertigo  Family History  Problem Relation Age of Onset   Alcohol abuse Father    COPD Sister    Lung cancer Sister    Cancer Sister    Lung cancer Brother    Cancer Brother    Cancer Sister        lung   Lung cancer Sister    Lung cancer Brother    Cancer Brother    Seizures Neg Hx    Colon cancer Neg Hx    Esophageal cancer Neg Hx    Rectal cancer Neg Hx    Stomach cancer Neg Hx    Breast cancer Neg Hx    Prostate cancer Neg Hx    Pancreatic cancer Neg Hx     Past Surgical History:  Procedure Laterality Date   BACK SURGERY     HEMORROIDECTOMY     NECK SURGERY     PROSTATE BIOPSY     Social History   Occupational History   Occupation: Therapist, nutritional    Comment: [employer]  Tobacco Use    Smoking status: Former    Packs/day: 3.00    Years: 20.00    Pack years: 60.00    Types: Cigarettes    Quit date: 04/02/1986    Years since quitting: 34.4   Smokeless tobacco: Never   Tobacco comments:    Quit 1988, was a heavy smoker (20-30/day)  Vaping Use   Vaping Use: Never used  Substance and Sexual Activity   Alcohol use: No   Drug use: No   Sexual activity: Not Currently

## 2020-09-12 NOTE — Patient Instructions (Signed)
     For constipation:  - Polyethylene glycol:  1 scoop in 8 oz water at noon and in evening  - Senokot:  2 pills at bedtime as needed

## 2020-09-18 ENCOUNTER — Encounter: Payer: Self-pay | Admitting: Family Medicine

## 2020-10-27 DIAGNOSIS — S0181XA Laceration without foreign body of other part of head, initial encounter: Secondary | ICD-10-CM | POA: Diagnosis not present

## 2020-10-31 ENCOUNTER — Ambulatory Visit (INDEPENDENT_AMBULATORY_CARE_PROVIDER_SITE_OTHER): Payer: Medicare Other

## 2020-10-31 ENCOUNTER — Ambulatory Visit (INDEPENDENT_AMBULATORY_CARE_PROVIDER_SITE_OTHER): Payer: Medicare Other | Admitting: Family Medicine

## 2020-10-31 ENCOUNTER — Other Ambulatory Visit: Payer: Self-pay

## 2020-10-31 ENCOUNTER — Encounter: Payer: Self-pay | Admitting: Family Medicine

## 2020-10-31 DIAGNOSIS — M545 Low back pain, unspecified: Secondary | ICD-10-CM | POA: Diagnosis not present

## 2020-10-31 DIAGNOSIS — G8929 Other chronic pain: Secondary | ICD-10-CM | POA: Diagnosis not present

## 2020-10-31 DIAGNOSIS — M79645 Pain in left finger(s): Secondary | ICD-10-CM

## 2020-10-31 NOTE — Progress Notes (Signed)
Office Visit Note   Patient: Brandon Flores           Date of Birth: 01-20-1946           MRN: [id number] Visit Date: 10/31/2020 Requested by: Eunice Blase, MD [address] [place],  [place] [address] PCP: Eunice Blase, MD  Subjective: Chief Complaint  Patient presents with   Lower Back - Pain    Continues to have pain in the lower back, bilaterally - constant. ESI by Dr. Ellene Route 07/29/20 lasted about 2 months.    Left Ring Finger - Pain    Fell forward down 3 steps, hitting the left side of his head on a rounded edge of the dryer. Went to urgent care. He caught himself with the left outstretched hand ----- left ring finger was "jammed" and it swelled.     HPI: He is here with left third finger pain.  Recently he fell down 3 steps in his house, he hit his head against the dryer and sustained a laceration on the left side of his forehead.  He also somehow injured his third finger.  He went to urgent care and had the laceration glued.  His finger continues to hurt although it seems to be getting somewhat better.  Pain is at the PIP joint.  He also states that his low back is bothering him quite a bit again.  Epidural injection helped for about 2 months and then it wore off.  He states that the pain is tolerable and he is learning to live with it.                ROS:   All other systems were reviewed and are negative.  Objective: Vital Signs: There were no vitals taken for this visit.  Physical Exam:  General:  Alert and oriented, in no acute distress. Pulm:  Breathing unlabored. Psy:  Normal mood, congruent affect. Skin: There is no bruising or erythema of his left third finger.  His abrasion on his left forehead area is healing well. Left third finger: He is tender to palpation on the volar aspect of the PIP joint.  He has no detectable laxity of the ligaments at that joint.  Intact flexor and extensor tendon mechanisms.    Imaging: XR Finger Ring Left  Result Date:  10/31/2020 X-rays of the left third finger reveal a tiny avulsion fragment on the volar aspect of the PIP joint.  No other abnormality seen.   Assessment & Plan: Recently status post follow-up with left third finger volar plate avulsion fracture at the PIP joint. -Continue to work on range of motion to tolerance.  Anticipate 6 to 12 weeks healing time.  Follow-up as needed.  2.  Chronic low back pain - He is not interested in another injection right now.  He plans to live with his pain until he cannot tolerate it and then consider repeat injection at that point.     Procedures: No procedures performed        PMFS History: Patient Active Problem List   Diagnosis Date Noted   Malignant neoplasm of prostate ([place]) 02/03/2020   Obesity (BMI 30-39.9) 08/31/2016   Chronic neck pain 08/31/2016   Cervical radiculopathy 05/03/2016   HLD (hyperlipidemia) 05/03/2016   GERD (gastroesophageal reflux disease) 04/25/2016   Hyperuricemia 04/25/2016   Low back pain without sciatica 04/25/2016   Degenerative arthritis of left knee 04/25/2016   Metatarsalgia of both feet 04/25/2016   Gout 04/25/2016  Migraine headache 04/25/2016   Nuclear sclerosis of both eyes 04/25/2016   History of colonic polyps 04/25/2016   VBI (vertebrobasilar insufficiency) 04/25/2016   Vertigo 03/10/2014   Generalized convulsive epilepsy ([place]) 11/12/2013   Choroidal nevus, right 10/12/2013   Vitreous floaters 10/12/2013   Past Medical History:  Diagnosis Date   Cervical radiculopathy    Colon polyp    (2006- adenoma and tubulovillous adenoma; 2010-adenomas; repeat 201)-Dr. Wynetta Emery   Complex partial seizure disorder ([place]) 10/08/2014   GERD (gastroesophageal reflux disease)    Gout    Hypercholesterolemia    Lateral epicondylitis    2002   Migraine    headaches   Ophthalmalgia    Dr. Gershon Crane   Orthodontics    Dr. Junius Roads   Partial epilepsy with impairment of consciousness ([place])    Plantar fasciitis     Prostate cancer ([place])    Seizure disorder ([place])    Dr. Sabra Heck ([place])   Seizures ([place])    UTI (urinary tract infection)    Vertebrobasilar insufficiency    Vertigo     Family History  Problem Relation Age of Onset   Alcohol abuse Father    COPD Sister    Lung cancer Sister    Cancer Sister    Lung cancer Brother    Cancer Brother    Cancer Sister        lung   Lung cancer Sister    Lung cancer Brother    Cancer Brother    Seizures Neg Hx    Colon cancer Neg Hx    Esophageal cancer Neg Hx    Rectal cancer Neg Hx    Stomach cancer Neg Hx    Breast cancer Neg Hx    Prostate cancer Neg Hx    Pancreatic cancer Neg Hx     Past Surgical History:  Procedure Laterality Date   BACK SURGERY     HEMORROIDECTOMY     NECK SURGERY     PROSTATE BIOPSY     Social History   Occupational History   Occupation: Therapist, nutritional    Comment: [employer]  Tobacco Use   Smoking status: Former    Packs/day: 3.00    Years: 20.00    Pack years: 60.00    Types: Cigarettes    Quit date: 04/02/1986    Years since quitting: 34.6   Smokeless tobacco: Never   Tobacco comments:    Quit 1988, was a heavy smoker (20-30/day)  Vaping Use   Vaping Use: Never used  Substance and Sexual Activity   Alcohol use: No   Drug use: No   Sexual activity: Not Currently

## 2020-11-10 ENCOUNTER — Ambulatory Visit: Payer: Medicare Other | Admitting: Neurology

## 2020-12-06 ENCOUNTER — Encounter: Payer: Self-pay | Admitting: Adult Health

## 2020-12-06 ENCOUNTER — Ambulatory Visit (INDEPENDENT_AMBULATORY_CARE_PROVIDER_SITE_OTHER): Payer: Medicare Other | Admitting: Adult Health

## 2020-12-06 VITALS — BP 137/88 | HR 63 | Ht 69.0 in | Wt 248.2 lb

## 2020-12-06 DIAGNOSIS — G40309 Generalized idiopathic epilepsy and epileptic syndromes, not intractable, without status epilepticus: Secondary | ICD-10-CM

## 2020-12-06 NOTE — Patient Instructions (Signed)
Your Plan:  Continue carbamazepine Blood work today If your symptoms worsen or you develop new symptoms please let us know.    Thank you for coming to see [place] at [facility]. I hope we have been able to provide you high quality care today.  You may receive a patient satisfaction survey over the next few weeks. We would appreciate your feedback and comments so that we may continue to improve ourselves and the health of our patients.

## 2020-12-06 NOTE — Progress Notes (Signed)
PATIENT: Brandon Flores DOB: 09-16-1945  REASON FOR VISIT: follow up HISTORY FROM: patient PRIMARY NEUROLOGIST: willis  HISTORY OF PRESENT ILLNESS: Today 12/06/20:  [name] is a 75 year old male with a history of seizures.  He returns today for follow-up.  He is now on carbamazepine and so far has tolerated it well.  No breakthrough seizures.  In the past when he was on generic he did have breakthrough seizures.  He is currently retired.  Was diagnosed with prostate cancer last year and has undergone 28 sessions of radiation.  He continues to operate a motor vehicle.  He returns today for an evaluation.  HISTORY 11/11/19 [name] is a 75 year old male with history of seizures he remains on Tegretol, requires brand-name. No recurrent seizures.  Continues to work full-time, drives a car without difficulty.  Is tolerating the medication well.  Brand-name is expensive, about $600 every 3 months.  Previously, with generic he reports breakthrough seizures.  Has an elevated PSA, seeing urology.  Also shoulder pain, worse on the left, seeing PCP.  We have previously tried to get a financial break from the cost of Tegretol, unsuccessfully.  Presents today for evaluation accompanied by his wife.  REVIEW OF SYSTEMS: Out of a complete 14 system review of symptoms, the patient complains only of the following symptoms, and all other reviewed systems are negative.  ALLERGIES: No Known Allergies  HOME MEDICATIONS: Outpatient Medications Prior to Visit  Medication Sig Dispense Refill   allopurinol (ZYLOPRIM) 100 MG tablet Take 1 tablet (100 mg total) by mouth daily. 90 tablet 3   carbamazepine (TEGRETOL) 200 MG tablet Take 2 tablets (400 mg total) by mouth 3 (three) times daily. WILL TRY SWITCH TO GENERIC 540 tablet 3   dutasteride (AVODART) 0.5 MG capsule Take 1 capsule (0.5 mg total) by mouth every evening. 90 capsule 3   HYDROcodone-acetaminophen (NORCO/VICODIN) 5-325 MG tablet Take 1 tablet by  mouth every 6 (six) hours as needed for moderate pain. 30 tablet 0   levothyroxine (SYNTHROID) 88 MCG tablet Take 1 tablet (88 mcg total) by mouth daily before breakfast. 30 tablet 6   meclizine (ANTIVERT) 25 MG tablet Take 1 tablet (25 mg total) by mouth 3 (three) times daily as needed for dizziness. 30 tablet 6   meloxicam (MOBIC) 15 MG tablet Take 15 mg by mouth daily.     OVER THE COUNTER MEDICATION Stool softener with probiotic twice a week.     polyethylene glycol powder (GLYCOLAX) 17 GM/SCOOP powder 1 scoop in 8 oz water at noon and in evening 850 g 6   tamsulosin (FLOMAX) 0.4 MG CAPS capsule Take 2 capsules (0.8 mg total) by mouth daily after supper. 180 capsule 3   baclofen (LIORESAL) 10 MG tablet Take 10 mg by mouth 2 (two) times daily. (Patient not taking: No sig reported)     senna (SENOKOT) 8.6 MG tablet Take 2 tablets (17.2 mg total) by mouth at bedtime as needed for constipation (Constipation). 60 tablet 6   No facility-administered medications prior to visit.    PAST MEDICAL HISTORY: Past Medical History:  Diagnosis Date   Cervical radiculopathy    Colon polyp    (2006- adenoma and tubulovillous adenoma; 2010-adenomas; repeat 201)-Dr. Wynetta Emery   Complex partial seizure disorder ([place]) 10/08/2014   GERD (gastroesophageal reflux disease)    Gout    Hypercholesterolemia    Lateral epicondylitis    2002   Migraine    headaches   Ophthalmalgia  Dr. Gershon Crane   Orthodontics    Dr. Junius Roads   Partial epilepsy with impairment of consciousness ([place])    Plantar fasciitis    Prostate cancer ([place])    Seizure disorder ([place])    Dr. Sabra Heck ([facility])   Seizures ([place])    UTI (urinary tract infection)    Vertebrobasilar insufficiency    Vertigo     PAST SURGICAL HISTORY: Past Surgical History:  Procedure Laterality Date   BACK SURGERY     HEMORROIDECTOMY     NECK SURGERY     PROSTATE BIOPSY      FAMILY HISTORY: Family History  Problem Relation Age of Onset   Alcohol abuse  Father    COPD Sister    Lung cancer Sister    Cancer Sister    Lung cancer Brother    Cancer Brother    Cancer Sister        lung   Lung cancer Sister    Lung cancer Brother    Cancer Brother    Seizures Neg Hx    Colon cancer Neg Hx    Esophageal cancer Neg Hx    Rectal cancer Neg Hx    Stomach cancer Neg Hx    Breast cancer Neg Hx    Prostate cancer Neg Hx    Pancreatic cancer Neg Hx     SOCIAL HISTORY: Social History   Socioeconomic History   Marital status: Married    Spouse name: Kizzie Furnish   Number of children: 2   Years of education: 13   Highest education level: Not on file  Occupational History   Occupation: Therapist, nutritional    Comment: [employer], early years/pre Appartments  Tobacco Use   Smoking status: Former    Packs/day: 3.00    Years: 20.00    Pack years: 60.00    Types: Cigarettes    Quit date: 04/02/1986    Years since quitting: 34.7   Smokeless tobacco: Never   Tobacco comments:    Quit 1988, was a heavy smoker (20-30/day)  Vaping Use   Vaping Use: Never used  Substance and Sexual Activity   Alcohol use: No   Drug use: No   Sexual activity: Not Currently  Other Topics Concern   Not on file  Social History Narrative   Occasional glass of caffeine   Patient is right handed.   Lives at home with his wife   They have 6 children together and 15 grandchildren   Social Determinants of Radio broadcast assistant Strain: Not on file  Food Insecurity: Not on file  Transportation Needs: Not on file  Physical Activity: Not on file  Stress: Not on file  Social Connections: Not on file  Intimate Partner Violence: Not on file      PHYSICAL EXAM  Vitals:   12/06/20 1012  BP: 137/88  Pulse: 63  Weight: 248 lb 3.2 oz (112.6 kg)  Height: '5\' 9"'$  (1.753 m)   Body mass index is 36.65 kg/m.  Generalized: Well developed, in no acute distress   Neurological examination  Mentation: Alert oriented to time, place, history taking. Follows all  commands speech and language fluent Cranial nerve II-XII: Pupils were equal round reactive to light. Extraocular movements were full, visual field were full on confrontational test. Facial sensation and strength were normal. Uvula tongue midline. Head turning and shoulder shrug  were normal and symmetric. Motor: The motor testing reveals 5 over 5 strength of all 4 extremities. Good symmetric motor tone is  noted throughout.  Sensory: Sensory testing is intact to soft touch on all 4 extremities. No evidence of extinction is noted.  Coordination: Cerebellar testing reveals good finger-nose-finger and heel-to-shin bilaterally.  Gait and station: Gait is normal. Tandem gait is normal. Romberg is negative. No drift is seen.  Reflexes: Deep tendon reflexes are symmetric and normal bilaterally.   DIAGNOSTIC DATA (LABS, IMAGING, TESTING) - I reviewed patient records, labs, notes, testing and imaging myself where available.  Lab Results  Component Value Date   WBC 4.5 04/13/2020   HGB 15.6 04/13/2020   HCT 45.1 04/13/2020   MCV 90.2 04/13/2020   PLT 192 04/13/2020      Component Value Date/Time   NA 140 06/30/2020 0858   NA 142 11/11/2019 0825   K 4.2 06/30/2020 0858   CL 104 06/30/2020 0858   CO2 25 06/30/2020 0858   GLUCOSE 102 (H) 06/30/2020 0858   BUN 17 06/30/2020 0858   BUN 24 11/11/2019 0825   CREATININE 1.04 06/30/2020 0858   CALCIUM 9.1 06/30/2020 0858   PROT 6.7 04/13/2020 1602   PROT 6.5 11/11/2019 0825   ALBUMIN 3.9 11/11/2019 0825   AST 12 04/13/2020 1602   ALT 12 04/13/2020 1602   ALKPHOS 89 11/11/2019 0825   BILITOT 0.3 04/13/2020 1602   BILITOT 0.3 11/11/2019 0825   GFRNONAA 72 11/11/2019 0825   GFRAA 83 11/11/2019 0825   Lab Results  Component Value Date   CHOL 226 (H) 09/15/2019   HDL 37 (L) 09/15/2019   LDLCALC 150 (H) 09/15/2019   LDLDIRECT 119.0 04/30/2016   TRIG 251 (H) 09/15/2019   CHOLHDL 6.1 (H) 09/15/2019   Lab Results  Component Value Date    HGBA1C 5.0 06/30/2020   No results found for: VITAMINB12 Lab Results  Component Value Date   TSH 6.20 (H) 08/19/2020      ASSESSMENT AND PLAN 75 y.o. year old male  has a past medical history of Cervical radiculopathy, Colon polyp, Complex partial seizure disorder ([place]) (10/08/2014), GERD (gastroesophageal reflux disease), Gout, Hypercholesterolemia, Lateral epicondylitis, Migraine, Ophthalmalgia, Orthodontics, Partial epilepsy with impairment of consciousness ([place]), Plantar fasciitis, Prostate cancer ([place]), Seizure disorder ([place]), Seizures ([place]), UTI (urinary tract infection), Vertebrobasilar insufficiency, and Vertigo. here with:  1.  Seizures  --Continue carbamazepine 400 mg 3 times a day --Blood work today --Advised if he has any seizure events he should let us know --Follow-up in 1 year or sooner if needed     Ward Givens, MSN, NP-C 12/06/2020, 10:18 AM [facility] [address] [place], [place] [address] [phone]

## 2020-12-06 NOTE — Progress Notes (Signed)
I have read the note, and I agree with the clinical assessment and plan.  Charvis Lightner K Melaya [place]   

## 2020-12-07 LAB — COMPREHENSIVE METABOLIC PANEL
ALT: 12 IU/L (ref 0–44)
AST: 13 IU/L (ref 0–40)
Albumin/Globulin Ratio: 1.6 (ref 1.2–2.2)
Albumin: 4.2 g/dL (ref 3.7–4.7)
Alkaline Phosphatase: 98 IU/L (ref 44–121)
BUN/Creatinine Ratio: 15 (ref 10–24)
BUN: 13 mg/dL (ref 8–27)
Bilirubin Total: 0.3 mg/dL (ref 0.0–1.2)
CO2: 23 mmol/L (ref 20–29)
Calcium: 8.9 mg/dL (ref 8.6–10.2)
Chloride: 101 mmol/L (ref 96–106)
Creatinine, Ser: 0.88 mg/dL (ref 0.76–1.27)
Globulin, Total: 2.7 g/dL (ref 1.5–4.5)
Glucose: 113 mg/dL — ABNORMAL HIGH (ref 65–99)
Potassium: 4.4 mmol/L (ref 3.5–5.2)
Sodium: 139 mmol/L (ref 134–144)
Total Protein: 6.9 g/dL (ref 6.0–8.5)
eGFR: 90 mL/min/{1.73_m2} (ref >59)

## 2020-12-07 LAB — CBC WITH DIFFERENTIAL/PLATELET
Basophils Absolute: 0.1 10*3/uL (ref 0.0–0.2)
Basos: 1 %
EOS (ABSOLUTE): 0.2 10*3/uL (ref 0.0–0.4)
Eos: 3 %
Hematocrit: 45.9 % (ref 37.5–51.0)
Hemoglobin: 15.6 g/dL (ref 13.0–17.7)
Immature Grans (Abs): 0 10*3/uL (ref 0.0–0.1)
Immature Granulocytes: 1 %
Lymphocytes Absolute: 1.3 10*3/uL (ref 0.7–3.1)
Lymphs: 24 %
MCH: 31.7 pg (ref 26.6–33.0)
MCHC: 34 g/dL (ref 31.5–35.7)
MCV: 93 fL (ref 79–97)
Monocytes Absolute: 0.7 10*3/uL (ref 0.1–0.9)
Monocytes: 12 %
Neutrophils Absolute: 3.3 10*3/uL (ref 1.4–7.0)
Neutrophils: 59 %
Platelets: 209 10*3/uL (ref 150–450)
RBC: 4.92 x10E6/uL (ref 4.14–5.80)
RDW: 12.8 % (ref 11.6–15.4)
WBC: 5.5 10*3/uL (ref 3.4–10.8)

## 2020-12-07 LAB — CARBAMAZEPINE LEVEL, TOTAL: Carbamazepine (Tegretol), S: 13.7 ug/mL (ref 4.0–12.0)

## 2021-01-03 ENCOUNTER — Telehealth: Payer: Self-pay | Admitting: Family Medicine

## 2021-01-03 NOTE — Telephone Encounter (Signed)
[facility] requesting copy of records. I emailed.

## 2021-01-09 DIAGNOSIS — C61 Malignant neoplasm of prostate: Secondary | ICD-10-CM | POA: Diagnosis not present

## 2021-01-12 DIAGNOSIS — H8111 Benign paroxysmal vertigo, right ear: Secondary | ICD-10-CM | POA: Diagnosis not present

## 2021-01-12 DIAGNOSIS — R42 Dizziness and giddiness: Secondary | ICD-10-CM | POA: Diagnosis not present

## 2021-01-16 DIAGNOSIS — R3915 Urgency of urination: Secondary | ICD-10-CM | POA: Diagnosis not present

## 2021-01-16 DIAGNOSIS — N3941 Urge incontinence: Secondary | ICD-10-CM | POA: Diagnosis not present

## 2021-01-16 DIAGNOSIS — C61 Malignant neoplasm of prostate: Secondary | ICD-10-CM | POA: Diagnosis not present

## 2021-01-16 DIAGNOSIS — N5201 Erectile dysfunction due to arterial insufficiency: Secondary | ICD-10-CM | POA: Diagnosis not present

## 2021-01-18 DIAGNOSIS — H8111 Benign paroxysmal vertigo, right ear: Secondary | ICD-10-CM | POA: Diagnosis not present

## 2021-01-18 DIAGNOSIS — R42 Dizziness and giddiness: Secondary | ICD-10-CM | POA: Diagnosis not present

## 2021-04-20 DIAGNOSIS — K219 Gastro-esophageal reflux disease without esophagitis: Secondary | ICD-10-CM | POA: Diagnosis not present

## 2021-04-20 DIAGNOSIS — R7982 Elevated C-reactive protein (CRP): Secondary | ICD-10-CM | POA: Diagnosis not present

## 2021-04-20 DIAGNOSIS — E119 Type 2 diabetes mellitus without complications: Secondary | ICD-10-CM | POA: Diagnosis not present

## 2021-04-20 DIAGNOSIS — E559 Vitamin D deficiency, unspecified: Secondary | ICD-10-CM | POA: Diagnosis not present

## 2021-04-20 DIAGNOSIS — E782 Mixed hyperlipidemia: Secondary | ICD-10-CM | POA: Diagnosis not present

## 2021-04-20 DIAGNOSIS — M1000 Idiopathic gout, unspecified site: Secondary | ICD-10-CM | POA: Diagnosis not present

## 2021-04-20 DIAGNOSIS — I7090 Unspecified atherosclerosis: Secondary | ICD-10-CM | POA: Diagnosis not present

## 2021-04-20 DIAGNOSIS — C61 Malignant neoplasm of prostate: Secondary | ICD-10-CM | POA: Diagnosis not present

## 2021-05-01 DIAGNOSIS — B351 Tinea unguium: Secondary | ICD-10-CM | POA: Diagnosis not present

## 2021-05-01 DIAGNOSIS — M792 Neuralgia and neuritis, unspecified: Secondary | ICD-10-CM | POA: Diagnosis not present

## 2021-05-01 DIAGNOSIS — G609 Hereditary and idiopathic neuropathy, unspecified: Secondary | ICD-10-CM | POA: Diagnosis not present

## 2021-05-01 DIAGNOSIS — L603 Nail dystrophy: Secondary | ICD-10-CM | POA: Diagnosis not present

## 2021-05-19 DIAGNOSIS — R350 Frequency of micturition: Secondary | ICD-10-CM | POA: Diagnosis not present

## 2021-05-19 DIAGNOSIS — N3941 Urge incontinence: Secondary | ICD-10-CM | POA: Diagnosis not present

## 2021-05-19 DIAGNOSIS — N5201 Erectile dysfunction due to arterial insufficiency: Secondary | ICD-10-CM | POA: Diagnosis not present

## 2021-05-19 DIAGNOSIS — C61 Malignant neoplasm of prostate: Secondary | ICD-10-CM | POA: Diagnosis not present

## 2021-07-12 DIAGNOSIS — H02834 Dermatochalasis of left upper eyelid: Secondary | ICD-10-CM | POA: Diagnosis not present

## 2021-07-12 DIAGNOSIS — H02831 Dermatochalasis of right upper eyelid: Secondary | ICD-10-CM | POA: Diagnosis not present

## 2021-07-12 DIAGNOSIS — H25813 Combined forms of age-related cataract, bilateral: Secondary | ICD-10-CM | POA: Diagnosis not present

## 2021-07-12 DIAGNOSIS — H4322 Crystalline deposits in vitreous body, left eye: Secondary | ICD-10-CM | POA: Diagnosis not present

## 2021-07-28 DIAGNOSIS — L603 Nail dystrophy: Secondary | ICD-10-CM | POA: Diagnosis not present

## 2021-07-28 DIAGNOSIS — M792 Neuralgia and neuritis, unspecified: Secondary | ICD-10-CM | POA: Diagnosis not present

## 2021-07-28 DIAGNOSIS — G609 Hereditary and idiopathic neuropathy, unspecified: Secondary | ICD-10-CM | POA: Diagnosis not present

## 2021-07-28 DIAGNOSIS — B351 Tinea unguium: Secondary | ICD-10-CM | POA: Diagnosis not present

## 2021-08-01 DIAGNOSIS — C44629 Squamous cell carcinoma of skin of left upper limb, including shoulder: Secondary | ICD-10-CM | POA: Diagnosis not present

## 2021-08-01 DIAGNOSIS — L918 Other hypertrophic disorders of the skin: Secondary | ICD-10-CM | POA: Diagnosis not present

## 2021-08-01 DIAGNOSIS — L821 Other seborrheic keratosis: Secondary | ICD-10-CM | POA: Diagnosis not present

## 2021-08-01 DIAGNOSIS — D485 Neoplasm of uncertain behavior of skin: Secondary | ICD-10-CM | POA: Diagnosis not present

## 2021-08-07 DIAGNOSIS — C61 Malignant neoplasm of prostate: Secondary | ICD-10-CM | POA: Diagnosis not present

## 2021-08-16 DIAGNOSIS — C61 Malignant neoplasm of prostate: Secondary | ICD-10-CM | POA: Diagnosis not present

## 2021-08-16 DIAGNOSIS — R350 Frequency of micturition: Secondary | ICD-10-CM | POA: Diagnosis not present

## 2021-08-16 DIAGNOSIS — N3941 Urge incontinence: Secondary | ICD-10-CM | POA: Diagnosis not present

## 2021-09-07 DIAGNOSIS — C44629 Squamous cell carcinoma of skin of left upper limb, including shoulder: Secondary | ICD-10-CM | POA: Diagnosis not present

## 2021-09-21 DIAGNOSIS — N3941 Urge incontinence: Secondary | ICD-10-CM | POA: Diagnosis not present

## 2021-09-21 DIAGNOSIS — M6281 Muscle weakness (generalized): Secondary | ICD-10-CM | POA: Diagnosis not present

## 2021-09-21 DIAGNOSIS — M6289 Other specified disorders of muscle: Secondary | ICD-10-CM | POA: Diagnosis not present

## 2021-09-27 ENCOUNTER — Emergency Department (HOSPITAL_BASED_OUTPATIENT_CLINIC_OR_DEPARTMENT_OTHER): Payer: Medicare Other

## 2021-09-27 ENCOUNTER — Emergency Department (HOSPITAL_BASED_OUTPATIENT_CLINIC_OR_DEPARTMENT_OTHER)
Admission: EM | Admit: 2021-09-27 | Discharge: 2021-09-27 | Disposition: A | Discharge status: Home / Self Care | Payer: Medicare Other | Attending: Emergency Medicine | Admitting: Emergency Medicine

## 2021-09-27 ENCOUNTER — Telehealth: Payer: Self-pay | Admitting: Adult Health

## 2021-09-27 ENCOUNTER — Encounter (HOSPITAL_BASED_OUTPATIENT_CLINIC_OR_DEPARTMENT_OTHER): Payer: Self-pay

## 2021-09-27 ENCOUNTER — Other Ambulatory Visit: Payer: Self-pay

## 2021-09-27 DIAGNOSIS — W228XXA Striking against or struck by other objects, initial encounter: Secondary | ICD-10-CM | POA: Diagnosis not present

## 2021-09-27 DIAGNOSIS — Z23 Encounter for immunization: Secondary | ICD-10-CM | POA: Diagnosis not present

## 2021-09-27 DIAGNOSIS — S61211A Laceration without foreign body of left index finger without damage to nail, initial encounter: Secondary | ICD-10-CM | POA: Insufficient documentation

## 2021-09-27 DIAGNOSIS — S6992XA Unspecified injury of left wrist, hand and finger(s), initial encounter: Secondary | ICD-10-CM | POA: Diagnosis present

## 2021-09-27 DIAGNOSIS — S61219A Laceration without foreign body of unspecified finger without damage to nail, initial encounter: Secondary | ICD-10-CM | POA: Diagnosis not present

## 2021-09-27 MED ORDER — BUPIVACAINE HCL (PF) 0.5 % IJ SOLN
10.0000 mL | Freq: Once | INTRAMUSCULAR | Status: AC
  Administered 2021-09-27: 10 mL
  Filled 2021-09-27: qty 10

## 2021-09-27 MED ORDER — TETANUS-DIPHTH-ACELL PERTUSSIS 5-2.5-18.5 LF-MCG/0.5 IM SUSY
0.5000 mL | PREFILLED_SYRINGE | Freq: Once | INTRAMUSCULAR | Status: AC
  Administered 2021-09-27: 0.5 mL via INTRAMUSCULAR
  Filled 2021-09-27: qty 0.5

## 2021-09-27 MED ORDER — CARBAMAZEPINE 200 MG PO TABS: 400.0000 mg | ORAL_TABLET | Freq: Three times a day (TID) | ORAL | 0 refills | Status: DC

## 2021-09-27 NOTE — Telephone Encounter (Signed)
Pt is requesting a refill for carbamazepine (TEGRETOL) 200 MG tablet .  Pharmacy: [id number]

## 2021-09-27 NOTE — Telephone Encounter (Addendum)
Rx refill (90 day supply) sent to [facility]. Pt has pending appt in 3 months.

## 2021-09-27 NOTE — ED Notes (Signed)
VS

## 2021-09-27 NOTE — ED Triage Notes (Signed)
Patient here POV from Home.  Endorses lacerating Left Proximal Second Digit against a Weed Trimmer accidentally approximately 15 minutes PTA. Approximately 1 cm.   Unknown Tetanus Status. Bleeding Controlled at this Time.  NAD Noted during Triage. A&Ox4. GCS 15. Ambulatory.

## 2021-09-27 NOTE — Discharge Instructions (Signed)
You were seen today for a laceration of the finger which was repaired with 3 sutures.  Please keep the area clean.  Follow-up in 7 days for suture removal.

## 2021-09-27 NOTE — ED Provider Notes (Signed)
[facility] EMERGENCY DEPT Provider Note   CSN: [id number] Arrival date & time: 09/27/21  1128     History  Chief Complaint  Patient presents with   Laceration    Brandon Flores is a 76 y.o. male.  Patient presents to the hospital complaining of pain to the left index finger after hitting it with a hedge trimmer earlier today.  Patient states that he slipped while operating the hedge trimmer and caught his finger.  He believes it went down close to the bone.  Patient is able to move his finger has brisk cap refill distal to the injury.  Injury wrapped with bleeding controlled upon arrival to the emergency department.  Patient is not on blood thinners.  Patient unclear as to the timing of his last tetanus booster  HPI     Home Medications Prior to Admission medications   Medication Sig Start Date End Date Taking? Authorizing Provider  allopurinol (ZYLOPRIM) 100 MG tablet Take 1 tablet (100 mg total) by mouth daily. 08/19/20   Hilts, Legrand Como, MD  baclofen (LIORESAL) 10 MG tablet Take 10 mg by mouth 2 (two) times daily. Patient not taking: No sig reported    [provider]  carbamazepine (TEGRETOL) 200 MG tablet Take 2 tablets (400 mg total) by mouth 3 (three) times daily. WILL TRY SWITCH TO GENERIC 09/27/21   Ward Givens, NP  dutasteride (AVODART) 0.5 MG capsule Take 1 capsule (0.5 mg total) by mouth every evening. 08/19/20   Hilts, Legrand Como, MD  HYDROcodone-acetaminophen (NORCO/VICODIN) 5-325 MG tablet Take 1 tablet by mouth every 6 (six) hours as needed for moderate pain. 08/19/20   Hilts, Legrand Como, MD  levothyroxine (SYNTHROID) 88 MCG tablet Take 1 tablet (88 mcg total) by mouth daily before breakfast. 08/22/20   Hilts, Legrand Como, MD  meclizine (ANTIVERT) 25 MG tablet Take 1 tablet (25 mg total) by mouth 3 (three) times daily as needed for dizziness. 06/08/19   Hilts, Legrand Como, MD  meloxicam (MOBIC) 15 MG tablet Take 15 mg by mouth daily. 06/19/19   [provider]  OVER THE COUNTER MEDICATION Stool softener with probiotic twice a week.    [provider]  polyethylene glycol powder (GLYCOLAX) 17 GM/SCOOP powder 1 scoop in 8 oz water at noon and in evening 09/12/20   Hilts, Michael, MD  tamsulosin (FLOMAX) 0.4 MG CAPS capsule Take 2 capsules (0.8 mg total) by mouth daily after supper. 08/19/20   Hilts, Legrand Como, MD      Allergies    Patient has no known allergies.    Review of Systems   Review of Systems  Musculoskeletal:  Positive for arthralgias.  Skin:  Positive for wound.    Physical Exam Updated Vital Signs BP 138/76 (BP Location: Right Arm)   Pulse 73   Temp 98.4 F (36.9 C) (Oral)   Resp 18   Ht '5\' 9"'$  (1.753 m)   Wt 112.6 kg   SpO2 96%   BMI 36.66 kg/m  Physical Exam Vitals and nursing note reviewed.  Constitutional:      General: He is not in acute distress. HENT:     Head: Normocephalic.  Eyes:     Conjunctiva/sclera: Conjunctivae normal.  Cardiovascular:     Rate and Rhythm: Normal rate.  Pulmonary:     Effort: Pulmonary effort is normal.  Musculoskeletal:     Cervical back: Normal range of motion.  Skin:    Comments: 1cm wound noted on dorsal portion of left index finger  Neurological:     Mental Status: He is alert.     ED Results / Procedures / Treatments   Labs (all labs ordered are listed, but only abnormal results are displayed) Labs Reviewed - No data to display  EKG None  Radiology DG Finger Index Left  Result Date: 09/27/2021 CLINICAL DATA:  Accidental injury with laceration, initial encounter. EXAM: LEFT INDEX FINGER 2+V COMPARISON:  None Available. FINDINGS: No acute osseous or joint abnormality. No radiopaque foreign body. Small laceration is seen along the lateral soft tissues of the middle phalanx. Degenerative changes in the distal interphalangeal joint. IMPRESSION: Soft tissue injury without radiopaque foreign body or acute osseous abnormality. Electronically Signed   By:  Lorin Picket M.D.   On: 09/27/2021 13:25    Procedures .Marland KitchenLaceration Repair  Date/Time: 09/27/2021 3:00 PM  Performed by: Dorothyann Peng, PA-C Authorized by: Dorothyann Peng, PA-C   Consent:    Consent obtained:  Verbal   Consent given by:  Patient   Risks discussed:  Infection, need for additional repair, pain, poor cosmetic result and poor wound healing   Alternatives discussed:  No treatment and delayed treatment Universal protocol:    Procedure explained and questions answered to patient or proxy's satisfaction: yes     Relevant documents present and verified: yes     Imaging studies available: yes     Required blood products, implants, devices, and special equipment available: yes     Site/side marked: yes     Immediately prior to procedure, a time out was called: yes     Patient identity confirmed:  Verbally with patient Anesthesia:    Anesthesia method:  Local infiltration   Local anesthetic:  Bupivacaine 0.5% w/o epi Laceration details:    Location:  Finger   Finger location:  L index finger   Length (cm):  1   Depth (mm):  7 Pre-procedure details:    Preparation:  Patient was prepped and draped in usual sterile fashion Exploration:    Hemostasis achieved with:  Direct pressure Treatment:    Area cleansed with:  Saline   Amount of cleaning:  Standard   Irrigation solution:  Sterile saline   Irrigation volume:  100   Irrigation method:  Pressure wash   Debridement:  None Skin repair:    Repair method:  Sutures   Suture size:  5-0   Suture material:  Prolene   Suture technique:  Simple interrupted   Number of sutures:  3 Approximation:    Approximation:  Close Repair type:    Repair type:  Simple Post-procedure details:    Dressing:  Non-adherent dressing   Procedure completion:  Tolerated well, no immediate complications     Medications Ordered in ED Medications  bupivacaine(PF) (MARCAINE) 0.5 % injection 10 mL (10 mLs Infiltration Given by  Other 09/27/21 1354)  Tdap (BOOSTRIX) injection 0.5 mL (0.5 mLs Intramuscular Given 09/27/21 1354)    ED Course/ Medical Decision Making/ A&P Clinical Course as of 09/27/21 1505  Wed Sep 27, 6341  2657 76 year old male here for laceration on his left index finger from a hedge tremor.  Neurovascular intact.  Imaging without fracture.  Wound care and follow-up PCP. [MB]    Clinical Course User Index [MB] Hayden Rasmussen, MD                           Medical Decision Making Amount and/or Complexity of Data Reviewed Radiology: ordered.  Risk Prescription drug management.   Patient presents with a chief concern of laceration to left index finger.  I ordered imaging and interpreted the same to evaluate for possible fracture.  Images included plain films of the left index finger which show no fracture or osseous abnormality.  I agree with radiologist findings.  I repaired the patient's finger laceration as noted above with 3 simple interrupted sutures.  The patient tolerated the procedure well.  I ordered the patient a tetanus shot since his last tetanus immunization appears to be over 10 years ago.  The patient will discharge home with wound care instructions.  Patient to follow-up in 7 to 10 days for suture removal.        Final Clinical Impression(s) / ED Diagnoses Final diagnoses:  Laceration of left index finger without foreign body without damage to nail, initial encounter    Rx / DC Orders ED Discharge Orders     None         Ronny Bacon 09/27/21 1505    Hayden Rasmussen, MD 09/27/21 [phone]

## 2021-10-10 DIAGNOSIS — N3941 Urge incontinence: Secondary | ICD-10-CM | POA: Diagnosis not present

## 2021-10-10 DIAGNOSIS — R3912 Poor urinary stream: Secondary | ICD-10-CM | POA: Diagnosis not present

## 2021-10-10 DIAGNOSIS — M6281 Muscle weakness (generalized): Secondary | ICD-10-CM | POA: Diagnosis not present

## 2021-10-10 DIAGNOSIS — R350 Frequency of micturition: Secondary | ICD-10-CM | POA: Diagnosis not present

## 2021-10-10 DIAGNOSIS — M6289 Other specified disorders of muscle: Secondary | ICD-10-CM | POA: Diagnosis not present

## 2021-10-26 DIAGNOSIS — N3941 Urge incontinence: Secondary | ICD-10-CM | POA: Diagnosis not present

## 2021-10-26 DIAGNOSIS — M6289 Other specified disorders of muscle: Secondary | ICD-10-CM | POA: Diagnosis not present

## 2021-10-26 DIAGNOSIS — M6281 Muscle weakness (generalized): Secondary | ICD-10-CM | POA: Diagnosis not present

## 2021-10-26 DIAGNOSIS — R3912 Poor urinary stream: Secondary | ICD-10-CM | POA: Diagnosis not present

## 2021-10-26 DIAGNOSIS — R350 Frequency of micturition: Secondary | ICD-10-CM | POA: Diagnosis not present

## 2021-10-27 DIAGNOSIS — G609 Hereditary and idiopathic neuropathy, unspecified: Secondary | ICD-10-CM | POA: Diagnosis not present

## 2021-10-27 DIAGNOSIS — L603 Nail dystrophy: Secondary | ICD-10-CM | POA: Diagnosis not present

## 2021-10-27 DIAGNOSIS — B351 Tinea unguium: Secondary | ICD-10-CM | POA: Diagnosis not present

## 2021-10-27 DIAGNOSIS — M792 Neuralgia and neuritis, unspecified: Secondary | ICD-10-CM | POA: Diagnosis not present

## 2021-11-09 DIAGNOSIS — M6281 Muscle weakness (generalized): Secondary | ICD-10-CM | POA: Diagnosis not present

## 2021-11-09 DIAGNOSIS — R3912 Poor urinary stream: Secondary | ICD-10-CM | POA: Diagnosis not present

## 2021-11-09 DIAGNOSIS — N3941 Urge incontinence: Secondary | ICD-10-CM | POA: Diagnosis not present

## 2021-11-09 DIAGNOSIS — M6289 Other specified disorders of muscle: Secondary | ICD-10-CM | POA: Diagnosis not present

## 2021-11-09 DIAGNOSIS — R350 Frequency of micturition: Secondary | ICD-10-CM | POA: Diagnosis not present

## 2021-11-21 DIAGNOSIS — M1000 Idiopathic gout, unspecified site: Secondary | ICD-10-CM | POA: Diagnosis not present

## 2021-11-21 DIAGNOSIS — E119 Type 2 diabetes mellitus without complications: Secondary | ICD-10-CM | POA: Diagnosis not present

## 2021-11-21 DIAGNOSIS — R7982 Elevated C-reactive protein (CRP): Secondary | ICD-10-CM | POA: Diagnosis not present

## 2021-11-21 DIAGNOSIS — K219 Gastro-esophageal reflux disease without esophagitis: Secondary | ICD-10-CM | POA: Diagnosis not present

## 2021-11-21 DIAGNOSIS — C61 Malignant neoplasm of prostate: Secondary | ICD-10-CM | POA: Diagnosis not present

## 2021-11-21 DIAGNOSIS — E559 Vitamin D deficiency, unspecified: Secondary | ICD-10-CM | POA: Diagnosis not present

## 2021-12-05 DIAGNOSIS — R3912 Poor urinary stream: Secondary | ICD-10-CM | POA: Diagnosis not present

## 2021-12-05 DIAGNOSIS — R350 Frequency of micturition: Secondary | ICD-10-CM | POA: Diagnosis not present

## 2021-12-05 DIAGNOSIS — M6281 Muscle weakness (generalized): Secondary | ICD-10-CM | POA: Diagnosis not present

## 2021-12-05 DIAGNOSIS — M6289 Other specified disorders of muscle: Secondary | ICD-10-CM | POA: Diagnosis not present

## 2021-12-05 DIAGNOSIS — N3941 Urge incontinence: Secondary | ICD-10-CM | POA: Diagnosis not present

## 2021-12-05 NOTE — Progress Notes (Unsigned)
Patient: Brandon Flores Date of Birth: [birth year]  Reason for Visit: Follow up History from: Patient Primary Neurologist:    ASSESSMENT AND PLAN 76 y.o. year old male   59.  History of seizures, well controlled   HISTORY OF PRESENT ILLNESS: Today 12/05/21 Brandon Flores   HISTORY  12/06/20 MM: Brandon Flores is a 75 year old male with a history of seizures.  He returns today for follow-up.  He is now on carbamazepine and so far has tolerated it well.  No breakthrough seizures.  In the past when he was on generic he did have breakthrough seizures.  He is currently retired.  Was diagnosed with prostate cancer last year and has undergone 28 sessions of radiation.  He continues to operate a motor vehicle.  He returns today for an evaluation.  REVIEW OF SYSTEMS: Out of a complete 14 system review of symptoms, the patient complains only of the following symptoms, and all other reviewed systems are negative.  See HPI  ALLERGIES: No Known Allergies  HOME MEDICATIONS: Outpatient Medications Prior to Visit  Medication Sig Dispense Refill   allopurinol (ZYLOPRIM) 100 MG tablet Take 1 tablet (100 mg total) by mouth daily. 90 tablet 3   baclofen (LIORESAL) 10 MG tablet Take 10 mg by mouth 2 (two) times daily. (Patient not taking: No sig reported)     carbamazepine (TEGRETOL) 200 MG tablet Take 2 tablets (400 mg total) by mouth 3 (three) times daily. WILL TRY SWITCH TO GENERIC 540 tablet 0   dutasteride (AVODART) 0.5 MG capsule Take 1 capsule (0.5 mg total) by mouth every evening. 90 capsule 3   HYDROcodone-acetaminophen (NORCO/VICODIN) 5-325 MG tablet Take 1 tablet by mouth every 6 (six) hours as needed for moderate pain. 30 tablet 0   levothyroxine (SYNTHROID) 88 MCG tablet Take 1 tablet (88 mcg total) by mouth daily before breakfast. 30 tablet 6   meclizine (ANTIVERT) 25 MG tablet Take 1 tablet (25 mg total) by mouth 3 (three) times daily as needed for dizziness. 30 tablet 6   meloxicam (MOBIC) 15  MG tablet Take 15 mg by mouth daily.     OVER THE COUNTER MEDICATION Stool softener with probiotic twice a week.     polyethylene glycol powder (GLYCOLAX) 17 GM/SCOOP powder 1 scoop in 8 oz water at noon and in evening 850 g 6   tamsulosin (FLOMAX) 0.4 MG CAPS capsule Take 2 capsules (0.8 mg total) by mouth daily after supper. 180 capsule 3   No facility-administered medications prior to visit.    PAST MEDICAL HISTORY: Past Medical History:  Diagnosis Date   Cervical radiculopathy    Colon polyp    (2006- adenoma and tubulovillous adenoma; 2010-adenomas; repeat 201)-Dr. Wynetta Emery   Complex partial seizure disorder ([place]) 10/08/2014   GERD (gastroesophageal reflux disease)    Gout    Hypercholesterolemia    Lateral epicondylitis    2002   Migraine    headaches   Ophthalmalgia    Dr. Gershon Crane   Orthodontics    Dr. Junius Roads   Partial epilepsy with impairment of consciousness ([place])    Plantar fasciitis    Prostate cancer ([place])    Seizure disorder ([place])    Dr. Sabra Heck ([facility])   Seizures [facility])    UTI (urinary tract infection)    Vertebrobasilar insufficiency    Vertigo     PAST SURGICAL HISTORY: Past Surgical History:  Procedure Laterality Date   BACK SURGERY     HEMORROIDECTOMY  NECK SURGERY     PROSTATE BIOPSY      FAMILY HISTORY: Family History  Problem Relation Age of Onset   Alcohol abuse Father    COPD Sister    Lung cancer Sister    Cancer Sister    Lung cancer Brother    Cancer Brother    Cancer Sister        lung   Lung cancer Sister    Lung cancer Brother    Cancer Brother    Seizures Neg Hx    Colon cancer Neg Hx    Esophageal cancer Neg Hx    Rectal cancer Neg Hx    Stomach cancer Neg Hx    Breast cancer Neg Hx    Prostate cancer Neg Hx    Pancreatic cancer Neg Hx     SOCIAL HISTORY: Social History   Socioeconomic History   Marital status: Married    Spouse name: Kizzie Furnish   Number of children: 2   Years of education: 13   Highest  education level: Not on file  Occupational History   Occupation: Therapist, nutritional    Comment: [employer]  Tobacco Use   Smoking status: Former    Packs/day: 3.00    Years: 20.00    Total pack years: 60.00    Types: Cigarettes    Quit date: 04/02/1986    Years since quitting: 35.7   Smokeless tobacco: Never   Tobacco comments:    Quit 1988, was a heavy smoker (20-30/day)  Vaping Use   Vaping Use: Never used  Substance and Sexual Activity   Alcohol use: No   Drug use: No   Sexual activity: Not Currently  Other Topics Concern   Not on file  Social History Narrative   Occasional glass of caffeine   Patient is right handed.   Lives at home with his wife   They have 6 children together and 15 grandchildren   Social Determinants of Health   Financial Resource Strain: Not on file  Food Insecurity: Not on file  Transportation Needs: Not on file  Physical Activity: Not on file  Stress: Not on file  Social Connections: Not on file  Intimate Partner Violence: Not on file    PHYSICAL EXAM  There were no vitals filed for this visit. There is no height or weight on file to calculate BMI.  Generalized: Well developed, in no acute distress  Neurological examination  Mentation: Alert oriented to time, place, history taking. Follows all commands speech and language fluent Cranial nerve II-XII: Pupils were equal round reactive to light. Extraocular movements were full, visual field were full on confrontational test. Facial sensation and strength were normal. Uvula tongue midline. Head turning and shoulder shrug  were normal and symmetric. Motor: The motor testing reveals 5 over 5 strength of all 4 extremities. Good symmetric motor tone is noted throughout.  Sensory: Sensory testing is intact to soft touch on all 4 extremities. No evidence of extinction is noted.  Coordination: Cerebellar testing reveals good finger-nose-finger and heel-to-shin bilaterally.  Gait and station:  Gait is normal. Tandem gait is normal. Romberg is negative. No drift is seen.  Reflexes: Deep tendon reflexes are symmetric and normal bilaterally.   DIAGNOSTIC DATA (LABS, IMAGING, TESTING) - I reviewed patient records, labs, notes, testing and imaging myself where available.  Lab Results  Component Value Date   WBC 5.5 12/06/2020   HGB 15.6 12/06/2020   HCT 45.9 12/06/2020   MCV 93  12/06/2020   PLT 209 12/06/2020      Component Value Date/Time   NA 139 12/06/2020 1110   K 4.4 12/06/2020 1110   CL 101 12/06/2020 1110   CO2 23 12/06/2020 1110   GLUCOSE 113 (H) 12/06/2020 1110   GLUCOSE 102 (H) 06/30/2020 0858   BUN 13 12/06/2020 1110   CREATININE 0.88 12/06/2020 1110   CREATININE 1.04 06/30/2020 0858   CALCIUM 8.9 12/06/2020 1110   PROT 6.9 12/06/2020 1110   ALBUMIN 4.2 12/06/2020 1110   AST 13 12/06/2020 1110   ALT 12 12/06/2020 1110   ALKPHOS 98 12/06/2020 1110   BILITOT 0.3 12/06/2020 1110   GFRNONAA 72 11/11/2019 0825   GFRAA 83 11/11/2019 0825   Lab Results  Component Value Date   CHOL 226 (H) 09/15/2019   HDL 37 (L) 09/15/2019   LDLCALC 150 (H) 09/15/2019   LDLDIRECT 119.0 04/30/2016   TRIG 251 (H) 09/15/2019   CHOLHDL 6.1 (H) 09/15/2019   Lab Results  Component Value Date   HGBA1C 5.0 06/30/2020   No results found for: "VITAMINB12" Lab Results  Component Value Date   TSH 6.20 (H) 08/19/2020    Butler Denmark, AGNP-C, DNP 12/05/2021, 3:30 PM [facility] [address] [place], [place] [address] [phone]

## 2021-12-06 ENCOUNTER — Ambulatory Visit (INDEPENDENT_AMBULATORY_CARE_PROVIDER_SITE_OTHER): Payer: Medicare Other | Admitting: Neurology

## 2021-12-06 ENCOUNTER — Encounter: Payer: Self-pay | Admitting: Neurology

## 2021-12-06 VITALS — BP 147/77 | HR 62 | Ht 69.0 in | Wt 240.0 lb

## 2021-12-06 DIAGNOSIS — G40309 Generalized idiopathic epilepsy and epileptic syndromes, not intractable, without status epilepticus: Secondary | ICD-10-CM | POA: Diagnosis not present

## 2021-12-06 DIAGNOSIS — E559 Vitamin D deficiency, unspecified: Secondary | ICD-10-CM

## 2021-12-06 DIAGNOSIS — T4275XA Adverse effect of unspecified antiepileptic and sedative-hypnotic drugs, initial encounter: Secondary | ICD-10-CM

## 2021-12-06 DIAGNOSIS — G9389 Other specified disorders of brain: Secondary | ICD-10-CM

## 2021-12-06 NOTE — Patient Instructions (Addendum)
Check labs, check eeg, for now continue current dose of carbamazepine

## 2021-12-07 ENCOUNTER — Telehealth: Payer: Self-pay | Admitting: Neurology

## 2021-12-07 LAB — COMPREHENSIVE METABOLIC PANEL
ALT: 10 IU/L (ref 0–44)
AST: 14 IU/L (ref 0–40)
Albumin/Globulin Ratio: 1.5 (ref 1.2–2.2)
Albumin: 3.9 g/dL (ref 3.8–4.8)
Alkaline Phosphatase: 90 IU/L (ref 44–121)
BUN/Creatinine Ratio: 17 (ref 10–24)
BUN: 16 mg/dL (ref 8–27)
Bilirubin Total: 0.3 mg/dL (ref 0.0–1.2)
CO2: 23 mmol/L (ref 20–29)
Calcium: 8.9 mg/dL (ref 8.6–10.2)
Chloride: 103 mmol/L (ref 96–106)
Creatinine, Ser: 0.93 mg/dL (ref 0.76–1.27)
Globulin, Total: 2.6 g/dL (ref 1.5–4.5)
Glucose: 100 mg/dL — ABNORMAL HIGH (ref 70–99)
Potassium: 4.5 mmol/L (ref 3.5–5.2)
Sodium: 140 mmol/L (ref 134–144)
Total Protein: 6.5 g/dL (ref 6.0–8.5)
eGFR: 86 mL/min/{1.73_m2} (ref >59)

## 2021-12-07 LAB — CARBAMAZEPINE LEVEL, TOTAL: Carbamazepine (Tegretol), S: 15.2 ug/mL (ref 4.0–12.0)

## 2021-12-07 LAB — VITAMIN D 25 HYDROXY (VIT D DEFICIENCY, FRACTURES): Vit D, 25-Hydroxy: 26 ng/mL — ABNORMAL LOW (ref 30.0–100.0)

## 2021-12-07 LAB — CBC WITH DIFFERENTIAL/PLATELET
Basophils Absolute: 0 10*3/uL (ref 0.0–0.2)
Basos: 1 %
EOS (ABSOLUTE): 0.2 10*3/uL (ref 0.0–0.4)
Eos: 3 %
Hematocrit: 42.2 % (ref 37.5–51.0)
Hemoglobin: 14.3 g/dL (ref 13.0–17.7)
Immature Grans (Abs): 0 10*3/uL (ref 0.0–0.1)
Immature Granulocytes: 0 %
Lymphocytes Absolute: 1.3 10*3/uL (ref 0.7–3.1)
Lymphs: 25 %
MCH: 31.9 pg (ref 26.6–33.0)
MCHC: 33.9 g/dL (ref 31.5–35.7)
MCV: 94 fL (ref 79–97)
Monocytes Absolute: 0.5 10*3/uL (ref 0.1–0.9)
Monocytes: 10 %
Neutrophils Absolute: 3.3 10*3/uL (ref 1.4–7.0)
Neutrophils: 61 %
Platelets: 206 10*3/uL (ref 150–450)
RBC: 4.48 x10E6/uL (ref 4.14–5.80)
RDW: 12.5 % (ref 11.6–15.4)
WBC: 5.3 10*3/uL (ref 3.4–10.8)

## 2021-12-07 NOTE — Telephone Encounter (Signed)
Please call the patient, carbamazepine level came back elevated at 15.2. I would like for him to repeat this as trough level, come 1st thing in AM before taking his morning dose. There were no signs of toxicity on exam. If he wants to come today, he should come before it is time for his 2nd dose. Vitamin D level is slightly low, needs to restart supplement, 800 units daily. Rest of blood work is okay.

## 2021-12-07 NOTE — Telephone Encounter (Signed)
I spoke with the patient and informed him of the results. He stated he has already taken his second dose of carbamazepine for the day. He was agreeable to coming in next week for repeat blood work.  He will restart his Vitamin D supplement.  He verbalized understanding of the findings and expressed appreciation for the call.

## 2021-12-11 ENCOUNTER — Other Ambulatory Visit (INDEPENDENT_AMBULATORY_CARE_PROVIDER_SITE_OTHER): Payer: Self-pay

## 2021-12-11 DIAGNOSIS — Z0289 Encounter for other administrative examinations: Secondary | ICD-10-CM

## 2021-12-12 ENCOUNTER — Other Ambulatory Visit (INDEPENDENT_AMBULATORY_CARE_PROVIDER_SITE_OTHER): Payer: Self-pay

## 2021-12-12 ENCOUNTER — Ambulatory Visit (INDEPENDENT_AMBULATORY_CARE_PROVIDER_SITE_OTHER): Payer: Medicare Other | Admitting: Neurology

## 2021-12-12 ENCOUNTER — Other Ambulatory Visit: Payer: Self-pay

## 2021-12-12 DIAGNOSIS — G40309 Generalized idiopathic epilepsy and epileptic syndromes, not intractable, without status epilepticus: Secondary | ICD-10-CM

## 2021-12-12 DIAGNOSIS — Z0289 Encounter for other administrative examinations: Secondary | ICD-10-CM

## 2021-12-12 NOTE — Procedures (Signed)
    History:  76 year old man with generalized convulsion  EEG classification: Awake and drowsy  Description of the recording: The background rhythms of this recording consists of a fairly well modulated medium amplitude alpha rhythm of 8 Hz that is reactive to eye opening and closure. As the record progresses, the patient appears to remain in the waking state throughout the recording. Photic stimulation was performed, did not show any abnormalities. Hyperventilation was also performed, did not show any abnormalities. Toward the end of the recording, the patient enters the drowsy state with slight symmetric slowing seen. The patient never enters stage II sleep. No abnormal epileptiform discharges seen during this recording. There was no focal slowing. EKG monitor shows no evidence of cardiac rhythm abnormalities with a heart rate of 60.  Abnormality: None   Impression: This is an essentially normal EEG recording in the waking and drowsy state. No evidence of interictal epileptiform discharges seen. A normal EEG does not exclude a diagnosis of epilepsy.    Alric Ran, MD [facility]

## 2021-12-13 ENCOUNTER — Encounter: Payer: Self-pay | Admitting: Gastroenterology

## 2021-12-13 ENCOUNTER — Telehealth: Payer: Self-pay | Admitting: Neurology

## 2021-12-13 LAB — CARBAMAZEPINE LEVEL, TOTAL: Carbamazepine (Tegretol), S: 10.5 ug/mL (ref 4.0–12.0)

## 2021-12-13 MED ORDER — CARBAMAZEPINE 200 MG PO TABS
400.0000 mg | ORAL_TABLET | Freq: Three times a day (TID) | ORAL | 4 refills | Status: DC
Start: 2021-12-13 — End: 2022-06-13

## 2021-12-13 NOTE — Telephone Encounter (Signed)
I called the patient, EEG was essentially normal.  No seizures were seen.  Repeat carbamazepine level was normal at 10.5. Reviewed case with Dr. April Manson, will keep patient on current dose of carbamazepine.  Concerning that the encephalomalacia in the left temporal lobe could be the seizure focus. He was agreeable.   Meds ordered this encounter  Medications   carbamazepine (TEGRETOL) 200 MG tablet    Sig: Take 2 tablets (400 mg total) by mouth 3 (three) times daily. WILL TRY SWITCH TO GENERIC    Dispense:  540 tablet    Refill:  4    IS GOING TO TRY GENERIC, PLEASE GIVE GENERIC

## 2022-01-03 DIAGNOSIS — R3915 Urgency of urination: Secondary | ICD-10-CM | POA: Diagnosis not present

## 2022-01-03 DIAGNOSIS — M6281 Muscle weakness (generalized): Secondary | ICD-10-CM | POA: Diagnosis not present

## 2022-01-03 DIAGNOSIS — N3941 Urge incontinence: Secondary | ICD-10-CM | POA: Diagnosis not present

## 2022-01-03 DIAGNOSIS — M6289 Other specified disorders of muscle: Secondary | ICD-10-CM | POA: Diagnosis not present

## 2022-01-26 DIAGNOSIS — M792 Neuralgia and neuritis, unspecified: Secondary | ICD-10-CM | POA: Diagnosis not present

## 2022-01-26 DIAGNOSIS — L603 Nail dystrophy: Secondary | ICD-10-CM | POA: Diagnosis not present

## 2022-01-26 DIAGNOSIS — G609 Hereditary and idiopathic neuropathy, unspecified: Secondary | ICD-10-CM | POA: Diagnosis not present

## 2022-01-26 DIAGNOSIS — B351 Tinea unguium: Secondary | ICD-10-CM | POA: Diagnosis not present

## 2022-02-07 DIAGNOSIS — C61 Malignant neoplasm of prostate: Secondary | ICD-10-CM | POA: Diagnosis not present

## 2022-02-14 DIAGNOSIS — N3941 Urge incontinence: Secondary | ICD-10-CM | POA: Diagnosis not present

## 2022-02-14 DIAGNOSIS — N5201 Erectile dysfunction due to arterial insufficiency: Secondary | ICD-10-CM | POA: Diagnosis not present

## 2022-02-14 DIAGNOSIS — C61 Malignant neoplasm of prostate: Secondary | ICD-10-CM | POA: Diagnosis not present

## 2022-04-02 DIAGNOSIS — S46012A Strain of muscle(s) and tendon(s) of the rotator cuff of left shoulder, initial encounter: Secondary | ICD-10-CM

## 2022-04-02 HISTORY — DX: Strain of muscle(s) and tendon(s) of the rotator cuff of left shoulder, initial encounter: S46.012A

## 2022-04-11 DIAGNOSIS — B351 Tinea unguium: Secondary | ICD-10-CM | POA: Diagnosis not present

## 2022-04-11 DIAGNOSIS — L603 Nail dystrophy: Secondary | ICD-10-CM | POA: Diagnosis not present

## 2022-04-11 DIAGNOSIS — G609 Hereditary and idiopathic neuropathy, unspecified: Secondary | ICD-10-CM | POA: Diagnosis not present

## 2022-04-11 DIAGNOSIS — M792 Neuralgia and neuritis, unspecified: Secondary | ICD-10-CM | POA: Diagnosis not present

## 2022-06-13 ENCOUNTER — Encounter: Payer: Self-pay | Admitting: Neurology

## 2022-06-13 ENCOUNTER — Telehealth: Payer: Self-pay | Admitting: Neurology

## 2022-06-13 ENCOUNTER — Ambulatory Visit (INDEPENDENT_AMBULATORY_CARE_PROVIDER_SITE_OTHER): Payer: Medicare Other | Admitting: Neurology

## 2022-06-13 VITALS — BP 142/78 | HR 71 | Ht 69.0 in | Wt 240.0 lb

## 2022-06-13 DIAGNOSIS — G40309 Generalized idiopathic epilepsy and epileptic syndromes, not intractable, without status epilepticus: Secondary | ICD-10-CM

## 2022-06-13 DIAGNOSIS — R42 Dizziness and giddiness: Secondary | ICD-10-CM | POA: Diagnosis not present

## 2022-06-13 MED ORDER — CARBAMAZEPINE 200 MG PO TABS
400.0000 mg | ORAL_TABLET | Freq: Three times a day (TID) | ORAL | 4 refills | Status: DC
Start: 2022-06-13 — End: 2023-06-13

## 2022-06-13 NOTE — Patient Instructions (Signed)
Decrease Tegretol to 2 tablets twice daily for the next 6 months then further decrease to 2 tablets nightly  Continue your other medications  Follow up in a year or sooner if worse

## 2022-06-13 NOTE — Telephone Encounter (Signed)
medicare/[employer]ARP NPR sent to [facility] [phone] 

## 2022-06-13 NOTE — Progress Notes (Signed)
Patient: Brandon Flores Date of Birth: Aug 23, 1945  Reason for Visit: Follow up for seizures   History from: Patient, wife Primary Neurologist: Brandon Flores  ASSESSMENT AND PLAN 77 y.o. year old male   62.  History of seizures: partial complex type intractable up until switching to generic carbamazepine; generalized seizure 35 years ago 2.  Left anterior temporal encephalomalacia 3.  History of prostate cancer  -He is interested in trying to reduce his dose of carbamazepine, and consider working towards discontinuation; Plan will be to decrease Tegretol to 400 mg twice daily for 6 months and to further decrease the dose to 400 mg nightly.  -At our next visit, if no seizures, will stop the Tegretol  -His last EEG was normal.  -If any seizures, please restart Tegretol and contact us.  -MRI Brain to rule out posterior circulation stroke due to severe dizziness -Follow up in a year   HISTORY OF PRESENT ILLNESS: Today 06/13/22 Patient presents today for follow-up, he is accompanied by wife.  Last visit was a year ago and states that he has not had any seizures.  He remains on Tegretol 400 mg 3 times daily.  Again he is interested in reducing the dose and coming off medication. His main complaint today is severe dizziness that happen Friday.  He has a history of vertigo and so far had 2 episodes this year but the episode on Friday was the worst 10 out of 10, he got up, trying to get to the bathroom and has severe vertigo described as room spinning sensation.  He had trouble walking and actually fell, his knees gave out twice.  Denies any injury from the falls.  He is taking meclizine but dizziness has been going on for the past 3 days.  He did not go to the hospital.   INTERVAL HISTORY 12/12/2021 [name] is here today for follow-up.  Remains on generic Tegretol, he switch around 2021.  Previously he reported seizures on generic.  For years, he paid for brand-name, the price became very  high.  Currently pays $140 for 41-monthsupply generic.  He is interested in trying to reduce the dose, and move towards a trial off seizure medicine completely.  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  His last generalized with loss of consciousness seizure was 35 years ago.  This occurred on 2 separate occasions when he forgot to take the evening dose.  Over the years, he may have a few partial seizure type event described as dj vu, usually triggered by bright lights/action/sudden movements on video games/TV.  Denies any of these since being on generic.  [facility].  HISTORY  12/06/20 MM: Brandon Flores a 77year old male with a history of seizures.  He returns today for follow-up.  He is now on carbamazepine and so far has tolerated it well.  No breakthrough seizures.  In the past when he was on generic he did have breakthrough seizures.  He is currently retired.  Was diagnosed with prostate cancer last year and has undergone 28 sessions of radiation.  He continues to operate a motor vehicle.  He returns today for an evaluation.  REVIEW OF SYSTEMS: Out of a complete 14 system review of symptoms, the patient complains only  of the following symptoms, and all other reviewed systems are negative.  See HPI  ALLERGIES: No Known Allergies  HOME MEDICATIONS: Outpatient Medications Prior to Visit  Medication Sig Dispense Refill   allopurinol (ZYLOPRIM) 100 MG tablet Take 1 tablet (100 mg total) by mouth daily. 90 tablet 3   carbamazepine (TEGRETOL) 200 MG tablet Take 2 tablets (400 mg total) by mouth 3 (three) times daily. WILL TRY SWITCH TO GENERIC 540 tablet 4   dutasteride (AVODART) 0.5 MG capsule Take 1 capsule (0.5 mg total) by mouth every evening. 90 capsule 3   levothyroxine  (SYNTHROID) 88 MCG tablet Take 1 tablet (88 mcg total) by mouth daily before breakfast. 30 tablet 6   meclizine (ANTIVERT) 25 MG tablet Take 1 tablet (25 mg total) by mouth 3 (three) times daily as needed for dizziness. 30 tablet 6   meloxicam (MOBIC) 15 MG tablet Take 15 mg by mouth daily.     OVER THE COUNTER MEDICATION Stool softener with probiotic twice a week.     tamsulosin (FLOMAX) 0.4 MG CAPS capsule Take 2 capsules (0.8 mg total) by mouth daily after supper. 180 capsule 3   No facility-administered medications prior to visit.    PAST MEDICAL HISTORY: Past Medical History:  Diagnosis Date   Cervical radiculopathy    Colon polyp    (2006- adenoma and tubulovillous adenoma; 2010-adenomas; repeat 201)-Dr. Wynetta Emery   Complex partial seizure disorder ([place]) 10/08/2014   GERD (gastroesophageal reflux disease)    Gout    Hypercholesterolemia    Lateral epicondylitis    2002   Migraine    headaches   Ophthalmalgia    Dr. Gershon Crane   Orthodontics    Dr. Junius Roads   Partial epilepsy with impairment of consciousness ([place])    Plantar fasciitis    Prostate cancer ([place])    Seizure disorder ([place])    Dr. Sabra Heck ([facility])   Seizures ([place])    UTI (urinary tract infection)    Vertebrobasilar insufficiency    Vertigo     PAST SURGICAL HISTORY: Past Surgical History:  Procedure Laterality Date   BACK SURGERY     HEMORROIDECTOMY     NECK SURGERY     PROSTATE BIOPSY      FAMILY HISTORY: Family History  Problem Relation Age of Onset   Alcohol abuse Father    COPD Sister    Lung cancer Sister    Cancer Sister    Lung cancer Brother    Cancer Brother    Cancer Sister        lung   Lung cancer Sister    Lung cancer Brother    Cancer Brother    Seizures Neg Hx    Colon cancer Neg Hx    Esophageal cancer Neg Hx    Rectal cancer Neg Hx    Stomach cancer Neg Hx    Breast cancer Neg Hx    Prostate cancer Neg Hx    Pancreatic cancer Neg Hx     SOCIAL HISTORY: Social History    Socioeconomic History   Marital status: Married    Spouse name: Brandon Flores   Number of children: 2   Years of education: 13   Highest education level: Not on file  Occupational History   Occupation: Therapist, nutritional    Comment: [employer]  Tobacco Use   Smoking status: Former    Packs/day: 3.00    Years: 20.00    Total pack years: 60.00    Types:  Cigarettes    Quit date: 04/02/1986    Years since quitting: 36.2   Smokeless tobacco: Never   Tobacco comments:    Quit 1988, was a heavy smoker (20-30/day)  Vaping Use   Vaping Use: Never used  Substance and Sexual Activity   Alcohol use: No   Drug use: No   Sexual activity: Not Currently  Other Topics Concern   Not on file  Social History Narrative   Occasional glass of caffeine   Patient is right handed.   Lives at home with his wife   They have 6 children together and 15 grandchildren   Social Determinants of Health   Financial Resource Strain: Not on file  Food Insecurity: Not on file  Transportation Needs: Not on file  Physical Activity: Not on file  Stress: Not on file  Social Connections: Not on file  Intimate Partner Violence: Not on file    PHYSICAL EXAM  There were no vitals filed for this visit.  There is no height or weight on file to calculate BMI.  Generalized: Well developed, in no acute distress  Neurological examination  Mentation: Alert oriented to time, place, history taking. Follows all commands speech and language fluent Cranial nerve II-XII: Pupils were equal round reactive to light. Extraocular movements were full, visual field were full on confrontational test. Facial sensation and strength were normal.  Head turning and shoulder shrug  were normal and symmetric. Motor: The motor testing reveals 5 over 5 strength of all 4 extremities. Good symmetric motor tone is noted throughout.  Sensory: Sensory testing is intact to soft touch on all 4 extremities. No evidence of  extinction is noted.  Coordination: Cerebellar testing reveals good finger-nose-finger and heel-to-shin bilaterally.  Gait and station: Gait is slightly wide-based, but steady and independent Reflexes: Deep tendon reflexes are symmetric but decreased  DIAGNOSTIC DATA (LABS, IMAGING, TESTING) - I reviewed patient records, labs, notes, testing and imaging myself where available.  Lab Results  Component Value Date   WBC 5.3 12/06/2021   HGB 14.3 12/06/2021   HCT 42.2 12/06/2021   MCV 94 12/06/2021   PLT 206 12/06/2021      Component Value Date/Time   NA 140 12/06/2021 1044   K 4.5 12/06/2021 1044   CL 103 12/06/2021 1044   CO2 23 12/06/2021 1044   GLUCOSE 100 (H) 12/06/2021 1044   GLUCOSE 102 (H) 06/30/2020 0858   BUN 16 12/06/2021 1044   CREATININE 0.93 12/06/2021 1044   CREATININE 1.04 06/30/2020 0858   CALCIUM 8.9 12/06/2021 1044   PROT 6.5 12/06/2021 1044   ALBUMIN 3.9 12/06/2021 1044   AST 14 12/06/2021 1044   ALT 10 12/06/2021 1044   ALKPHOS 90 12/06/2021 1044   BILITOT 0.3 12/06/2021 1044   GFRNONAA 72 11/11/2019 0825   GFRAA 83 11/11/2019 0825   Lab Results  Component Value Date   CHOL 226 (H) 09/15/2019   HDL 37 (L) 09/15/2019   LDLCALC 150 (H) 09/15/2019   LDLDIRECT 119.0 04/30/2016   TRIG 251 (H) 09/15/2019   CHOLHDL 6.1 (H) 09/15/2019   Lab Results  Component Value Date   HGBA1C 5.0 06/30/2020   No results found for: "VITAMINB12" Lab Results  Component Value Date   TSH 6.20 (H) 08/19/2020    Alric Ran, MD 06/13/2022, 11:30 AM [facility] [address] [place], [place] [address] [phone]

## 2022-06-21 ENCOUNTER — Ambulatory Visit (HOSPITAL_COMMUNITY): Payer: Medicare Other

## 2022-06-29 ENCOUNTER — Ambulatory Visit (HOSPITAL_COMMUNITY)
Admission: RE | Admit: 2022-06-29 | Discharge: 2022-06-29 | Disposition: A | Discharge status: Home / Self Care | Payer: Medicare Other | Source: Ambulatory Visit | Attending: Neurology | Admitting: Neurology

## 2022-06-29 DIAGNOSIS — R42 Dizziness and giddiness: Secondary | ICD-10-CM | POA: Diagnosis not present

## 2022-06-29 DIAGNOSIS — R569 Unspecified convulsions: Secondary | ICD-10-CM | POA: Diagnosis not present

## 2022-07-11 DIAGNOSIS — B351 Tinea unguium: Secondary | ICD-10-CM | POA: Diagnosis not present

## 2022-07-11 DIAGNOSIS — L603 Nail dystrophy: Secondary | ICD-10-CM | POA: Diagnosis not present

## 2022-07-11 DIAGNOSIS — G609 Hereditary and idiopathic neuropathy, unspecified: Secondary | ICD-10-CM | POA: Diagnosis not present

## 2022-07-11 DIAGNOSIS — M792 Neuralgia and neuritis, unspecified: Secondary | ICD-10-CM | POA: Diagnosis not present

## 2022-07-18 ENCOUNTER — Telehealth: Payer: Self-pay | Admitting: *Deleted

## 2022-07-18 NOTE — Telephone Encounter (Signed)
Pt called need Mri report results. Please call [phone]

## 2022-07-18 NOTE — Telephone Encounter (Signed)
Called and spoke to patient and went over results with him. Pt had no questions at this time but was encouraged to call back if questions arise. Pt verbalized understanding.

## 2022-07-26 DIAGNOSIS — E559 Vitamin D deficiency, unspecified: Secondary | ICD-10-CM | POA: Diagnosis not present

## 2022-07-26 DIAGNOSIS — C61 Malignant neoplasm of prostate: Secondary | ICD-10-CM | POA: Diagnosis not present

## 2022-07-26 DIAGNOSIS — R7982 Elevated C-reactive protein (CRP): Secondary | ICD-10-CM | POA: Diagnosis not present

## 2022-07-26 DIAGNOSIS — R609 Edema, unspecified: Secondary | ICD-10-CM | POA: Diagnosis not present

## 2022-07-26 DIAGNOSIS — M1000 Idiopathic gout, unspecified site: Secondary | ICD-10-CM | POA: Diagnosis not present

## 2022-07-26 DIAGNOSIS — R5382 Chronic fatigue, unspecified: Secondary | ICD-10-CM | POA: Diagnosis not present

## 2022-07-26 DIAGNOSIS — E119 Type 2 diabetes mellitus without complications: Secondary | ICD-10-CM | POA: Diagnosis not present

## 2022-07-26 DIAGNOSIS — K219 Gastro-esophageal reflux disease without esophagitis: Secondary | ICD-10-CM | POA: Diagnosis not present

## 2022-09-05 DIAGNOSIS — H4322 Crystalline deposits in vitreous body, left eye: Secondary | ICD-10-CM | POA: Diagnosis not present

## 2022-09-05 DIAGNOSIS — H25813 Combined forms of age-related cataract, bilateral: Secondary | ICD-10-CM | POA: Diagnosis not present

## 2022-09-05 DIAGNOSIS — H02831 Dermatochalasis of right upper eyelid: Secondary | ICD-10-CM | POA: Diagnosis not present

## 2022-09-05 DIAGNOSIS — H02834 Dermatochalasis of left upper eyelid: Secondary | ICD-10-CM | POA: Diagnosis not present

## 2022-09-08 ENCOUNTER — Emergency Department (HOSPITAL_COMMUNITY): Payer: Medicare Other

## 2022-09-08 ENCOUNTER — Encounter (HOSPITAL_COMMUNITY): Payer: Self-pay

## 2022-09-08 ENCOUNTER — Other Ambulatory Visit: Payer: Self-pay

## 2022-09-08 ENCOUNTER — Emergency Department (HOSPITAL_COMMUNITY)
Admission: EM | Admit: 2022-09-08 | Discharge: 2022-09-08 | Disposition: A | Discharge status: Home / Self Care | Payer: Medicare Other | Attending: Emergency Medicine | Admitting: Emergency Medicine

## 2022-09-08 DIAGNOSIS — G9389 Other specified disorders of brain: Secondary | ICD-10-CM | POA: Diagnosis not present

## 2022-09-08 DIAGNOSIS — S00412A Abrasion of left ear, initial encounter: Secondary | ICD-10-CM | POA: Diagnosis not present

## 2022-09-08 DIAGNOSIS — S39012A Strain of muscle, fascia and tendon of lower back, initial encounter: Secondary | ICD-10-CM | POA: Diagnosis not present

## 2022-09-08 DIAGNOSIS — Z23 Encounter for immunization: Secondary | ICD-10-CM | POA: Insufficient documentation

## 2022-09-08 DIAGNOSIS — W19XXXA Unspecified fall, initial encounter: Secondary | ICD-10-CM | POA: Insufficient documentation

## 2022-09-08 DIAGNOSIS — R42 Dizziness and giddiness: Secondary | ICD-10-CM | POA: Diagnosis not present

## 2022-09-08 DIAGNOSIS — S199XXA Unspecified injury of neck, initial encounter: Secondary | ICD-10-CM | POA: Diagnosis not present

## 2022-09-08 DIAGNOSIS — R9431 Abnormal electrocardiogram [ECG] [EKG]: Secondary | ICD-10-CM | POA: Diagnosis not present

## 2022-09-08 DIAGNOSIS — M25552 Pain in left hip: Secondary | ICD-10-CM | POA: Diagnosis not present

## 2022-09-08 DIAGNOSIS — S299XXA Unspecified injury of thorax, initial encounter: Secondary | ICD-10-CM | POA: Diagnosis not present

## 2022-09-08 DIAGNOSIS — M25512 Pain in left shoulder: Secondary | ICD-10-CM | POA: Diagnosis not present

## 2022-09-08 DIAGNOSIS — R739 Hyperglycemia, unspecified: Secondary | ICD-10-CM | POA: Insufficient documentation

## 2022-09-08 DIAGNOSIS — S29012A Strain of muscle and tendon of back wall of thorax, initial encounter: Secondary | ICD-10-CM | POA: Diagnosis not present

## 2022-09-08 DIAGNOSIS — S3992XA Unspecified injury of lower back, initial encounter: Secondary | ICD-10-CM | POA: Diagnosis present

## 2022-09-08 DIAGNOSIS — S7002XA Contusion of left hip, initial encounter: Secondary | ICD-10-CM | POA: Insufficient documentation

## 2022-09-08 DIAGNOSIS — S40012A Contusion of left shoulder, initial encounter: Secondary | ICD-10-CM | POA: Insufficient documentation

## 2022-09-08 DIAGNOSIS — R61 Generalized hyperhidrosis: Secondary | ICD-10-CM | POA: Diagnosis not present

## 2022-09-08 DIAGNOSIS — E1165 Type 2 diabetes mellitus with hyperglycemia: Secondary | ICD-10-CM | POA: Diagnosis not present

## 2022-09-08 LAB — BASIC METABOLIC PANEL
Anion gap: 13 (ref 5–15)
BUN: 22 mg/dL (ref 8–23)
CO2: 20 mmol/L — ABNORMAL LOW (ref 22–32)
Calcium: 8.5 mg/dL — ABNORMAL LOW (ref 8.9–10.3)
Chloride: 103 mmol/L (ref 98–111)
Creatinine, Ser: 1.11 mg/dL (ref 0.61–1.24)
GFR, Estimated: >60 mL/min (ref >60)
Glucose, Bld: 152 mg/dL — ABNORMAL HIGH (ref 70–99)
Potassium: 4 mmol/L (ref 3.5–5.1)
Sodium: 136 mmol/L (ref 135–145)

## 2022-09-08 LAB — URINALYSIS, W/ REFLEX TO CULTURE (INFECTION SUSPECTED)
Bilirubin Urine: NEGATIVE
Glucose, UA: NEGATIVE mg/dL
Hgb urine dipstick: NEGATIVE
Ketones, ur: NEGATIVE mg/dL
Leukocytes,Ua: NEGATIVE
Nitrite: NEGATIVE
Protein, ur: NEGATIVE mg/dL
Specific Gravity, Urine: 1.021 (ref 1.005–1.030)
pH: 5 (ref 5.0–8.0)

## 2022-09-08 LAB — CBC WITH DIFFERENTIAL/PLATELET
Abs Immature Granulocytes: 0.03 10*3/uL (ref 0.00–0.07)
Basophils Absolute: 0 10*3/uL (ref 0.0–0.1)
Basophils Relative: 1 %
Eosinophils Absolute: 0.1 10*3/uL (ref 0.0–0.5)
Eosinophils Relative: 2 %
HCT: 39.8 % (ref 39.0–52.0)
Hemoglobin: 13.5 g/dL (ref 13.0–17.0)
Immature Granulocytes: 0 %
Lymphocytes Relative: 14 %
Lymphs Abs: 1.2 10*3/uL (ref 0.7–4.0)
MCH: 31.6 pg (ref 26.0–34.0)
MCHC: 33.9 g/dL (ref 30.0–36.0)
MCV: 93.2 fL (ref 80.0–100.0)
Monocytes Absolute: 0.8 10*3/uL (ref 0.1–1.0)
Monocytes Relative: 9 %
Neutro Abs: 6.5 10*3/uL (ref 1.7–7.7)
Neutrophils Relative %: 74 %
Platelets: 175 10*3/uL (ref 150–400)
RBC: 4.27 MIL/uL (ref 4.22–5.81)
RDW: 13 % (ref 11.5–15.5)
WBC: 8.6 10*3/uL (ref 4.0–10.5)
nRBC: 0 % (ref 0.0–0.2)

## 2022-09-08 MED ORDER — SODIUM CHLORIDE 0.9 % IV BOLUS
500.0000 mL | Freq: Once | INTRAVENOUS | Status: AC
  Administered 2022-09-08: 500 mL via INTRAVENOUS

## 2022-09-08 MED ORDER — HYDROCODONE-ACETAMINOPHEN 5-325 MG PO TABS: 1.0000 | ORAL_TABLET | ORAL | 0 refills | Status: DC | PRN

## 2022-09-08 MED ORDER — FENTANYL CITRATE PF 50 MCG/ML IJ SOSY
50.0000 ug | PREFILLED_SYRINGE | Freq: Once | INTRAMUSCULAR | Status: AC
  Administered 2022-09-08: 50 ug via INTRAVENOUS
  Filled 2022-09-08: qty 1

## 2022-09-08 MED ORDER — ACETAMINOPHEN 500 MG PO TABS
1000.0000 mg | ORAL_TABLET | Freq: Once | ORAL | Status: AC
  Administered 2022-09-08: 1000 mg via ORAL
  Filled 2022-09-08: qty 2

## 2022-09-08 MED ORDER — TETANUS-DIPHTH-ACELL PERTUSSIS 5-2.5-18.5 LF-MCG/0.5 IM SUSY
0.5000 mL | PREFILLED_SYRINGE | Freq: Once | INTRAMUSCULAR | Status: AC
  Administered 2022-09-08: 0.5 mL via INTRAMUSCULAR
  Filled 2022-09-08: qty 0.5

## 2022-09-08 MED ORDER — SODIUM CHLORIDE 0.9 % IV BOLUS: 500.0000 mL | Freq: Once | INTRAVENOUS | Status: DC

## 2022-09-08 NOTE — ED Notes (Signed)
Patient to CT at this time

## 2022-09-08 NOTE — ED Notes (Signed)
Patient requesting C Collar be removed. Nurse educate patient C Collar cannot be removed until imaging completed and provider clears patient.

## 2022-09-08 NOTE — ED Triage Notes (Signed)
Pt bib ems for dizziness and fall. Pt was at [place] and started feeling dizziness while sitting worsened while standing. C/o left hip, shoulder, left head pain. No LOC. No blood thinners   EMS vitals 142/86 HR 70 SPO2 96% RA CBG 139

## 2022-09-08 NOTE — ED Provider Notes (Signed)
[place] EMERGENCY DEPARTMENT AT [facility] Provider Note   CSN: [id number] Arrival date & time: 09/08/22  4098     History  Chief Complaint  Patient presents with   Dizziness   Fall    Pt bib ems for dizziness and fall. Pt was at [employer] and started feeling dizziness while sitting worsened while standing. C/o left hip, shoulder, left head pain. No LOC. No blood thinners   EMS vitals 142/86 HR 70 SPO2 96% RA CBG 139    Brandon Flores is a 77 y.o. male.  Patient is a 77 year old male who presents after a fall.  He has a history of hyperlipidemia, seizures, GERD.  He is not anticoagulants.  He was finishing breakfast at this [employer] this morning and as he was walking out, he got dizzy and staggered and fell to the side, striking his head on the edge of a counter.  He also hit his right shoulder and right hip on the floor.  He complains of pain to this area.  He denies any neck pain.  He has some mid back pain.  He has had prior cervical and lower back surgeries per his report.  He does say that he has a history of prior vertigo.  However normally with the vertigo he things are spinning and he did not have that sensation today.  He denies any palpitations.  No chest pain or shortness of breath.  No numbness or weakness to his extremities.  No recent illnesses.  No fevers, cough colds, vomiting or diarrhea.  He did not pass out.  Now he just feels a little tired.       Home Medications Prior to Admission medications   Medication Sig Start Date End Date Taking? Authorizing Provider  HYDROcodone-acetaminophen (NORCO/VICODIN) 5-325 MG tablet Take 1-2 tablets by mouth every 4 (four) hours as needed. 09/08/22  Yes Rolan Bucco, MD  allopurinol (ZYLOPRIM) 100 MG tablet Take 1 tablet (100 mg total) by mouth daily. 08/19/20   Hilts, Casimiro Needle, MD  carbamazepine (TEGRETOL) 200 MG tablet Take 2 tablets (400 mg total) by mouth 3 (three) times daily. WILL TRY SWITCH TO GENERIC 06/13/22    Windell Norfolk, MD  dutasteride (AVODART) 0.5 MG capsule Take 1 capsule (0.5 mg total) by mouth every evening. 08/19/20   Hilts, Casimiro Needle, MD  levothyroxine (SYNTHROID) 88 MCG tablet Take 1 tablet (88 mcg total) by mouth daily before breakfast. 08/22/20   Hilts, Casimiro Needle, MD  meclizine (ANTIVERT) 25 MG tablet Take 1 tablet (25 mg total) by mouth 3 (three) times daily as needed for dizziness. 06/08/19   Hilts, Casimiro Needle, MD  meloxicam (MOBIC) 15 MG tablet Take 15 mg by mouth daily. 06/19/19   [provider]  OVER THE COUNTER MEDICATION Stool softener with probiotic twice a week.    [provider]  tamsulosin (FLOMAX) 0.4 MG CAPS capsule Take 2 capsules (0.8 mg total) by mouth daily after supper. 08/19/20   Hilts, Casimiro Needle, MD      Allergies    Patient has no known allergies.    Review of Systems   Review of Systems  Constitutional:  Positive for fatigue. Negative for chills, diaphoresis and fever.  HENT:  Negative for congestion, rhinorrhea and sneezing.   Eyes: Negative.   Respiratory:  Negative for cough, chest tightness and shortness of breath.   Cardiovascular:  Negative for chest pain and leg swelling.  Gastrointestinal:  Negative for abdominal pain, blood in stool, diarrhea, nausea and vomiting.  Genitourinary:  Negative for difficulty urinating, flank pain, frequency and hematuria.  Musculoskeletal:  Positive for arthralgias. Negative for back pain.  Skin:  Negative for rash.  Neurological:  Positive for dizziness. Negative for speech difficulty, weakness, numbness and headaches.    Physical Exam Updated Vital Signs BP (!) 180/98   Pulse 74   Temp 98.1 F (36.7 C) (Oral)   Resp 19   Ht 5\' 9"  (1.753 m)   Wt 106.6 kg   SpO2 92%   BMI 34.70 kg/m  Physical Exam Constitutional:      Appearance: He is well-developed.  HENT:     Head: Normocephalic.     Comments: Small superficial appearing abrasion to the left ear.,  No swelling or hematoma Eyes:     Pupils:  Pupils are equal, round, and reactive to light.  Neck:     Comments: No pain in the cervical spine.  There is some pain to the lower thoracic spine, no pain to the lumbosacral spine.  No step-offs or deformities. Cardiovascular:     Rate and Rhythm: Normal rate and regular rhythm.     Heart sounds: Normal heart sounds.  Pulmonary:     Effort: Pulmonary effort is normal. No respiratory distress.     Breath sounds: Normal breath sounds. No wheezing or rales.  Chest:     Chest wall: No tenderness.  Abdominal:     General: Bowel sounds are normal.     Palpations: Abdomen is soft.     Tenderness: There is no abdominal tenderness. There is no guarding or rebound.  Musculoskeletal:        General: Normal range of motion.     Comments: Positive tenderness to the posterior left shoulder.  There is also some tenderness to the lateral aspect of the left hip.  No deformities noted.  No pain on palpation or range of motion of the other extremities.  Pedal pulses are intact.  He has normal sensation and motor function in all extremities.  Lymphadenopathy:     Cervical: No cervical adenopathy.  Skin:    General: Skin is warm and dry.     Findings: No rash.  Neurological:     General: No focal deficit present.     Mental Status: He is alert and oriented to person, place, and time.     ED Results / Procedures / Treatments   Labs (all labs ordered are listed, but only abnormal results are displayed) Labs Reviewed  BASIC METABOLIC PANEL - Abnormal; Notable for the following components:      Result Value   CO2 20 (*)    Glucose, Bld 152 (*)    Calcium 8.5 (*)    All other components within normal limits  URINALYSIS, W/ REFLEX TO CULTURE (INFECTION SUSPECTED) - Abnormal; Notable for the following components:   Bacteria, UA RARE (*)    All other components within normal limits  CBC WITH DIFFERENTIAL/PLATELET  CBC WITH DIFFERENTIAL/PLATELET    EKG EKG Interpretation  Date/Time:  Saturday  September 08 2022 07:52:30 EDT Ventricular Rate:  76 PR Interval:  191 QRS Duration: 102 QT Interval:  349 QTC Calculation: 393 R Axis:   79 Text Interpretation: Sinus rhythm Borderline T abnormalities, inferior leads since last tracing no significant change Confirmed by Rolan Bucco [phone]) on 09/08/2022 10:04:30 AM  Radiology DG Shoulder Left  Result Date: 09/08/2022 CLINICAL DATA:  Fall.  Shoulder pain. EXAM: LEFT SHOULDER - 2+ VIEW COMPARISON:  07/01/2019 FINDINGS: No signs of  acute fracture or dislocation. Mild degenerative changes identified at the acromioclavicular joint. Soft tissues are unremarkable. The visualized left ribs appear intact. IMPRESSION: 1. No acute findings. 2. Mild AC joint osteoarthritis. Electronically Signed   By: Signa Kell M.D.   On: 09/08/2022 09:56   CT Thoracic Spine Wo Contrast  Result Date: 09/08/2022 CLINICAL DATA:  Back trauma EXAM: CT THORACIC SPINE WITHOUT CONTRAST TECHNIQUE: Multidetector CT images of the thoracic were obtained using the standard protocol without intravenous contrast. RADIATION DOSE REDUCTION: This exam was performed according to the departmental dose-optimization program which includes automated exposure control, adjustment of the mA and/or kV according to patient size and/or use of iterative reconstruction technique. COMPARISON:  None Available. FINDINGS: Alignment: Normal. Vertebrae: No acute fracture or focal pathologic process. Bulky spondylitic spurring Paraspinal and other soft tissues: Negative. Disc levels: Diffuse bulky degenerative spurring with multilevel ankylosis. IMPRESSION: 1. No acute finding. 2. Bulky spondylitic spurring with multilevel ankylosis. Electronically Signed   By: Tiburcio Pea M.D.   On: 09/08/2022 08:58   CT Head Wo Contrast  Result Date: 09/08/2022 CLINICAL DATA:  Neck trauma EXAM: CT HEAD WITHOUT CONTRAST CT CERVICAL SPINE WITHOUT CONTRAST TECHNIQUE: Multidetector CT imaging of the head and cervical spine was  performed following the standard protocol without intravenous contrast. Multiplanar CT image reconstructions of the cervical spine were also generated. RADIATION DOSE REDUCTION: This exam was performed according to the departmental dose-optimization program which includes automated exposure control, adjustment of the mA and/or kV according to patient size and/or use of iterative reconstruction technique. COMPARISON:  Brain MRI 03/08/2014 FINDINGS: CT HEAD FINDINGS Brain: No evidence of acute infarction, hemorrhage, hydrocephalus, extra-axial collection or mass lesion/mass effect. There is a cluster of coarse calcifications at the anterior left temporal lobe where there is volume loss with encephalomalacia and chronic blood products by 2015 brain MRI, question remote cavernoma. No swelling or masslike features. Patient has history of complex partial seizure. Vascular: No hyperdense vessel or unexpected calcification. Skull: Left scalp swelling without acute fracture. Sinuses/Orbits: No evidence of injury CT CERVICAL SPINE FINDINGS Alignment: Slight C3-4 anterolisthesis Skull base and vertebrae: No acute fracture. No primary bone lesion or focal pathologic process. Soft tissues and spinal canal: No prevertebral fluid or swelling. No visible canal hematoma. Disc levels:  Generalized disc space narrowing and endplate ridging. Upper chest: Negative IMPRESSION: No evidence of acute intracranial or cervical spine injury. Electronically Signed   By: Tiburcio Pea M.D.   On: 09/08/2022 08:51   CT Cervical Spine Wo Contrast  Result Date: 09/08/2022 CLINICAL DATA:  Neck trauma EXAM: CT HEAD WITHOUT CONTRAST CT CERVICAL SPINE WITHOUT CONTRAST TECHNIQUE: Multidetector CT imaging of the head and cervical spine was performed following the standard protocol without intravenous contrast. Multiplanar CT image reconstructions of the cervical spine were also generated. RADIATION DOSE REDUCTION: This exam was performed according to  the departmental dose-optimization program which includes automated exposure control, adjustment of the mA and/or kV according to patient size and/or use of iterative reconstruction technique. COMPARISON:  Brain MRI 03/08/2014 FINDINGS: CT HEAD FINDINGS Brain: No evidence of acute infarction, hemorrhage, hydrocephalus, extra-axial collection or mass lesion/mass effect. There is a cluster of coarse calcifications at the anterior left temporal lobe where there is volume loss with encephalomalacia and chronic blood products by 2015 brain MRI, question remote cavernoma. No swelling or masslike features. Patient has history of complex partial seizure. Vascular: No hyperdense vessel or unexpected calcification. Skull: Left scalp swelling without acute fracture. Sinuses/Orbits: No evidence  of injury CT CERVICAL SPINE FINDINGS Alignment: Slight C3-4 anterolisthesis Skull base and vertebrae: No acute fracture. No primary bone lesion or focal pathologic process. Soft tissues and spinal canal: No prevertebral fluid or swelling. No visible canal hematoma. Disc levels:  Generalized disc space narrowing and endplate ridging. Upper chest: Negative IMPRESSION: No evidence of acute intracranial or cervical spine injury. Electronically Signed   By: Tiburcio Pea M.D.   On: 09/08/2022 08:51    Procedures Procedures    Medications Ordered in ED Medications  sodium chloride 0.9 % bolus 500 mL (0 mLs Intravenous Stopped 09/08/22 1127)  acetaminophen (TYLENOL) tablet 1,000 mg (1,000 mg Oral Given 09/08/22 0939)  Tdap (BOOSTRIX) injection 0.5 mL (0.5 mLs Intramuscular Given 09/08/22 1132)  fentaNYL (SUBLIMAZE) injection 50 mcg (50 mcg Intravenous Given 09/08/22 1130)    ED Course/ Medical Decision Making/ A&P                             Medical Decision Making Amount and/or Complexity of Data Reviewed Labs: ordered. Radiology: ordered.  Risk OTC drugs. Prescription drug management.   Patient is a 77 year old male who  presents after he had an episode of dizziness and then fell.  It sounded more lightheadedness rather than vertiginous.  He does not have any significant nystagmus.  No neurologic deficits.  Labs reviewed and are nonconcerning.  His glucose is elevated.  He and his wife know that he has an elevated blood sugar with some recent blood work including a fasting glucose.  He apparently drinks a lot of Pepsi's daily.  He was given some IV fluids and has no ongoing dizziness.  He had CT scan of his head and cervical spine as well as his lumbosacral spine which showed no acute abnormality.  X-rays of his left shoulder and left hip were reviewed by me and confirmed by the radiologist to show no fractures.  He was given dose of fentanyl for his pain.  He was able to ambulate without any ongoing symptoms.  He was discharged home in good condition.  No indication for hospitalization currently.  He was given a prescription for short course of Vicodin.  He was given instructions on this.  His blood pressure is elevated in the ED.  I reviewed his chart and on the recent office visit, it was in the 140s systolic.  At this point I feel that he needs to have it followed up by his primary care doctor rather than initiating antihypertensive medication.  Was encouraged to have close follow-up with his PCP.  Return precautions were given.  Final Clinical Impression(s) / ED Diagnoses Final diagnoses:  Fall, initial encounter  Contusion of left shoulder, initial encounter  Contusion of left hip, initial encounter  Back strain, initial encounter  Hyperglycemia    Rx / DC Orders ED Discharge Orders          Ordered    HYDROcodone-acetaminophen (NORCO/VICODIN) 5-325 MG tablet  Every 4 hours PRN        09/08/22 1300              Rolan Bucco, MD 09/08/22 1303

## 2022-09-11 ENCOUNTER — Other Ambulatory Visit: Payer: Self-pay | Admitting: Family Medicine

## 2022-09-11 DIAGNOSIS — S46012A Strain of muscle(s) and tendon(s) of the rotator cuff of left shoulder, initial encounter: Secondary | ICD-10-CM

## 2022-09-16 ENCOUNTER — Ambulatory Visit
Admission: RE | Admit: 2022-09-16 | Discharge: 2022-09-16 | Disposition: A | Discharge status: Home / Self Care | Payer: Medicare Other | Source: Ambulatory Visit | Attending: Family Medicine | Admitting: Family Medicine

## 2022-09-16 DIAGNOSIS — M25512 Pain in left shoulder: Secondary | ICD-10-CM | POA: Diagnosis not present

## 2022-09-16 DIAGNOSIS — R600 Localized edema: Secondary | ICD-10-CM | POA: Diagnosis not present

## 2022-09-16 DIAGNOSIS — M7582 Other shoulder lesions, left shoulder: Secondary | ICD-10-CM | POA: Diagnosis not present

## 2022-09-16 DIAGNOSIS — S46012A Strain of muscle(s) and tendon(s) of the rotator cuff of left shoulder, initial encounter: Secondary | ICD-10-CM

## 2022-09-21 ENCOUNTER — Ambulatory Visit (INDEPENDENT_AMBULATORY_CARE_PROVIDER_SITE_OTHER): Payer: Medicare Other | Admitting: Orthopedic Surgery

## 2022-09-21 ENCOUNTER — Encounter: Payer: Self-pay | Admitting: Orthopedic Surgery

## 2022-09-21 DIAGNOSIS — S46012A Strain of muscle(s) and tendon(s) of the rotator cuff of left shoulder, initial encounter: Secondary | ICD-10-CM | POA: Diagnosis not present

## 2022-09-21 NOTE — Progress Notes (Unsigned)
Office Visit Note   Patient: Brandon Flores           Date of Birth: [birth year]           MRN: [id number] Visit Date: 09/21/2022 Requested by: Lavada Mesi, MD [address] [place],  [place] [address] PCP: Lavada Mesi, MD  Subjective: Chief Complaint  Patient presents with   Left Shoulder - Pain    HPI: Brandon Flores is a 77 y.o. male who presents to the office reporting left shoulder pain.  Date of injury 09/08/2022.  He fell on the left side while at [employer].  He is right-hand dominant.  The pain does wake him from sleep at night but it is improving.  Ambulates with a cane in that right upper extremity.  Does report decreased range of motion on that left-hand side.  No prior left shoulder surgery.  Takes oxycodone from the emergency department without relief.  Norco from Dr. Prince Rome has helped him the most.  Patient states that his movement is getting better all the time.  He does have an MRI scan which shows complete tear of the supraspinatus tendon with 2 cm of retraction along with moderate tendinosis of the subscap biceps tendon as well as infraspinatus..                ROS: All systems reviewed are negative as they relate to the chief complaint within the history of present illness.  Patient denies fevers or chills.  Assessment & Plan: Visit Diagnoses: No diagnosis found.  Plan: Impression is left shoulder rotator cuff tear following a fall 2 weeks ago.  His function is gradually improving.  Passive range of motion is maintained.  I would like for him to avoid lifting out in front of his body but continue to work on maintaining his range of motion.  We can see how much stronger and recover the shoulder becomes before deciding for or against surgical intervention.  We talked about therapy but he wants to work on this on his own.  Follow-up in 6 weeks for clinical recheck.  Follow-Up Instructions: No follow-ups on file.   Orders:  No orders of the defined types  were placed in this encounter.  No orders of the defined types were placed in this encounter.     Procedures: No procedures performed   Clinical Data: No additional findings.  Objective: Vital Signs: There were no vitals taken for this visit.  Physical Exam:  Constitutional: Patient appears well-developed HEENT:  Head: Normocephalic Eyes:EOM are normal Neck: Normal range of motion Cardiovascular: Normal rate Pulmonary/chest: Effort normal Neurologic: Patient is alert Skin: Skin is warm Psychiatric: Patient has normal mood and affect  Ortho Exam: Ortho exam demonstrates passive range of motion on the left of 70/95/165.  Active forward flexion on the left is 45 active abduction is about 85.  Does have some crepitus with internal and external rotation of the arm at 90 degrees of abduction.  Deltoid is functional.  No shoulder instability on examination.  Neck range of motion is full.  Specialty Comments:  No specialty comments available.  Imaging: No results found.   PMFS History: Patient Active Problem List   Diagnosis Date Noted   Encephalomalacia 12/06/2021   Malignant neoplasm of prostate (HCC) 02/03/2020   Obesity (BMI 30-39.9) 08/31/2016   Chronic neck pain 08/31/2016   Cervical radiculopathy 05/03/2016   HLD (hyperlipidemia) 05/03/2016   GERD (gastroesophageal reflux disease) 04/25/2016  Hyperuricemia 04/25/2016   Low back pain without sciatica 04/25/2016   Degenerative arthritis of left knee 04/25/2016   Metatarsalgia of both feet 04/25/2016   Gout 04/25/2016   Migraine headache 04/25/2016   Nuclear sclerosis of both eyes 04/25/2016   History of colonic polyps 04/25/2016   VBI (vertebrobasilar insufficiency) 04/25/2016   Vertigo 03/10/2014   Generalized convulsive epilepsy (HCC) 11/12/2013   Choroidal nevus, right 10/12/2013   Vitreous floaters 10/12/2013   Past Medical History:  Diagnosis Date   Cervical radiculopathy    Colon polyp    (2006-  adenoma and tubulovillous adenoma; 2010-adenomas; repeat 201)-Dr. Laural Benes   Complex partial seizure disorder (HCC) 10/08/2014   GERD (gastroesophageal reflux disease)    Gout    Hypercholesterolemia    Lateral epicondylitis    2002   Migraine    headaches   Ophthalmalgia    Dr. Nile Riggs   Orthodontics    Dr. Prince Rome   Partial epilepsy with impairment of consciousness (HCC)    Plantar fasciitis    Prostate cancer (HCC)    Seizure disorder (HCC)    Dr. Hyacinth Meeker ([facility])   Seizures (HCC)    UTI (urinary tract infection)    Vertebrobasilar insufficiency    Vertigo     Family History  Problem Relation Age of Onset   Alcohol abuse Father    COPD Sister    Lung cancer Sister    Cancer Sister    Lung cancer Brother    Cancer Brother    Cancer Sister        lung   Lung cancer Sister    Lung cancer Brother    Cancer Brother    Seizures Neg Hx    Colon cancer Neg Hx    Esophageal cancer Neg Hx    Rectal cancer Neg Hx    Stomach cancer Neg Hx    Breast cancer Neg Hx    Prostate cancer Neg Hx    Pancreatic cancer Neg Hx     Past Surgical History:  Procedure Laterality Date   BACK SURGERY     HEMORROIDECTOMY     NECK SURGERY     PROSTATE BIOPSY     Social History   Occupational History   Occupation: Financial planner    Comment: [employer]  Tobacco Use   Smoking status: Former    Packs/day: 3.00    Years: 20.00    Additional pack years: 0.00    Total pack years: 60.00    Types: Cigarettes    Quit date: 04/02/1986    Years since quitting: 36.4   Smokeless tobacco: Never   Tobacco comments:    Quit 1988, was a heavy smoker (20-30/day)  Vaping Use   Vaping Use: Never used  Substance and Sexual Activity   Alcohol use: No   Drug use: No   Sexual activity: Not Currently

## 2022-10-03 DIAGNOSIS — I739 Peripheral vascular disease, unspecified: Secondary | ICD-10-CM | POA: Diagnosis not present

## 2022-10-03 DIAGNOSIS — G609 Hereditary and idiopathic neuropathy, unspecified: Secondary | ICD-10-CM | POA: Diagnosis not present

## 2022-10-03 DIAGNOSIS — B351 Tinea unguium: Secondary | ICD-10-CM | POA: Diagnosis not present

## 2022-10-03 DIAGNOSIS — L609 Nail disorder, unspecified: Secondary | ICD-10-CM | POA: Diagnosis not present

## 2022-10-03 DIAGNOSIS — L603 Nail dystrophy: Secondary | ICD-10-CM | POA: Diagnosis not present

## 2022-10-03 DIAGNOSIS — M792 Neuralgia and neuritis, unspecified: Secondary | ICD-10-CM | POA: Diagnosis not present

## 2022-10-15 DIAGNOSIS — L601 Onycholysis: Secondary | ICD-10-CM | POA: Diagnosis not present

## 2022-11-02 ENCOUNTER — Ambulatory Visit (INDEPENDENT_AMBULATORY_CARE_PROVIDER_SITE_OTHER): Payer: Medicare Other | Admitting: Orthopedic Surgery

## 2022-11-02 ENCOUNTER — Encounter: Payer: Self-pay | Admitting: Orthopedic Surgery

## 2022-11-02 DIAGNOSIS — S46012A Strain of muscle(s) and tendon(s) of the rotator cuff of left shoulder, initial encounter: Secondary | ICD-10-CM

## 2022-11-02 NOTE — Progress Notes (Signed)
Office Visit Note   Patient: Brandon Flores           Date of Birth: 05-30-1945           MRN: [id number] Visit Date: 11/02/2022 Requested by: Lavada Mesi, MD [address] [place],  [place] [address] PCP: Lavada Mesi, MD  Subjective: Chief Complaint  Patient presents with   Left Shoulder - Follow-up    DOI:09/08/22    HPI: Brandon Flores is a 77 y.o. male who presents to the office reporting left shoulder pain.  He does have a known rotator cuff tear.  Date of injury 09/08/2022.  He has been in a sling but overall he is making good progress.  Currently he is functional..                ROS: All systems reviewed are negative as they relate to the chief complaint within the history of present illness.  Patient denies fevers or chills.  Assessment & Plan: Visit Diagnoses:  1. Traumatic complete tear of left rotator cuff, initial encounter     Plan: Impression is good functional recovery following left shoulder rotator cuff tear.  He does have forward flexion and abduction above 90 degrees.  Pretty good external rotation strength.  Needs to be careful with lifting things out away from his body.  No indication for surgical intervention at this time.  Follow-up as needed.  Follow-Up Instructions: No follow-ups on file.   Orders:  No orders of the defined types were placed in this encounter.  No orders of the defined types were placed in this encounter.     Procedures: No procedures performed   Clinical Data: No additional findings.  Objective: Vital Signs: There were no vitals taken for this visit.  Physical Exam:  Constitutional: Patient appears well-developed HEENT:  Head: Normocephalic Eyes:EOM are normal Neck: Normal range of motion Cardiovascular: Normal rate Pulmonary/chest: Effort normal Neurologic: Patient is alert Skin: Skin is warm Psychiatric: Patient has normal mood and affect  Ortho Exam: Ortho exam demonstrates on that left-hand  side external rotation strength 5+ out of 5.  Subscap strength 5+ out of 5.  He does have forward flexion and abduction just above 90 degrees.  Not quite at the level of the right shoulder.  Surprisingly not too much crepitus or coarseness with internal and external rotation at 90 degrees of a B duction on the left.  Specialty Comments:  No specialty comments available.  Imaging: No results found.   PMFS History: Patient Active Problem List   Diagnosis Date Noted   Encephalomalacia 12/06/2021   Malignant neoplasm of prostate (HCC) 02/03/2020   Obesity (BMI 30-39.9) 08/31/2016   Chronic neck pain 08/31/2016   Cervical radiculopathy 05/03/2016   HLD (hyperlipidemia) 05/03/2016   GERD (gastroesophageal reflux disease) 04/25/2016   Hyperuricemia 04/25/2016   Low back pain without sciatica 04/25/2016   Degenerative arthritis of left knee 04/25/2016   Metatarsalgia of both feet 04/25/2016   Gout 04/25/2016   Migraine headache 04/25/2016   Nuclear sclerosis of both eyes 04/25/2016   History of colonic polyps 04/25/2016   VBI (vertebrobasilar insufficiency) 04/25/2016   Vertigo 03/10/2014   Generalized convulsive epilepsy (HCC) 11/12/2013   Choroidal nevus, right 10/12/2013   Vitreous floaters 10/12/2013   Past Medical History:  Diagnosis Date   Cervical radiculopathy    Colon polyp    (2006- adenoma and tubulovillous adenoma; 2010-adenomas; repeat 201)-Dr. Laural Benes   Complex partial seizure disorder (  HCC) 10/08/2014   GERD (gastroesophageal reflux disease)    Gout    Hypercholesterolemia    Lateral epicondylitis    2002   Migraine    headaches   Ophthalmalgia    Dr. Nile Riggs   Orthodontics    Dr. Prince Rome   Partial epilepsy with impairment of consciousness (HCC)    Plantar fasciitis    Prostate cancer (HCC)    Seizure disorder (HCC)    Dr. Hyacinth Meeker ([facility])   Seizures [facility])    UTI (urinary tract infection)    Vertebrobasilar insufficiency    Vertigo     Family History   Problem Relation Age of Onset   Alcohol abuse Father    COPD Sister    Lung cancer Sister    Cancer Sister    Lung cancer Brother    Cancer Brother    Cancer Sister        lung   Lung cancer Sister    Lung cancer Brother    Cancer Brother    Seizures Neg Hx    Colon cancer Neg Hx    Esophageal cancer Neg Hx    Rectal cancer Neg Hx    Stomach cancer Neg Hx    Breast cancer Neg Hx    Prostate cancer Neg Hx    Pancreatic cancer Neg Hx     Past Surgical History:  Procedure Laterality Date   BACK SURGERY     HEMORROIDECTOMY     NECK SURGERY     PROSTATE BIOPSY     Social History   Occupational History   Occupation: Financial planner    Comment: [employer]  Tobacco Use   Smoking status: Former    Current packs/day: 0.00    Average packs/day: 3.0 packs/day for 20.0 years (60.0 ttl pk-yrs)    Types: Cigarettes    Start date: 04/02/1966    Quit date: 04/02/1986    Years since quitting: 36.6   Smokeless tobacco: Never   Tobacco comments:    Quit 1988, was a heavy smoker (20-30/day)  Vaping Use   Vaping status: Never Used  Substance and Sexual Activity   Alcohol use: No   Drug use: No   Sexual activity: Not Currently

## 2022-11-07 DIAGNOSIS — H8111 Benign paroxysmal vertigo, right ear: Secondary | ICD-10-CM | POA: Diagnosis not present

## 2022-11-07 DIAGNOSIS — R42 Dizziness and giddiness: Secondary | ICD-10-CM | POA: Diagnosis not present

## 2022-11-13 DIAGNOSIS — R42 Dizziness and giddiness: Secondary | ICD-10-CM | POA: Diagnosis not present

## 2022-11-13 DIAGNOSIS — H8111 Benign paroxysmal vertigo, right ear: Secondary | ICD-10-CM | POA: Diagnosis not present

## 2022-11-18 ENCOUNTER — Encounter (HOSPITAL_BASED_OUTPATIENT_CLINIC_OR_DEPARTMENT_OTHER): Payer: Self-pay | Admitting: Emergency Medicine

## 2022-11-18 ENCOUNTER — Emergency Department (HOSPITAL_BASED_OUTPATIENT_CLINIC_OR_DEPARTMENT_OTHER): Payer: Medicare Other

## 2022-11-18 ENCOUNTER — Emergency Department (HOSPITAL_BASED_OUTPATIENT_CLINIC_OR_DEPARTMENT_OTHER)
Admission: EM | Admit: 2022-11-18 | Discharge: 2022-11-18 | Disposition: A | Discharge status: Home / Self Care | Payer: Medicare Other | Attending: Emergency Medicine | Admitting: Emergency Medicine

## 2022-11-18 DIAGNOSIS — M47816 Spondylosis without myelopathy or radiculopathy, lumbar region: Secondary | ICD-10-CM | POA: Diagnosis not present

## 2022-11-18 DIAGNOSIS — M545 Low back pain, unspecified: Secondary | ICD-10-CM | POA: Insufficient documentation

## 2022-11-18 DIAGNOSIS — M898X8 Other specified disorders of bone, other site: Secondary | ICD-10-CM | POA: Diagnosis not present

## 2022-11-18 DIAGNOSIS — I700 Atherosclerosis of aorta: Secondary | ICD-10-CM | POA: Diagnosis not present

## 2022-11-18 DIAGNOSIS — M5459 Other low back pain: Secondary | ICD-10-CM | POA: Diagnosis not present

## 2022-11-18 MED ORDER — OXYCODONE-ACETAMINOPHEN 5-325 MG PO TABS: 1.0000 | ORAL_TABLET | Freq: Three times a day (TID) | ORAL | 0 refills | Status: DC | PRN

## 2022-11-18 MED ORDER — HYDROMORPHONE HCL 1 MG/ML IJ SOLN
0.5000 mg | Freq: Once | INTRAMUSCULAR | Status: AC
  Administered 2022-11-18: 0.5 mg via INTRAMUSCULAR
  Filled 2022-11-18: qty 1

## 2022-11-18 MED ORDER — PREDNISONE 20 MG PO TABS: 40.0000 mg | ORAL_TABLET | Freq: Every day | ORAL | 0 refills | Status: AC

## 2022-11-18 NOTE — Discharge Instructions (Addendum)
You were seen in the ER today for evaluation of your back pain. Your CT scan shows some degeneration. Given your history, I would like for you to follow-up with a spine specialist.  I have included information for [facility] into the discharge paperwork.  Please make sure you call to schedule an appointment.  For pain medication, you can take the Percocets with 650 mg of Tylenol every 8 hours as needed.  Please do not drive or operate heavy machinery while on this medication as it can make you sleepy.  I have also prescribed you prednisone which she will take once daily for the next 5 days.  You can also try over-the-counter lidocaine patches as well.  Additionally, please make sure you are getting in contact with your primary care doctor for further pain medication. If you have any concerns, new or worsening symptoms, please return to the nearest emergency department for reevaluation.  Contact a health care provider if: You have pain that does not get better with rest or medicine. You have new pain. You have a fever. You lose weight quickly. You have trouble doing your normal activities. You feel weak or numb in one or both of your legs or feet. Get help right away if: You are not able to control when you pee or poop. You have severe back pain and: Nausea or vomiting. Pain in your chest or abdomen. Shortness of breath. You faint. These symptoms may be an emergency. Get help right away. Call 911. Do not wait to see if the symptoms will go away. Do not drive yourself to the hospital.

## 2022-11-18 NOTE — ED Provider Notes (Signed)
  [place] EMERGENCY DEPARTMENT AT [facility] Provider Note   CSN: [id number] Arrival date & time: 11/18/22  1316     History Chief Complaint  Patient presents with   Back Pain    TYRIEK SEDANO Sr is a 77 y.o. male.   Back Pain      Home Medications Prior to Admission medications   Medication Sig Start Date End Date Taking? Authorizing Provider  allopurinol (ZYLOPRIM) 100 MG tablet Take 1 tablet (100 mg total) by mouth daily. 08/19/20   Hilts, Casimiro Needle, MD  carbamazepine (TEGRETOL) 200 MG tablet Take 2 tablets (400 mg total) by mouth 3 (three) times daily. WILL TRY SWITCH TO GENERIC 06/13/22   Windell Norfolk, MD  dutasteride (AVODART) 0.5 MG capsule Take 1 capsule (0.5 mg total) by mouth every evening. 08/19/20   Hilts, Casimiro Needle, MD  HYDROcodone-acetaminophen (NORCO/VICODIN) 5-325 MG tablet Take 1-2 tablets by mouth every 4 (four) hours as needed. 09/08/22   Rolan Bucco, MD  levothyroxine (SYNTHROID) 88 MCG tablet Take 1 tablet (88 mcg total) by mouth daily before breakfast. 08/22/20   Hilts, Casimiro Needle, MD  meclizine (ANTIVERT) 25 MG tablet Take 1 tablet (25 mg total) by mouth 3 (three) times daily as needed for dizziness. 06/08/19   Hilts, Casimiro Needle, MD  meloxicam (MOBIC) 15 MG tablet Take 15 mg by mouth daily. 06/19/19   [provider]  OVER THE COUNTER MEDICATION Stool softener with probiotic twice a week.    [provider]  tamsulosin (FLOMAX) 0.4 MG CAPS capsule Take 2 capsules (0.8 mg total) by mouth daily after supper. 08/19/20   Hilts, Casimiro Needle, MD      Allergies    Patient has no known allergies.    Review of Systems   Review of Systems  Musculoskeletal:  Positive for back pain.    Physical Exam Updated Vital Signs BP (!) 132/92 (BP Location: Right Arm)   Pulse 83   Temp 97.8 F (36.6 C) (Oral)   Resp 18   SpO2 99%  Physical Exam  ED Results / Procedures / Treatments   Labs (all labs ordered are listed, but only abnormal results are  displayed) Labs Reviewed - No data to display  EKG None  Radiology No results found.  Procedures Procedures   Medications Ordered in ED Medications - No data to display  ED Course/ Medical Decision Making/ A&P    Medical Decision Making Amount and/or Complexity of Data Reviewed Radiology: ordered.  77 y.o. male presents to the ER today for evaluation of ***. Differential diagnosis includes but is not limited to ***. Vital signs show ***. Physical exam as noted above.   CT imaging shows   ***We discussed plan at bedside. We discussed strict return precautions and red flag symptoms. The patient verbalized their understanding and agrees to the plan. The patient is stable and being discharged home in good condition.  Portions of this report may have been transcribed using voice recognition software. Every effort was made to ensure accuracy; however, inadvertent computerized transcription errors may be present.   Final Clinical Impression(s) / ED Diagnoses Final diagnoses:  None    Rx / DC Orders ED Discharge Orders     None

## 2022-11-18 NOTE — ED Triage Notes (Signed)
Pt presents to ED POV. Pt c/o severe lower back pain that began 2w ago. Pt reports that apin is relieved by rest. Worsens with movement. Hx of spinal surgery on L3-4

## 2022-11-29 DIAGNOSIS — C44609 Unspecified malignant neoplasm of skin of left upper limb, including shoulder: Secondary | ICD-10-CM | POA: Diagnosis not present

## 2022-11-30 ENCOUNTER — Other Ambulatory Visit: Payer: Self-pay | Admitting: Family Medicine

## 2022-11-30 DIAGNOSIS — M47816 Spondylosis without myelopathy or radiculopathy, lumbar region: Secondary | ICD-10-CM

## 2022-12-06 ENCOUNTER — Other Ambulatory Visit: Payer: Self-pay | Admitting: Family Medicine

## 2022-12-06 DIAGNOSIS — M47816 Spondylosis without myelopathy or radiculopathy, lumbar region: Secondary | ICD-10-CM

## 2022-12-07 DIAGNOSIS — G609 Hereditary and idiopathic neuropathy, unspecified: Secondary | ICD-10-CM | POA: Diagnosis not present

## 2022-12-07 DIAGNOSIS — M792 Neuralgia and neuritis, unspecified: Secondary | ICD-10-CM | POA: Diagnosis not present

## 2022-12-07 DIAGNOSIS — B351 Tinea unguium: Secondary | ICD-10-CM | POA: Diagnosis not present

## 2022-12-07 DIAGNOSIS — L603 Nail dystrophy: Secondary | ICD-10-CM | POA: Diagnosis not present

## 2022-12-07 DIAGNOSIS — I739 Peripheral vascular disease, unspecified: Secondary | ICD-10-CM | POA: Diagnosis not present

## 2022-12-11 NOTE — Discharge Instructions (Signed)

## 2022-12-12 ENCOUNTER — Ambulatory Visit
Admission: RE | Admit: 2022-12-12 | Discharge: 2022-12-12 | Disposition: A | Discharge status: Home / Self Care | Payer: Medicare Other | Source: Ambulatory Visit | Attending: Family Medicine | Admitting: Family Medicine

## 2022-12-12 DIAGNOSIS — M47816 Spondylosis without myelopathy or radiculopathy, lumbar region: Secondary | ICD-10-CM

## 2022-12-12 MED ORDER — METHYLPREDNISOLONE ACETATE 40 MG/ML INJ SUSP (RADIOLOG
80.0000 mg | Freq: Once | INTRAMUSCULAR | Status: AC
  Administered 2022-12-12: 80 mg via INTRA_ARTICULAR

## 2022-12-12 MED ORDER — IOPAMIDOL (ISOVUE-M 200) INJECTION 41%
1.0000 mL | Freq: Once | INTRAMUSCULAR | Status: AC
  Administered 2022-12-12: 1 mL via INTRA_ARTICULAR

## 2022-12-13 DIAGNOSIS — L570 Actinic keratosis: Secondary | ICD-10-CM | POA: Diagnosis not present

## 2022-12-13 DIAGNOSIS — D485 Neoplasm of uncertain behavior of skin: Secondary | ICD-10-CM | POA: Diagnosis not present

## 2022-12-19 DIAGNOSIS — M5136 Other intervertebral disc degeneration, lumbar region: Secondary | ICD-10-CM | POA: Diagnosis not present

## 2023-01-16 DIAGNOSIS — C44629 Squamous cell carcinoma of skin of left upper limb, including shoulder: Secondary | ICD-10-CM | POA: Diagnosis not present

## 2023-01-30 DIAGNOSIS — N1419 Nephropathy induced by other drugs, medicaments and biological substances: Secondary | ICD-10-CM | POA: Diagnosis not present

## 2023-01-30 DIAGNOSIS — Z1389 Encounter for screening for other disorder: Secondary | ICD-10-CM | POA: Diagnosis not present

## 2023-01-30 DIAGNOSIS — Z79899 Other long term (current) drug therapy: Secondary | ICD-10-CM | POA: Diagnosis not present

## 2023-02-04 ENCOUNTER — Other Ambulatory Visit: Payer: Self-pay | Admitting: General Surgery

## 2023-02-04 DIAGNOSIS — C4492 Squamous cell carcinoma of skin, unspecified: Secondary | ICD-10-CM

## 2023-02-04 DIAGNOSIS — C792 Secondary malignant neoplasm of skin: Secondary | ICD-10-CM

## 2023-02-06 DIAGNOSIS — B351 Tinea unguium: Secondary | ICD-10-CM | POA: Diagnosis not present

## 2023-02-06 DIAGNOSIS — M792 Neuralgia and neuritis, unspecified: Secondary | ICD-10-CM | POA: Diagnosis not present

## 2023-02-06 DIAGNOSIS — I739 Peripheral vascular disease, unspecified: Secondary | ICD-10-CM | POA: Diagnosis not present

## 2023-02-06 DIAGNOSIS — G609 Hereditary and idiopathic neuropathy, unspecified: Secondary | ICD-10-CM | POA: Diagnosis not present

## 2023-02-06 DIAGNOSIS — L603 Nail dystrophy: Secondary | ICD-10-CM | POA: Diagnosis not present

## 2023-02-07 ENCOUNTER — Ambulatory Visit
Admission: RE | Admit: 2023-02-07 | Discharge: 2023-02-07 | Disposition: A | Discharge status: Home / Self Care | Payer: Medicare Other | Source: Ambulatory Visit | Attending: General Surgery | Admitting: General Surgery

## 2023-02-07 DIAGNOSIS — I700 Atherosclerosis of aorta: Secondary | ICD-10-CM | POA: Diagnosis not present

## 2023-02-07 DIAGNOSIS — K8020 Calculus of gallbladder without cholecystitis without obstruction: Secondary | ICD-10-CM | POA: Diagnosis not present

## 2023-02-07 DIAGNOSIS — Z8507 Personal history of malignant neoplasm of pancreas: Secondary | ICD-10-CM | POA: Diagnosis not present

## 2023-02-07 DIAGNOSIS — C792 Secondary malignant neoplasm of skin: Secondary | ICD-10-CM

## 2023-02-07 DIAGNOSIS — K5730 Diverticulosis of large intestine without perforation or abscess without bleeding: Secondary | ICD-10-CM | POA: Diagnosis not present

## 2023-02-07 DIAGNOSIS — C4492 Squamous cell carcinoma of skin, unspecified: Secondary | ICD-10-CM

## 2023-02-07 MED ORDER — IOPAMIDOL (ISOVUE-300) INJECTION 61%
100.0000 mL | Freq: Once | INTRAVENOUS | Status: AC | PRN
  Administered 2023-02-07: 100 mL via INTRAVENOUS

## 2023-04-15 ENCOUNTER — Other Ambulatory Visit: Payer: Self-pay | Admitting: General Surgery

## 2023-04-15 DIAGNOSIS — C792 Secondary malignant neoplasm of skin: Secondary | ICD-10-CM

## 2023-04-15 DIAGNOSIS — C4492 Squamous cell carcinoma of skin, unspecified: Secondary | ICD-10-CM

## 2023-05-06 ENCOUNTER — Ambulatory Visit
Admission: RE | Admit: 2023-05-06 | Discharge: 2023-05-06 | Disposition: A | Discharge status: Home / Self Care | Payer: Medicare Other | Source: Ambulatory Visit | Attending: General Surgery | Admitting: General Surgery

## 2023-05-06 DIAGNOSIS — I700 Atherosclerosis of aorta: Secondary | ICD-10-CM | POA: Diagnosis not present

## 2023-05-06 DIAGNOSIS — C4492 Squamous cell carcinoma of skin, unspecified: Secondary | ICD-10-CM

## 2023-05-06 DIAGNOSIS — C792 Secondary malignant neoplasm of skin: Secondary | ICD-10-CM

## 2023-05-06 DIAGNOSIS — R519 Headache, unspecified: Secondary | ICD-10-CM | POA: Diagnosis not present

## 2023-05-06 DIAGNOSIS — M542 Cervicalgia: Secondary | ICD-10-CM | POA: Diagnosis not present

## 2023-05-06 DIAGNOSIS — R42 Dizziness and giddiness: Secondary | ICD-10-CM | POA: Diagnosis not present

## 2023-05-06 MED ORDER — IOPAMIDOL (ISOVUE-370) INJECTION 76%
75.0000 mL | Freq: Once | INTRAVENOUS | Status: AC | PRN
  Administered 2023-05-06: 75 mL via INTRAVENOUS

## 2023-05-14 DIAGNOSIS — R2681 Unsteadiness on feet: Secondary | ICD-10-CM | POA: Diagnosis not present

## 2023-05-14 DIAGNOSIS — H8110 Benign paroxysmal vertigo, unspecified ear: Secondary | ICD-10-CM | POA: Diagnosis not present

## 2023-05-14 DIAGNOSIS — Z9181 History of falling: Secondary | ICD-10-CM | POA: Diagnosis not present

## 2023-05-14 DIAGNOSIS — M6281 Muscle weakness (generalized): Secondary | ICD-10-CM | POA: Diagnosis not present

## 2023-05-17 DIAGNOSIS — H8110 Benign paroxysmal vertigo, unspecified ear: Secondary | ICD-10-CM | POA: Diagnosis not present

## 2023-05-17 DIAGNOSIS — Z9181 History of falling: Secondary | ICD-10-CM | POA: Diagnosis not present

## 2023-05-17 DIAGNOSIS — M6281 Muscle weakness (generalized): Secondary | ICD-10-CM | POA: Diagnosis not present

## 2023-05-17 DIAGNOSIS — R2681 Unsteadiness on feet: Secondary | ICD-10-CM | POA: Diagnosis not present

## 2023-05-21 DIAGNOSIS — L570 Actinic keratosis: Secondary | ICD-10-CM | POA: Diagnosis not present

## 2023-05-21 DIAGNOSIS — Z85828 Personal history of other malignant neoplasm of skin: Secondary | ICD-10-CM | POA: Diagnosis not present

## 2023-05-21 DIAGNOSIS — L578 Other skin changes due to chronic exposure to nonionizing radiation: Secondary | ICD-10-CM | POA: Diagnosis not present

## 2023-05-21 DIAGNOSIS — D225 Melanocytic nevi of trunk: Secondary | ICD-10-CM | POA: Diagnosis not present

## 2023-05-21 DIAGNOSIS — L821 Other seborrheic keratosis: Secondary | ICD-10-CM | POA: Diagnosis not present

## 2023-05-21 DIAGNOSIS — L814 Other melanin hyperpigmentation: Secondary | ICD-10-CM | POA: Diagnosis not present

## 2023-05-22 DIAGNOSIS — H8110 Benign paroxysmal vertigo, unspecified ear: Secondary | ICD-10-CM | POA: Diagnosis not present

## 2023-05-22 DIAGNOSIS — M6281 Muscle weakness (generalized): Secondary | ICD-10-CM | POA: Diagnosis not present

## 2023-05-22 DIAGNOSIS — R2681 Unsteadiness on feet: Secondary | ICD-10-CM | POA: Diagnosis not present

## 2023-05-22 DIAGNOSIS — Z9181 History of falling: Secondary | ICD-10-CM | POA: Diagnosis not present

## 2023-05-24 DIAGNOSIS — H8110 Benign paroxysmal vertigo, unspecified ear: Secondary | ICD-10-CM | POA: Diagnosis not present

## 2023-05-24 DIAGNOSIS — Z9181 History of falling: Secondary | ICD-10-CM | POA: Diagnosis not present

## 2023-05-24 DIAGNOSIS — M6281 Muscle weakness (generalized): Secondary | ICD-10-CM | POA: Diagnosis not present

## 2023-05-24 DIAGNOSIS — R2681 Unsteadiness on feet: Secondary | ICD-10-CM | POA: Diagnosis not present

## 2023-05-29 DIAGNOSIS — Z9181 History of falling: Secondary | ICD-10-CM | POA: Diagnosis not present

## 2023-05-29 DIAGNOSIS — R2681 Unsteadiness on feet: Secondary | ICD-10-CM | POA: Diagnosis not present

## 2023-05-29 DIAGNOSIS — H8110 Benign paroxysmal vertigo, unspecified ear: Secondary | ICD-10-CM | POA: Diagnosis not present

## 2023-05-29 DIAGNOSIS — M6281 Muscle weakness (generalized): Secondary | ICD-10-CM | POA: Diagnosis not present

## 2023-05-31 DIAGNOSIS — M6281 Muscle weakness (generalized): Secondary | ICD-10-CM | POA: Diagnosis not present

## 2023-05-31 DIAGNOSIS — H8110 Benign paroxysmal vertigo, unspecified ear: Secondary | ICD-10-CM | POA: Diagnosis not present

## 2023-05-31 DIAGNOSIS — R2681 Unsteadiness on feet: Secondary | ICD-10-CM | POA: Diagnosis not present

## 2023-05-31 DIAGNOSIS — Z9181 History of falling: Secondary | ICD-10-CM | POA: Diagnosis not present

## 2023-06-05 DIAGNOSIS — R2681 Unsteadiness on feet: Secondary | ICD-10-CM | POA: Diagnosis not present

## 2023-06-05 DIAGNOSIS — M6281 Muscle weakness (generalized): Secondary | ICD-10-CM | POA: Diagnosis not present

## 2023-06-05 DIAGNOSIS — H8110 Benign paroxysmal vertigo, unspecified ear: Secondary | ICD-10-CM | POA: Diagnosis not present

## 2023-06-05 DIAGNOSIS — Z9181 History of falling: Secondary | ICD-10-CM | POA: Diagnosis not present

## 2023-06-07 DIAGNOSIS — R2681 Unsteadiness on feet: Secondary | ICD-10-CM | POA: Diagnosis not present

## 2023-06-07 DIAGNOSIS — Z9181 History of falling: Secondary | ICD-10-CM | POA: Diagnosis not present

## 2023-06-07 DIAGNOSIS — H8110 Benign paroxysmal vertigo, unspecified ear: Secondary | ICD-10-CM | POA: Diagnosis not present

## 2023-06-07 DIAGNOSIS — M6281 Muscle weakness (generalized): Secondary | ICD-10-CM | POA: Diagnosis not present

## 2023-06-12 DIAGNOSIS — Z9181 History of falling: Secondary | ICD-10-CM | POA: Diagnosis not present

## 2023-06-12 DIAGNOSIS — R2681 Unsteadiness on feet: Secondary | ICD-10-CM | POA: Diagnosis not present

## 2023-06-12 DIAGNOSIS — M6281 Muscle weakness (generalized): Secondary | ICD-10-CM | POA: Diagnosis not present

## 2023-06-12 DIAGNOSIS — H8110 Benign paroxysmal vertigo, unspecified ear: Secondary | ICD-10-CM | POA: Diagnosis not present

## 2023-06-13 ENCOUNTER — Ambulatory Visit (INDEPENDENT_AMBULATORY_CARE_PROVIDER_SITE_OTHER): Payer: Medicare Other | Admitting: Neurology

## 2023-06-13 ENCOUNTER — Encounter: Payer: Self-pay | Admitting: Neurology

## 2023-06-13 VITALS — BP 130/70 | HR 91 | Ht 69.0 in | Wt 237.0 lb

## 2023-06-13 DIAGNOSIS — Z5181 Encounter for therapeutic drug level monitoring: Secondary | ICD-10-CM | POA: Diagnosis not present

## 2023-06-13 DIAGNOSIS — G9389 Other specified disorders of brain: Secondary | ICD-10-CM | POA: Diagnosis not present

## 2023-06-13 DIAGNOSIS — R42 Dizziness and giddiness: Secondary | ICD-10-CM | POA: Diagnosis not present

## 2023-06-13 DIAGNOSIS — G40109 Localization-related (focal) (partial) symptomatic epilepsy and epileptic syndromes with simple partial seizures, not intractable, without status epilepticus: Secondary | ICD-10-CM | POA: Diagnosis not present

## 2023-06-13 MED ORDER — CARBAMAZEPINE 200 MG PO TABS: 400.0000 mg | ORAL_TABLET | Freq: Three times a day (TID) | ORAL | 4 refills | Status: AC

## 2023-06-13 NOTE — Patient Instructions (Signed)
 Continue with carbamazepine 400 mg 3 times daily Will check a carbamazepine level with BMP Continue your other medications Follow-up in 1 year or sooner if worse, can follow-up with Maralyn Sago

## 2023-06-13 NOTE — Progress Notes (Signed)
 Patient: Brandon Flores Date of Birth: 1946-01-04  Reason for Visit: Follow up for seizures   History from: Patient, wife Primary Neurologist: Brandon Flores  ASSESSMENT AND PLAN 78 y.o. year old male   1.  History of seizures: partial complex type intractable up until switching to generic carbamazepine; generalized seizure 35 years ago 2.  Left anterior temporal encephalomalacia 3.  History of prostate cancer  -Continue with Carbamazepine 400 mg three time daily  -His last EEG was normal.  -Please call for any seizures. -Continue with Vestibular therapy   -Follow up in a year   HISTORY OF PRESENT ILLNESS: Today 06/13/23 Patient presents today for follow-up, last visit was a year ago, at that time we had planned to decrease the carbamazepine but he tells me that he continued to have his typical [place] seizures, where he will have uncontrollable movement of his hands, mainly rubbing his abdomen.  He remains on the carbamazepine 400 mg 3 times daily, denies any side effect from the medication.  He tells me everything was fine since last visit until June 8 when he fell after a severe episode of vertigo, he did have a rotator cuff tear, no surgery required.  He is following up with PT, twice a week and report improvement of his dizziness.   INTERVAL HISTORY 06/13/2022:  Patient presents today for follow-up, he is accompanied by wife.  Last visit was a year ago and states that he has not had any seizures.  He remains on Tegretol 400 mg 3 times daily.  Again he is interested in reducing the dose and coming off medication. His main complaint today is severe dizziness that happen Friday.  He has a history of vertigo and so far had 2 episodes this year but the episode on Friday was the worst 10 out of 10, he got up, trying to get to the bathroom and has severe vertigo described as room spinning sensation.  He had trouble walking and actually fell, his knees gave out twice.  Denies any  injury from the falls.  He is taking meclizine but dizziness has been going on for the past 3 days.  He did not go to the hospital.   INTERVAL HISTORY 12/12/2021 [name] is here today for follow-up.  Remains on generic Tegretol, he switch around 2021.  Previously he reported seizures on generic.  For years, he paid for brand-name, the price became very high.  Currently pays $140 for 75-month supply generic.  He is interested in trying to reduce the dose, and move towards a trial off seizure medicine completely.  We reviewed his seizure history, when he was a younger man, initially diagnosed with seizures in the [employer], he was washing a jeep, passed out, was diagnosed with seizures.  His last generalized with loss of consciousness seizure was 35 years ago.  This occurred on 2 separate occasions when he forgot to take the evening dose.  Over the years, he may have a few partial seizure type event described as dj vu, usually triggered by bright lights/action/sudden movements on video games/TV.  Denies any of these since being on generic.  Reportedly admitted to [facility] several years ago for 5 days, I presume at [facility], no seizures were revealed.  HISTORY  12/06/20 MM: Brandon Flores is a 78 year old male with a history of seizures.  He returns today for follow-up.  He is now on carbamazepine and so far has tolerated it well.  No breakthrough seizures.  In the past when he was on generic he did have breakthrough seizures.  He is currently retired.  Was diagnosed with prostate cancer last year and has undergone 28 sessions of radiation.  He continues to operate a motor vehicle.  He returns today for an evaluation.  REVIEW OF SYSTEMS: Out of a complete 14 system review of symptoms, the patient complains only of the following symptoms, and all other reviewed systems are negative.  See HPI  ALLERGIES: No Known Allergies  HOME MEDICATIONS: Outpatient Medications Prior to Visit  Medication Sig Dispense Refill    allopurinol (ZYLOPRIM) 100 MG tablet Take 1 tablet (100 mg total) by mouth daily. 90 tablet 3   dutasteride (AVODART) 0.5 MG capsule Take 1 capsule (0.5 mg total) by mouth every evening. 90 capsule 3   levothyroxine (SYNTHROID) 88 MCG tablet Take 1 tablet (88 mcg total) by mouth daily before breakfast. 30 tablet 6   meclizine (ANTIVERT) 25 MG tablet Take 1 tablet (25 mg total) by mouth 3 (three) times daily as needed for dizziness. 30 tablet 6   meloxicam (MOBIC) 15 MG tablet Take 15 mg by mouth daily.     OVER THE COUNTER MEDICATION Stool softener with probiotic twice a week.     oxyCODONE-acetaminophen (PERCOCET/ROXICET) 5-325 MG tablet Take 1 tablet by mouth every 8 (eight) hours as needed for severe pain. 15 tablet 0   tamsulosin (FLOMAX) 0.4 MG CAPS capsule Take 2 capsules (0.8 mg total) by mouth daily after supper. 180 capsule 3   carbamazepine (TEGRETOL) 200 MG tablet Take 2 tablets (400 mg total) by mouth 3 (three) times daily. WILL TRY SWITCH TO GENERIC 540 tablet 4   No facility-administered medications prior to visit.    PAST MEDICAL HISTORY: Past Medical History:  Diagnosis Date   Cervical radiculopathy    Colon polyp    (2006- adenoma and tubulovillous adenoma; 2010-adenomas; repeat 201)-Dr. Laural Benes   Complex partial seizure disorder (HCC) 10/08/2014   GERD (gastroesophageal reflux disease)    Gout    Hypercholesterolemia    Lateral epicondylitis    2002   Migraine    headaches   Ophthalmalgia    Dr. Nile Riggs   Orthodontics    Dr. Prince Rome   Partial epilepsy with impairment of consciousness (HCC)    Plantar fasciitis    Prostate cancer (HCC)    Seizure disorder (HCC)    Dr. Hyacinth Meeker ([facility])   Seizures (HCC)    UTI (urinary tract infection)    Vertebrobasilar insufficiency    Vertigo     PAST SURGICAL HISTORY: Past Surgical History:  Procedure Laterality Date   BACK SURGERY     HEMORROIDECTOMY     NECK SURGERY     PROSTATE BIOPSY      FAMILY HISTORY: Family  History  Problem Relation Age of Onset   Alcohol abuse Father    COPD Sister    Lung cancer Sister    Cancer Sister    Lung cancer Brother    Cancer Brother    Cancer Sister        lung   Lung cancer Sister    Lung cancer Brother    Cancer Brother    Seizures Neg Hx    Colon cancer Neg Hx    Esophageal cancer Neg Hx    Rectal cancer Neg Hx    Stomach cancer Neg Hx    Breast cancer Neg Hx    Prostate cancer Neg Hx    Pancreatic cancer Neg  Hx     SOCIAL HISTORY: Social History   Socioeconomic History   Marital status: Married    Spouse name: Bud Face   Number of children: 2   Years of education: 13   Highest education level: Not on file  Occupational History   Occupation: Financial planner    Comment: [employer]  Tobacco Use   Smoking status: Former    Current packs/day: 0.00    Average packs/day: 3.0 packs/day for 20.0 years (60.0 ttl pk-yrs)    Types: Cigarettes    Start date: 04/02/1966    Quit date: 04/02/1986    Years since quitting: 37.2   Smokeless tobacco: Never   Tobacco comments:    Quit 1988, was a heavy smoker (20-30/day)  Vaping Use   Vaping status: Never Used  Substance and Sexual Activity   Alcohol use: No   Drug use: No   Sexual activity: Not Currently  Other Topics Concern   Not on file  Social History Narrative   Occasional glass of caffeine   Patient is right handed.   Lives at home with his wife   They have 6 children together and 15 grandchildren   Social Drivers of Corporate investment banker Strain: Not on file  Food Insecurity: Not on file  Transportation Needs: Not on file  Physical Activity: Not on file  Stress: Not on file  Social Connections: Not on file  Intimate Partner Violence: Not on file    PHYSICAL EXAM  Vitals:   06/13/23 1146  BP: 130/70  Pulse: 91  Weight: 237 lb (107.5 kg)  Height: 5\' 9"  (1.753 m)    Body mass index is 35 kg/m.  Generalized: Well developed, in no acute distress   Neurological examination  Mentation: Alert oriented to time, place, history taking. Follows all commands speech and language fluent Cranial nerve II-XII: Pupils were equal round reactive to light. Extraocular movements were full, visual field were full on confrontational test. Facial sensation and strength were normal.  Head turning and shoulder shrug  were normal and symmetric. Motor: The motor testing reveals 5 over 5 strength of all 4 extremities. Good symmetric motor tone is noted throughout.  Sensory: Sensory testing is intact to soft touch on all 4 extremities. No evidence of extinction is noted.  Coordination: Cerebellar testing reveals good finger-nose-finger and heel-to-shin bilaterally.  Gait and station: Gait is slightly wide-based, but steady and independent Reflexes: Deep tendon reflexes are symmetric but decreased  DIAGNOSTIC DATA (LABS, IMAGING, TESTING) - I reviewed patient records, labs, notes, testing and imaging myself where available.  Lab Results  Component Value Date   WBC 8.6 09/08/2022   HGB 13.5 09/08/2022   HCT 39.8 09/08/2022   MCV 93.2 09/08/2022   PLT 175 09/08/2022      Component Value Date/Time   NA 136 09/08/2022 0753   NA 140 12/06/2021 1044   K 4.0 09/08/2022 0753   CL 103 09/08/2022 0753   CO2 20 (L) 09/08/2022 0753   GLUCOSE 152 (H) 09/08/2022 0753   BUN 22 09/08/2022 0753   BUN 16 12/06/2021 1044   CREATININE 1.11 09/08/2022 0753   CREATININE 1.04 06/30/2020 0858   CALCIUM 8.5 (L) 09/08/2022 0753   PROT 6.5 12/06/2021 1044   ALBUMIN 3.9 12/06/2021 1044   AST 14 12/06/2021 1044   ALT 10 12/06/2021 1044   ALKPHOS 90 12/06/2021 1044   BILITOT 0.3 12/06/2021 1044   GFRNONAA >60 09/08/2022 0753   GFRAA 83  11/11/2019 0825   Lab Results  Component Value Date   CHOL 226 (H) 09/15/2019   HDL 37 (L) 09/15/2019   LDLCALC 150 (H) 09/15/2019   LDLDIRECT 119.0 04/30/2016   TRIG 251 (H) 09/15/2019   CHOLHDL 6.1 (H) 09/15/2019   Lab Results   Component Value Date   HGBA1C 5.0 06/30/2020   No results found for: "VITAMINB12" Lab Results  Component Value Date   TSH 6.20 (H) 08/19/2020   MRI Brain 07/02/2022 1. Stable compared to 2015. 2. Chronic hemorrhagic insult with cortical encephalomalacia in the inferior left temporal lobe. 3. Volume loss and likely mineralization at the left hippocampus compatible with mesial temporal sclerosis  CT Head 05/06/2023 1. No metastatic disease or acute intracranial abnormality. 2. Stable chronic left temporal lobe encephalomalacia. 3. Neck CT the same day reported separately.   Windell Norfolk, MD 06/13/2023, 12:25 PM [facility] [address] [place], [place] [address] [phone]

## 2023-06-14 ENCOUNTER — Encounter: Payer: Self-pay | Admitting: Neurology

## 2023-06-14 LAB — BASIC METABOLIC PANEL
BUN/Creatinine Ratio: 18 (ref 10–24)
BUN: 22 mg/dL (ref 8–27)
CO2: 24 mmol/L (ref 20–29)
Calcium: 8.9 mg/dL (ref 8.6–10.2)
Chloride: 102 mmol/L (ref 96–106)
Creatinine, Ser: 1.22 mg/dL (ref 0.76–1.27)
Glucose: 120 mg/dL — ABNORMAL HIGH (ref 70–99)
Potassium: 4.3 mmol/L (ref 3.5–5.2)
Sodium: 141 mmol/L (ref 134–144)
eGFR: 61 mL/min/{1.73_m2} (ref >59)

## 2023-06-14 LAB — CARBAMAZEPINE LEVEL, TOTAL: Carbamazepine (Tegretol), S: 12.8 ug/mL (ref 4.0–12.0)

## 2023-06-19 DIAGNOSIS — R2681 Unsteadiness on feet: Secondary | ICD-10-CM | POA: Diagnosis not present

## 2023-06-19 DIAGNOSIS — M6281 Muscle weakness (generalized): Secondary | ICD-10-CM | POA: Diagnosis not present

## 2023-06-19 DIAGNOSIS — Z9181 History of falling: Secondary | ICD-10-CM | POA: Diagnosis not present

## 2023-06-19 DIAGNOSIS — H8110 Benign paroxysmal vertigo, unspecified ear: Secondary | ICD-10-CM | POA: Diagnosis not present

## 2023-06-21 DIAGNOSIS — R2681 Unsteadiness on feet: Secondary | ICD-10-CM | POA: Diagnosis not present

## 2023-06-21 DIAGNOSIS — Z9181 History of falling: Secondary | ICD-10-CM | POA: Diagnosis not present

## 2023-06-21 DIAGNOSIS — M6281 Muscle weakness (generalized): Secondary | ICD-10-CM | POA: Diagnosis not present

## 2023-06-21 DIAGNOSIS — H8110 Benign paroxysmal vertigo, unspecified ear: Secondary | ICD-10-CM | POA: Diagnosis not present

## 2023-06-26 DIAGNOSIS — H8110 Benign paroxysmal vertigo, unspecified ear: Secondary | ICD-10-CM | POA: Diagnosis not present

## 2023-06-26 DIAGNOSIS — M6281 Muscle weakness (generalized): Secondary | ICD-10-CM | POA: Diagnosis not present

## 2023-06-26 DIAGNOSIS — Z9181 History of falling: Secondary | ICD-10-CM | POA: Diagnosis not present

## 2023-06-26 DIAGNOSIS — R2681 Unsteadiness on feet: Secondary | ICD-10-CM | POA: Diagnosis not present

## 2023-06-28 DIAGNOSIS — R2681 Unsteadiness on feet: Secondary | ICD-10-CM | POA: Diagnosis not present

## 2023-06-28 DIAGNOSIS — H8110 Benign paroxysmal vertigo, unspecified ear: Secondary | ICD-10-CM | POA: Diagnosis not present

## 2023-06-28 DIAGNOSIS — M6281 Muscle weakness (generalized): Secondary | ICD-10-CM | POA: Diagnosis not present

## 2023-06-28 DIAGNOSIS — Z9181 History of falling: Secondary | ICD-10-CM | POA: Diagnosis not present

## 2023-07-08 DIAGNOSIS — R2681 Unsteadiness on feet: Secondary | ICD-10-CM | POA: Diagnosis not present

## 2023-07-08 DIAGNOSIS — M6281 Muscle weakness (generalized): Secondary | ICD-10-CM | POA: Diagnosis not present

## 2023-07-08 DIAGNOSIS — Z9181 History of falling: Secondary | ICD-10-CM | POA: Diagnosis not present

## 2023-07-08 DIAGNOSIS — H8110 Benign paroxysmal vertigo, unspecified ear: Secondary | ICD-10-CM | POA: Diagnosis not present

## 2023-07-10 DIAGNOSIS — R2681 Unsteadiness on feet: Secondary | ICD-10-CM | POA: Diagnosis not present

## 2023-07-10 DIAGNOSIS — M6281 Muscle weakness (generalized): Secondary | ICD-10-CM | POA: Diagnosis not present

## 2023-07-10 DIAGNOSIS — Z9181 History of falling: Secondary | ICD-10-CM | POA: Diagnosis not present

## 2023-07-10 DIAGNOSIS — H8110 Benign paroxysmal vertigo, unspecified ear: Secondary | ICD-10-CM | POA: Diagnosis not present

## 2023-07-18 DIAGNOSIS — M6281 Muscle weakness (generalized): Secondary | ICD-10-CM | POA: Diagnosis not present

## 2023-07-18 DIAGNOSIS — H8110 Benign paroxysmal vertigo, unspecified ear: Secondary | ICD-10-CM | POA: Diagnosis not present

## 2023-07-18 DIAGNOSIS — Z9181 History of falling: Secondary | ICD-10-CM | POA: Diagnosis not present

## 2023-07-18 DIAGNOSIS — R2681 Unsteadiness on feet: Secondary | ICD-10-CM | POA: Diagnosis not present

## 2023-07-24 DIAGNOSIS — R2681 Unsteadiness on feet: Secondary | ICD-10-CM | POA: Diagnosis not present

## 2023-07-24 DIAGNOSIS — M6281 Muscle weakness (generalized): Secondary | ICD-10-CM | POA: Diagnosis not present

## 2023-07-24 DIAGNOSIS — H8110 Benign paroxysmal vertigo, unspecified ear: Secondary | ICD-10-CM | POA: Diagnosis not present

## 2023-07-24 DIAGNOSIS — Z9181 History of falling: Secondary | ICD-10-CM | POA: Diagnosis not present

## 2023-09-19 ENCOUNTER — Observation Stay (HOSPITAL_COMMUNITY)

## 2023-09-19 ENCOUNTER — Encounter (HOSPITAL_BASED_OUTPATIENT_CLINIC_OR_DEPARTMENT_OTHER): Payer: Self-pay

## 2023-09-19 ENCOUNTER — Emergency Department (HOSPITAL_BASED_OUTPATIENT_CLINIC_OR_DEPARTMENT_OTHER)

## 2023-09-19 ENCOUNTER — Other Ambulatory Visit: Payer: Self-pay

## 2023-09-19 ENCOUNTER — Inpatient Hospital Stay (HOSPITAL_BASED_OUTPATIENT_CLINIC_OR_DEPARTMENT_OTHER)
Admission: EM | Admit: 2023-09-19 | Discharge: 2023-09-23 | DRG: 690 | Disposition: A | Discharge status: Rehabilitation Facility | Attending: Internal Medicine | Admitting: Internal Medicine

## 2023-09-19 DIAGNOSIS — M1A00X0 Idiopathic chronic gout, unspecified site, without tophus (tophi): Secondary | ICD-10-CM | POA: Diagnosis not present

## 2023-09-19 DIAGNOSIS — A419 Sepsis, unspecified organism: Secondary | ICD-10-CM | POA: Diagnosis not present

## 2023-09-19 DIAGNOSIS — K5900 Constipation, unspecified: Secondary | ICD-10-CM | POA: Diagnosis present

## 2023-09-19 DIAGNOSIS — R29818 Other symptoms and signs involving the nervous system: Secondary | ICD-10-CM | POA: Diagnosis not present

## 2023-09-19 DIAGNOSIS — J9811 Atelectasis: Secondary | ICD-10-CM | POA: Diagnosis not present

## 2023-09-19 DIAGNOSIS — M4316 Spondylolisthesis, lumbar region: Secondary | ICD-10-CM | POA: Diagnosis present

## 2023-09-19 DIAGNOSIS — R42 Dizziness and giddiness: Secondary | ICD-10-CM

## 2023-09-19 DIAGNOSIS — N179 Acute kidney failure, unspecified: Secondary | ICD-10-CM | POA: Diagnosis present

## 2023-09-19 DIAGNOSIS — G459 Transient cerebral ischemic attack, unspecified: Secondary | ICD-10-CM | POA: Diagnosis not present

## 2023-09-19 DIAGNOSIS — E782 Mixed hyperlipidemia: Secondary | ICD-10-CM | POA: Diagnosis not present

## 2023-09-19 DIAGNOSIS — G450 Vertebro-basilar artery syndrome: Secondary | ICD-10-CM | POA: Diagnosis not present

## 2023-09-19 DIAGNOSIS — Z825 Family history of asthma and other chronic lower respiratory diseases: Secondary | ICD-10-CM

## 2023-09-19 DIAGNOSIS — Z801 Family history of malignant neoplasm of trachea, bronchus and lung: Secondary | ICD-10-CM | POA: Diagnosis not present

## 2023-09-19 DIAGNOSIS — N3001 Acute cystitis with hematuria: Secondary | ICD-10-CM | POA: Diagnosis not present

## 2023-09-19 DIAGNOSIS — N3000 Acute cystitis without hematuria: Principal | ICD-10-CM | POA: Diagnosis present

## 2023-09-19 DIAGNOSIS — D696 Thrombocytopenia, unspecified: Secondary | ICD-10-CM | POA: Diagnosis not present

## 2023-09-19 DIAGNOSIS — E669 Obesity, unspecified: Secondary | ICD-10-CM | POA: Diagnosis not present

## 2023-09-19 DIAGNOSIS — Z8673 Personal history of transient ischemic attack (TIA), and cerebral infarction without residual deficits: Secondary | ICD-10-CM | POA: Diagnosis not present

## 2023-09-19 DIAGNOSIS — R4182 Altered mental status, unspecified: Secondary | ICD-10-CM

## 2023-09-19 DIAGNOSIS — I10 Essential (primary) hypertension: Secondary | ICD-10-CM | POA: Diagnosis not present

## 2023-09-19 DIAGNOSIS — N3090 Cystitis, unspecified without hematuria: Secondary | ICD-10-CM | POA: Diagnosis not present

## 2023-09-19 DIAGNOSIS — Z79890 Hormone replacement therapy (postmenopausal): Secondary | ICD-10-CM | POA: Diagnosis not present

## 2023-09-19 DIAGNOSIS — G40309 Generalized idiopathic epilepsy and epileptic syndromes, not intractable, without status epilepticus: Secondary | ICD-10-CM | POA: Diagnosis present

## 2023-09-19 DIAGNOSIS — I651 Occlusion and stenosis of basilar artery: Secondary | ICD-10-CM | POA: Diagnosis not present

## 2023-09-19 DIAGNOSIS — R0989 Other specified symptoms and signs involving the circulatory and respiratory systems: Secondary | ICD-10-CM | POA: Diagnosis not present

## 2023-09-19 DIAGNOSIS — Z7982 Long term (current) use of aspirin: Secondary | ICD-10-CM | POA: Diagnosis not present

## 2023-09-19 DIAGNOSIS — Z87891 Personal history of nicotine dependence: Secondary | ICD-10-CM

## 2023-09-19 DIAGNOSIS — N400 Enlarged prostate without lower urinary tract symptoms: Secondary | ICD-10-CM | POA: Diagnosis not present

## 2023-09-19 DIAGNOSIS — B9620 Unspecified Escherichia coli [E. coli] as the cause of diseases classified elsewhere: Secondary | ICD-10-CM | POA: Diagnosis present

## 2023-09-19 DIAGNOSIS — I672 Cerebral atherosclerosis: Secondary | ICD-10-CM | POA: Diagnosis not present

## 2023-09-19 DIAGNOSIS — M5412 Radiculopathy, cervical region: Secondary | ICD-10-CM | POA: Diagnosis not present

## 2023-09-19 DIAGNOSIS — R0902 Hypoxemia: Secondary | ICD-10-CM | POA: Diagnosis not present

## 2023-09-19 DIAGNOSIS — I6622 Occlusion and stenosis of left posterior cerebral artery: Secondary | ICD-10-CM | POA: Diagnosis not present

## 2023-09-19 DIAGNOSIS — R109 Unspecified abdominal pain: Secondary | ICD-10-CM | POA: Diagnosis not present

## 2023-09-19 DIAGNOSIS — C61 Malignant neoplasm of prostate: Secondary | ICD-10-CM | POA: Diagnosis present

## 2023-09-19 DIAGNOSIS — R4781 Slurred speech: Secondary | ICD-10-CM | POA: Diagnosis present

## 2023-09-19 DIAGNOSIS — Z79899 Other long term (current) drug therapy: Secondary | ICD-10-CM

## 2023-09-19 DIAGNOSIS — G894 Chronic pain syndrome: Secondary | ICD-10-CM | POA: Diagnosis not present

## 2023-09-19 DIAGNOSIS — Z8546 Personal history of malignant neoplasm of prostate: Secondary | ICD-10-CM

## 2023-09-19 DIAGNOSIS — M5030 Other cervical disc degeneration, unspecified cervical region: Secondary | ICD-10-CM | POA: Diagnosis present

## 2023-09-19 DIAGNOSIS — Z791 Long term (current) use of non-steroidal anti-inflammatories (NSAID): Secondary | ICD-10-CM | POA: Diagnosis not present

## 2023-09-19 DIAGNOSIS — E7800 Pure hypercholesterolemia, unspecified: Secondary | ICD-10-CM | POA: Diagnosis present

## 2023-09-19 DIAGNOSIS — K219 Gastro-esophageal reflux disease without esophagitis: Secondary | ICD-10-CM | POA: Diagnosis present

## 2023-09-19 DIAGNOSIS — G40409 Other generalized epilepsy and epileptic syndromes, not intractable, without status epilepticus: Secondary | ICD-10-CM | POA: Diagnosis not present

## 2023-09-19 DIAGNOSIS — R000 Tachycardia, unspecified: Secondary | ICD-10-CM | POA: Diagnosis not present

## 2023-09-19 DIAGNOSIS — E039 Hypothyroidism, unspecified: Secondary | ICD-10-CM | POA: Diagnosis not present

## 2023-09-19 DIAGNOSIS — N3941 Urge incontinence: Secondary | ICD-10-CM | POA: Diagnosis not present

## 2023-09-19 DIAGNOSIS — I6612 Occlusion and stenosis of left anterior cerebral artery: Secondary | ICD-10-CM | POA: Diagnosis present

## 2023-09-19 DIAGNOSIS — G9389 Other specified disorders of brain: Secondary | ICD-10-CM | POA: Diagnosis not present

## 2023-09-19 DIAGNOSIS — R062 Wheezing: Secondary | ICD-10-CM | POA: Diagnosis not present

## 2023-09-19 DIAGNOSIS — I6523 Occlusion and stenosis of bilateral carotid arteries: Secondary | ICD-10-CM | POA: Diagnosis present

## 2023-09-19 DIAGNOSIS — R9389 Abnormal findings on diagnostic imaging of other specified body structures: Secondary | ICD-10-CM | POA: Diagnosis not present

## 2023-09-19 DIAGNOSIS — K8020 Calculus of gallbladder without cholecystitis without obstruction: Secondary | ICD-10-CM | POA: Diagnosis not present

## 2023-09-19 DIAGNOSIS — N390 Urinary tract infection, site not specified: Secondary | ICD-10-CM | POA: Diagnosis not present

## 2023-09-19 DIAGNOSIS — E785 Hyperlipidemia, unspecified: Secondary | ICD-10-CM | POA: Diagnosis present

## 2023-09-19 DIAGNOSIS — G40209 Localization-related (focal) (partial) symptomatic epilepsy and epileptic syndromes with complex partial seizures, not intractable, without status epilepticus: Secondary | ICD-10-CM | POA: Diagnosis not present

## 2023-09-19 DIAGNOSIS — M109 Gout, unspecified: Secondary | ICD-10-CM | POA: Diagnosis present

## 2023-09-19 DIAGNOSIS — R5381 Other malaise: Secondary | ICD-10-CM | POA: Diagnosis not present

## 2023-09-19 DIAGNOSIS — A4151 Sepsis due to Escherichia coli [E. coli]: Secondary | ICD-10-CM | POA: Diagnosis not present

## 2023-09-19 DIAGNOSIS — G40909 Epilepsy, unspecified, not intractable, without status epilepticus: Secondary | ICD-10-CM | POA: Diagnosis not present

## 2023-09-19 DIAGNOSIS — Z811 Family history of alcohol abuse and dependence: Secondary | ICD-10-CM

## 2023-09-19 LAB — CBC WITH DIFFERENTIAL/PLATELET
Abs Immature Granulocytes: 0.06 10*3/uL (ref 0.00–0.07)
Basophils Absolute: 0 10*3/uL (ref 0.0–0.1)
Basophils Relative: 0 %
Eosinophils Absolute: 0 10*3/uL (ref 0.0–0.5)
Eosinophils Relative: 0 %
HCT: 41.6 % (ref 39.0–52.0)
Hemoglobin: 14.4 g/dL (ref 13.0–17.0)
Immature Granulocytes: 1 %
Lymphocytes Relative: 8 %
Lymphs Abs: 1 10*3/uL (ref 0.7–4.0)
MCH: 31.5 pg (ref 26.0–34.0)
MCHC: 34.6 g/dL (ref 30.0–36.0)
MCV: 91 fL (ref 80.0–100.0)
Monocytes Absolute: 1.7 10*3/uL — ABNORMAL HIGH (ref 0.1–1.0)
Monocytes Relative: 15 %
Neutro Abs: 9 10*3/uL — ABNORMAL HIGH (ref 1.7–7.7)
Neutrophils Relative %: 76 %
Platelets: 199 10*3/uL (ref 150–400)
RBC: 4.57 MIL/uL (ref 4.22–5.81)
RDW: 13.2 % (ref 11.5–15.5)
WBC: 11.9 10*3/uL — ABNORMAL HIGH (ref 4.0–10.5)
nRBC: 0 % (ref 0.0–0.2)

## 2023-09-19 LAB — COMPREHENSIVE METABOLIC PANEL WITH GFR
ALT: 15 U/L (ref 0–44)
AST: 18 U/L (ref 15–41)
Albumin: 3.9 g/dL (ref 3.5–5.0)
Alkaline Phosphatase: 87 U/L (ref 38–126)
Anion gap: 16 — ABNORMAL HIGH (ref 5–15)
BUN: 21 mg/dL (ref 8–23)
CO2: 23 mmol/L (ref 22–32)
Calcium: 9.3 mg/dL (ref 8.9–10.3)
Chloride: 101 mmol/L (ref 98–111)
Creatinine, Ser: 1.3 mg/dL — ABNORMAL HIGH (ref 0.61–1.24)
GFR, Estimated: 57 mL/min — ABNORMAL LOW (ref >60)
Glucose, Bld: 135 mg/dL — ABNORMAL HIGH (ref 70–99)
Potassium: 3.6 mmol/L (ref 3.5–5.1)
Sodium: 140 mmol/L (ref 135–145)
Total Bilirubin: 1.1 mg/dL (ref 0.0–1.2)
Total Protein: 7.5 g/dL (ref 6.5–8.1)

## 2023-09-19 LAB — CARBAMAZEPINE LEVEL, TOTAL: Carbamazepine Lvl: 16.1 ug/mL (ref 4.0–12.0)

## 2023-09-19 LAB — URINALYSIS, W/ REFLEX TO CULTURE (INFECTION SUSPECTED)
Bacteria, UA: NONE SEEN
Bilirubin Urine: NEGATIVE
Glucose, UA: NEGATIVE mg/dL
Ketones, ur: NEGATIVE mg/dL
Nitrite: POSITIVE — AB
Protein, ur: 100 mg/dL — AB
Specific Gravity, Urine: 1.013 (ref 1.005–1.030)
WBC, UA: >50 WBC/hpf (ref 0–5)
pH: 6 (ref 5.0–8.0)

## 2023-09-19 LAB — PROTIME-INR
INR: 1.1 (ref 0.8–1.2)
Prothrombin Time: 14.8 s (ref 11.4–15.2)

## 2023-09-19 LAB — RESP PANEL BY RT-PCR (RSV, FLU A&B, COVID)  RVPGX2
Influenza A by PCR: NEGATIVE
Influenza B by PCR: NEGATIVE
Resp Syncytial Virus by PCR: NEGATIVE
SARS Coronavirus 2 by RT PCR: NEGATIVE

## 2023-09-19 LAB — LIPASE, BLOOD: Lipase: 10 U/L — ABNORMAL LOW (ref 11–51)

## 2023-09-19 LAB — LACTIC ACID, PLASMA: Lactic Acid, Venous: 1.4 mmol/L (ref 0.5–1.9)

## 2023-09-19 MED ORDER — MECLIZINE HCL 25 MG PO TABS: 25.0000 mg | ORAL_TABLET | Freq: Three times a day (TID) | ORAL | Status: DC | PRN

## 2023-09-19 MED ORDER — DUTASTERIDE 0.5 MG PO CAPS
0.5000 mg | ORAL_CAPSULE | Freq: Every evening | ORAL | Status: DC
  Administered 2023-09-19 – 2023-09-22 (×4): 0.5 mg via ORAL
  Filled 2023-09-19 (×5): qty 1

## 2023-09-19 MED ORDER — LACTATED RINGERS IV BOLUS (SEPSIS)
1000.0000 mL | Freq: Once | INTRAVENOUS | Status: AC
  Administered 2023-09-19: 1000 mL via INTRAVENOUS

## 2023-09-19 MED ORDER — ACETAMINOPHEN 500 MG PO TABS
1000.0000 mg | ORAL_TABLET | Freq: Once | ORAL | Status: AC
  Administered 2023-09-19: 1000 mg via ORAL
  Filled 2023-09-19: qty 2

## 2023-09-19 MED ORDER — ALLOPURINOL 100 MG PO TABS
100.0000 mg | ORAL_TABLET | Freq: Every day | ORAL | Status: DC
  Administered 2023-09-20 – 2023-09-23 (×4): 100 mg via ORAL
  Filled 2023-09-19 (×4): qty 1

## 2023-09-19 MED ORDER — IOHEXOL 350 MG/ML SOLN
100.0000 mL | Freq: Once | INTRAVENOUS | Status: AC | PRN
  Administered 2023-09-19: 85 mL via INTRAVENOUS

## 2023-09-19 MED ORDER — LACTATED RINGERS IV BOLUS
1000.0000 mL | Freq: Once | INTRAVENOUS | Status: AC
  Administered 2023-09-19: 1000 mL via INTRAVENOUS

## 2023-09-19 MED ORDER — IBUPROFEN 400 MG PO TABS
600.0000 mg | ORAL_TABLET | Freq: Once | ORAL | Status: AC
  Administered 2023-09-19: 600 mg via ORAL

## 2023-09-19 MED ORDER — ACETAMINOPHEN 325 MG PO TABS
650.0000 mg | ORAL_TABLET | Freq: Four times a day (QID) | ORAL | Status: DC | PRN
  Administered 2023-09-20 – 2023-09-22 (×5): 650 mg via ORAL
  Filled 2023-09-19 (×5): qty 2

## 2023-09-19 MED ORDER — IBUPROFEN 400 MG PO TABS
600.0000 mg | ORAL_TABLET | Freq: Once | ORAL | Status: DC
  Filled 2023-09-19: qty 1

## 2023-09-19 MED ORDER — IBUPROFEN 200 MG PO TABS
200.0000 mg | ORAL_TABLET | Freq: Once | ORAL | Status: AC
  Administered 2023-09-19: 200 mg via ORAL
  Filled 2023-09-19: qty 1

## 2023-09-19 MED ORDER — POLYETHYLENE GLYCOL 3350 17 G PO PACK
17.0000 g | PACK | Freq: Every day | ORAL | Status: DC | PRN
  Administered 2023-09-23: 17 g via ORAL
  Filled 2023-09-19: qty 1

## 2023-09-19 MED ORDER — ORAL CARE MOUTH RINSE: 15.0000 mL | OROMUCOSAL | Status: DC | PRN

## 2023-09-19 MED ORDER — SODIUM CHLORIDE 0.9 % IV SOLN
2.0000 g | INTRAVENOUS | Status: DC
  Administered 2023-09-20 – 2023-09-23 (×4): 2 g via INTRAVENOUS
  Filled 2023-09-19 (×4): qty 20

## 2023-09-19 MED ORDER — ASPIRIN 300 MG RE SUPP: 300.0000 mg | Freq: Every day | RECTAL | Status: DC

## 2023-09-19 MED ORDER — TAMSULOSIN HCL 0.4 MG PO CAPS
0.8000 mg | ORAL_CAPSULE | Freq: Every day | ORAL | Status: DC
  Administered 2023-09-19 – 2023-09-22 (×4): 0.8 mg via ORAL
  Filled 2023-09-19 (×5): qty 2

## 2023-09-19 MED ORDER — SODIUM CHLORIDE 0.9 % IV SOLN
2.0000 g | Freq: Once | INTRAVENOUS | Status: AC
  Administered 2023-09-19: 2 g via INTRAVENOUS
  Filled 2023-09-19: qty 20

## 2023-09-19 MED ORDER — LACTATED RINGERS IV SOLN: INTRAVENOUS | Status: DC

## 2023-09-19 MED ORDER — MECLIZINE HCL 25 MG PO TABS
25.0000 mg | ORAL_TABLET | Freq: Once | ORAL | Status: AC
  Administered 2023-09-19: 25 mg via ORAL
  Filled 2023-09-19: qty 1

## 2023-09-19 MED ORDER — ASPIRIN 325 MG PO TABS
325.0000 mg | ORAL_TABLET | Freq: Every day | ORAL | Status: DC
  Administered 2023-09-19 – 2023-09-23 (×5): 325 mg via ORAL
  Filled 2023-09-19 (×5): qty 1

## 2023-09-19 MED ORDER — LEVOTHYROXINE SODIUM 112 MCG PO TABS
112.0000 ug | ORAL_TABLET | Freq: Every day | ORAL | Status: DC
  Administered 2023-09-20 – 2023-09-23 (×4): 112 ug via ORAL
  Filled 2023-09-19 (×4): qty 1

## 2023-09-19 MED ORDER — CARBAMAZEPINE 200 MG PO TABS
400.0000 mg | ORAL_TABLET | Freq: Three times a day (TID) | ORAL | Status: DC
  Administered 2023-09-19 – 2023-09-23 (×12): 400 mg via ORAL
  Filled 2023-09-19 (×13): qty 2

## 2023-09-19 MED ORDER — STROKE: EARLY STAGES OF RECOVERY BOOK
Freq: Once | Status: AC
  Filled 2023-09-19: qty 1

## 2023-09-19 MED ORDER — ACETAMINOPHEN 650 MG RE SUPP: 650.0000 mg | Freq: Four times a day (QID) | RECTAL | Status: DC | PRN

## 2023-09-19 MED ORDER — SODIUM CHLORIDE 0.9% FLUSH
3.0000 mL | Freq: Two times a day (BID) | INTRAVENOUS | Status: DC
  Administered 2023-09-20 – 2023-09-23 (×7): 3 mL via INTRAVENOUS

## 2023-09-19 MED ORDER — IBUPROFEN 800 MG PO TABS: 800.0000 mg | ORAL_TABLET | Freq: Once | ORAL | Status: DC

## 2023-09-19 NOTE — ED Notes (Signed)
 [facility] here now given report

## 2023-09-19 NOTE — Progress Notes (Signed)
 Elink is following code sepsis.

## 2023-09-19 NOTE — ED Triage Notes (Signed)
 He reports feeling very dizzy upon getting up to go to b.r. at ~0200 today. He tells me he has hx of vertigo.

## 2023-09-19 NOTE — ED Provider Notes (Signed)
 [place] [facility]ERGENCY DEPARTMENT AT [facility] Provider Note  CSN: [id number] Arrival date & time: 09/19/23 6578  Chief Complaint(s) Dizziness  HPI Brandon Flores is a 78 y.o. male with past medical history as below, significant for complex partial seizure disorder, HLD, migraine headaches, vertigo, obesity, prostate cancer who presents to the ED with complaint of dizziness  Patient reports around 3 AM this morning To go the bathroom felt severe dizziness similar to his typical vertigo.  Room spinning sensation.  Difficulty walking down the hallway.  Once he got back to his bed he was unable to get back into the bed due to the vertigo, his wife made him a temporary bed on the floor and he slept on the floor.  She reports that during this time he was having some slurred speech that lasted for <60 minutes and resolved.  Upon EMS arrival to his home this morning he was still having a spinning sensation but not having any speech difficulty.  Unable to ambulate secondary to vertigo.  He has some wheezing that he feels is unchanged.  No nausea or vomiting, no chest pain or dyspnea.  Reports dysuria and increased urine frequency.  No rashes.  No recent falls or head injury.  No numbness or weakness to extremities  Past Medical History Past Medical History:  Diagnosis Date  . Cervical radiculopathy   . Colon polyp    (2006- adenoma and tubulovillous adenoma; 2010-adenomas; repeat 201)-Dr. Lincoln Renshaw  . Complex partial seizure disorder (HCC) 10/08/2014  . GERD (gastroesophageal reflux disease)   . Gout   . Hypercholesterolemia   . Lateral epicondylitis    2002  . Migraine    headaches  . Ophthalmalgia    Dr. Gennie Kicks  . Orthodontics    Dr. Armandina Bernard  . Partial epilepsy with impairment of consciousness (HCC)   . Plantar fasciitis   . Prostate cancer (HCC)   . Seizure disorder (HCC)    Dr. Annabell Key ([facility])  . Seizures (HCC)   . UTI (urinary tract infection)   . Vertebrobasilar insufficiency    . Vertigo    Patient Active Problem List   Diagnosis Date Noted  . Encephalomalacia 12/06/2021  . Malignant neoplasm of prostate (HCC) 02/03/2020  . Obesity (BMI 30-39.9) 08/31/2016  . Chronic neck pain 08/31/2016  . Cervical radiculopathy 05/03/2016  . HLD (hyperlipidemia) 05/03/2016  . GERD (gastroesophageal reflux disease) 04/25/2016  . Hyperuricemia 04/25/2016  . Low back pain without sciatica 04/25/2016  . Degenerative arthritis of left knee 04/25/2016  . Metatarsalgia of both feet 04/25/2016  . Gout 04/25/2016  . Migraine headache 04/25/2016  . Nuclear sclerosis of both eyes 04/25/2016  . History of colonic polyps 04/25/2016  . VBI (vertebrobasilar insufficiency) 04/25/2016  . Vertigo 03/10/2014  . Generalized convulsive epilepsy (HCC) 11/12/2013  . Choroidal nevus, right 10/12/2013  . Vitreous floaters 10/12/2013   Home Medication(s) Prior to Admission medications   Medication Sig Start Date End Date Taking? Authorizing Provider  levothyroxine  (SYNTHROID ) 112 MCG tablet Take 112 mcg by mouth daily. 09/17/23  Yes [provider]  allopurinol  (ZYLOPRIM ) 100 MG tablet Take 1 tablet (100 mg total) by mouth daily. 08/19/20   Hilts, Bambi Lever, MD  carbamazepine  (TEGRETOL ) 200 MG tablet Take 2 tablets (400 mg total) by mouth 3 (three) times daily. WILL TRY SWITCH TO GENERIC 06/13/23   Cassandra Cleveland, MD  dutasteride  (AVODART ) 0.5 MG capsule Take 1 capsule (0.5 mg total) by mouth every evening. 08/19/20   Hilts, Bambi Lever, MD  levothyroxine  (SYNTHROID ) 88 MCG tablet Take 1 tablet (88 mcg total) by mouth daily before breakfast. 08/22/20   Hilts, Bambi Lever, MD  meclizine  (ANTIVERT ) 25 MG tablet Take 1 tablet (25 mg total) by mouth 3 (three) times daily as needed for dizziness. 06/08/19   Hilts, Bambi Lever, MD  meloxicam (MOBIC) 15 MG tablet Take 15 mg by mouth daily. 06/19/19   [provider]  OVER THE COUNTER MEDICATION Stool softener with probiotic twice a week.    [provider]  oxyCODONE -acetaminophen  (PERCOCET/ROXICET) 5-325 MG tablet Take 1 tablet by mouth every 8 (eight) hours as needed for severe pain. 11/18/22   Spence Dux, PA-C  tamsulosin  (FLOMAX ) 0.4 MG CAPS capsule Take 2 capsules (0.8 mg total) by mouth daily after supper. 08/19/20   Hilts, Bambi Lever, MD                                                                                                                                    Past Surgical History Past Surgical History:  Procedure Laterality Date  . BACK SURGERY    . HEMORROIDECTOMY    . NECK SURGERY    . PROSTATE BIOPSY     Family History Family History  Problem Relation Age of Onset  . Alcohol abuse Father   . COPD Sister   . Lung cancer Sister   . Cancer Sister   . Lung cancer Brother   . Cancer Brother   . Cancer Sister        lung  . Lung cancer Sister   . Lung cancer Brother   . Cancer Brother   . Seizures Neg Hx   . Colon cancer Neg Hx   . Esophageal cancer Neg Hx   . Rectal cancer Neg Hx   . Stomach cancer Neg Hx   . Breast cancer Neg Hx   . Prostate cancer Neg Hx   . Pancreatic cancer Neg Hx     Social History Social History   Tobacco Use  . Smoking status: Former    Current packs/day: 0.00    Average packs/day: 3.0 packs/day for 20.0 years (60.0 ttl pk-yrs)    Types: Cigarettes    Start date: 04/02/1966    Quit date: 04/02/1986    Years since quitting: 37.4  . Smokeless tobacco: Never  . Tobacco comments:    Quit 1988, was a heavy smoker (20-30/day)  Vaping Use  . Vaping status: Never Used  Substance Use Topics  . Alcohol use: No  . Drug use: No   Allergies Patient has no known allergies.  Review of Systems A thorough review of systems was obtained and all systems are negative except as noted in the HPI and PMH.   Physical Exam Vital Signs  I have reviewed the triage vital signs BP (!) 154/75   Pulse 87   Temp 98 F (36.7 C) (Oral)   Resp 18   SpO2 97%  Physical Exam Vitals and  nursing note reviewed.  Constitutional:      General: He is not in acute distress.    Appearance: He is well-developed. He is obese.  HENT:     Head: Normocephalic and atraumatic.     Right Ear: External ear normal.     Left Ear: External ear normal.     Mouth/Throat:     Mouth: Mucous membranes are moist.   Eyes:     General: No scleral icterus.    Extraocular Movements: Extraocular movements intact.     Pupils: Pupils are equal, round, and reactive to light.     Comments: Horiz nystagmus    Cardiovascular:     Rate and Rhythm: Normal rate and regular rhythm.     Pulses: Normal pulses.     Heart sounds: Normal heart sounds.  Pulmonary:     Effort: Pulmonary effort is normal. No respiratory distress.     Breath sounds: Normal breath sounds.  Abdominal:     General: Abdomen is flat.     Palpations: Abdomen is soft.     Tenderness: There is no abdominal tenderness.   Musculoskeletal:     Cervical back: No rigidity.     Right lower leg: No edema.     Left lower leg: No edema.   Skin:    General: Skin is warm and dry.     Capillary Refill: Capillary refill takes less than 2 seconds.   Neurological:     Mental Status: He is alert and oriented to person, place, and time.     GCS: GCS eye subscore is 4. GCS verbal subscore is 5. GCS motor subscore is 6.     Cranial Nerves: Cranial nerves 2-12 are intact. No dysarthria or facial asymmetry.     Sensory: Sensation is intact. No sensory deficit.     Motor: Motor function is intact. No weakness.     Coordination: Coordination is intact.     Comments: Gait testing deferred secondary to patient safety.   Psychiatric:        Mood and Affect: Mood normal.        Behavior: Behavior normal.     ED Results and Treatments Labs (all labs ordered are listed, but only abnormal results are displayed) Labs Reviewed  COMPREHENSIVE METABOLIC PANEL WITH GFR - Abnormal; Notable for the following components:      Result Value   Glucose,  Bld 135 (*)    Creatinine, Ser 1.30 (*)    GFR, Estimated 57 (*)    Anion gap 16 (*)    All other components within normal limits  CBC WITH DIFFERENTIAL/PLATELET - Abnormal; Notable for the following components:   WBC 11.9 (*)    Neutro Abs 9.0 (*)    Monocytes Absolute 1.7 (*)    All other components within normal limits  URINALYSIS, W/ REFLEX TO CULTURE (INFECTION SUSPECTED) - Abnormal; Notable for the following components:   APPearance HAZY (*)    Hgb urine dipstick MODERATE (*)    Protein, ur 100 (*)    Nitrite POSITIVE (*)    Leukocytes,Ua LARGE (*)    All other components within normal limits  LIPASE, BLOOD - Abnormal; Notable for the following components:   Lipase 10 (*)    All other components within normal limits  CARBAMAZEPINE  LEVEL, TOTAL - Abnormal; Notable for the following components:   Carbamazepine  Lvl 16.1 (*)    All other components within normal limits  RESP PANEL BY  RT-PCR (RSV, FLU A&B, COVID)  RVPGX2  CULTURE, BLOOD (ROUTINE X 2)  CULTURE, BLOOD (ROUTINE X 2)  URINE CULTURE  LACTIC ACID, PLASMA  PROTIME-INR  LACTIC ACID, PLASMA                                                                                                                          Radiology CT ANGIO HEAD NECK W WO CM Result Date: 09/19/2023 CLINICAL DATA:  78 year old male with TIA, dizziness. EXAM: CT ANGIOGRAPHY HEAD AND NECK WITH AND WITHOUT CONTRAST TECHNIQUE: Multidetector CT imaging of the head and neck was performed using the standard protocol during bolus administration of intravenous contrast. Multiplanar CT image reconstructions and MIPs were obtained to evaluate the vascular anatomy. Carotid stenosis measurements (when applicable) are obtained utilizing NASCET criteria, using the distal internal carotid diameter as the denominator. RADIATION DOSE REDUCTION: This exam was performed according to the departmental dose-optimization program which includes automated exposure control,  adjustment of the mA and/or kV according to patient size and/or use of iterative reconstruction technique. CONTRAST:  85mL OMNIPAQUE IOHEXOL 350 MG/ML SOLN COMPARISON:  Head CT 05/06/2023.  Brain MRI 06/29/2022. Neck CT 05/06/2023. FINDINGS: CT HEAD FINDINGS Brain: Chronic left temporal lobe encephalomalacia with dystrophic calcifications. Stable Jonda Alanis-white matter differentiation throughout the brain. Stable cerebral volume. No midline shift, ventriculomegaly, mass effect, evidence of mass lesion, intracranial hemorrhage or evidence of cortically based acute infarction. Amish Mintzer-white matter differentiation is within normal limits throughout the brain. Stretch that Vascular: No suspicious intracranial vascular hyperdensity. Calcified atherosclerosis at the skull base. With Skull: Stable, intact. Sinuses/Orbits: Tympanic cavities, Visualized paranasal sinuses and mastoids are stable and well aerated. Other: Visualized orbits and scalp soft tissues are within normal limits. CTA NECK Skeleton: Absent dentition. Widespread cervical spine degeneration. No acute osseous abnormality identified. Upper chest: Negative. Other neck: Nonvascular neck soft tissue spaces are within normal limits. Aortic arch: Calcified aortic atherosclerosis.  3 vessel arch. Right carotid system: Brachiocephalic artery, right CCA origin calcified plaque. No significant stenosis, proximal right CCA detail degraded by dense subclavian venous contrast streak artifact. Right carotid bifurcation plaque with moderate to severe combined soft and calcified plaque of the proximal right ICA. Subsequent stenosis maximal at the distal bulb is high-grade and 65 to 70 % with respect to the distal vessel (series 9, image 198, series 10, image 150). The vessel remains patent with mild additional calcified plaque and also tortuosity below the skull base. Numerically estimated at Left carotid system: Calcified left CCA origin plaque with 50 % stenosis with respect to  the distal vessel. Calcified plaque at the left carotid bifurcation, ICA origin and bulb also resulting in 50 % stenosis with respect to the distal vessel. Tortuosity distal to the stenosis. Vertebral arteries: Right subclavian origin calcified plaque without stenosis. Calcified right vertebral artery origin, and occluded right vertebral origin or V1 segment. Highly diminutive right vertebral artery on February neck CT, and diminished distal right vertebral artery flow void on brain MRI  last year, as well as a brain MRI 03/08/2014. No significant reconstituted enhancement by CTA today. Calcified proximal left subclavian artery plaque without significant stenosis. Patent left vertebral artery origin. Left vertebral artery is patent with tortuosity in the neck, no stenosis to the skull base. CTA HEAD Posterior circulation: Distal left vertebral artery is patent and supplies the basilar. There is evidence of retrograde enhancement of the distal right vertebral artery and right PICA origin both of which appear atherosclerotic. No left V4 stenosis despite plaque. There is moderate proximal basilar artery plaque and stenosis series 13, image 27. Mild distal basilar irregularity. SCA and right PCA origin remain patent. Fetal type left PCA origin. Right posterior communicating artery diminutive or absent. Bilateral PCA branches are patent although irregular. Moderate left P2 and P3 segment stenosis. Mild right P2 segment stenosis. Anterior circulation: Both ICA siphons are patent. Cavernous left ICA calcified plaque and stenosis is moderate. Similar moderate left ICA supraclinoid plaque and stenosis. Normal left posterior communicating artery origin. Right ICA siphon calcified plaque with severe supraclinoid stenosis series 11, image 117. Distal right ICA remains patent. Patent carotid termini. Patent MCA and ACA origins. But severe left ACA origin stenosis series 13, image 32. Anterior communicating artery and other ACA  branches are patent. There is moderate irregularity and stenosis of the right ACA A2 on series 14, image 24. Left MCA M1 segment is patent with mild to moderate irregularity and stenosis (series 12, image 24). Patent left MCA bifurcation. Mild left MCA branch irregularity without occlusion. Contralateral right MCA M1 segment is patent with mild irregularity. Patent right MCA branches with mild irregularity. Venous sinuses: Patent. Anatomic variants: Fetal type left PCA origin. Review of the MIP images confirms the above findings IMPRESSION: 1. Negative for emergent large vessel occlusion. Positive for advanced atherosclerosis in the head and neck, most notable for: - chronic Right Vertebral Artery poor flow or occlusion. - Moderate stenosis proximal Basilar artery. - Tandem High-grade stenoses of the Right ICA: 65-70% Right ICA bulb stenosis and Severe supraclinoid Right ICA siphon stenosis. - Severe stenosis Left ACA origin. - 50% stenosis Left CCA origin and Left ICA bulb. - up to Moderate stenoses of the distal Left PCA, Right ACA A2, and Left MCA M1 segments. 2. No acute intracranial abnormality by CT. Chronic left temporal lobe encephalomalacia. 3.  Aortic Atherosclerosis (ICD10-I70.0). Electronically Signed   By: Marlise Simpers M.D.   On: 09/19/2023 11:33   CT ABDOMEN PELVIS W CONTRAST Result Date: 09/19/2023 CLINICAL DATA:  Dizzy this morning. History of vertigo. Prostate cancer. Seizures. Abdominal pain. EXAM: CT ABDOMEN AND PELVIS WITH CONTRAST TECHNIQUE: Multidetector CT imaging of the abdomen and pelvis was performed using the standard protocol following bolus administration of intravenous contrast. RADIATION DOSE REDUCTION: This exam was performed according to the departmental dose-optimization program which includes automated exposure control, adjustment of the mA and/or kV according to patient size and/or use of iterative reconstruction technique. CONTRAST:  85mL OMNIPAQUE IOHEXOL 350 MG/ML SOLN  COMPARISON:  02/07/2023. FINDINGS: Lower chest: Emphysema. Mild cardiomegaly, without pericardial or pleural effusion. Multivessel coronary artery atherosclerosis. Mild right hemidiaphragm elevation. Hepatobiliary: Normal liver. Dependent 21 mm gallstone without acute cholecystitis or biliary duct dilatation. Pancreas: Normal, without mass or ductal dilatation. Spleen: Normal in size, without focal abnormality. Adrenals/Urinary Tract: Normal adrenal glands. Upper pole left renal 2 mm lesion is too small to characterize but most likely a cyst . In the absence of clinically indicated signs/symptoms require(s) no independent follow-up. No hydronephrosis. The  bladder is mildly thick walled with pericystic edema including on 86/7. Dependent density within the bladder is most likely early contrast excretion including on 87/7. Stomach/Bowel: Normal stomach, without wall thickening. Scattered colonic diverticula. Normal terminal ileum. Appendix not visualized. Normal small bowel. Vascular/Lymphatic: Aortic atherosclerosis. No abdominopelvic adenopathy. Reproductive: Mild prostatomegaly.  Radiation seeds within. Other: No significant free fluid. No free intraperitoneal air. Small fat containing left inguinal hernia. Musculoskeletal: L4-5 fixation. L3 sclerotic lesion is present back to at least 2018 and can be presumed a bone island. IMPRESSION: 1. Bladder wall thickening with new pericystic edema, suspicious for cystitis. 2. No other acute process. 3. Hyperattenuation in the dependent bladder is favored to represent early contrast excretion. A new bladder stone since 02/07/2023 is felt less likely. 4. Cholelithiasis 5. Coronary artery atherosclerosis. Aortic Atherosclerosis (ICD10-I70.0). Emphysema (ICD10-J43.9). Electronically Signed   By: Lore Rode M.D.   On: 09/19/2023 11:12   DG Chest Port 1 View Result Date: 09/19/2023 CLINICAL DATA:  Sepsis EXAM: PORTABLE CHEST 1 VIEW COMPARISON:  Chest radiograph dated  05/15/2005 FINDINGS: Normal lung volumes. No focal consolidations. No pleural effusion or pneumothorax. The heart size and mediastinal contours are within normal limits. No acute osseous abnormality. IMPRESSION: No acute disease. Electronically Signed   By: Limin  Xu M.D.   On: 09/19/2023 09:39    Pertinent labs & imaging results that were available during my care of the patient were reviewed by me and considered in my medical decision making (see MDM for details).  Medications Ordered in ED Medications  lactated ringers infusion ( Intravenous New Bag/Given 09/19/23 1325)  lactated ringers bolus 1,000 mL (0 mLs Intravenous Stopped 09/19/23 1121)  meclizine  (ANTIVERT ) tablet 25 mg (25 mg Oral Given 09/19/23 0904)  acetaminophen  (TYLENOL ) tablet 1,000 mg (1,000 mg Oral Given 09/19/23 0904)  iohexol (OMNIPAQUE) 350 MG/ML injection 100 mL (85 mLs Intravenous Contrast Given 09/19/23 1011)  cefTRIAXone  (ROCEPHIN ) 2 g in sodium chloride  0.9 % 100 mL IVPB (0 g Intravenous Stopped 09/19/23 1305)                                                                                                                                     Procedures .Critical Care  Performed by: Teddi Favors, DO Authorized by: Teddi Favors, DO   Critical care provider statement:    Critical care time (minutes):  30   Critical care time was exclusive of:  Separately billable procedures and treating other patients   Critical care was necessary to treat or prevent imminent or life-threatening deterioration of the following conditions:  Sepsis   Critical care was time spent personally by me on the following activities:  Development of treatment plan with patient or surrogate, discussions with consultants, evaluation of patient's response to treatment, examination of patient, ordering and review of laboratory studies, ordering and review of radiographic studies, ordering and performing treatments and interventions, pulse oximetry,  re-evaluation  of patient's condition, review of old charts and obtaining history from patient or surrogate   Care discussed with: admitting provider     (including critical care time)  Medical Decision Making / ED Course    Medical Decision Making:    CATALINO PLASCENCIA Flores is a 78 y.o. male with past medical history as below, significant for complex partial seizure disorder, HLD, migraine headaches, vertigo, obesity, prostate cancer who presents to the ED with complaint of dizziness. The complaint involves an extensive differential diagnosis and also carries with it a high risk of complications and morbidity.  Serious etiology was considered. Ddx includes but is not limited to: peripheral/central vertigo, cva, tia, infectious, viral syndrome, dehydration, community-acquired pneumonia, urinary tract infection, acute cholecystitis, viral syndrome, cellulitis, tick bourne disease,  decubitus ulcer, necrotizing fasciitis, meningitis, encephalitis, influenza, etc.   Complete initial physical exam performed, notably the patient was in no distress, febrile.    Reviewed and confirmed nursing documentation for past medical history, family history, social history.  Vital signs reviewed.     Brief summary:  78 yo/m w/ hx as above here with dizziness, dysarthria, fever Dysarthria resolved, dizziness persistent, fever new this am   Clinical Course as of 09/19/23 1449  Thu Sep 19, 2023  1035 Bacteria, UA: NONE SEEN Sterile pyuria [SG]  1144 CT concerning for cystitis  [SG]  1224 Feeling better, dizzy sensation improved, able to stand at side of bed unassisted. Rpt neuro exam is non-focal [SG]  1326 Carbamazepine  (Tegretol ), S(!!): 16.1 [SG]  1333 Admit TRH [SG]  1449 Temp(!): 103 F (39.4 C) Fever returned, give ibuprofen   [SG]    Clinical Course User Index [SG] Russella Courts A, DO   Vertiginous dizziness sensation has improved  He has acute cystitis, likely source infection.  Code sepsis was  activated.  He is not in septic shock.  He is HDS.  He was given Rocephin , cultures were obtained.  Patient with transient dysarthria and weakness this morning which has since resolved.  Concern for possible TIA although metabolic component is very likely.  Recommend admission for sepsis secondary urinary tract infection and for possible TIA.  Patient is agreeable to plan  Admit TR[facility]             Additional history obtained: -Additional history obtained from spouse -External records from outside source obtained and reviewed including: Chart review including previous notes, labs, imaging, consultation notes including  Primary care documentation, prior ER visit, home medications Follows with [facility] for seizures, encephalomalacia, vertigo   Lab Tests: -I ordered, reviewed, and interpreted labs.   The pertinent results include:   Labs Reviewed  COMPREHENSIVE METABOLIC PANEL WITH GFR - Abnormal; Notable for the following components:      Result Value   Glucose, Bld 135 (*)    Creatinine, Ser 1.30 (*)    GFR, Estimated 57 (*)    Anion gap 16 (*)    All other components within normal limits  CBC WITH DIFFERENTIAL/PLATELET - Abnormal; Notable for the following components:   WBC 11.9 (*)    Neutro Abs 9.0 (*)    Monocytes Absolute 1.7 (*)    All other components within normal limits  URINALYSIS, W/ REFLEX TO CULTURE (INFECTION SUSPECTED) - Abnormal; Notable for the following components:   APPearance HAZY (*)    Hgb urine dipstick MODERATE (*)    Protein, ur 100 (*)    Nitrite POSITIVE (*)    Leukocytes,Ua LARGE (*)    All  other components within normal limits  LIPASE, BLOOD - Abnormal; Notable for the following components:   Lipase 10 (*)    All other components within normal limits  CARBAMAZEPINE  LEVEL, TOTAL - Abnormal; Notable for the following components:   Carbamazepine  Lvl 16.1 (*)    All other components within normal limits  RESP PANEL BY RT-PCR (RSV,  FLU A&B, COVID)  RVPGX2  CULTURE, BLOOD (ROUTINE X 2)  CULTURE, BLOOD (ROUTINE X 2)  URINE CULTURE  LACTIC ACID, PLASMA  PROTIME-INR  LACTIC ACID, PLASMA    Notable for as above, carbamazepine  elev  EKG   EKG Interpretation Date/Time:  Thursday September 19 2023 08:15:25 EDT Ventricular Rate:  100 PR Interval:  198 QRS Duration:  102 QT Interval:  320 QTC Calculation: 413 R Axis:   70  Text Interpretation: Sinus tachycardia Borderline repolarization abnormality Confirmed by Russella Courts ([id number]) on 09/19/2023 8:42:36 AM         Imaging Studies ordered: I ordered imaging studies including CTA head neck, ct a/p, cxr I independently visualized the following imaging with scope of interpretation limited to determining acute life threatening conditions related to emergency care; findings noted above I agree with the radiologist interpretation If any imaging was obtained with contrast I closely monitored patient for any possible adverse reaction a/w contrast administration in the emergency department   Medicines ordered and prescription drug management: Meds ordered this encounter  Medications  . lactated ringers bolus 1,000 mL  . meclizine  (ANTIVERT ) tablet 25 mg  . acetaminophen  (TYLENOL ) tablet 1,000 mg  . iohexol (OMNIPAQUE) 350 MG/ML injection 100 mL  . cefTRIAXone  (ROCEPHIN ) 2 g in sodium chloride  0.9 % 100 mL IVPB    Antibiotic Indication::   UTI  . lactated ringers infusion    -I have reviewed the patients home medicines and have made adjustments as needed   Consultations Obtained: I requested consultation with the [facility],  and discussed lab and imaging findings as well as pertinent plan    Cardiac Monitoring: The patient was maintained on a cardiac monitor.  I personally viewed and interpreted the cardiac monitored which showed an underlying rhythm of: nsr Continuous pulse oximetry interpreted by myself, 98% on RA.    Social Determinants of Health:  Diagnosis or  treatment significantly limited by social determinants of health: former smoker   Reevaluation: After the interventions noted above, I reevaluated the patient and found that they have improved  Co morbidities that complicate the patient evaluation . Past Medical History:  Diagnosis Date  . Cervical radiculopathy   . Colon polyp    (2006- adenoma and tubulovillous adenoma; 2010-adenomas; repeat 201)-Dr. Lincoln Renshaw  . Complex partial seizure disorder (HCC) 10/08/2014  . GERD (gastroesophageal reflux disease)   . Gout   . Hypercholesterolemia   . Lateral epicondylitis    2002  . Migraine    headaches  . Ophthalmalgia    Dr. Gennie Kicks  . Orthodontics    Dr. Armandina Bernard  . Partial epilepsy with impairment of consciousness (HCC)   . Plantar fasciitis   . Prostate cancer (HCC)   . Seizure disorder (HCC)    Dr. Annabell Key ([facility])  . Seizures (HCC)   . UTI (urinary tract infection)   . Vertebrobasilar insufficiency   . Vertigo       Dispostion: Disposition decision including need for hospitalization was considered, and patient admitted to the hospital.    Final Clinical Impression(s) / ED Diagnoses Final diagnoses:  Sepsis, due to unspecified organism, unspecified whether acute  organ dysfunction present (HCC)  Acute cystitis with hematuria  Altered mental status, unspecified altered mental status type        Teddi Favors, DO 09/19/23 1336

## 2023-09-19 NOTE — Plan of Care (Signed)

## 2023-09-19 NOTE — ED Notes (Signed)
 [facility] cell [phone]. Brandon Flores [phone].

## 2023-09-19 NOTE — Care Plan (Signed)
 Plan of Care Note for accepted transfer   Patient: Brandon Flores MRN: [id number]   DOA: 09/19/2023  Fac[facility] transfer: [facility] Requesting Provider: Dr Martina Sledge Reason for transfer: Concern for TIA and sepsis secondary to UTI Facility course:  Patient is a 78 year old gentleman history of complex partial seizure disorder, migraine headaches, vertigo, hyperlipidemia, prostate cancer presented to the ED with initial complaints of dizziness.  Patient noted to have woken up around 3 AM to go to the bathroom had severe dizziness with spinning sensation similar to history of typical vertigo with difficulty ambulating.  Unable to get back in bed and laid on the floor for over 5 hours.  Patient also noted to have some dysarthric and slurred speech that subsequently resolved.  Patient also noted with dysuria and increased urinary frequency.  Workup in the ED concerning for sepsis secondary to UTI.  Also concern for TIA due to patient's symptoms.  CT angiogram head and neck done with negative LVO, positive advanced atherosclerosis in the head and neck with chronic right vertebral artery poor flow or occlusion, high-grade stenosis of the right ICA, severe stenosis of left ACA.  Patient pancultured and placed on IV antibiotics. Will likely need MRI of brain for further evaluation and probable neurology input.  Plan of care: The patient is accepted for admission to Telemetry unit, at [facility]..   Author: Hilda Lovings, MD 09/19/2023  Check [email or web address] for on-call coverage.  Nursing staff, Please call [facility] Admits & Consults System-Wide number on Amion as soon as patient's arrival, so appropriate admitting provider can evaluate the pt.

## 2023-09-19 NOTE — ED Notes (Signed)
 Pt used urinal to CT

## 2023-09-19 NOTE — ED Notes (Signed)
 Called [facility] at [facility] for transport

## 2023-09-19 NOTE — ED Notes (Signed)
 Report to Sherrlyn Dolores RN nurse for pt Brandon Flores , informed that his phone is wrapped in a [facility] belongings bag in the same bag as his shoes ans belt

## 2023-09-19 NOTE — H&P (Addendum)
 History and Physical   Brandon [name]:[id number] DOB: 12-14-1945 DOA: 09/19/2023  PCP: Rey Catholic, MD   Patient coming from: Home  Chief Complaint: Dizziness  HPI: Brandon Flores is a 78 y.o. male with medical history significant of hyperlipidemia, GERD, hypothyroidism, gout, prostate cancer, obesity, epilepsy, vertebral basilar insufficiency syndrome, encephalomalacia presenting with dizziness and slurred speech.  Patient reported around 3 AM he went to the bathroom and noted dizziness similar to his prior vertigo described as a spinning sensation.  He was unable to get back into his bed due to the severity of the vertigo and ended up sleeping on the floor.  During this episode he noted significant weakness and some slurred speech.  Woke up with continued vertigo but improvement in speech.  EMS was called and patient brought to the ED for further evaluation.  Symptoms have further improved in the ED.  Patient and family relate that he had an episode a year ago that was similar with vertigo and some weakness.  Has had recurrent episodes of vertigo sometimes with weakness.  Never had weakness as severe as today and never had slurred speech.  Has followed up with neurology and had normal MRI in 2024.  Denies fevers, chills, chest pain, shortness of breath, abdominal pain, constipation, diarrhea, nausea, vomiting.  ED Course: Vital signs in the ED notable for fever to 102, heart rate in the 80s-110s, respirate in the teens-30s, blood pressure in the 120s-150 systolic.  Lab workup included CMP with creatinine 1.3 near baseline of 1-1.1, glucose 135, anion gap 16 with normal bicarb.  CBC with leukocytosis to 11.9.  PT and INR normal.  Lactic acid normal with repeat pending.  Lipase normal.  Respiratory panel for flu COVID RSV negative.  Urinalysis with protein, nitrates, leukocytes.  Urine culture pending.  Blood culture pending.  Chest x-ray without acute normality.  CT a of the head and  neck showed no acute abnormality but did show chronic vertebrobasilar insufficiency with right ICA stenosis which was severe, severe left ACA stenosis, 50% left carotid stenosis, moderate left PCA stenosis.  Patient received ceftriaxone , Tylenol , ibuprofen , IV fluids in the ED.  Review of Systems: As per HPI otherwise all other systems reviewed and are negative.  Past Medical History:  Diagnosis Date   Cervical radiculopathy    Colon polyp    (2006- adenoma and tubulovillous adenoma; 2010-adenomas; repeat 201)-Dr. Lincoln Renshaw   Complex partial seizure disorder (HCC) 10/08/2014   GERD (gastroesophageal reflux disease)    Gout    Hypercholesterolemia    Lateral epicondylitis    2002   Migraine    headaches   Ophthalmalgia    Dr. Gennie Kicks   Orthodontics    Dr. Armandina Bernard   Partial epilepsy with impairment of consciousness (HCC)    Plantar fasciitis    Prostate cancer (HCC)    Seizure disorder (HCC)    Dr. Annabell Key ([facility])   Seizures [facility])    UTI (urinary tract infection)    Vertebrobasilar insufficiency    Vertigo     Past Surgical History:  Procedure Laterality Date   BACK SURGERY     HEMORROIDECTOMY     NECK SURGERY     PROSTATE BIOPSY      Social History  reports that he quit smoking about 37 years ago. His smoking use included cigarettes. He started smoking about 57 years ago. He has a 60 pack-year smoking history. He has never used smokeless tobacco. He reports that he does  not drink alcohol and does not use drugs.  No Known Allergies  Family History  Problem Relation Age of Onset   Alcohol abuse Father    COPD Sister    Lung cancer Sister    Cancer Sister    Lung cancer Brother    Cancer Brother    Cancer Sister        lung   Lung cancer Sister    Lung cancer Brother    Cancer Brother    Seizures Neg Hx    Colon cancer Neg Hx    Esophageal cancer Neg Hx    Rectal cancer Neg Hx    Stomach cancer Neg Hx    Breast cancer Neg Hx    Prostate cancer Neg Hx    Pancreatic  cancer Neg Hx   Reviewed on admission  Prior to Admission medications   Medication Sig Start Date End Date Taking? Authorizing Provider  carbamazepine  (TEGRETOL ) 200 MG tablet Take 2 tablets (400 mg total) by mouth 3 (three) times daily. WILL TRY SWITCH TO GENERIC 06/13/23  Yes Camara, Nancye Azure, MD  levothyroxine  (SYNTHROID ) 112 MCG tablet Take 112 mcg by mouth daily. 09/17/23  Yes [provider]  allopurinol  (ZYLOPRIM ) 100 MG tablet Take 1 tablet (100 mg total) by mouth daily. 08/19/20   Hilts, Bambi Lever, MD  dutasteride  (AVODART ) 0.5 MG capsule Take 1 capsule (0.5 mg total) by mouth every evening. 08/19/20   Hilts, Bambi Lever, MD  levothyroxine  (SYNTHROID ) 88 MCG tablet Take 1 tablet (88 mcg total) by mouth daily before breakfast. 08/22/20   Hilts, Bambi Lever, MD  meclizine  (ANTIVERT ) 25 MG tablet Take 1 tablet (25 mg total) by mouth 3 (three) times daily as needed for dizziness. 06/08/19   Hilts, Bambi Lever, MD  meloxicam (MOBIC) 15 MG tablet Take 15 mg by mouth daily. 06/19/19   [provider]  OVER THE COUNTER MEDICATION Stool softener with probiotic twice a week.    [provider]  oxyCODONE -acetaminophen  (PERCOCET/ROXICET) 5-325 MG tablet Take 1 tablet by mouth every 8 (eight) hours as needed for severe pain. 11/18/22   Spence Dux, PA-C  tamsulosin  (FLOMAX ) 0.4 MG CAPS capsule Take 2 capsules (0.8 mg total) by mouth daily after supper. 08/19/20   Hilts, Bambi Lever, MD    Physical Exam: Vitals:   09/19/23 1400 09/19/23 1401 09/19/23 1430 09/19/23 1451  BP: (!) 151/76  (!) 145/84   Pulse: (!) 112  (!) 107   Resp: (!) 32  (!) 32   Temp:  (!) 103 F (39.4 C)  (!) 102.1 F (38.9 C)  TempSrc:  Oral  Oral  SpO2: 93%  95%     Physical Exam Constitutional:      General: He is not in acute distress.    Appearance: Normal appearance.  HENT:     Head: Normocephalic and atraumatic.     Mouth/Throat:     Mouth: Mucous membranes are moist.     Pharynx: Oropharynx is clear.    Eyes:     Extraocular Movements: Extraocular movements intact.     Pupils: Pupils are equal, round, and reactive to light.    Cardiovascular:     Rate and Rhythm: Normal rate and regular rhythm.     Pulses: Normal pulses.     Heart sounds: Normal heart sounds.  Pulmonary:     Effort: Pulmonary effort is normal. No respiratory distress.     Breath sounds: Normal breath sounds.  Abdominal:     General: Bowel sounds are normal.  There is no distension.     Palpations: Abdomen is soft.     Tenderness: There is no abdominal tenderness.   Musculoskeletal:        General: No swelling or deformity.   Skin:    General: Skin is warm and dry.   Neurological:     Comments: Mental Status: Patient is awake, alert, oriented No signs of aphasia or neglect Cranial Nerves: II: Pupils equal, round, and reactive to light.   III,IV, VI: EOMI without ptosis, +nystagmus.  V: Facial sensation is symmetric to light touch  VII: Facial movement is symmetric.  VIII: hearing is intact to voice X: Uvula elevates symmetrically XI: Shoulder shrug is symmetric. XII: tongue is midline without atrophy or fasciculations.  Motor: Good effort thorughout, at Least 5/5 bilateral UE, 5/5 bilateral lower extremitiy  Sensory: Sensation is grossly intact bilateral UEs & LEs Cerebellar: Finger-Nose intact bilat    Labs on Admission: I have personally reviewed following labs and imaging studies  CBC: Recent Labs  Lab 09/19/23 0900  WBC 11.9*  NEUTROABS 9.0*  HGB 14.4  HCT 41.6  MCV 91.0  PLT 199    Basic Metabolic Panel: Recent Labs  Lab 09/19/23 0900  NA 140  K 3.6  CL 101  CO2 23  GLUCOSE 135*  BUN 21  CREATININE 1.30*  CALCIUM 9.3    GFR: CrCl cannot be calculated (Unknown ideal weight.).  Liver Function Tests: Recent Labs  Lab 09/19/23 0900  AST 18  ALT 15  ALKPHOS 87  BILITOT 1.1  PROT 7.5  ALBUMIN 3.9    Urine analysis:    Component Value Date/Time   COLORURINE  YELLOW 09/19/2023 0900   APPEARANCEUR HAZY (A) 09/19/2023 0900   LABSPEC 1.013 09/19/2023 0900   PHURINE 6.0 09/19/2023 0900   GLUCOSEU NEGATIVE 09/19/2023 0900   HGBUR MODERATE (A) 09/19/2023 0900   BILIRUBINUR NEGATIVE 09/19/2023 0900   KETONESUR NEGATIVE 09/19/2023 0900   PROTEINUR 100 (A) 09/19/2023 0900   UROBILINOGEN 0.2 03/08/2014 1211   NITRITE POSITIVE (A) 09/19/2023 0900   LEUKOCYTESUR LARGE (A) 09/19/2023 0900    Radiological Exams on Admission: CT ANGIO HEAD NECK W WO CM Result Date: 09/19/2023 CLINICAL DATA:  78 year old male with TIA, dizziness. EXAM: CT ANGIOGRAPHY HEAD AND NECK WITH AND WITHOUT CONTRAST TECHNIQUE: Multidetector CT imaging of the head and neck was performed using the standard protocol during bolus administration of intravenous contrast. Multiplanar CT image reconstructions and MIPs were obtained to evaluate the vascular anatomy. Carotid stenosis measurements (when applicable) are obtained utilizing NASCET criteria, using the distal internal carotid diameter as the denominator. RADIATION DOSE REDUCTION: This exam was performed according to the departmental dose-optimization program which includes automated exposure control, adjustment of the mA and/or kV according to patient size and/or use of iterative reconstruction technique. CONTRAST:  85mL OMNIPAQUE IOHEXOL 350 MG/ML SOLN COMPARISON:  Head CT 05/06/2023.  Brain MRI 06/29/2022. Neck CT 05/06/2023. FINDINGS: CT HEAD FINDINGS Brain: Chronic left temporal lobe encephalomalacia with dystrophic calcifications. Stable gray-white matter differentiation throughout the brain. Stable cerebral volume. No midline shift, ventriculomegaly, mass effect, evidence of mass lesion, intracranial hemorrhage or evidence of cortically based acute infarction. Gray-white matter differentiation is within normal limits throughout the brain. Stretch that Vascular: No suspicious intracranial vascular hyperdensity. Calcified atherosclerosis at  the skull base. With Skull: Stable, intact. Sinuses/Orbits: Tympanic cavities, Visualized paranasal sinuses and mastoids are stable and well aerated. Other: Visualized orbits and scalp soft tissues are within normal limits. CTA NECK Skeleton:  Absent dentition. Widespread cervical spine degeneration. No acute osseous abnormality identified. Upper chest: Negative. Other neck: Nonvascular neck soft tissue spaces are within normal limits. Aortic arch: Calcified aortic atherosclerosis.  3 vessel arch. Right carotid system: Brachiocephalic artery, right CCA origin calcified plaque. No significant stenosis, proximal right CCA detail degraded by dense subclavian venous contrast streak artifact. Right carotid bifurcation plaque with moderate to severe combined soft and calcified plaque of the proximal right ICA. Subsequent stenosis maximal at the distal bulb is high-grade and 65 to 70 % with respect to the distal vessel (series 9, image 198, series 10, image 150). The vessel remains patent with mild additional calcified plaque and also tortuosity below the skull base. Numerically estimated at Left carotid system: Calcified left CCA origin plaque with 50 % stenosis with respect to the distal vessel. Calcified plaque at the left carotid bifurcation, ICA origin and bulb also resulting in 50 % stenosis with respect to the distal vessel. Tortuosity distal to the stenosis. Vertebral arteries: Right subclavian origin calcified plaque without stenosis. Calcified right vertebral artery origin, and occluded right vertebral origin or V1 segment. Highly diminutive right vertebral artery on February neck CT, and diminished distal right vertebral artery flow void on brain MRI last year, as well as a brain MRI 03/08/2014. No significant reconstituted enhancement by CTA today. Calcified proximal left subclavian artery plaque without significant stenosis. Patent left vertebral artery origin. Left vertebral artery is patent with tortuosity in  the neck, no stenosis to the skull base. CTA HEAD Posterior circulation: Distal left vertebral artery is patent and supplies the basilar. There is evidence of retrograde enhancement of the distal right vertebral artery and right PICA origin both of which appear atherosclerotic. No left V4 stenosis despite plaque. There is moderate proximal basilar artery plaque and stenosis series 13, image 27. Mild distal basilar irregularity. SCA and right PCA origin remain patent. Fetal type left PCA origin. Right posterior communicating artery diminutive or absent. Bilateral PCA branches are patent although irregular. Moderate left P2 and P3 segment stenosis. Mild right P2 segment stenosis. Anterior circulation: Both ICA siphons are patent. Cavernous left ICA calcified plaque and stenosis is moderate. Similar moderate left ICA supraclinoid plaque and stenosis. Normal left posterior communicating artery origin. Right ICA siphon calcified plaque with severe supraclinoid stenosis series 11, image 117. Distal right ICA remains patent. Patent carotid termini. Patent MCA and ACA origins. But severe left ACA origin stenosis series 13, image 32. Anterior communicating artery and other ACA branches are patent. There is moderate irregularity and stenosis of the right ACA A2 on series 14, image 24. Left MCA M1 segment is patent with mild to moderate irregularity and stenosis (series 12, image 24). Patent left MCA bifurcation. Mild left MCA branch irregularity without occlusion. Contralateral right MCA M1 segment is patent with mild irregularity. Patent right MCA branches with mild irregularity. Venous sinuses: Patent. Anatomic variants: Fetal type left PCA origin. Review of the MIP images confirms the above findings IMPRESSION: 1. Negative for emergent large vessel occlusion. Positive for advanced atherosclerosis in the head and neck, most notable for: - chronic Right Vertebral Artery poor flow or occlusion. - Moderate stenosis proximal  Basilar artery. - Tandem High-grade stenoses of the Right ICA: 65-70% Right ICA bulb stenosis and Severe supraclinoid Right ICA siphon stenosis. - Severe stenosis Left ACA origin. - 50% stenosis Left CCA origin and Left ICA bulb. - up to Moderate stenoses of the distal Left PCA, Right ACA A2, and Left MCA M1 segments.  2. No acute intracranial abnormality by CT. Chronic left temporal lobe encephalomalacia. 3.  Aortic Atherosclerosis (ICD10-I70.0). Electronically Signed   By: Marlise Simpers M.D.   On: 09/19/2023 11:33   CT ABDOMEN PELVIS W CONTRAST Result Date: 09/19/2023 CLINICAL DATA:  Dizzy this morning. History of vertigo. Prostate cancer. Seizures. Abdominal pain. EXAM: CT ABDOMEN AND PELVIS WITH CONTRAST TECHNIQUE: Multidetector CT imaging of the abdomen and pelvis was performed using the standard protocol following bolus administration of intravenous contrast. RADIATION DOSE REDUCTION: This exam was performed according to the departmental dose-optimization program which includes automated exposure control, adjustment of the mA and/or kV according to patient size and/or use of iterative reconstruction technique. CONTRAST:  85mL OMNIPAQUE IOHEXOL 350 MG/ML SOLN COMPARISON:  02/07/2023. FINDINGS: Lower chest: Emphysema. Mild cardiomegaly, without pericardial or pleural effusion. Multivessel coronary artery atherosclerosis. Mild right hemidiaphragm elevation. Hepatobiliary: Normal liver. Dependent 21 mm gallstone without acute cholecystitis or biliary duct dilatation. Pancreas: Normal, without mass or ductal dilatation. Spleen: Normal in size, without focal abnormality. Adrenals/Urinary Tract: Normal adrenal glands. Upper pole left renal 2 mm lesion is too small to characterize but most likely a cyst . In the absence of clinically indicated signs/symptoms require(s) no independent follow-up. No hydronephrosis. The bladder is mildly thick walled with pericystic edema including on 86/7. Dependent density within the  bladder is most likely early contrast excretion including on 87/7. Stomach/Bowel: Normal stomach, without wall thickening. Scattered colonic diverticula. Normal terminal ileum. Appendix not visualized. Normal small bowel. Vascular/Lymphatic: Aortic atherosclerosis. No abdominopelvic adenopathy. Reproductive: Mild prostatomegaly.  Radiation seeds within. Other: No significant free fluid. No free intraperitoneal air. Small fat containing left inguinal hernia. Musculoskeletal: L4-5 fixation. L3 sclerotic lesion is present back to at least 2018 and can be presumed a bone island. IMPRESSION: 1. Bladder wall thickening with new pericystic edema, suspicious for cystitis. 2. No other acute process. 3. Hyperattenuation in the dependent bladder is favored to represent early contrast excretion. A new bladder stone since 02/07/2023 is felt less likely. 4. Cholelithiasis 5. Coronary artery atherosclerosis. Aortic Atherosclerosis (ICD10-I70.0). Emphysema (ICD10-J43.9). Electronically Signed   By: Lore Rode M.D.   On: 09/19/2023 11:12   DG Chest Port 1 View Result Date: 09/19/2023 CLINICAL DATA:  Sepsis EXAM: PORTABLE CHEST 1 VIEW COMPARISON:  Chest radiograph dated 05/15/2005 FINDINGS: Normal lung volumes. No focal consolidations. No pleural effusion or pneumothorax. The heart size and mediastinal contours are within normal limits. No acute osseous abnormality. IMPRESSION: No acute disease. Electronically Signed   By: Limin  Xu M.D.   On: 09/19/2023 09:39   EKG: Independently reviewed. Sinus tachycardia at 100 bpm.  Nonspecific T wave changes.  Assessment/Plan Principal Problem:   Sepsis (HCC) Active Problems:   GERD (gastroesophageal reflux disease)   Gout   VBI (vertebrobasilar insufficiency)   Generalized convulsive epilepsy (HCC)   HLD (hyperlipidemia)   Obesity (BMI 30-39.9)   Malignant neoplasm of prostate (HCC)   Acute cystitis with hematuria   TIA (transient ischemic attack)   TIA > Patient  presenting with vertigo which is typical but also an episode of slurred speech which was atypical. > Symptoms improved in the ED.  Did have some episode of unintelligible speech per med rec comment. > On my exam symptoms improved, mild nystagmus. > Known history of vertebrobasilar insufficiency syndrome with imaging here showing significant chronic disease of the vertebral basilar circulation on CTA head and neck. > No acute abnormality on CT head. - Discussed with neuro, reconsult if MRI positive - Allow  for permissive HTN (systolic < 220 and diastolic < 120)  - ASA daily  - Statin pending Lipid panel  - Echocardiogram  - MR Brain - A1C  - Lipid panel  - Tele monitoring  - SLP eval - PT/OT  UTI > Found to have evidence of UTI in the ED with protein, nitrates, leukocytes in urine.  No bacteria but also has fever to 102.  Leukocytosis to 11.9. > Urine culture and blood cultures obtained and are pending. > Received ceftriaxone  in the ED. - Continue ceftriaxone  - Trend fever curve and WBC - Follow-up urine culture and blood culture  Hypothyroidism - Continue home Synthroid   History of vertigo - Continue meclizine   Epilepsy - Continue home Tegretol   Gout - Continue home allopurinol   DVT prophylaxis: SCDs for now Code Status:   Full Family Communication:  Updated at bedside  Disposition Plan:   Patient is from:  Home  Anticipated DC to:  Home  Anticipated DC date:  1 to 3 days  Anticipated DC barriers: None  Consults called:  Discussed with neuro, reconsult if MRI positive Admission status:  Observation, telemetry  Severity of Illness: The appropriate patient status for this patient is OBSERVATION. Observation status is judged to be reasonable and necessary in order to provide the required intensity of service to ensure the patient's safety. The patient's presenting symptoms, physical exam findings, and initial radiographic and laboratory data in the context of their  medical condition is felt to place them at decreased risk for further clinical deterioration. Furthermore, it is anticipated that the patient will be medically stable for discharge from the hospital within 2 midnights of admission.    Johnetta Nab MD [facility]  How to contact the [facility] Attending or Consulting provider 7A - 7P or covering provider during after hours 7P -7A, for this patient?   Check the care team in [facility] and look for a) attending/consulting [facility] provider listed and b) the [facility] team listed Log into [email or web address] and use [place]'s universal password to access. If you do not have the password, please contact the hospital operator. Locate the [facility] provider you are looking for under [facility] and page to a number that you can be directly reached. If you still have difficulty reaching the provider, please page the [facility] (Director on Call) for the Hospitalists listed on amion for assistance.  09/19/2023, 4:38 PM

## 2023-09-20 ENCOUNTER — Observation Stay (HOSPITAL_COMMUNITY)

## 2023-09-20 DIAGNOSIS — Z825 Family history of asthma and other chronic lower respiratory diseases: Secondary | ICD-10-CM | POA: Diagnosis not present

## 2023-09-20 DIAGNOSIS — K5900 Constipation, unspecified: Secondary | ICD-10-CM | POA: Diagnosis present

## 2023-09-20 DIAGNOSIS — M4316 Spondylolisthesis, lumbar region: Secondary | ICD-10-CM | POA: Diagnosis present

## 2023-09-20 DIAGNOSIS — N400 Enlarged prostate without lower urinary tract symptoms: Secondary | ICD-10-CM | POA: Diagnosis present

## 2023-09-20 DIAGNOSIS — Z79890 Hormone replacement therapy (postmenopausal): Secondary | ICD-10-CM | POA: Diagnosis not present

## 2023-09-20 DIAGNOSIS — M109 Gout, unspecified: Secondary | ICD-10-CM | POA: Diagnosis present

## 2023-09-20 DIAGNOSIS — N3000 Acute cystitis without hematuria: Secondary | ICD-10-CM | POA: Diagnosis present

## 2023-09-20 DIAGNOSIS — G459 Transient cerebral ischemic attack, unspecified: Secondary | ICD-10-CM

## 2023-09-20 DIAGNOSIS — N3090 Cystitis, unspecified without hematuria: Secondary | ICD-10-CM | POA: Diagnosis present

## 2023-09-20 DIAGNOSIS — R9389 Abnormal findings on diagnostic imaging of other specified body structures: Secondary | ICD-10-CM | POA: Diagnosis not present

## 2023-09-20 DIAGNOSIS — E039 Hypothyroidism, unspecified: Secondary | ICD-10-CM | POA: Diagnosis present

## 2023-09-20 DIAGNOSIS — A419 Sepsis, unspecified organism: Secondary | ICD-10-CM | POA: Diagnosis not present

## 2023-09-20 DIAGNOSIS — G894 Chronic pain syndrome: Secondary | ICD-10-CM | POA: Diagnosis not present

## 2023-09-20 DIAGNOSIS — M5030 Other cervical disc degeneration, unspecified cervical region: Secondary | ICD-10-CM | POA: Diagnosis present

## 2023-09-20 DIAGNOSIS — G450 Vertebro-basilar artery syndrome: Secondary | ICD-10-CM | POA: Diagnosis not present

## 2023-09-20 DIAGNOSIS — D696 Thrombocytopenia, unspecified: Secondary | ICD-10-CM | POA: Diagnosis present

## 2023-09-20 DIAGNOSIS — R0902 Hypoxemia: Secondary | ICD-10-CM | POA: Diagnosis present

## 2023-09-20 DIAGNOSIS — N179 Acute kidney failure, unspecified: Secondary | ICD-10-CM | POA: Diagnosis present

## 2023-09-20 DIAGNOSIS — G40409 Other generalized epilepsy and epileptic syndromes, not intractable, without status epilepticus: Secondary | ICD-10-CM | POA: Diagnosis present

## 2023-09-20 DIAGNOSIS — R4781 Slurred speech: Secondary | ICD-10-CM | POA: Diagnosis present

## 2023-09-20 DIAGNOSIS — Z8546 Personal history of malignant neoplasm of prostate: Secondary | ICD-10-CM | POA: Diagnosis not present

## 2023-09-20 DIAGNOSIS — K219 Gastro-esophageal reflux disease without esophagitis: Secondary | ICD-10-CM | POA: Diagnosis present

## 2023-09-20 DIAGNOSIS — I6622 Occlusion and stenosis of left posterior cerebral artery: Secondary | ICD-10-CM | POA: Diagnosis present

## 2023-09-20 DIAGNOSIS — J9811 Atelectasis: Secondary | ICD-10-CM | POA: Diagnosis not present

## 2023-09-20 DIAGNOSIS — G40909 Epilepsy, unspecified, not intractable, without status epilepticus: Secondary | ICD-10-CM | POA: Diagnosis not present

## 2023-09-20 DIAGNOSIS — A4151 Sepsis due to Escherichia coli [E. coli]: Secondary | ICD-10-CM | POA: Diagnosis not present

## 2023-09-20 DIAGNOSIS — M5412 Radiculopathy, cervical region: Secondary | ICD-10-CM | POA: Diagnosis present

## 2023-09-20 DIAGNOSIS — I10 Essential (primary) hypertension: Secondary | ICD-10-CM | POA: Diagnosis present

## 2023-09-20 DIAGNOSIS — I6523 Occlusion and stenosis of bilateral carotid arteries: Secondary | ICD-10-CM | POA: Diagnosis present

## 2023-09-20 DIAGNOSIS — R062 Wheezing: Secondary | ICD-10-CM | POA: Diagnosis not present

## 2023-09-20 DIAGNOSIS — Z801 Family history of malignant neoplasm of trachea, bronchus and lung: Secondary | ICD-10-CM | POA: Diagnosis not present

## 2023-09-20 DIAGNOSIS — E7800 Pure hypercholesterolemia, unspecified: Secondary | ICD-10-CM | POA: Diagnosis present

## 2023-09-20 DIAGNOSIS — N3941 Urge incontinence: Secondary | ICD-10-CM | POA: Diagnosis present

## 2023-09-20 DIAGNOSIS — Z7982 Long term (current) use of aspirin: Secondary | ICD-10-CM | POA: Diagnosis not present

## 2023-09-20 DIAGNOSIS — R5381 Other malaise: Secondary | ICD-10-CM | POA: Diagnosis not present

## 2023-09-20 DIAGNOSIS — G9389 Other specified disorders of brain: Secondary | ICD-10-CM | POA: Diagnosis present

## 2023-09-20 DIAGNOSIS — E669 Obesity, unspecified: Secondary | ICD-10-CM | POA: Diagnosis present

## 2023-09-20 DIAGNOSIS — Z791 Long term (current) use of non-steroidal anti-inflammatories (NSAID): Secondary | ICD-10-CM | POA: Diagnosis not present

## 2023-09-20 DIAGNOSIS — Z8673 Personal history of transient ischemic attack (TIA), and cerebral infarction without residual deficits: Secondary | ICD-10-CM | POA: Diagnosis not present

## 2023-09-20 DIAGNOSIS — N3001 Acute cystitis with hematuria: Secondary | ICD-10-CM | POA: Diagnosis present

## 2023-09-20 DIAGNOSIS — G40209 Localization-related (focal) (partial) symptomatic epilepsy and epileptic syndromes with complex partial seizures, not intractable, without status epilepticus: Secondary | ICD-10-CM | POA: Diagnosis present

## 2023-09-20 DIAGNOSIS — N390 Urinary tract infection, site not specified: Secondary | ICD-10-CM | POA: Diagnosis not present

## 2023-09-20 DIAGNOSIS — R0989 Other specified symptoms and signs involving the circulatory and respiratory systems: Secondary | ICD-10-CM | POA: Diagnosis not present

## 2023-09-20 DIAGNOSIS — I6612 Occlusion and stenosis of left anterior cerebral artery: Secondary | ICD-10-CM | POA: Diagnosis present

## 2023-09-20 DIAGNOSIS — Z87891 Personal history of nicotine dependence: Secondary | ICD-10-CM | POA: Diagnosis not present

## 2023-09-20 LAB — BLOOD CULTURE ID PANEL (REFLEXED) - BCID2

## 2023-09-20 LAB — CBC
HCT: 36 % — ABNORMAL LOW (ref 39.0–52.0)
Hemoglobin: 12.3 g/dL — ABNORMAL LOW (ref 13.0–17.0)
MCH: 31.5 pg (ref 26.0–34.0)
MCHC: 34.2 g/dL (ref 30.0–36.0)
MCV: 92.1 fL (ref 80.0–100.0)
Platelets: 156 10*3/uL (ref 150–400)
RBC: 3.91 MIL/uL — ABNORMAL LOW (ref 4.22–5.81)
RDW: 13.3 % (ref 11.5–15.5)
WBC: 8.5 10*3/uL (ref 4.0–10.5)
nRBC: 0 % (ref 0.0–0.2)

## 2023-09-20 LAB — LIPID PANEL
Cholesterol: 186 mg/dL (ref 0–200)
HDL: 27 mg/dL — ABNORMAL LOW (ref >40)
LDL Cholesterol: 132 mg/dL — ABNORMAL HIGH (ref 0–99)
Total CHOL/HDL Ratio: 6.9 ratio
Triglycerides: 136 mg/dL (ref <150)
VLDL: 27 mg/dL (ref 0–40)

## 2023-09-20 LAB — HEMOGLOBIN A1C
Hgb A1c MFr Bld: 5 % (ref 4.8–5.6)
Mean Plasma Glucose: 96.8 mg/dL

## 2023-09-20 LAB — COMPREHENSIVE METABOLIC PANEL WITH GFR
ALT: 16 U/L (ref 0–44)
AST: 26 U/L (ref 15–41)
Albumin: 2.5 g/dL — ABNORMAL LOW (ref 3.5–5.0)
Alkaline Phosphatase: 48 U/L (ref 38–126)
Anion gap: 10 (ref 5–15)
BUN: 20 mg/dL (ref 8–23)
CO2: 22 mmol/L (ref 22–32)
Calcium: 7.7 mg/dL — ABNORMAL LOW (ref 8.9–10.3)
Chloride: 105 mmol/L (ref 98–111)
Creatinine, Ser: 1.4 mg/dL — ABNORMAL HIGH (ref 0.61–1.24)
GFR, Estimated: 52 mL/min — ABNORMAL LOW (ref >60)
Glucose, Bld: 148 mg/dL — ABNORMAL HIGH (ref 70–99)
Potassium: 3.4 mmol/L — ABNORMAL LOW (ref 3.5–5.1)
Sodium: 137 mmol/L (ref 135–145)
Total Bilirubin: 0.9 mg/dL (ref 0.0–1.2)
Total Protein: 5.9 g/dL — ABNORMAL LOW (ref 6.5–8.1)

## 2023-09-20 LAB — ECHOCARDIOGRAM COMPLETE
Area-P 1/2: 3.03 cm2
Calc EF: 68.5 %
S' Lateral: 2.5 cm
Single Plane A2C EF: 72.3 %
Single Plane A4C EF: 66.6 %

## 2023-09-20 MED ORDER — POTASSIUM CHLORIDE CRYS ER 20 MEQ PO TBCR
40.0000 meq | EXTENDED_RELEASE_TABLET | Freq: Once | ORAL | Status: AC
  Administered 2023-09-20: 40 meq via ORAL
  Filled 2023-09-20: qty 2

## 2023-09-20 MED ORDER — PERFLUTREN LIPID MICROSPHERE
1.0000 mL | INTRAVENOUS | Status: AC | PRN
  Administered 2023-09-20: 1 mL via INTRAVENOUS

## 2023-09-20 MED ORDER — IPRATROPIUM-ALBUTEROL 0.5-2.5 (3) MG/3ML IN SOLN
3.0000 mL | Freq: Four times a day (QID) | RESPIRATORY_TRACT | Status: DC | PRN
  Administered 2023-09-20: 3 mL via RESPIRATORY_TRACT
  Filled 2023-09-20: qty 3

## 2023-09-20 NOTE — TOC Initial Note (Signed)
 Transition of Care [facility]) - Initial/Assessment Note    Patient Details  Name: Brandon Flores MRN: [id number] Date of Birth: 12-Feb-1946  Transition of Care [facility]) CM/SW Contact:    Jonathan Neighbor, RN Phone Number: 09/20/2023, 4:21 PM  Clinical Narrative:                  Pt is from home with his spouse. They are together all the time.  Pt manages his own medications and denies any issues.  Pt drives self but spouse can also provide transportation. Outpatient vestibular therapy set up with [facility] per pt preference. Information on the AVS. Pt will call to schedule the first appointment. Family will transport home when medically ready.  TOC following.  Expected Discharge Plan: OP Rehab Barriers to Discharge: Continued Medical Work up   Patient Goals and CMS Choice     Choice offered to / list presented to : Patient      Expected Discharge Plan and Services   Discharge Planning Services: CM Consult   Living arrangements for the past 2 months: Single Family Home                                      Prior Living Arrangements/Services Living arrangements for the past 2 months: Single Family Home Lives with:: Spouse Patient language and need for interpreter reviewed:: Yes Do you feel safe going back to the place where you live?: Yes        Care giver support system in place?: Yes (comment) Current home services: DME (cane/ walker / shower seat) Criminal Activity/Legal Involvement Pertinent to Current Situation/Hospitalization: No - Comment as needed  Activities of Daily Living      Permission Sought/Granted                  Emotional Assessment Appearance:: Appears stated age Attitude/Demeanor/Rapport: Engaged Affect (typically observed): Accepting Orientation: : Oriented to Self, Oriented to Place, Oriented to  Time, Oriented to Situation   Psych Involvement: No (comment)  Admission diagnosis:  Acute cystitis with hematuria [N30.01] Sepsis (HCC)  [A41.9] Altered mental status, unspecified altered mental status type [R41.82] Sepsis, due to unspecified organism, unspecified whether acute organ dysfunction present [facility]) [A41.9] Patient Active Problem List   Diagnosis Date Noted   Sepsis (HCC) 09/19/2023   Acute cystitis with hematuria 09/19/2023   TIA (transient ischemic attack) 09/19/2023   Encephalomalacia 12/06/2021   Malignant neoplasm of prostate (HCC) 02/03/2020   Obesity (BMI 30-39.9) 08/31/2016   Chronic neck pain 08/31/2016   Cervical radiculopathy 05/03/2016   HLD (hyperlipidemia) 05/03/2016   GERD (gastroesophageal reflux disease) 04/25/2016   Hyperuricemia 04/25/2016   Low back pain without sciatica 04/25/2016   Degenerative arthritis of left knee 04/25/2016   Metatarsalgia of both feet 04/25/2016   Gout 04/25/2016   Migraine headache 04/25/2016   Nuclear sclerosis of both eyes 04/25/2016   History of colonic polyps 04/25/2016   VBI (vertebrobasilar insufficiency) 04/25/2016   Vertigo 03/10/2014   Generalized convulsive epilepsy (HCC) 11/12/2013   Choroidal nevus, right 10/12/2013   Vitreous floaters 10/12/2013   PCP:  Rey Catholic, MD Pharmacy:   [facility] [address], [place] - [address] [place] [address] Phone: [phone] Fax: [phone]  [facility] [address] [place] [address] Phone: [phone] Fax: [phone]     Social Drivers  of Health (SDOH) Social History: SDOH Screenings   Food Insecurity: No Food Insecurity (09/19/2023)  Housing: Low Risk  (09/19/2023)  Transportation Needs: No Transportation Needs (09/19/2023)  Utilities: Not At Risk (09/19/2023)  Social Connections: Socially Integrated (09/19/2023)  Tobacco Use: Medium Risk (09/19/2023)   SDOH Interventions:     Readmission Risk Interventions     No data to display

## 2023-09-20 NOTE — Evaluation (Signed)
 Physical Therapy Evaluation Patient Details Name: Brandon Flores MRN: [id number] DOB: 05-16-1945 Today's Date: 09/20/2023  History of Present Illness  78 year old male comes into the hospital with dizziness and slurred speech. PMH HLD, hypothyroidism, prostate cancer, BPH, vertebrobasilar insufficiency syndrome, encephalomalacia.  Clinical Impression  Pt presents with admitting diagnosis above. Co-treat with OT. Pt today was able to ambulate in hallway and perform DGI. Pt scored 11/24 on DGI indicating that pt is a very high falls risk. PTA pt was independent mostly furniture walking around the house. Recommend OP neuro PT upon DC. PT will continue to follow.         If plan is discharge home, recommend the following: A little help with walking and/or transfers;A little help with bathing/dressing/bathroom;Assistance with cooking/housework;Assist for transportation;Help with stairs or ramp for entrance;Supervision due to cognitive status   Can travel by private vehicle        Equipment Recommendations None recommended by PT  Recommendations for Other Services       Functional Status Assessment Patient has had a recent decline in their functional status and demonstrates the ability to make significant improvements in function in a reasonable and predictable amount of time.     Precautions / Restrictions Precautions Precautions: Fall Recall of Precautions/Restrictions: Intact Restrictions Weight Bearing Restrictions Per Provider Order: No      Mobility  Bed Mobility Overal bed mobility: Needs Assistance Bed Mobility: Supine to Sit, Sit to Supine     Supine to sit: Supervision Sit to supine: Supervision        Transfers Overall transfer level: Needs assistance Equipment used: None Transfers: Sit to/from Stand Sit to Stand: Contact guard assist           General transfer comment: CGA for safety    Ambulation/Gait Ambulation/Gait assistance: Contact guard  assist Gait Distance (Feet): 250 Feet Assistive device: None Gait Pattern/deviations: Drifts right/left, Trunk flexed, Decreased stride length, Step-through pattern Gait velocity: decreased     General Gait Details: Pt noted with 2 minor LOB requiring CGA to correct. Able to perform DGI.  Stairs Stairs: Yes Stairs assistance: Contact guard assist Stair Management: Two rails, Alternating pattern, Forwards Number of Stairs: 2 General stair comments: Part of DGI.  Wheelchair Mobility     Tilt Bed    Modified Rankin (Stroke Patients Only)       Balance Overall balance assessment: Needs assistance Sitting-balance support: Bilateral upper extremity supported, Feet supported Sitting balance-Leahy Scale: Good     Standing balance support: No upper extremity supported, During functional activity Standing balance-Leahy Scale: Fair                   Standardized Balance Assessment Standardized Balance Assessment : Dynamic Gait Index   Dynamic Gait Index Level Surface: Mild Impairment Change in Gait Speed: Moderate Impairment Gait with Horizontal Head Turns: Mild Impairment Gait with Vertical Head Turns: Mild Impairment Gait and Pivot Turn: Moderate Impairment Step Over Obstacle: Moderate Impairment Step Around Obstacles: Severe Impairment Steps: Mild Impairment Total Score: 11       Pertinent Vitals/Pain Pain Assessment Pain Assessment: Faces Faces Pain Scale: No hurt    Home Living Family/patient expects to be discharged to:: Private residence Living Arrangements: Spouse/significant other Available Help at Discharge: Family;Available 24 hours/day Type of Home: House Home Access: Stairs to enter Entrance Stairs-Rails: Can reach both Entrance Stairs-Number of Steps: 3+1 from outside to inside, 3 to get to den area Alternate [employer] of Steps: 14 Home Layout:  Two level Home Equipment: Shower seat;Cane - single point;Rollator (4 wheels);Other  (comment);Cane - quad (3 wheel walker.)      Prior Function Prior Level of Function : Independent/Modified Independent;Driving             Mobility Comments: furniture walked in home, [facility] in community. ADLs Comments: ind     Extremity/Trunk Assessment   Upper Extremity Assessment Upper Extremity Assessment: Overall WFL for tasks assessed    Lower Extremity Assessment Lower Extremity Assessment: Overall WFL for tasks assessed    Cervical / Trunk Assessment Cervical / Trunk Assessment: Normal  Communication   Communication Communication: No apparent difficulties    Cognition Arousal: Alert Behavior During Therapy: WFL for tasks assessed/performed                           PT - Cognition Comments: Pt likes to joke around Following commands: Intact       Cueing Cueing Techniques: Verbal cues, Tactile cues     General Comments General comments (skin integrity, edema, etc.): VSS on RA    Exercises     Assessment/Plan    PT Assessment Patient needs continued PT services  PT Problem List Decreased strength;Decreased range of motion;Decreased activity tolerance;Decreased balance;Decreased mobility;Decreased coordination;Decreased cognition;Decreased knowledge of use of DME;Decreased safety awareness;Decreased knowledge of precautions;Cardiopulmonary status limiting activity       PT Treatment Interventions DME instruction;Gait training;Stair training;Therapeutic activities;Functional mobility training;Therapeutic exercise;Balance training;Neuromuscular re-education;Cognitive remediation;Patient/family education    PT Goals (Current goals can be found in the Care Plan section)  Acute Rehab PT Goals Patient Stated Goal: to go home PT Goal Formulation: With patient Time For Goal Achievement: 10/04/23 Potential to Achieve Goals: Good    Frequency Min 3X/week     Co-evaluation               AM-PAC PT 6 Clicks Mobility  Outcome Measure Help  needed turning from your back to your side while in a flat bed without using bedrails?: A Little Help needed moving from lying on your back to sitting on the side of a flat bed without using bedrails?: A Little Help needed moving to and from a bed to a chair (including a wheelchair)?: A Little Help needed standing up from a chair using your arms (e.g., wheelchair or bedside chair)?: A Little Help needed to walk in hospital room?: A Little Help needed climbing 3-5 steps with a railing? : A Little 6 Click Score: 18    End of Session Equipment Utilized During Treatment: Gait belt Activity Tolerance: Patient tolerated treatment well Patient left: in bed;with call bell/phone within reach;with family/visitor present Nurse Communication: Mobility status PT Visit Diagnosis: Other abnormalities of gait and mobility (R26.89)    Time: 0981-1914 PT Time Calculation (min) (ACUTE ONLY): 44 min   Charges:   PT Evaluation $PT Eval Moderate Complexity: 1 Mod PT Treatments $Gait Training: 8-22 mins PT General Charges $$ ACUTE PT VISIT: 1 Visit         Rodgers Clack, PT, DPT Acute Rehab Services [phone]   Nadiya Pieratt 09/20/2023, 4:28 PM

## 2023-09-20 NOTE — Progress Notes (Signed)
 Pt temp 102.9 axillary.  Verbalizes no complaints. Dr. Aldona Amel notified via secure chat. No new orders.  Pt medicated with tylenol .

## 2023-09-20 NOTE — Evaluation (Signed)
 Occupational Therapy Evaluation Patient Details Name: Brandon Flores MRN: [id number] DOB: [birth year] Today's Date: 09/20/2023   History of Present Illness   78 year old male comes into the hospital with dizziness and slurred speech. PMH HLD, hypothyroidism, prostate cancer, BPH, vertebrobasilar insufficiency syndrome, encephalomalacia.     Clinical Impressions Pt admitted for above, PTA pt reports driving and being ind with ADLs/iADLs. Pt currently presenting with impaired balance, he was able to ambulate hallway with CGA no AD but c/o increasing dizziness with prolonged OOB mobility, BP was stable 119/55. Pt does have hx of vertigo and dizziness did do better with gaze stabilization, no nystagmus noted during eval. Pt overall completing Adls with CGA to setup A, he does display some cognitive deficits but seems to be masking them quite well. He would benefit from further cognitive testing if receptive. OT to continue to progress pt as able. Recommend Outpatient Neuro OT services.      If plan is discharge home, recommend the following:   Assistance with cooking/housework;Assist for transportation;Supervision due to cognitive status     Functional Status Assessment   Patient has had a recent decline in their functional status and/or demonstrates limited ability to make significant improvements in function in a reasonable and predictable amount of time     Equipment Recommendations   None recommended by OT     Recommendations for Other Services         Precautions/Restrictions   Precautions Precautions: Fall Recall of Precautions/Restrictions: Intact Restrictions Weight Bearing Restrictions Per Provider Order: No     Mobility Bed Mobility Overal bed mobility: Needs Assistance Bed Mobility: Supine to Sit, Sit to Supine     Supine to sit: Supervision Sit to supine: Supervision        Transfers Overall transfer level: Needs assistance Equipment used:  None Transfers: Sit to/from Stand Sit to Stand: Contact guard assist           General transfer comment: CGA for safety, when returning to bed pt noted to begin losing balance and plopped bottom onto bed with OT guiding/guarding pt. During transition pt stated I'm going to sit down but retored to him that he had clearly lost his balance. pt joking and in a bit of denial      Balance Overall balance assessment: Needs assistance Sitting-balance support: Bilateral upper extremity supported, Feet supported Sitting balance-Leahy Scale: Good     Standing balance support: No upper extremity supported, During functional activity Standing balance-Leahy Scale: Fair                             ADL either performed or assessed with clinical judgement   ADL Overall ADL's : Needs assistance/impaired Eating/Feeding: Independent;Sitting   Grooming: Standing;Contact guard assist   Upper Body Bathing: Standing;Contact guard assist   Lower Body Bathing: Sitting/lateral leans;Contact guard assist   Upper Body Dressing : Sitting;Set up   Lower Body Dressing: Sitting/lateral leans;Contact guard assist Lower Body Dressing Details (indicate cue type and reason): doff/donned socks. Spouse reports he sometimes gets dizzy with that but no complaints with task today. Toilet Transfer: Electronics engineer Details (indicate cue type and reason): CGA pt with mild sway of balance when going to sit but caught  himself Toileting- Architect and Hygiene: Contact guard assist;Sit to/from stand       Functional mobility during ADLs: Contact guard assist       Vision Patient Visual Report: No  change from baseline Vision Assessment?: No apparent visual deficits Eye Alignment: Within Functional Limits Ocular Range of Motion: Other (comment) (L eye seemed to not track to full lateral range) Alignment/Gaze Preference: Within Defined Limits Tracking/Visual  Pursuits: Able to track stimulus in all quads without difficulty Convergence: Within functional limits Additional Comments: no nystagmus noted     Perception Perception: Within Functional Limits       Praxis Praxis: WFL       Pertinent Vitals/Pain Pain Assessment Pain Assessment: Faces Faces Pain Scale: No hurt     Extremity/Trunk Assessment Upper Extremity Assessment Upper Extremity Assessment: Overall WFL for tasks assessed   Lower Extremity Assessment Lower Extremity Assessment: Overall WFL for tasks assessed   Cervical / Trunk Assessment Cervical / Trunk Assessment: Normal   Communication Communication Communication: No apparent difficulties   Cognition Arousal: Alert Behavior During Therapy: WFL for tasks assessed/performed         Memory impairment (select all impairments): Working [employer] fast streamer, Short-term memory     OT - Cognition Comments: Pt deferred further cognitive screening with SLP. He did not score well on their SLUMS assessment, SLP discussed possible assist with med management from spouse. During OT session pt score 0/3 on word recall, he is very jocile and may be masking and cognitive deficits. He is able to process simple/routine tasks without challange but may have deficits in higher level executive functioning tasks. Spouse reports pt to be near baseline.                 Following commands: Intact       Cueing  General Comments   Cueing Techniques: Verbal cues;Tactile cues  VSS on RA   Exercises     Shoulder Instructions      Home Living Family/patient expects to be discharged to:: Private residence Living Arrangements: Spouse/significant other Available Help at Discharge: Family;Available 24 hours/day Type of Home: House Home Access: Stairs to enter [employer] of Steps: 3+1 from outside to inside, 3 to get to den area [employer]: Can reach both Home Layout: Two level;Bed/bath upstairs Alternate Level  Stairs-Number of Steps: 14 Alternate Level Stairs-Rails: Right (chair rail on L) Bathroom Shower/Tub: Chief Strategy Officer: Standard     Home Equipment: Shower seat;Cane - single point;Rollator (4 wheels);Other (comment);Cane - quad      Lives With: Spouse    Prior Functioning/Environment Prior Level of Function : Independent/Modified Independent;Driving             Mobility Comments: furniture walked in home, [facility] in community. ADLs Comments: ind    OT Problem List: Impaired balance (sitting and/or standing);Decreased cognition   OT Treatment/Interventions: Balance training;Therapeutic activities;DME and/or AE instruction;Self-care/ADL training      OT Goals(Current goals can be found in the care plan section)   Acute Rehab OT Goals Patient Stated Goal: To return home OT Goal Formulation: With patient Time For Goal Achievement: 10/04/23 Potential to Achieve Goals: Good ADL Goals Pt Will Perform Grooming: with modified independence;standing Pt Will Perform Lower Body Dressing: with modified independence;sit to/from stand Pt Will Transfer to Toilet: with modified independence;ambulating Pt Will Perform Tub/Shower Transfer: with modified independence;ambulating Additional ADL Goal #1: Pt will score WFL on a cognitive assessment to determine safe indpendent management of medications.   OT Frequency:  Min 2X/week    Co-evaluation PT/OT/SLP Co-Evaluation/Treatment: Yes   PT goals addressed during session: Mobility/safety with mobility;Balance OT goals addressed during session: ADL's and self-care  AM-PAC OT 6 Clicks Daily Activity     Outcome Measure Help from another person eating meals?: None Help from another person taking care of personal grooming?: A Little Help from another person toileting, which includes using toliet, bedpan, or urinal?: A Little Help from another person bathing (including washing, rinsing, drying)?: A Little Help from  another person to put on and taking off regular upper body clothing?: A Little Help from another person to put on and taking off regular lower body clothing?: A Little 6 Click Score: 19   End of Session Equipment Utilized During Treatment: Gait belt Nurse Communication: Other (comment) (notified NT that pt linens were soaked in sweat)  Activity Tolerance: Patient tolerated treatment well Patient left: in bed;with call bell/phone within reach;with bed alarm set  OT Visit Diagnosis: Unsteadiness on feet (R26.81);Other abnormalities of gait and mobility (R26.89);Other symptoms and signs involving cognitive function                Time: 1400-1440 OT Time Calculation (min): 40 min Charges:  OT General Charges $OT Visit: 1 Visit OT Evaluation $OT Eval Low Complexity: 1 Low  09/20/2023  AB, OTR/L  Acute Rehabilitation Services  Office: [phone]   Jorene New 09/20/2023, 6:08 PM

## 2023-09-20 NOTE — Progress Notes (Signed)
 PHARMACY - PHYSICIAN COMMUNICATION CRITICAL VALUE ALERT - BLOOD CULTURE IDENTIFICATION (BCID)  Brandon Flores is an 78 y.o. male who presented to [facility] on 09/19/2023 with a chief complaint of dizziness and concerns for sepsis with urinary source  Assessment:  2/2 blood cultures with E. Coli (no resistance)  Name of physician (or Provider) Contacted: Dr. Ulice Gamer  Current antibiotics: Ceftriaxone  2g IV every 24 hours  Changes to prescribed antibiotics recommended:   -Continue current course  Results for orders placed or performed during the hospital encounter of 09/19/23  Blood Culture ID Panel (Reflexed) (Collected: 09/19/2023  8:41 AM)  Result Value Ref Range   Enterococcus faecalis NOT DETECTED NOT DETECTED   Enterococcus Faecium NOT DETECTED NOT DETECTED   Listeria monocytogenes NOT DETECTED NOT DETECTED   Staphylococcus species NOT DETECTED NOT DETECTED   Staphylococcus aureus (BCID) NOT DETECTED NOT DETECTED   Staphylococcus epidermidis NOT DETECTED NOT DETECTED   Staphylococcus lugdunensis NOT DETECTED NOT DETECTED   Streptococcus species NOT DETECTED NOT DETECTED   Streptococcus agalactiae NOT DETECTED NOT DETECTED   Streptococcus pneumoniae NOT DETECTED NOT DETECTED   Streptococcus pyogenes NOT DETECTED NOT DETECTED   A.calcoaceticus-baumannii NOT DETECTED NOT DETECTED   Bacteroides fragilis NOT DETECTED NOT DETECTED   Enterobacterales DETECTED (A) NOT DETECTED   Enterobacter cloacae complex NOT DETECTED NOT DETECTED   Escherichia coli DETECTED (A) NOT DETECTED   Klebsiella aerogenes NOT DETECTED NOT DETECTED   Klebsiella oxytoca NOT DETECTED NOT DETECTED   Klebsiella pneumoniae NOT DETECTED NOT DETECTED   Proteus species NOT DETECTED NOT DETECTED   Salmonella species NOT DETECTED NOT DETECTED   Serratia marcescens NOT DETECTED NOT DETECTED   Haemophilus influenzae NOT DETECTED NOT DETECTED   Neisseria meningitidis NOT DETECTED NOT DETECTED   Pseudomonas  aeruginosa NOT DETECTED NOT DETECTED   Stenotrophomonas maltophilia NOT DETECTED NOT DETECTED   Candida albicans NOT DETECTED NOT DETECTED   Candida auris NOT DETECTED NOT DETECTED   Candida glabrata NOT DETECTED NOT DETECTED   Candida krusei NOT DETECTED NOT DETECTED   Candida parapsilosis NOT DETECTED NOT DETECTED   Candida tropicalis NOT DETECTED NOT DETECTED   Cryptococcus neoformans/gattii NOT DETECTED NOT DETECTED   CTX-M ESBL NOT DETECTED NOT DETECTED   Carbapenem resistance IMP NOT DETECTED NOT DETECTED   Carbapenem resistance KPC NOT DETECTED NOT DETECTED   Carbapenem resistance NDM NOT DETECTED NOT DETECTED   Carbapenem resist OXA 48 LIKE NOT DETECTED NOT DETECTED   Carbapenem resistance VIM NOT DETECTED NOT DETECTED    Young Hensen, PharmD, BCPS Clinical Pharmacist 09/20/2023 1:55 AM

## 2023-09-20 NOTE — Progress Notes (Signed)
 PROGRESS NOTE  Brandon TUMMINELLO Flores OAC:[id number] DOB: 23-Aug-1945 DOA: 09/19/2023 PCP: Rey Catholic, MD   LOS: 0 days   Brief Narrative / Interim history: 78 year old male with HLD, hypothyroidism, prostate cancer, BPH, vertebrobasilar insufficiency syndrome, encephalomalacia comes into the hospital with dizziness and slurred speech.  He reports feeling well when he went to bed tonight prior to admission, but woke up around 3 AM and went to the bathroom, felt very dizzy similar to his prior vertigo episodes.  He was so weak with this dizziness that was unable to get back into his bed and ended up slipping on the floor.  He was brought to the hospital by family.  When he presented to the ED he was found to be febrile to 102, tachycardic, tachypneic.  He was satting well on room air.  Urinalysis had some evidence of infection.  He was admitted for presumed UTI versus TIA rule out  Subjective / 24h Interval events: He is feeling very weak this morning, he is trying to reposition himself in bed but got out of breath  Assesement and Plan: Principal Problem:   Sepsis (HCC) Active Problems:   GERD (gastroesophageal reflux disease)   Gout   VBI (vertebrobasilar insufficiency)   Generalized convulsive epilepsy (HCC)   HLD (hyperlipidemia)   Obesity (BMI 30-39.9)   Malignant neoplasm of prostate (HCC)   Acute cystitis with hematuria   TIA (transient ischemic attack)  Principal problem Sepsis due to E. coli bacteremia, suspect underlying UTI -patient admitted to the hospital with fever of 102, tachycardia, tachypnea.  Blood cultures overnight speciated in 2 out of 2 bottles E. coli which appears to not have any significant resistance.  He is on ceftriaxone , continue for now while awaiting sensitivities - Still with temperature of 100.3 today, still tachycardic, sepsis physiology not resolved.  In addition, remains weak  Active problems UTI-on ceftriaxone  as above  Hypoxemia, wheezing -patient  with some upper airway wheezing this morning, tachypneic.  He tells me he intermittently has wheezing at home with activities.  He was placed on 3 L as he was satting 85% on room air.  Will stop fluids and repeat a chest x-ray.  2D echo pending  Vertigo, slurred speech, concern for TIA -workup underway, MRI on admission was negative for stroke, did show sequela of remote hemorrhage in the anterior left temporal lobe  History of seizures-continue Tegretol   Gout-continue home allopurinol   Hypothyroidism-continue Synthroid   History of prostate cancer-wearing depends at home, continue dutasteride , has a degree of incontinence  Scheduled Meds:   stroke: early stages of recovery book   Does not apply Once   allopurinol   100 mg Oral Daily   aspirin  300 mg Rectal Daily   Or   aspirin  325 mg Oral Daily   carbamazepine   400 mg Oral TID   dutasteride   0.5 mg Oral QPM   levothyroxine   112 mcg Oral Q0600   potassium chloride  40 mEq Oral Once   sodium chloride  flush  3 mL Intravenous Q12H   tamsulosin   0.8 mg Oral QPC supper   Continuous Infusions:  cefTRIAXone  (ROCEPHIN )  IV     PRN Meds:.acetaminophen  **OR** acetaminophen , ipratropium-albuterol , meclizine , mouth rinse, polyethylene glycol  Current Outpatient Medications  Medication Instructions   allopurinol  (ZYLOPRIM ) 100 mg, Oral, Daily   carbamazepine  (TEGRETOL ) 400 mg, Oral, 3 times daily, WILL TRY SWITCH TO GENERIC   dutasteride  (AVODART ) 0.5 mg, Oral, Every evening   levothyroxine  (SYNTHROID ) 112 mcg, Daily  meclizine  (ANTIVERT ) 25 mg, Oral, 3 times daily PRN   meloxicam (MOBIC) 15 mg, Daily   OVER THE COUNTER MEDICATION Stool softener with probiotic twice a week.   oxyCODONE -acetaminophen  (PERCOCET/ROXICET) 5-325 MG tablet 1 tablet, Oral, Every 8 hours PRN   tamsulosin  (FLOMAX ) 0.8 mg, Oral, Daily after supper    Diet Orders (From admission, onward)     Start     Ordered   09/19/23 1730  Diet regular Room service  appropriate? Yes; Fluid consistency: Thin  Diet effective now       Comments: After swallow screen  Question Answer Comment  Room service appropriate? Yes   Fluid consistency: Thin      09/19/23 1643            DVT prophylaxis: SCDs Start: 09/19/23 1641   Lab Results  Component Value Date   PLT 156 09/20/2023      Code Status: Full Code  Family Communication: Wife at bedside  Status is: Observation The patient will require care spanning > 2 midnights and should be moved to inpatient because: Septic physiology is persistent, hypoxemia   Level of care: Telemetry Medical  Consultants:  None  Objective: Vitals:   09/20/23 0434 09/20/23 0750 09/20/23 0757 09/20/23 0807  BP: (!) 142/73 124/62    Pulse: (!) 107 (!) 115    Resp: 19 (!) 30    Temp: 100.3 F (37.9 C) 99 F (37.2 C)    TempSrc: Oral Oral    SpO2: 92% 95% (!) 85% 96%    Intake/Output Summary (Last 24 hours) at 09/20/2023 0941 Last data filed at 09/19/2023 1800 Gross per 24 hour  Intake 829.5 ml  Output --  Net 829.5 ml   Wt Readings from Last 3 Encounters:  06/13/23 107.5 kg  09/08/22 106.6 kg  06/13/22 108.9 kg    Examination:  Constitutional: NAD Eyes: no scleral icterus ENMT: Mucous membranes are moist.  Neck: normal, supple Respiratory: Diffuse end expiratory upper airway breathing, no apparent wheezing within the lung fields Cardiovascular: Regular rate and rhythm, no murmurs / rubs / gallops. No LE edema.  Abdomen: non distended, no tenderness. Bowel sounds positive.  Musculoskeletal: no clubbing / cyanosis.    Data Reviewed: I have independently reviewed following labs and imaging studies   CBC Recent Labs  Lab 09/19/23 0900 09/20/23 0540  WBC 11.9* 8.5  HGB 14.4 12.3*  HCT 41.6 36.0*  PLT 199 156  MCV 91.0 92.1  MCH 31.5 31.5  MCHC 34.6 34.2  RDW 13.2 13.3  LYMPHSABS 1.0  --   MONOABS 1.7*  --   EOSABS 0.0  --   BASOSABS 0.0  --     Recent Labs  Lab  09/19/23 0900 09/20/23 0540  NA 140 137  K 3.6 3.4*  CL 101 105  CO2 23 22  GLUCOSE 135* 148*  BUN 21 20  CREATININE 1.30* 1.40*  CALCIUM 9.3 7.7*  AST 18 26  ALT 15 16  ALKPHOS 87 48  BILITOT 1.1 0.9  ALBUMIN 3.9 2.5*  LATICACIDVEN 1.4  --   INR 1.1  --   HGBA1C  --  5.0    ------------------------------------------------------------------------------------------------------------------ Recent Labs    09/20/23 0540  CHOL 186  HDL 27*  LDLCALC 132*  TRIG 136  CHOLHDL 6.9    Lab Results  Component Value Date   HGBA1C 5.0 09/20/2023   ------------------------------------------------------------------------------------------------------------------ No results for input(s): TSH, T4TOTAL, T3FREE, THYROIDAB in the last 72 hours.  Invalid input(s): FREET3  Cardiac Enzymes No results for input(s): CKMB, TROPONINI, MYOGLOBIN in the last 168 hours.  Invalid input(s): CK ------------------------------------------------------------------------------------------------------------------ No results found for: BNP  CBG: No results for input(s): GLUCAP in the last 168 hours.  Recent Results (from the past 240 hours)  Blood Culture (routine x 2)     Status: None (Preliminary result)   Collection Time: 09/19/23  8:41 AM   Specimen: BLOOD  Result Value Ref Range Status   Specimen Description   Final    BLOOD RIGHT ANTECUBITAL Performed at [facility], [address] [place], [place] [address]    Special Requests   Final    BOTTLES DRAWN AEROBIC AND ANAEROBIC Blood Culture adequate volume Performed at [facility], [address] [place], [place] [address]    Culture  Setup Time   Final    GRAM NEGATIVE RODS IN BOTH AEROBIC AND ANAEROBIC BOTTLES CRITICAL RESULT CALLED TO, READ BACK BY AND VERIFIED WITH: Jess Morita [id number] FCP Performed at [facility], [address] [place], [place]  [address]    Culture GRAM NEGATIVE RODS  Final   Report Status PENDING  Incomplete  Blood Culture ID Panel (Reflexed)     Status: Abnormal   Collection Time: 09/19/23  8:41 AM  Result Value Ref Range Status   Enterococcus faecalis NOT DETECTED NOT DETECTED Final   Enterococcus Faecium NOT DETECTED NOT DETECTED Final   Listeria monocytogenes NOT DETECTED NOT DETECTED Final   Staphylococcus species NOT DETECTED NOT DETECTED Final   Staphylococcus aureus (BCID) NOT DETECTED NOT DETECTED Final   Staphylococcus epidermidis NOT DETECTED NOT DETECTED Final   Staphylococcus lugdunensis NOT DETECTED NOT DETECTED Final   Streptococcus species NOT DETECTED NOT DETECTED Final   Streptococcus agalactiae NOT DETECTED NOT DETECTED Final   Streptococcus pneumoniae NOT DETECTED NOT DETECTED Final   Streptococcus pyogenes NOT DETECTED NOT DETECTED Final   A.calcoaceticus-baumannii NOT DETECTED NOT DETECTED Final   Bacteroides fragilis NOT DETECTED NOT DETECTED Final   Enterobacterales DETECTED (A) NOT DETECTED Final    Comment: Enterobacterales represent a large order of gram negative bacteria, not a single organism. CRITICAL RESULT CALLED TO, READ BACK BY AND VERIFIED WITH: PHARMD Annabell Baron [phone] FCP    Enterobacter cloacae complex NOT DETECTED NOT DETECTED Final   Escherichia coli DETECTED (A) NOT DETECTED Final    Comment: CRITICAL RESULT CALLED TO, READ BACK BY AND VERIFIED WITH: PHARMD Annabell Baron [phone] FCP    Klebsiella aerogenes NOT DETECTED NOT DETECTED Final   Klebsiella oxytoca NOT DETECTED NOT DETECTED Final   Klebsiella pneumoniae NOT DETECTED NOT DETECTED Final   Proteus species NOT DETECTED NOT DETECTED Final   Salmonella species NOT DETECTED NOT DETECTED Final   Serratia marcescens NOT DETECTED NOT DETECTED Final   Haemophilus influenzae NOT DETECTED NOT DETECTED Final   Neisseria meningitidis NOT DETECTED NOT DETECTED Final   Pseudomonas aeruginosa NOT DETECTED NOT DETECTED  Final   Stenotrophomonas maltophilia NOT DETECTED NOT DETECTED Final   Candida albicans NOT DETECTED NOT DETECTED Final   Candida auris NOT DETECTED NOT DETECTED Final   Candida glabrata NOT DETECTED NOT DETECTED Final   Candida krusei NOT DETECTED NOT DETECTED Final   Candida parapsilosis NOT DETECTED NOT DETECTED Final   Candida tropicalis NOT DETECTED NOT DETECTED Final   Cryptococcus neoformans/gattii NOT DETECTED NOT DETECTED Final   CTX-M ESBL NOT DETECTED NOT DETECTED Final   Carbapenem resistance IMP NOT DETECTED NOT DETECTED Final  Carbapenem resistance KPC NOT DETECTED NOT DETECTED Final   Carbapenem resistance NDM NOT DETECTED NOT DETECTED Final   Carbapenem resist OXA 48 LIKE NOT DETECTED NOT DETECTED Final   Carbapenem resistance VIM NOT DETECTED NOT DETECTED Final    Comment: Performed at [facility], [address] [place], [place] [address]  Resp panel by RT-PCR (RSV, Flu A&B, Covid) Anterior Nasal Swab     Status: None   Collection Time: 09/19/23  9:00 AM   Specimen: Anterior Nasal Swab  Result Value Ref Range Status   SARS Coronavirus 2 by RT PCR NEGATIVE NEGATIVE Final    Comment: (NOTE) SARS-CoV-2 target nucleic acids are NOT DETECTED.  The SARS-CoV-2 RNA is generally detectable in upper respiratory specimens during the acute phase of infection. The lowest concentration of SARS-CoV-2 viral copies this assay can detect is 138 copies/mL. A negative result does not preclude SARS-Cov-2 infection and should not be used as the sole basis for treatment or other patient management decisions. A negative result may occur with  improper specimen collection/handling, submission of specimen other than nasopharyngeal swab, presence of viral mutation(s) within the areas targeted by this assay, and inadequate number of viral copies(<138 copies/mL). A negative result must be combined with clinical observations, patient history, and epidemiological information. The  expected result is Negative.  Fact Sheet for Patients:  BloggerCourse.com  Fact Sheet for Healthcare Providers:  SeriousBroker.it  This test is no t yet approved or cleared by the [place]  FDA and  has been authorized for detection and/or diagnosis of SARS-CoV-2 by FDA under an Emergency Use Authorization (EUA). This EUA will remain  in effect (meaning this test can be used) for the duration of the COVID-19 declaration under Section 564(b)(1) of the Act, 21 U.S.C.section 360bbb-3(b)(1), unless the authorization is terminated  or revoked sooner.       Influenza A by PCR NEGATIVE NEGATIVE Final   Influenza B by PCR NEGATIVE NEGATIVE Final    Comment: (NOTE) The Xpert Xpress SARS-CoV-2/FLU/RSV plus assay is intended as an aid in the diagnosis of influenza from Nasopharyngeal swab specimens and should not be used as a sole basis for treatment. Nasal washings and aspirates are unacceptable for Xpert Xpress SARS-CoV-2/FLU/RSV testing.  Fact Sheet for Patients: BloggerCourse.com  Fact Sheet for Healthcare Providers: SeriousBroker.it  This test is not yet approved or cleared by the [place]  FDA and has been authorized for detection and/or diagnosis of SARS-CoV-2 by FDA under an Emergency Use Authorization (EUA). This EUA will remain in effect (meaning this test can be used) for the duration of the COVID-19 declaration under Section 564(b)(1) of the Act, 21 U.S.C. section 360bbb-3(b)(1), unless the authorization is terminated or revoked.     Resp Syncytial Virus by PCR NEGATIVE NEGATIVE Final    Comment: (NOTE) Fact Sheet for Patients: BloggerCourse.com  Fact Sheet for Healthcare Providers: SeriousBroker.it  This test is not yet approved or cleared by the [place]  FDA and has been authorized for detection and/or  diagnosis of SARS-CoV-2 by FDA under an Emergency Use Authorization (EUA). This EUA will remain in effect (meaning this test can be used) for the duration of the COVID-19 declaration under Section 564(b)(1) of the Act, 21 U.S.C. section 360bbb-3(b)(1), unless the authorization is terminated or revoked.  Performed at [facility], [address], [place], [place] [address]      Radiology Studies: MR BRAIN WO CONTRAST Result Date: 09/19/2023 CLINICAL DATA:  Neuro deficit, acute, stroke suspected EXAM: MRI  HEAD WITHOUT CONTRAST TECHNIQUE: Multiplanar, multiecho pulse sequences of the brain and surrounding structures were obtained without intravenous contrast. COMPARISON:  CTA head/neck earlier today. FINDINGS: Brain: No acute infarction, hemorrhage, hydrocephalus, extra-axial collection or mass lesion. Sequela of prior hemorrhage in the anterior left temporal lobe with hemosiderin deposition and ossific flow malacia. Vascular: Normal flow voids. Skull and upper cervical spine: Normal marrow signal. Sinuses/Orbits: Clear sinuses.  No acute orbital findings. Other: No mastoid effusions. IMPRESSION: 1. No evidence of acute intracranial abnormality. 2. Sequela of remote hemorrhage in the anterior left temporal lobe. Electronically Signed   By: Stevenson Elbe M.D.   On: 09/19/2023 21:32   CT ANGIO HEAD NECK W WO CM Result Date: 09/19/2023 CLINICAL DATA:  78 year old male with TIA, dizziness. EXAM: CT ANGIOGRAPHY HEAD AND NECK WITH AND WITHOUT CONTRAST TECHNIQUE: Multidetector CT imaging of the head and neck was performed using the standard protocol during bolus administration of intravenous contrast. Multiplanar CT image reconstructions and MIPs were obtained to evaluate the vascular anatomy. Carotid stenosis measurements (when applicable) are obtained utilizing NASCET criteria, using the distal internal carotid diameter as the denominator. RADIATION DOSE REDUCTION: This exam was  performed according to the departmental dose-optimization program which includes automated exposure control, adjustment of the mA and/or kV according to patient size and/or use of iterative reconstruction technique. CONTRAST:  85mL OMNIPAQUE IOHEXOL 350 MG/ML SOLN COMPARISON:  Head CT 05/06/2023.  Brain MRI 06/29/2022. Neck CT 05/06/2023. FINDINGS: CT HEAD FINDINGS Brain: Chronic left temporal lobe encephalomalacia with dystrophic calcifications. Stable gray-white matter differentiation throughout the brain. Stable cerebral volume. No midline shift, ventriculomegaly, mass effect, evidence of mass lesion, intracranial hemorrhage or evidence of cortically based acute infarction. Gray-white matter differentiation is within normal limits throughout the brain. Stretch that Vascular: No suspicious intracranial vascular hyperdensity. Calcified atherosclerosis at the skull base. With Skull: Stable, intact. Sinuses/Orbits: Tympanic cavities, Visualized paranasal sinuses and mastoids are stable and well aerated. Other: Visualized orbits and scalp soft tissues are within normal limits. CTA NECK Skeleton: Absent dentition. Widespread cervical spine degeneration. No acute osseous abnormality identified. Upper chest: Negative. Other neck: Nonvascular neck soft tissue spaces are within normal limits. Aortic arch: Calcified aortic atherosclerosis.  3 vessel arch. Right carotid system: Brachiocephalic artery, right CCA origin calcified plaque. No significant stenosis, proximal right CCA detail degraded by dense subclavian venous contrast streak artifact. Right carotid bifurcation plaque with moderate to severe combined soft and calcified plaque of the proximal right ICA. Subsequent stenosis maximal at the distal bulb is high-grade and 65 to 70 % with respect to the distal vessel (series 9, image 198, series 10, image 150). The vessel remains patent with mild additional calcified plaque and also tortuosity below the skull base.  Numerically estimated at Left carotid system: Calcified left CCA origin plaque with 50 % stenosis with respect to the distal vessel. Calcified plaque at the left carotid bifurcation, ICA origin and bulb also resulting in 50 % stenosis with respect to the distal vessel. Tortuosity distal to the stenosis. Vertebral arteries: Right subclavian origin calcified plaque without stenosis. Calcified right vertebral artery origin, and occluded right vertebral origin or V1 segment. Highly diminutive right vertebral artery on February neck CT, and diminished distal right vertebral artery flow void on brain MRI last year, as well as a brain MRI 03/08/2014. No significant reconstituted enhancement by CTA today. Calcified proximal left subclavian artery plaque without significant stenosis. Patent left vertebral artery origin. Left vertebral artery is patent with tortuosity in the neck, no  stenosis to the skull base. CTA HEAD Posterior circulation: Distal left vertebral artery is patent and supplies the basilar. There is evidence of retrograde enhancement of the distal right vertebral artery and right PICA origin both of which appear atherosclerotic. No left V4 stenosis despite plaque. There is moderate proximal basilar artery plaque and stenosis series 13, image 27. Mild distal basilar irregularity. SCA and right PCA origin remain patent. Fetal type left PCA origin. Right posterior communicating artery diminutive or absent. Bilateral PCA branches are patent although irregular. Moderate left P2 and P3 segment stenosis. Mild right P2 segment stenosis. Anterior circulation: Both ICA siphons are patent. Cavernous left ICA calcified plaque and stenosis is moderate. Similar moderate left ICA supraclinoid plaque and stenosis. Normal left posterior communicating artery origin. Right ICA siphon calcified plaque with severe supraclinoid stenosis series 11, image 117. Distal right ICA remains patent. Patent carotid termini. Patent MCA and  ACA origins. But severe left ACA origin stenosis series 13, image 32. Anterior communicating artery and other ACA branches are patent. There is moderate irregularity and stenosis of the right ACA A2 on series 14, image 24. Left MCA M1 segment is patent with mild to moderate irregularity and stenosis (series 12, image 24). Patent left MCA bifurcation. Mild left MCA branch irregularity without occlusion. Contralateral right MCA M1 segment is patent with mild irregularity. Patent right MCA branches with mild irregularity. Venous sinuses: Patent. Anatomic variants: Fetal type left PCA origin. Review of the MIP images confirms the above findings IMPRESSION: 1. Negative for emergent large vessel occlusion. Positive for advanced atherosclerosis in the head and neck, most notable for: - chronic Right Vertebral Artery poor flow or occlusion. - Moderate stenosis proximal Basilar artery. - Tandem High-grade stenoses of the Right ICA: 65-70% Right ICA bulb stenosis and Severe supraclinoid Right ICA siphon stenosis. - Severe stenosis Left ACA origin. - 50% stenosis Left CCA origin and Left ICA bulb. - up to Moderate stenoses of the distal Left PCA, Right ACA A2, and Left MCA M1 segments. 2. No acute intracranial abnormality by CT. Chronic left temporal lobe encephalomalacia. 3.  Aortic Atherosclerosis (ICD10-I70.0). Electronically Signed   By: Marlise Simpers M.D.   On: 09/19/2023 11:33   CT ABDOMEN PELVIS W CONTRAST Result Date: 09/19/2023 CLINICAL DATA:  Dizzy this morning. History of vertigo. Prostate cancer. Seizures. Abdominal pain. EXAM: CT ABDOMEN AND PELVIS WITH CONTRAST TECHNIQUE: Multidetector CT imaging of the abdomen and pelvis was performed using the standard protocol following bolus administration of intravenous contrast. RADIATION DOSE REDUCTION: This exam was performed according to the departmental dose-optimization program which includes automated exposure control, adjustment of the mA and/or kV according to patient  size and/or use of iterative reconstruction technique. CONTRAST:  85mL OMNIPAQUE IOHEXOL 350 MG/ML SOLN COMPARISON:  02/07/2023. FINDINGS: Lower chest: Emphysema. Mild cardiomegaly, without pericardial or pleural effusion. Multivessel coronary artery atherosclerosis. Mild right hemidiaphragm elevation. Hepatobiliary: Normal liver. Dependent 21 mm gallstone without acute cholecystitis or biliary duct dilatation. Pancreas: Normal, without mass or ductal dilatation. Spleen: Normal in size, without focal abnormality. Adrenals/Urinary Tract: Normal adrenal glands. Upper pole left renal 2 mm lesion is too small to characterize but most likely a cyst . In the absence of clinically indicated signs/symptoms require(s) no independent follow-up. No hydronephrosis. The bladder is mildly thick walled with pericystic edema including on 86/7. Dependent density within the bladder is most likely early contrast excretion including on 87/7. Stomach/Bowel: Normal stomach, without wall thickening. Scattered colonic diverticula. Normal terminal ileum. Appendix not visualized. Normal  small bowel. Vascular/Lymphatic: Aortic atherosclerosis. No abdominopelvic adenopathy. Reproductive: Mild prostatomegaly.  Radiation seeds within. Other: No significant free fluid. No free intraperitoneal air. Small fat containing left inguinal hernia. Musculoskeletal: L4-5 fixation. L3 sclerotic lesion is present back to at least 2018 and can be presumed a bone island. IMPRESSION: 1. Bladder wall thickening with new pericystic edema, suspicious for cystitis. 2. No other acute process. 3. Hyperattenuation in the dependent bladder is favored to represent early contrast excretion. A new bladder stone since 02/07/2023 is felt less likely. 4. Cholelithiasis 5. Coronary artery atherosclerosis. Aortic Atherosclerosis (ICD10-I70.0). Emphysema (ICD10-J43.9). Electronically Signed   By: Lore Rode M.D.   On: 09/19/2023 11:12     Kathlen Para, MD, PhD [facility]  Between 7 am - 7 pm I am available, please contact me via Amion (for emergencies) or Securechat (non urgent messages)  Between 7 pm - 7 am I am not available, please contact night coverage MD/APP via Amion

## 2023-09-20 NOTE — Evaluation (Signed)
 Speech Language Pathology Evaluation Patient Details Name: Brandon Flores MRN: [id number] DOB: July 22, 1945 Today's Date: 09/20/2023 Time: 4098-1191 SLP Time Calculation (min) (ACUTE ONLY): 22 min  Problem List:  Patient Active Problem List   Diagnosis Date Noted   Sepsis (HCC) 09/19/2023   Acute cystitis with hematuria 09/19/2023   TIA (transient ischemic attack) 09/19/2023   Encephalomalacia 12/06/2021   Malignant neoplasm of prostate (HCC) 02/03/2020   Obesity (BMI 30-39.9) 08/31/2016   Chronic neck pain 08/31/2016   Cervical radiculopathy 05/03/2016   HLD (hyperlipidemia) 05/03/2016   GERD (gastroesophageal reflux disease) 04/25/2016   Hyperuricemia 04/25/2016   Low back pain without sciatica 04/25/2016   Degenerative arthritis of left knee 04/25/2016   Metatarsalgia of both feet 04/25/2016   Gout 04/25/2016   Migraine headache 04/25/2016   Nuclear sclerosis of both eyes 04/25/2016   History of colonic polyps 04/25/2016   VBI (vertebrobasilar insufficiency) 04/25/2016   Vertigo 03/10/2014   Generalized convulsive epilepsy (HCC) 11/12/2013   Choroidal nevus, right 10/12/2013   Vitreous floaters 10/12/2013   Past Medical History:  Past Medical History:  Diagnosis Date   Cervical radiculopathy    Colon polyp    (2006- adenoma and tubulovillous adenoma; 2010-adenomas; repeat 201)-Dr. Lincoln Renshaw   Complex partial seizure disorder (HCC) 10/08/2014   GERD (gastroesophageal reflux disease)    Gout    Hypercholesterolemia    Lateral epicondylitis    2002   Migraine    headaches   Ophthalmalgia    Dr. Gennie Kicks   Orthodontics    Dr. Armandina Bernard   Partial epilepsy with impairment of consciousness (HCC)    Plantar fasciitis    Prostate cancer (HCC)    Seizure disorder (HCC)    Dr. Annabell Key ([facility])   Seizures (HCC)    UTI (urinary tract infection)    Vertebrobasilar insufficiency    Vertigo    Past Surgical History:  Past Surgical History:  Procedure Laterality Date   BACK SURGERY      HEMORROIDECTOMY     NECK SURGERY     PROSTATE BIOPSY     HPI:  Brandon Flores is a 78 y.o. male with medical history significant of hyperlipidemia, GERD, hypothyroidism, gout, prostate cancer, obesity, epilepsy, vertebral basilar insufficiency syndrome, encephalomalacia presenting with dizziness (has had prio vertigo) and slurred speech. Found to have UTI and chart notes TIA. Chest x-ray without acute abnormality.  CT a of the head and neck showed no acute abnormality but did show chronic vertebrobasilar insufficiency with right ICA stenosis which was severe, severe left ACA stenosis, 50% left carotid stenosis, moderate left PCA stenosis. MRI No evidence of acute intracranial abnormality, sequela of remote hemorrhage in the anterior left temporal lobe.   Assessment / Plan / Recommendation Clinical Impression  Pt and wife state speech is back to baseline and report no deficits prior in language or cognition or current concerns. Pt's speech was 100% intelligible. His expressive and receptive language was appropriate. Pt tends to joke around and wife cued him to try to answer correctly. Pt given the SLUMS and scored an 18/30 indicative of significant impairments. Discussed results and pt and wife politely declined further cogntivie intervention. Wife states she feels he will improve once he is feeling better and back home. Wife does finances and pt manages his medications. Advised wife to supervise pt taking his meds/his organization of this who agreed. Also educated if cognitive deficits continue once home, MD would be able to order ST if pt/family desired.  SLP Assessment  SLP Recommendation/Assessment: Patient needs continued Speech Language Pathology Services (however pt and wife politely declined-see impression statement) SLP Visit Diagnosis: Cognitive communication deficit (R41.841)     Assistance Recommended at Discharge     Functional Status Assessment Patient has had a recent  decline in their functional status and demonstrates the ability to make significant improvements in function in a reasonable and predictable amount of time.  Frequency and Duration           SLP Evaluation Cognition  Overall Cognitive Status: Impaired/Different from baseline Arousal/Alertness: Awake/alert Orientation Level: Oriented to person;Oriented to place;Oriented to time Year: 2025 Day of Week: Correct Attention: Sustained Sustained Attention: Appears intact Memory: Impaired Memory Impairment: Storage deficit;Retrieval deficit (2/5 words) Awareness: Impaired Awareness Impairment:  (suspect intellectual) Problem Solving: Impaired Problem Solving Impairment: Functional basic;Verbal basic Safety/Judgment:  (questionable-needs supervision with meds)       Comprehension  Auditory Comprehension Overall Auditory Comprehension: Appears within functional limits for tasks assessed Visual Recognition/Discrimination Discrimination: Not tested Reading Comprehension Reading Status: Not tested    Expression Expression Primary Mode of Expression: Verbal Verbal Expression Overall Verbal Expression: Appears within functional limits for tasks assessed Initiation: No impairment Level of Generative/Spontaneous Verbalization: Conversation Repetition:  (NT) Naming: Impairment Divergent:  (9 animals one minute) Pragmatics: No impairment Written Expression Written Expression: Not tested   Oral / Motor  Oral Motor/Sensory Function Overall Oral Motor/Sensory Function: Within functional limits Motor Speech Overall Motor Speech: Appears within functional limits for tasks assessed Respiration: Within functional limits Phonation: Normal Resonance: Within functional limits Articulation: Within functional limitis Intelligibility: Intelligible Motor Planning: Within functional limits Motor Speech Errors: Not applicable            Brandon Flores 09/20/2023, 10:04 AM

## 2023-09-21 DIAGNOSIS — A4151 Sepsis due to Escherichia coli [E. coli]: Secondary | ICD-10-CM | POA: Diagnosis not present

## 2023-09-21 LAB — COMPREHENSIVE METABOLIC PANEL WITH GFR
ALT: 20 U/L (ref 0–44)
AST: 27 U/L (ref 15–41)
Albumin: 2.3 g/dL — ABNORMAL LOW (ref 3.5–5.0)
Alkaline Phosphatase: 40 U/L (ref 38–126)
Anion gap: 8 (ref 5–15)
BUN: 23 mg/dL (ref 8–23)
CO2: 23 mmol/L (ref 22–32)
Calcium: 7.9 mg/dL — ABNORMAL LOW (ref 8.9–10.3)
Chloride: 106 mmol/L (ref 98–111)
Creatinine, Ser: 1.48 mg/dL — ABNORMAL HIGH (ref 0.61–1.24)
GFR, Estimated: 48 mL/min — ABNORMAL LOW (ref >60)
Glucose, Bld: 142 mg/dL — ABNORMAL HIGH (ref 70–99)
Potassium: 3.7 mmol/L (ref 3.5–5.1)
Sodium: 137 mmol/L (ref 135–145)
Total Bilirubin: 0.4 mg/dL (ref 0.0–1.2)
Total Protein: 5.8 g/dL — ABNORMAL LOW (ref 6.5–8.1)

## 2023-09-21 LAB — CBC
HCT: 34.8 % — ABNORMAL LOW (ref 39.0–52.0)
Hemoglobin: 12 g/dL — ABNORMAL LOW (ref 13.0–17.0)
MCH: 31.9 pg (ref 26.0–34.0)
MCHC: 34.5 g/dL (ref 30.0–36.0)
MCV: 92.6 fL (ref 80.0–100.0)
Platelets: 130 10*3/uL — ABNORMAL LOW (ref 150–400)
RBC: 3.76 MIL/uL — ABNORMAL LOW (ref 4.22–5.81)
RDW: 13.4 % (ref 11.5–15.5)
WBC: 7 10*3/uL (ref 4.0–10.5)
nRBC: 0 % (ref 0.0–0.2)

## 2023-09-21 LAB — MAGNESIUM: Magnesium: 1.6 mg/dL — ABNORMAL LOW (ref 1.7–2.4)

## 2023-09-21 MED ORDER — MAGNESIUM SULFATE 2 GM/50ML IV SOLN
2.0000 g | Freq: Once | INTRAVENOUS | Status: AC
  Administered 2023-09-21: 2 g via INTRAVENOUS
  Filled 2023-09-21: qty 50

## 2023-09-21 NOTE — Progress Notes (Signed)
 Physical Therapy Treatment Patient Details Name: Brandon Flores MRN: [id number] DOB: 19-Sep-1945 Today's Date: 09/21/2023   History of Present Illness 78 year old male comes into the hospital with dizziness and slurred speech. PMH HLD, hypothyroidism, prostate cancer, BPH, vertebrobasilar insufficiency syndrome, encephalomalacia.    PT Comments  PT adjusts plan of care and goals based on significant regression in patient performance on this date. Pt reports a significant increase in feelings of instability, which he describes as feelings of himself spinning and not as the room spinning around him. The pt reports feeling increased sway when sitting and standing and with poor control of his balance, which is observed by PT during all mobility. Comments on formal vestibular assessment are noted below in general comments section of note, these results appear to be less of a peripheral issue and possible more related to a central nervous system deficits in my opinion. Pt is also notable for positive orthostatics. PT will continue to follow in an effort to improve balance and activity tolerance. Due to the significant change in mobility this PT updates recommendations to high intensity inpatient PT services at the time of discharge, rehab consult is requested.    If plan is discharge home, recommend the following: A lot of help with walking and/or transfers;A lot of help with bathing/dressing/bathroom;Assistance with cooking/housework;Assist for transportation;Help with stairs or ramp for entrance   Can travel by private vehicle        Equipment Recommendations  Wheelchair (measurements PT);BSC/3in1 (if discharging home)    Recommendations for Other Services Rehab consult     Precautions / Restrictions Precautions Precautions: Fall Recall of Precautions/Restrictions: Intact Restrictions Weight Bearing Restrictions Per Provider Order: No     Mobility  Bed Mobility Overal bed mobility: Needs  Assistance Bed Mobility: Supine to Sit, Sit to Supine     Supine to sit: Contact guard, HOB elevated Sit to supine: Contact guard assist        Transfers Overall transfer level: Needs assistance Equipment used: Rolling walker (2 wheels) Transfers: Sit to/from Stand Sit to Stand: Min assist           General transfer comment: posterior lean when standing    Ambulation/Gait Ambulation/Gait assistance: Min assist Gait Distance (Feet): 25 Feet Assistive device: Rolling walker (2 wheels) Gait Pattern/deviations: Step-to pattern, Trunk flexed, Ataxic Gait velocity: reduced Gait velocity interpretation: <1.31 ft/sec, indicative of household ambulator   General Gait Details: pt requiring cues to maintain RW close to BOS and to maintain body within walker frame when turning, lateral instability noted, pt often flexed over RW with downward gaze to the floor   Stairs             Wheelchair Mobility     Tilt Bed    Modified Rankin (Stroke Patients Only)       Balance Overall balance assessment: Needs assistance Sitting-balance support: Single extremity supported, Feet supported Sitting balance-Leahy Scale: Poor Sitting balance - Comments: intermittent posterior lean requiring CGA-minA to correct initially, improves throughout session to stand by assist   Standing balance support: Bilateral upper extremity supported, Reliant on assistive device for balance Standing balance-Leahy Scale: Poor                              Communication Communication Communication: Impaired Factors Affecting Communication: Reduced clarity of speech (mild dysarthria at times)  Cognition Arousal: Alert Behavior During Therapy: [facility] for tasks assessed/performed   PT - Cognitive  impairments: No apparent impairments                         Following commands: Intact      Cueing Cueing Techniques: Verbal cues, Tactile cues  Exercises      General Comments  General comments (skin integrity, edema, etc.): orthostatic vitals are positive, documented in vitals flowsheet. Vestibular assessment: pt reports feelings on internal sway and spinning when seated and standing. Pt reports the room is not spinning around himself but he feels that his body is swaying in all directions but more specifically posteriorly. Pt has persistent feelings of this throughout vestibular assessment. Assessment is also notable for nystagmus with end range lateral pursuits to both sides and saccadic corrections with VOR. Dix-hallpike and roll test bilaterally do not appear to alter symptoms at all during session. Pt also reports a history of syncope after these spells of instability, with the most recent fall being preceded by significant weakness and without feelings of internal sway.      Pertinent Vitals/Pain Pain Assessment Pain Assessment: No/denies pain    Home Living                          Prior Function            PT Goals (current goals can now be found in the care plan section) Acute Rehab PT Goals Patient Stated Goal: to go home PT Goal Formulation: With patient/family Time For Goal Achievement: 10/05/23 Potential to Achieve Goals: Fair Progress towards PT goals: Not progressing toward goals - comment;Goals downgraded-see care plan    Frequency    Min 3X/week      PT Plan      Co-evaluation              AM-PAC PT 6 Clicks Mobility   Outcome Measure  Help needed turning from your back to your side while in a flat bed without using bedrails?: A Little Help needed moving from lying on your back to sitting on the side of a flat bed without using bedrails?: A Little Help needed moving to and from a bed to a chair (including a wheelchair)?: A Little Help needed standing up from a chair using your arms (e.g., wheelchair or bedside chair)?: A Little Help needed to walk in hospital room?: A Little Help needed climbing 3-5 steps with a  railing? : Total 6 Click Score: 16    End of Session Equipment Utilized During Treatment: Gait belt Activity Tolerance: Patient tolerated treatment well Patient left: in bed;with call bell/phone within reach;with bed alarm set Nurse Communication: Mobility status PT Visit Diagnosis: Other abnormalities of gait and mobility (R26.89);Dizziness and giddiness (R42)     Time: 1244-1410 PT Time Calculation (min) (ACUTE ONLY): 86 min  Charges:    $Gait Training: 8-22 mins $Therapeutic Activity: 23-37 mins PT General Charges $$ ACUTE PT VISIT: 1 Visit                     Bernardino JINNY Ruth, PT, DPT Acute Rehabilitation Office [phone]    Bernardino JINNY Ruth 09/21/2023, 5:53 PM

## 2023-09-21 NOTE — Progress Notes (Signed)
 PROGRESS NOTE  Brandon Flores FMW:[id number] DOB: 1945/11/17 DOA: 09/19/2023 PCP: Hughie Sharper, MD   LOS: 1 day   Brief Narrative / Interim history: 78 year old male with HLD, hypothyroidism, prostate cancer, BPH, vertebrobasilar insufficiency syndrome, encephalomalacia comes into the hospital with dizziness and slurred speech.  He reports feeling well when he went to bed tonight prior to admission, but woke up around 3 AM and went to the bathroom, felt very dizzy similar to his prior vertigo episodes.  He was so weak with this dizziness that was unable to get back into his bed and ended up slipping on the floor.  He was brought to the hospital by family.  When he presented to the ED he was found to be febrile to 102, tachycardic, tachypneic.  He was satting well on room air.  Urinalysis had some evidence of infection.  He was admitted for presumed UTI versus TIA rule out  Subjective / 24h Interval events: Felt better with therapy yesterday, but again weak this morning.  Had another high fever last night associated with significant chills  Assesement and Plan: Principal Problem:   Sepsis (HCC) Active Problems:   GERD (gastroesophageal reflux disease)   Gout   VBI (vertebrobasilar insufficiency)   Generalized convulsive epilepsy (HCC)   HLD (hyperlipidemia)   Obesity (BMI 30-39.9)   Malignant neoplasm of prostate (HCC)   Acute cystitis with hematuria   TIA (transient ischemic attack)  Principal problem Sepsis due to E. coli bacteremia, suspect underlying UTI -patient admitted to the hospital with fever of 102, tachycardia, tachypnea.  Blood cultures overnight speciated in 2 out of 2 bottles E. coli which appears to not have any significant resistance.  He is on ceftriaxone , continue for now while awaiting sensitivities - Still febrile, but overall he appears better.  Continue IV antibiotics  Active problems UTI-on ceftriaxone  as above.  Monitor urine cultures  Hypoxemia, wheezing  -intermittent he experiences upper airway wheezing, this has been happening at home for the past year.  Will need PFTs as an outpatient.  Continue supplemental oxygen, wean off to room air as tolerated.  2D echocardiogram done 6/20 shows LVEF 60-65%, no WMA, grade 1 diastolic dysfunction, normal RV.  Vertigo, slurred speech, concern for TIA -workup underway, MRI on admission was negative for stroke, did show sequela of remote hemorrhage in the anterior left temporal lobe.  I have asked PT to do a vestibular evaluation  History of seizures-continue Tegretol   Gout-continue home allopurinol   Hypothyroidism-continue Synthroid   History of prostate cancer-wearing depends at home, continue dutasteride , has a degree of incontinence  Scheduled Meds:  allopurinol   100 mg Oral Daily   aspirin   300 mg Rectal Daily   Or   aspirin   325 mg Oral Daily   carbamazepine   400 mg Oral TID   dutasteride   0.5 mg Oral QPM   levothyroxine   112 mcg Oral Q0600   sodium chloride  flush  3 mL Intravenous Q12H   tamsulosin   0.8 mg Oral QPC supper   Continuous Infusions:  cefTRIAXone  (ROCEPHIN )  IV Stopped (09/20/23 1310)   magnesium  sulfate bolus IVPB     PRN Meds:.acetaminophen  **OR** acetaminophen , ipratropium-albuterol , meclizine , mouth rinse, polyethylene glycol  Current Outpatient Medications  Medication Instructions   allopurinol  (ZYLOPRIM ) 100 mg, Oral, Daily   carbamazepine  (TEGRETOL ) 400 mg, Oral, 3 times daily, WILL TRY SWITCH TO GENERIC   dutasteride  (AVODART ) 0.5 mg, Oral, Every evening   levothyroxine  (SYNTHROID ) 112 mcg, Daily   meclizine  (ANTIVERT ) 25 mg, Oral,  3 times daily PRN   meloxicam (MOBIC) 15 mg, Daily   oxyCODONE -acetaminophen  (PERCOCET/ROXICET) 5-325 MG tablet 1 tablet, Oral, Every 8 hours PRN   tamsulosin  (FLOMAX ) 0.8 mg, Oral, Daily after supper    Diet Orders (From admission, onward)     Start     Ordered   09/19/23 1730  Diet regular Room service appropriate? Yes; Fluid  consistency: Thin  Diet effective now       Comments: After swallow screen  Question Answer Comment  Room service appropriate? Yes   Fluid consistency: Thin      09/19/23 1643            DVT prophylaxis: SCDs Start: 09/19/23 1641   Lab Results  Component Value Date   PLT 130 (L) 09/21/2023      Code Status: Full Code  Family Communication: Wife at bedside  Status is: Inpatient   Level of care: Telemetry Medical  Consultants:  None  Objective: Vitals:   09/20/23 2212 09/21/23 0237 09/21/23 0600 09/21/23 0753  BP:  (!) 130/90  136/82  Pulse:  94  79  Resp:  (!) 21    Temp: 98.4 F (36.9 C) 99 F (37.2 C) 99 F (37.2 C) 98.3 F (36.8 C)  TempSrc: Oral Oral Oral Oral  SpO2:  98%  99%    Intake/Output Summary (Last 24 hours) at 09/21/2023 1033 Last data filed at 09/21/2023 0230 Gross per 24 hour  Intake 343 ml  Output 200 ml  Net 143 ml   Wt Readings from Last 3 Encounters:  06/13/23 107.5 kg  09/08/22 106.6 kg  06/13/22 108.9 kg    Examination:  Constitutional: NAD Eyes: lids and conjunctivae normal, no scleral icterus ENMT: mmm Neck: normal, supple Respiratory: clear to auscultation bilaterally, no wheezing, no crackles. Normal respiratory effort.  Cardiovascular: Regular rate and rhythm, no murmurs / rubs / gallops. No LE edema. Abdomen: soft, no distention, no tenderness. Bowel sounds positive.    Data Reviewed: I have independently reviewed following labs and imaging studies   CBC Recent Labs  Lab 09/19/23 0900 09/20/23 0540 09/21/23 0657  WBC 11.9* 8.5 7.0  HGB 14.4 12.3* 12.0*  HCT 41.6 36.0* 34.8*  PLT 199 156 130*  MCV 91.0 92.1 92.6  MCH 31.5 31.5 31.9  MCHC 34.6 34.2 34.5  RDW 13.2 13.3 13.4  LYMPHSABS 1.0  --   --   MONOABS 1.7*  --   --   EOSABS 0.0  --   --   BASOSABS 0.0  --   --     Recent Labs  Lab 09/19/23 0900 09/20/23 0540 09/21/23 0657  NA 140 137 137  K 3.6 3.4* 3.7  CL 101 105 106  CO2 23 22 23    GLUCOSE 135* 148* 142*  BUN 21 20 23   CREATININE 1.30* 1.40* 1.48*  CALCIUM 9.3 7.7* 7.9*  AST 18 26 27   ALT 15 16 20   ALKPHOS 87 48 40  BILITOT 1.1 0.9 0.4  ALBUMIN 3.9 2.5* 2.3*  MG  --   --  1.6*  LATICACIDVEN 1.4  --   --   INR 1.1  --   --   HGBA1C  --  5.0  --     ------------------------------------------------------------------------------------------------------------------ Recent Labs    09/20/23 0540  CHOL 186  HDL 27*  LDLCALC 132*  TRIG 136  CHOLHDL 6.9    Lab Results  Component Value Date   HGBA1C 5.0 09/20/2023   ------------------------------------------------------------------------------------------------------------------ No results for  input(s): TSH, T4TOTAL, T3FREE, THYROIDAB in the last 72 hours.  Invalid input(s): FREET3  Cardiac Enzymes No results for input(s): CKMB, TROPONINI, MYOGLOBIN in the last 168 hours.  Invalid input(s): CK ------------------------------------------------------------------------------------------------------------------ No results found for: BNP  CBG: No results for input(s): GLUCAP in the last 168 hours.  Recent Results (from the past 240 hours)  Blood Culture (routine x 2)     Status: Abnormal (Preliminary result)   Collection Time: 09/19/23  8:41 AM   Specimen: BLOOD  Result Value Ref Range Status   Specimen Description   Final    BLOOD RIGHT ANTECUBITAL Performed at [facility], [address] [place], [place] [address]    Special Requests   Final    BOTTLES DRAWN AEROBIC AND ANAEROBIC Blood Culture adequate volume Performed at [facility], [address] [place], [place] [address]    Culture  Setup Time   Final    GRAM NEGATIVE RODS IN BOTH AEROBIC AND ANAEROBIC BOTTLES CRITICAL RESULT CALLED TO, READ BACK BY AND VERIFIED WITH: MAYA JACQULIN ORN [id number] FCP    Culture (A)  Final    ESCHERICHIA COLI SUSCEPTIBILITIES TO  FOLLOW Performed at [facility], [address] [place], [place] [address]    Report Status PENDING  Incomplete  Blood Culture ID Panel (Reflexed)     Status: Abnormal   Collection Time: 09/19/23  8:41 AM  Result Value Ref Range Status   Enterococcus faecalis NOT DETECTED NOT DETECTED Final   Enterococcus Faecium NOT DETECTED NOT DETECTED Final   Listeria monocytogenes NOT DETECTED NOT DETECTED Final   Staphylococcus species NOT DETECTED NOT DETECTED Final   Staphylococcus aureus (BCID) NOT DETECTED NOT DETECTED Final   Staphylococcus epidermidis NOT DETECTED NOT DETECTED Final   Staphylococcus lugdunensis NOT DETECTED NOT DETECTED Final   Streptococcus species NOT DETECTED NOT DETECTED Final   Streptococcus agalactiae NOT DETECTED NOT DETECTED Final   Streptococcus pneumoniae NOT DETECTED NOT DETECTED Final   Streptococcus pyogenes NOT DETECTED NOT DETECTED Final   A.calcoaceticus-baumannii NOT DETECTED NOT DETECTED Final   Bacteroides fragilis NOT DETECTED NOT DETECTED Final   Enterobacterales DETECTED (A) NOT DETECTED Final    Comment: Enterobacterales represent a large order of gram negative bacteria, not a single organism. CRITICAL RESULT CALLED TO, READ BACK BY AND VERIFIED WITH: PHARMD JACQULIN ORN [id number] FCP    Enterobacter cloacae complex NOT DETECTED NOT DETECTED Final   Escherichia coli DETECTED (A) NOT DETECTED Final    Comment: CRITICAL RESULT CALLED TO, READ BACK BY AND VERIFIED WITH: PHARMD JACQULIN ORN [id number] FCP    Klebsiella aerogenes NOT DETECTED NOT DETECTED Final   Klebsiella oxytoca NOT DETECTED NOT DETECTED Final   Klebsiella pneumoniae NOT DETECTED NOT DETECTED Final   Proteus species NOT DETECTED NOT DETECTED Final   Salmonella species NOT DETECTED NOT DETECTED Final   Serratia marcescens NOT DETECTED NOT DETECTED Final   Haemophilus influenzae NOT DETECTED NOT DETECTED Final   Neisseria meningitidis NOT DETECTED NOT DETECTED Final   Pseudomonas  aeruginosa NOT DETECTED NOT DETECTED Final   Stenotrophomonas maltophilia NOT DETECTED NOT DETECTED Final   Candida albicans NOT DETECTED NOT DETECTED Final   Candida auris NOT DETECTED NOT DETECTED Final   Candida glabrata NOT DETECTED NOT DETECTED Final   Candida krusei NOT DETECTED NOT DETECTED Final   Candida parapsilosis NOT DETECTED NOT DETECTED Final   Candida tropicalis NOT DETECTED NOT DETECTED Final   Cryptococcus neoformans/gattii NOT DETECTED NOT  DETECTED Final   CTX-M ESBL NOT DETECTED NOT DETECTED Final   Carbapenem resistance IMP NOT DETECTED NOT DETECTED Final   Carbapenem resistance KPC NOT DETECTED NOT DETECTED Final   Carbapenem resistance NDM NOT DETECTED NOT DETECTED Final   Carbapenem resist OXA 48 LIKE NOT DETECTED NOT DETECTED Final   Carbapenem resistance VIM NOT DETECTED NOT DETECTED Final    Comment: Performed at [facility], [address] [place], [place] [address]  Resp panel by RT-PCR (RSV, Flu A&B, Covid) Anterior Nasal Swab     Status: None   Collection Time: 09/19/23  9:00 AM   Specimen: Anterior Nasal Swab  Result Value Ref Range Status   SARS Coronavirus 2 by RT PCR NEGATIVE NEGATIVE Final    Comment: (NOTE) SARS-CoV-2 target nucleic acids are NOT DETECTED.  The SARS-CoV-2 RNA is generally detectable in upper respiratory specimens during the acute phase of infection. The lowest concentration of SARS-CoV-2 viral copies this assay can detect is 138 copies/mL. A negative result does not preclude SARS-Cov-2 infection and should not be used as the sole basis for treatment or other patient management decisions. A negative result may occur with  improper specimen collection/handling, submission of specimen other than nasopharyngeal swab, presence of viral mutation(s) within the areas targeted by this assay, and inadequate number of viral copies(<138 copies/mL). A negative result must be combined with clinical observations, patient history, and  epidemiological information. The expected result is Negative.  Fact Sheet for Patients:  BloggerCourse.com  Fact Sheet for Healthcare Providers:  SeriousBroker.it  This test is no t yet approved or cleared by the [place]  FDA and  has been authorized for detection and/or diagnosis of SARS-CoV-2 by FDA under an Emergency Use Authorization (EUA). This EUA will remain  in effect (meaning this test can be used) for the duration of the COVID-19 declaration under Section 564(b)(1) of the Act, 21 U.S.C.section 360bbb-3(b)(1), unless the authorization is terminated  or revoked sooner.       Influenza A by PCR NEGATIVE NEGATIVE Final   Influenza B by PCR NEGATIVE NEGATIVE Final    Comment: (NOTE) The Xpert Xpress SARS-CoV-2/FLU/RSV plus assay is intended as an aid in the diagnosis of influenza from Nasopharyngeal swab specimens and should not be used as a sole basis for treatment. Nasal washings and aspirates are unacceptable for Xpert Xpress SARS-CoV-2/FLU/RSV testing.  Fact Sheet for Patients: BloggerCourse.com  Fact Sheet for Healthcare Providers: SeriousBroker.it  This test is not yet approved or cleared by the [place]  FDA and has been authorized for detection and/or diagnosis of SARS-CoV-2 by FDA under an Emergency Use Authorization (EUA). This EUA will remain in effect (meaning this test can be used) for the duration of the COVID-19 declaration under Section 564(b)(1) of the Act, 21 U.S.C. section 360bbb-3(b)(1), unless the authorization is terminated or revoked.     Resp Syncytial Virus by PCR NEGATIVE NEGATIVE Final    Comment: (NOTE) Fact Sheet for Patients: BloggerCourse.com  Fact Sheet for Healthcare Providers: SeriousBroker.it  This test is not yet approved or cleared by the [place]  FDA and has been  authorized for detection and/or diagnosis of SARS-CoV-2 by FDA under an Emergency Use Authorization (EUA). This EUA will remain in effect (meaning this test can be used) for the duration of the COVID-19 declaration under Section 564(b)(1) of the Act, 21 U.S.C. section 360bbb-3(b)(1), unless the authorization is terminated or revoked.  Performed at [facility], [address] [place], [place] [address]  Radiology Studies: ECHOCARDIOGRAM COMPLETE Result Date: 09/20/2023    ECHOCARDIOGRAM REPORT   Patient Name:   WLLIAM GROSSO Flores Date of Exam: 09/20/2023 Medical Rec #:  [id number]       Height:       69.0 in Accession #:    [id number]      Weight:       237.0 lb Date of Birth:  12/27/1945       BSA:          2.221 m Patient Age:    78 years        BP:           130/70 mmHg Patient Gender: M               HR:           83 bpm. Exam Location:  Inpatient Procedure: 2D Echo, Cardiac Doppler, Color Doppler and Intracardiac            Opacification Agent (Both Spectral and Color Flow Doppler were            utilized during procedure). Indications:    TIA  History:        Patient has no prior history of Echocardiogram examinations.  Sonographer:    Vella Key Referring Phys: [id number] ALEXANDER B MELVIN IMPRESSIONS  1. Left ventricular ejection fraction, by estimation, is 60 to 65%. The left ventricle has normal function. The left ventricle has no regional wall motion abnormalities. There is mild left ventricular hypertrophy. Left ventricular diastolic parameters are consistent with Grade I diastolic dysfunction (impaired relaxation).  2. Right ventricular systolic function is normal. The right ventricular size is normal.  3. No bubble study performed.  4. The mitral valve is normal in structure. No evidence of mitral valve regurgitation. No evidence of mitral stenosis.  5. The aortic valve is tricuspid. There is moderate calcification of the aortic valve. There is moderate thickening of the  aortic valve. Aortic valve regurgitation is not visualized. Aortic valve sclerosis is present, with no evidence of aortic valve stenosis.  6. The inferior vena cava is normal in size with greater than 50% respiratory variability, suggesting right atrial pressure of 3 mmHg. FINDINGS  Left Ventricle: Left ventricular ejection fraction, by estimation, is 60 to 65%. The left ventricle has normal function. The left ventricle has no regional wall motion abnormalities. Strain was performed and the global longitudinal strain is indeterminate. The left ventricular internal cavity size was normal in size. There is mild left ventricular hypertrophy. Left ventricular diastolic parameters are consistent with Grade I diastolic dysfunction (impaired relaxation). Right Ventricle: The right ventricular size is normal. No increase in right ventricular wall thickness. Right ventricular systolic function is normal. Left Atrium: Left atrial size was normal in size. Right Atrium: Right atrial size was normal in size. Pericardium: There is no evidence of pericardial effusion. Mitral Valve: The mitral valve is normal in structure. No evidence of mitral valve regurgitation. No evidence of mitral valve stenosis. Tricuspid Valve: The tricuspid valve is normal in structure. Tricuspid valve regurgitation is mild . No evidence of tricuspid stenosis. Aortic Valve: The aortic valve is tricuspid. There is moderate calcification of the aortic valve. There is moderate thickening of the aortic valve. Aortic valve regurgitation is not visualized. Aortic valve sclerosis is present, with no evidence of aortic valve stenosis. Pulmonic Valve: The pulmonic valve was normal in structure. Pulmonic valve regurgitation is trivial. No evidence of pulmonic stenosis. Aorta: The aortic root  is normal in size and structure. Venous: The inferior vena cava is normal in size with greater than 50% respiratory variability, suggesting right atrial pressure of 3 mmHg.  IAS/Shunts: No atrial level shunt detected by color flow Doppler. Additional Comments: 3D was performed not requiring image post processing on an independent workstation and was indeterminate.  LEFT VENTRICLE PLAX 2D LVIDd:         4.30 cm     Diastology LVIDs:         2.50 cm     LV e' medial:    5.44 cm/s LV PW:         1.20 cm     LV E/e' medial:  11.0 LV IVS:        1.30 cm     LV e' lateral:   9.68 cm/s LVOT diam:     2.00 cm     LV E/e' lateral: 6.2 LV SV:         68 LV SV Index:   31 LVOT Area:     3.14 cm  LV Volumes (MOD) LV vol d, MOD A2C: 59.3 ml LV vol d, MOD A4C: 67.6 ml LV vol s, MOD A2C: 16.4 ml LV vol s, MOD A4C: 22.6 ml LV SV MOD A2C:     42.9 ml LV SV MOD A4C:     67.6 ml LV SV MOD BP:      43.5 ml RIGHT VENTRICLE RV Basal diam:  3.90 cm RV S prime:     13.20 cm/s TAPSE (M-mode): 1.5 cm LEFT ATRIUM             Index        RIGHT ATRIUM           Index LA diam:        3.70 cm 1.67 cm/m   RA Area:     16.40 cm LA Vol (A2C):   67.7 ml 30.49 ml/m  RA Volume:   47.30 ml  21.30 ml/m LA Vol (A4C):   49.9 ml 22.47 ml/m LA Biplane Vol: 60.9 ml 27.43 ml/m  AORTIC VALVE LVOT Vmax:   110.00 cm/s LVOT Vmean:  78.200 cm/s LVOT VTI:    0.216 m  AORTA Ao Root diam: 3.50 cm Ao Asc diam:  3.70 cm MITRAL VALVE MV Area (PHT): 3.03 cm    SHUNTS MV Decel Time: 250 msec    Systemic VTI:  0.22 m MV E velocity: 59.70 cm/s  Systemic Diam: 2.00 cm MV A velocity: 84.40 cm/s MV E/A ratio:  0.71 Maude Emmer MD Electronically signed by Maude Emmer MD Signature Date/Time: 09/20/2023/3:06:38 PM    Final      Nilda Fendt, MD, PhD [facility]  Between 7 am - 7 pm I am available, please contact me via Amion (for emergencies) or Securechat (non urgent messages)  Between 7 pm - 7 am I am not available, please contact night coverage MD/APP via Amion

## 2023-09-21 NOTE — Plan of Care (Signed)
  Problem: Education: Goal: Knowledge of General Education information will improve Description Including pain rating scale, medication(s)/side effects and non-pharmacologic comfort measures Outcome: Progressing   Problem: Clinical Measurements: Goal: Respiratory complications will improve Outcome: Not Progressing   Problem: Activity: Goal: Risk for activity intolerance will decrease Outcome: Not Progressing   

## 2023-09-22 DIAGNOSIS — A4151 Sepsis due to Escherichia coli [E. coli]: Secondary | ICD-10-CM | POA: Diagnosis not present

## 2023-09-22 NOTE — Plan of Care (Signed)
   Problem: Education: Goal: Knowledge of General Education information will improve Description Including pain rating scale, medication(s)/side effects and non-pharmacologic comfort measures Outcome: Progressing

## 2023-09-22 NOTE — Progress Notes (Signed)
Inpatient Rehab Admissions Coordinator:   Per therapy recommendations,  patient was screened for CIR candidacy by Megan Salon, MS, CCC-SLP. At this time, Pt. Appears to be a a potential candidate for CIR. I will place   order for rehab consult per protocol for full assessment. Please contact me any with questions.  Megan Salon, MS, CCC-SLP Rehab Admissions Coordinator  [phone] (office)   Megan Salon, MS, CCC-SLP Rehab Admissions Coordinator  [phone] (office)

## 2023-09-22 NOTE — Progress Notes (Addendum)
 PROGRESS NOTE  Brandon Flores FMW:[id number] DOB: 06-05-1945 DOA: 09/19/2023 PCP: Hughie Sharper, MD   LOS: 2 days   Brief Narrative / Interim history: 78 year old male with HLD, hypothyroidism, prostate cancer, BPH, vertebrobasilar insufficiency syndrome, encephalomalacia comes into the hospital with dizziness and slurred speech.  He reports feeling well when he went to bed tonight prior to admission, but woke up around 3 AM and went to the bathroom, felt very dizzy similar to his prior vertigo episodes.  He was so weak with this dizziness that was unable to get back into his bed and ended up slipping on the floor.  He was brought to the hospital by family.  When he presented to the ED he was found to be febrile to 102, tachycardic, tachypneic.  He was satting well on room air.  Urinalysis had some evidence of infection.  He was admitted for presumed UTI versus TIA rule out  Subjective / 24h Interval events: Complains of having night sweats last night.  No further fevers though.  Continues to feel very weak  Assesement and Plan: Principal Problem:   Sepsis (HCC) Active Problems:   GERD (gastroesophageal reflux disease)   Gout   VBI (vertebrobasilar insufficiency)   Generalized convulsive epilepsy (HCC)   HLD (hyperlipidemia)   Obesity (BMI 30-39.9)   Malignant neoplasm of prostate (HCC)   Acute cystitis with hematuria   TIA (transient ischemic attack)  Principal problem Sepsis due to E. coli bacteremia, suspect underlying UTI -patient admitted to the hospital with fever of 102, tachycardia, tachypnea.  Blood cultures overnight speciated in 2 out of 2 bottles E. coli which appears to not have any significant resistance.  He is on ceftriaxone , continue IV as he is still having low-grade temps.  Sensitivity shows intermediate resistance to cefazolin but otherwise pansensitive - Fever curve improving.  Continue IV antibiotics  Active problems UTI-on ceftriaxone  as above.  Monitor urine  cultures  Hypoxemia, wheezing -intermittent he experiences upper airway wheezing, this has been happening at home for the past year.  Will need PFTs as an outpatient.  Continue supplemental oxygen, wean off to room air as tolerated.  2D echocardiogram done 6/20 shows LVEF 60-65%, no WMA, grade 1 diastolic dysfunction, normal RV.  Vertigo, slurred speech, concern for TIA -workup underway, MRI on admission was negative for stroke, did show sequela of remote hemorrhage in the anterior left temporal lobe.  She had PT follow-up  Hypomagnesemia-replenish magnesium , continue to monitor  History of seizures-continue Tegretol   Gout-continue home allopurinol   Hypothyroidism-continue Synthroid   History of prostate cancer-wearing depends at home, continue dutasteride , has a degree of incontinence  Scheduled Meds:  allopurinol   100 mg Oral Daily   aspirin   300 mg Rectal Daily   Or   aspirin   325 mg Oral Daily   carbamazepine   400 mg Oral TID   dutasteride   0.5 mg Oral QPM   levothyroxine   112 mcg Oral Q0600   sodium chloride  flush  3 mL Intravenous Q12H   tamsulosin   0.8 mg Oral QPC supper   Continuous Infusions:  cefTRIAXone  (ROCEPHIN )  IV 2 g (09/21/23 1247)   PRN Meds:.acetaminophen  **OR** acetaminophen , ipratropium-albuterol , meclizine , mouth rinse, polyethylene glycol  Current Outpatient Medications  Medication Instructions   allopurinol  (ZYLOPRIM ) 100 mg, Oral, Daily   carbamazepine  (TEGRETOL ) 400 mg, Oral, 3 times daily, WILL TRY SWITCH TO GENERIC   dutasteride  (AVODART ) 0.5 mg, Oral, Every evening   levothyroxine  (SYNTHROID ) 112 mcg, Daily   meclizine  (ANTIVERT ) 25 mg, Oral,  3 times daily PRN   meloxicam (MOBIC) 15 mg, Daily   oxyCODONE -acetaminophen  (PERCOCET/ROXICET) 5-325 MG tablet 1 tablet, Oral, Every 8 hours PRN   tamsulosin  (FLOMAX ) 0.8 mg, Oral, Daily after supper    Diet Orders (From admission, onward)     Start     Ordered   09/19/23 1730  Diet regular Room service  appropriate? Yes; Fluid consistency: Thin  Diet effective now       Comments: After swallow screen  Question Answer Comment  Room service appropriate? Yes   Fluid consistency: Thin      09/19/23 1643            DVT prophylaxis: SCDs Start: 09/19/23 1641   Lab Results  Component Value Date   PLT 130 (L) 09/21/2023      Code Status: Full Code  Family Communication: Wife at bedside  Status is: Inpatient   Level of care: Telemetry Medical  Consultants:  None  Objective: Vitals:   09/21/23 2200 09/21/23 2341 09/22/23 0424 09/22/23 0749  BP:  (!) 142/73 (!) 148/84 (!) 143/80  Pulse:  85 89 80  Resp:  18 (!) 21 18  Temp: 99.7 F (37.6 C) 99.5 F (37.5 C) 99.8 F (37.7 C) 98.7 F (37.1 C)  TempSrc: Oral Oral Oral Oral  SpO2:  94% 95% 99%    Intake/Output Summary (Last 24 hours) at 09/22/2023 1032 Last data filed at 09/22/2023 0630 Gross per 24 hour  Intake 78.44 ml  Output 250 ml  Net -171.56 ml   Wt Readings from Last 3 Encounters:  06/13/23 107.5 kg  09/08/22 106.6 kg  06/13/22 108.9 kg    Examination:  Constitutional: NAD Eyes: lids and conjunctivae normal, no scleral icterus ENMT: mmm Neck: normal, supple Respiratory: clear to auscultation bilaterally, no wheezing, no crackles.  Cardiovascular: Regular rate and rhythm, no murmurs / rubs / gallops. No LE edema. Abdomen: soft, no distention, no tenderness. Bowel sounds positive.    Data Reviewed: I have independently reviewed following labs and imaging studies   CBC Recent Labs  Lab 09/19/23 0900 09/20/23 0540 09/21/23 0657  WBC 11.9* 8.5 7.0  HGB 14.4 12.3* 12.0*  HCT 41.6 36.0* 34.8*  PLT 199 156 130*  MCV 91.0 92.1 92.6  MCH 31.5 31.5 31.9  MCHC 34.6 34.2 34.5  RDW 13.2 13.3 13.4  LYMPHSABS 1.0  --   --   MONOABS 1.7*  --   --   EOSABS 0.0  --   --   BASOSABS 0.0  --   --     Recent Labs  Lab 09/19/23 0900 09/20/23 0540 09/21/23 0657  NA 140 137 137  K 3.6 3.4* 3.7  CL  101 105 106  CO2 23 22 23   GLUCOSE 135* 148* 142*  BUN 21 20 23   CREATININE 1.30* 1.40* 1.48*  CALCIUM 9.3 7.7* 7.9*  AST 18 26 27   ALT 15 16 20   ALKPHOS 87 48 40  BILITOT 1.1 0.9 0.4  ALBUMIN 3.9 2.5* 2.3*  MG  --   --  1.6*  LATICACIDVEN 1.4  --   --   INR 1.1  --   --   HGBA1C  --  5.0  --     ------------------------------------------------------------------------------------------------------------------ Recent Labs    09/20/23 0540  CHOL 186  HDL 27*  LDLCALC 132*  TRIG 136  CHOLHDL 6.9    Lab Results  Component Value Date   HGBA1C 5.0 09/20/2023   ------------------------------------------------------------------------------------------------------------------ No results for input(s):  TSH, T4TOTAL, T3FREE, THYROIDAB in the last 72 hours.  Invalid input(s): FREET3  Cardiac Enzymes No results for input(s): CKMB, TROPONINI, MYOGLOBIN in the last 168 hours.  Invalid input(s): CK ------------------------------------------------------------------------------------------------------------------ No results found for: BNP  CBG: No results for input(s): GLUCAP in the last 168 hours.  Recent Results (from the past 240 hours)  Blood Culture (routine x 2)     Status: Abnormal (Preliminary result)   Collection Time: 09/19/23  8:41 AM   Specimen: BLOOD  Result Value Ref Range Status   Specimen Description   Final    BLOOD RIGHT ANTECUBITAL Performed at [facility], [address] [place], [place] [address]    Special Requests   Final    BOTTLES DRAWN AEROBIC AND ANAEROBIC Blood Culture adequate volume Performed at [facility], [address], [place], [place] [address]    Culture  Setup Time   Final    GRAM NEGATIVE RODS IN BOTH AEROBIC AND ANAEROBIC BOTTLES CRITICAL RESULT CALLED TO, READ BACK BY AND VERIFIED WITH: MAYA JACQULIN ORN [phone] FCP Performed at [facility], [address] [place], [place] [address]    Culture ESCHERICHIA COLI (A)  Final   Report Status PENDING  Incomplete   Organism ID, Bacteria ESCHERICHIA COLI  Final   Organism ID, Bacteria ESCHERICHIA COLI  Final      Susceptibility   Escherichia coli - KIRBY BAUER*    CEFAZOLIN INTERMEDIATE Intermediate    Escherichia coli - MIC*    AMPICILLIN 8 SENSITIVE Sensitive     CEFEPIME <=0.12 SENSITIVE Sensitive     CEFTAZIDIME <=1 SENSITIVE Sensitive     CEFTRIAXONE  <=0.25 SENSITIVE Sensitive     CIPROFLOXACIN <=0.25 SENSITIVE Sensitive     GENTAMICIN <=1 SENSITIVE Sensitive     IMIPENEM <=0.25 SENSITIVE Sensitive     TRIMETH /SULFA  <=20 SENSITIVE Sensitive     AMPICILLIN/SULBACTAM <=2 SENSITIVE Sensitive     PIP/TAZO <=4 SENSITIVE Sensitive ug/mL    * ESCHERICHIA COLI    ESCHERICHIA COLI  Blood Culture ID Panel (Reflexed)     Status: Abnormal   Collection Time: 09/19/23  8:41 AM  Result Value Ref Range Status   Enterococcus faecalis NOT DETECTED NOT DETECTED Final   Enterococcus Faecium NOT DETECTED NOT DETECTED Final   Listeria monocytogenes NOT DETECTED NOT DETECTED Final   Staphylococcus species NOT DETECTED NOT DETECTED Final   Staphylococcus aureus (BCID) NOT DETECTED NOT DETECTED Final   Staphylococcus epidermidis NOT DETECTED NOT DETECTED Final   Staphylococcus lugdunensis NOT DETECTED NOT DETECTED Final   Streptococcus species NOT DETECTED NOT DETECTED Final   Streptococcus agalactiae NOT DETECTED NOT DETECTED Final   Streptococcus pneumoniae NOT DETECTED NOT DETECTED Final   Streptococcus pyogenes NOT DETECTED NOT DETECTED Final   A.calcoaceticus-baumannii NOT DETECTED NOT DETECTED Final   Bacteroides fragilis NOT DETECTED NOT DETECTED Final   Enterobacterales DETECTED (A) NOT DETECTED Final    Comment: Enterobacterales represent a large order of gram negative bacteria, not a single organism. CRITICAL RESULT CALLED TO, READ BACK BY AND VERIFIED WITH: PHARMD JACQULIN ORN [phone] FCP     Enterobacter cloacae complex NOT DETECTED NOT DETECTED Final   Escherichia coli DETECTED (A) NOT DETECTED Final    Comment: CRITICAL RESULT CALLED TO, READ BACK BY AND VERIFIED WITH: PHARMD JACQULIN ORN [phone] FCP    Klebsiella aerogenes NOT DETECTED NOT DETECTED Final   Klebsiella oxytoca NOT DETECTED NOT DETECTED Final   Klebsiella pneumoniae NOT DETECTED  NOT DETECTED Final   Proteus species NOT DETECTED NOT DETECTED Final   Salmonella species NOT DETECTED NOT DETECTED Final   Serratia marcescens NOT DETECTED NOT DETECTED Final   Haemophilus influenzae NOT DETECTED NOT DETECTED Final   Neisseria meningitidis NOT DETECTED NOT DETECTED Final   Pseudomonas aeruginosa NOT DETECTED NOT DETECTED Final   Stenotrophomonas maltophilia NOT DETECTED NOT DETECTED Final   Candida albicans NOT DETECTED NOT DETECTED Final   Candida auris NOT DETECTED NOT DETECTED Final   Candida glabrata NOT DETECTED NOT DETECTED Final   Candida krusei NOT DETECTED NOT DETECTED Final   Candida parapsilosis NOT DETECTED NOT DETECTED Final   Candida tropicalis NOT DETECTED NOT DETECTED Final   Cryptococcus neoformans/gattii NOT DETECTED NOT DETECTED Final   CTX-M ESBL NOT DETECTED NOT DETECTED Final   Carbapenem resistance IMP NOT DETECTED NOT DETECTED Final   Carbapenem resistance KPC NOT DETECTED NOT DETECTED Final   Carbapenem resistance NDM NOT DETECTED NOT DETECTED Final   Carbapenem resist OXA 48 LIKE NOT DETECTED NOT DETECTED Final   Carbapenem resistance VIM NOT DETECTED NOT DETECTED Final    Comment: Performed at [facility], [address], [place], [place] [address]  Resp panel by RT-PCR (RSV, Flu A&B, Covid) Anterior Nasal Swab     Status: None   Collection Time: 09/19/23  9:00 AM   Specimen: Anterior Nasal Swab  Result Value Ref Range Status   SARS Coronavirus 2 by RT PCR NEGATIVE NEGATIVE Final    Comment: (NOTE) SARS-CoV-2 target nucleic acids are NOT DETECTED.  The SARS-CoV-2 RNA is  generally detectable in upper respiratory specimens during the acute phase of infection. The lowest concentration of SARS-CoV-2 viral copies this assay can detect is 138 copies/mL. A negative result does not preclude SARS-Cov-2 infection and should not be used as the sole basis for treatment or other patient management decisions. A negative result may occur with  improper specimen collection/handling, submission of specimen other than nasopharyngeal swab, presence of viral mutation(s) within the areas targeted by this assay, and inadequate number of viral copies(<138 copies/mL). A negative result must be combined with clinical observations, patient history, and epidemiological information. The expected result is Negative.  Fact Sheet for Patients:  BloggerCourse.com  Fact Sheet for Healthcare Providers:  SeriousBroker.it  This test is no t yet approved or cleared by the [place]  FDA and  has been authorized for detection and/or diagnosis of SARS-CoV-2 by FDA under an Emergency Use Authorization (EUA). This EUA will remain  in effect (meaning this test can be used) for the duration of the COVID-19 declaration under Section 564(b)(1) of the Act, 21 U.S.C.section 360bbb-3(b)(1), unless the authorization is terminated  or revoked sooner.       Influenza A by PCR NEGATIVE NEGATIVE Final   Influenza B by PCR NEGATIVE NEGATIVE Final    Comment: (NOTE) The Xpert Xpress SARS-CoV-2/FLU/RSV plus assay is intended as an aid in the diagnosis of influenza from Nasopharyngeal swab specimens and should not be used as a sole basis for treatment. Nasal washings and aspirates are unacceptable for Xpert Xpress SARS-CoV-2/FLU/RSV testing.  Fact Sheet for Patients: BloggerCourse.com  Fact Sheet for Healthcare Providers: SeriousBroker.it  This test is not yet approved or cleared by the [place] FDA and has been authorized for detection and/or diagnosis of SARS-CoV-2 by FDA under an Emergency Use Authorization (EUA). This EUA will remain in effect (meaning this test can be used) for the duration of the COVID-19 declaration under  Section 564(b)(1) of the Act, 21 U.S.C. section 360bbb-3(b)(1), unless the authorization is terminated or revoked.     Resp Syncytial Virus by PCR NEGATIVE NEGATIVE Final    Comment: (NOTE) Fact Sheet for Patients: BloggerCourse.com  Fact Sheet for Healthcare Providers: SeriousBroker.it  This test is not yet approved or cleared by the [place]  FDA and has been authorized for detection and/or diagnosis of SARS-CoV-2 by FDA under an Emergency Use Authorization (EUA). This EUA will remain in effect (meaning this test can be used) for the duration of the COVID-19 declaration under Section 564(b)(1) of the Act, 21 U.S.C. section 360bbb-3(b)(1), unless the authorization is terminated or revoked.  Performed at [facility], [address], [place], [place] [address]      Radiology Studies: No results found.    Nilda Fendt, MD, PhD [facility]  Between 7 am - 7 pm I am available, please contact me via Amion (for emergencies) or Securechat (non urgent messages)  Between 7 pm - 7 am I am not available, please contact night coverage MD/APP via Amion

## 2023-09-22 NOTE — Plan of Care (Signed)

## 2023-09-22 NOTE — Progress Notes (Signed)
 A consult was placed to IV Therapy for new IV access;  pt getting washed up at 1505;  will return to see pt at a later time.

## 2023-09-23 ENCOUNTER — Encounter (HOSPITAL_COMMUNITY): Payer: Self-pay | Admitting: Internal Medicine

## 2023-09-23 ENCOUNTER — Other Ambulatory Visit: Payer: Self-pay

## 2023-09-23 ENCOUNTER — Inpatient Hospital Stay (HOSPITAL_COMMUNITY)
Admission: AD | Admit: 2023-09-23 | Discharge: 2023-09-28 | DRG: 945 | Disposition: A | Discharge status: Home / Self Care | Source: Intra-hospital | Attending: Physical Medicine and Rehabilitation | Admitting: Physical Medicine and Rehabilitation

## 2023-09-23 ENCOUNTER — Encounter (HOSPITAL_COMMUNITY): Payer: Self-pay | Admitting: Physical Medicine and Rehabilitation

## 2023-09-23 DIAGNOSIS — Z87891 Personal history of nicotine dependence: Secondary | ICD-10-CM

## 2023-09-23 DIAGNOSIS — Z791 Long term (current) use of non-steroidal anti-inflammatories (NSAID): Secondary | ICD-10-CM

## 2023-09-23 DIAGNOSIS — A419 Sepsis, unspecified organism: Secondary | ICD-10-CM | POA: Diagnosis not present

## 2023-09-23 DIAGNOSIS — N179 Acute kidney failure, unspecified: Secondary | ICD-10-CM | POA: Diagnosis present

## 2023-09-23 DIAGNOSIS — R350 Frequency of micturition: Secondary | ICD-10-CM | POA: Diagnosis present

## 2023-09-23 DIAGNOSIS — G40909 Epilepsy, unspecified, not intractable, without status epilepticus: Secondary | ICD-10-CM

## 2023-09-23 DIAGNOSIS — G894 Chronic pain syndrome: Secondary | ICD-10-CM

## 2023-09-23 DIAGNOSIS — N3090 Cystitis, unspecified without hematuria: Secondary | ICD-10-CM | POA: Diagnosis present

## 2023-09-23 DIAGNOSIS — N3941 Urge incontinence: Secondary | ICD-10-CM | POA: Diagnosis present

## 2023-09-23 DIAGNOSIS — E7800 Pure hypercholesterolemia, unspecified: Secondary | ICD-10-CM | POA: Diagnosis present

## 2023-09-23 DIAGNOSIS — N390 Urinary tract infection, site not specified: Secondary | ICD-10-CM

## 2023-09-23 DIAGNOSIS — Z8673 Personal history of transient ischemic attack (TIA), and cerebral infarction without residual deficits: Secondary | ICD-10-CM | POA: Diagnosis not present

## 2023-09-23 DIAGNOSIS — G40209 Localization-related (focal) (partial) symptomatic epilepsy and epileptic syndromes with complex partial seizures, not intractable, without status epilepticus: Secondary | ICD-10-CM | POA: Diagnosis present

## 2023-09-23 DIAGNOSIS — Z801 Family history of malignant neoplasm of trachea, bronchus and lung: Secondary | ICD-10-CM

## 2023-09-23 DIAGNOSIS — D696 Thrombocytopenia, unspecified: Secondary | ICD-10-CM | POA: Diagnosis present

## 2023-09-23 DIAGNOSIS — M5030 Other cervical disc degeneration, unspecified cervical region: Secondary | ICD-10-CM | POA: Diagnosis present

## 2023-09-23 DIAGNOSIS — G450 Vertebro-basilar artery syndrome: Secondary | ICD-10-CM | POA: Diagnosis present

## 2023-09-23 DIAGNOSIS — G9389 Other specified disorders of brain: Secondary | ICD-10-CM | POA: Diagnosis present

## 2023-09-23 DIAGNOSIS — G40309 Generalized idiopathic epilepsy and epileptic syndromes, not intractable, without status epilepticus: Secondary | ICD-10-CM | POA: Diagnosis present

## 2023-09-23 DIAGNOSIS — M5412 Radiculopathy, cervical region: Secondary | ICD-10-CM | POA: Diagnosis present

## 2023-09-23 DIAGNOSIS — A4151 Sepsis due to Escherichia coli [E. coli]: Secondary | ICD-10-CM | POA: Diagnosis present

## 2023-09-23 DIAGNOSIS — Z825 Family history of asthma and other chronic lower respiratory diseases: Secondary | ICD-10-CM | POA: Diagnosis not present

## 2023-09-23 DIAGNOSIS — Z8546 Personal history of malignant neoplasm of prostate: Secondary | ICD-10-CM | POA: Diagnosis not present

## 2023-09-23 DIAGNOSIS — Z79890 Hormone replacement therapy (postmenopausal): Secondary | ICD-10-CM | POA: Diagnosis not present

## 2023-09-23 DIAGNOSIS — K219 Gastro-esophageal reflux disease without esophagitis: Secondary | ICD-10-CM | POA: Diagnosis present

## 2023-09-23 DIAGNOSIS — R5381 Other malaise: Principal | ICD-10-CM | POA: Diagnosis present

## 2023-09-23 DIAGNOSIS — I10 Essential (primary) hypertension: Secondary | ICD-10-CM | POA: Diagnosis present

## 2023-09-23 DIAGNOSIS — M109 Gout, unspecified: Secondary | ICD-10-CM | POA: Diagnosis present

## 2023-09-23 DIAGNOSIS — K5900 Constipation, unspecified: Secondary | ICD-10-CM | POA: Diagnosis present

## 2023-09-23 DIAGNOSIS — H8110 Benign paroxysmal vertigo, unspecified ear: Secondary | ICD-10-CM | POA: Diagnosis present

## 2023-09-23 DIAGNOSIS — Z79899 Other long term (current) drug therapy: Secondary | ICD-10-CM

## 2023-09-23 DIAGNOSIS — G8929 Other chronic pain: Secondary | ICD-10-CM | POA: Diagnosis present

## 2023-09-23 LAB — BASIC METABOLIC PANEL WITH GFR
Anion gap: 9 (ref 5–15)
BUN: 20 mg/dL (ref 8–23)
CO2: 22 mmol/L (ref 22–32)
Calcium: 8.1 mg/dL — ABNORMAL LOW (ref 8.9–10.3)
Chloride: 110 mmol/L (ref 98–111)
Creatinine, Ser: 1.28 mg/dL — ABNORMAL HIGH (ref 0.61–1.24)
GFR, Estimated: 58 mL/min — ABNORMAL LOW (ref >60)
Glucose, Bld: 163 mg/dL — ABNORMAL HIGH (ref 70–99)
Potassium: 3.7 mmol/L (ref 3.5–5.1)
Sodium: 141 mmol/L (ref 135–145)

## 2023-09-23 LAB — CBC
HCT: 36.6 % — ABNORMAL LOW (ref 39.0–52.0)
Hemoglobin: 12.2 g/dL — ABNORMAL LOW (ref 13.0–17.0)
MCH: 31 pg (ref 26.0–34.0)
MCHC: 33.3 g/dL (ref 30.0–36.0)
MCV: 93.1 fL (ref 80.0–100.0)
Platelets: 166 10*3/uL (ref 150–400)
RBC: 3.93 MIL/uL — ABNORMAL LOW (ref 4.22–5.81)
RDW: 13.6 % (ref 11.5–15.5)
WBC: 4.7 10*3/uL (ref 4.0–10.5)
nRBC: 0 % (ref 0.0–0.2)

## 2023-09-23 LAB — MAGNESIUM: Magnesium: 2 mg/dL (ref 1.7–2.4)

## 2023-09-23 MED ORDER — POLYETHYLENE GLYCOL 3350 17 G PO PACK
17.0000 g | PACK | Freq: Every day | ORAL | Status: DC
  Administered 2023-09-23 – 2023-09-28 (×6): 17 g via ORAL
  Filled 2023-09-23 (×7): qty 1

## 2023-09-23 MED ORDER — AMOXICILLIN-POT CLAVULANATE 875-125 MG PO TABS
1.0000 | ORAL_TABLET | Freq: Two times a day (BID) | ORAL | Status: DC
  Administered 2023-09-24 – 2023-09-28 (×9): 1 via ORAL
  Filled 2023-09-23 (×9): qty 1

## 2023-09-23 MED ORDER — ALLOPURINOL 100 MG PO TABS
100.0000 mg | ORAL_TABLET | Freq: Every day | ORAL | Status: DC
  Administered 2023-09-24 – 2023-09-28 (×5): 100 mg via ORAL
  Filled 2023-09-23 (×5): qty 1

## 2023-09-23 MED ORDER — SODIUM CHLORIDE 0.9 % IV SOLN: 2.0000 g | INTRAVENOUS | Status: DC

## 2023-09-23 MED ORDER — DUTASTERIDE 0.5 MG PO CAPS
0.5000 mg | ORAL_CAPSULE | Freq: Every evening | ORAL | Status: DC
  Administered 2023-09-23 – 2023-09-27 (×5): 0.5 mg via ORAL
  Filled 2023-09-23 (×5): qty 1

## 2023-09-23 MED ORDER — CARBAMAZEPINE 200 MG PO TABS
400.0000 mg | ORAL_TABLET | Freq: Three times a day (TID) | ORAL | Status: DC
  Administered 2023-09-23 – 2023-09-28 (×14): 400 mg via ORAL
  Filled 2023-09-23 (×15): qty 2

## 2023-09-23 MED ORDER — BISACODYL 10 MG RE SUPP: 10.0000 mg | Freq: Every day | RECTAL | Status: DC | PRN

## 2023-09-23 MED ORDER — GUAIFENESIN-DM 100-10 MG/5ML PO SYRP: 5.0000 mL | ORAL_SOLUTION | Freq: Four times a day (QID) | ORAL | Status: DC | PRN

## 2023-09-23 MED ORDER — ACETAMINOPHEN 325 MG PO TABS: 325.0000 mg | ORAL_TABLET | ORAL | Status: DC | PRN

## 2023-09-23 MED ORDER — MECLIZINE HCL 25 MG PO TABS: 25.0000 mg | ORAL_TABLET | Freq: Three times a day (TID) | ORAL | Status: DC | PRN

## 2023-09-23 MED ORDER — SORBITOL 70 % SOLN
60.0000 mL | Freq: Every day | Status: DC | PRN
  Administered 2023-09-24: 60 mL via ORAL
  Filled 2023-09-23: qty 60

## 2023-09-23 MED ORDER — PROCHLORPERAZINE EDISYLATE 10 MG/2ML IJ SOLN: 5.0000 mg | Freq: Four times a day (QID) | INTRAMUSCULAR | Status: DC | PRN

## 2023-09-23 MED ORDER — LIDOCAINE HCL URETHRAL/MUCOSAL 2 % EX GEL: CUTANEOUS | Status: DC | PRN

## 2023-09-23 MED ORDER — PROCHLORPERAZINE MALEATE 5 MG PO TABS: 5.0000 mg | ORAL_TABLET | Freq: Four times a day (QID) | ORAL | Status: DC | PRN

## 2023-09-23 MED ORDER — AMOXICILLIN-POT CLAVULANATE 875-125 MG PO TABS: 1.0000 | ORAL_TABLET | Freq: Two times a day (BID) | ORAL | Status: DC

## 2023-09-23 MED ORDER — ALUM & MAG HYDROXIDE-SIMETH 200-200-20 MG/5ML PO SUSP: 30.0000 mL | ORAL | Status: DC | PRN

## 2023-09-23 MED ORDER — LEVOTHYROXINE SODIUM 112 MCG PO TABS
112.0000 ug | ORAL_TABLET | Freq: Every day | ORAL | Status: DC
  Administered 2023-09-24 – 2023-09-28 (×5): 112 ug via ORAL
  Filled 2023-09-23 (×6): qty 1

## 2023-09-23 MED ORDER — MELATONIN 5 MG PO TABS: 5.0000 mg | ORAL_TABLET | Freq: Every evening | ORAL | Status: DC | PRN

## 2023-09-23 MED ORDER — ENOXAPARIN SODIUM 40 MG/0.4ML IJ SOSY
40.0000 mg | PREFILLED_SYRINGE | INTRAMUSCULAR | Status: DC
  Administered 2023-09-23 – 2023-09-25 (×3): 40 mg via SUBCUTANEOUS
  Filled 2023-09-23 (×3): qty 0.4

## 2023-09-23 MED ORDER — ASPIRIN 81 MG PO TBEC
81.0000 mg | DELAYED_RELEASE_TABLET | Freq: Every day | ORAL | Status: DC
  Administered 2023-09-24 – 2023-09-28 (×5): 81 mg via ORAL
  Filled 2023-09-23 (×5): qty 1

## 2023-09-23 MED ORDER — DIPHENHYDRAMINE HCL 25 MG PO CAPS: 25.0000 mg | ORAL_CAPSULE | Freq: Four times a day (QID) | ORAL | Status: DC | PRN

## 2023-09-23 MED ORDER — IPRATROPIUM-ALBUTEROL 0.5-2.5 (3) MG/3ML IN SOLN: 3.0000 mL | Freq: Four times a day (QID) | RESPIRATORY_TRACT | Status: DC | PRN

## 2023-09-23 MED ORDER — FLEET ENEMA RE ENEM: 1.0000 | ENEMA | Freq: Once | RECTAL | Status: DC | PRN

## 2023-09-23 MED ORDER — PROCHLORPERAZINE 25 MG RE SUPP: 12.5000 mg | Freq: Four times a day (QID) | RECTAL | Status: DC | PRN

## 2023-09-23 MED ORDER — TAMSULOSIN HCL 0.4 MG PO CAPS
0.8000 mg | ORAL_CAPSULE | Freq: Every day | ORAL | Status: DC
  Administered 2023-09-23 – 2023-09-27 (×5): 0.8 mg via ORAL
  Filled 2023-09-23 (×5): qty 2

## 2023-09-23 NOTE — Progress Notes (Deleted)
 PROGRESS NOTE  Brandon Flores Sr FMW:[id number] DOB: [birth year] DOA: 09/19/2023 PCP: Hughie Sharper, MD   LOS: 3 days   Brief Narrative / Interim history: 78 year old male with HLD, hypothyroidism, prostate cancer, BPH, vertebrobasilar insufficiency syndrome, encephalomalacia comes into the hospital with dizziness and slurred speech.  He reports feeling well when he went to bed tonight prior to admission, but woke up around 3 AM and went to the bathroom, felt very dizzy similar to his prior vertigo episodes.  He was so weak with this dizziness that was unable to get back into his bed and ended up slipping on the floor.  He was brought to the hospital by family.  When he presented to the ED he was found to be febrile to 102, tachycardic, tachypneic.  He was satting well on room air.  Urinalysis had some evidence of infection.  He was admitted for presumed UTI versus TIA rule out  Subjective / 24h Interval events: No more fevers last night.  He is feeling a little bit stronger.  No nausea or vomiting  Assesement and Plan: Principal Problem:   Sepsis (HCC) Active Problems:   GERD (gastroesophageal reflux disease)   Gout   VBI (vertebrobasilar insufficiency)   Generalized convulsive epilepsy (HCC)   HLD (hyperlipidemia)   Obesity (BMI 30-39.9)   Malignant neoplasm of prostate (HCC)   Acute cystitis with hematuria   TIA (transient ischemic attack)  Principal problem Sepsis due to E. coli bacteremia, suspect underlying UTI -patient admitted to the hospital with fever of 102, tachycardia, tachypnea.  Blood cultures overnight speciated in 2 out of 2 bottles E. coli which appears to not have any significant resistance.  He is on ceftriaxone , continue IV as he is still having low-grade temps.  Sensitivity shows intermediate resistance to cefazolin but otherwise pansensitive - Fever appears resolved.  Keep on IV antibiotics till  Active problems UTI-on ceftriaxone  as above.   Hypoxemia, wheezing  -intermittent he experiences upper airway wheezing, this has been happening at home for the past year.  Will need PFTs as an outpatient.  Continue supplemental oxygen, wean off to room air as tolerated.  2D echocardiogram done 6/20 shows LVEF 60-65%, no WMA, grade 1 diastolic dysfunction, normal RV.  Vertigo, slurred speech, concern for TIA -workup underway, MRI on admission was negative for stroke, did show sequela of remote hemorrhage in the anterior left temporal lobe.  She had PT follow-up  Hypomagnesemia-replenish magnesium , continue to monitor  History of seizures-continue Tegretol   Gout-continue home allopurinol   Hypothyroidism-continue Synthroid   History of prostate cancer-wearing depends at home, continue dutasteride , has a degree of incontinence  Scheduled Meds:  allopurinol   100 mg Oral Daily   aspirin   300 mg Rectal Daily   Or   aspirin   325 mg Oral Daily   carbamazepine   400 mg Oral TID   dutasteride   0.5 mg Oral QPM   levothyroxine   112 mcg Oral Q0600   sodium chloride  flush  3 mL Intravenous Q12H   tamsulosin   0.8 mg Oral QPC supper   Continuous Infusions:  cefTRIAXone  (ROCEPHIN )  IV 2 g (09/22/23 1311)   PRN Meds:.acetaminophen  **OR** acetaminophen , ipratropium-albuterol , meclizine , mouth rinse, polyethylene glycol  Current Outpatient Medications  Medication Instructions   allopurinol  (ZYLOPRIM ) 100 mg, Oral, Daily   carbamazepine  (TEGRETOL ) 400 mg, Oral, 3 times daily, WILL TRY SWITCH TO GENERIC   dutasteride  (AVODART ) 0.5 mg, Oral, Every evening   levothyroxine  (SYNTHROID ) 112 mcg, Daily   meclizine  (ANTIVERT ) 25 mg, Oral, 3  times daily PRN   meloxicam (MOBIC) 15 mg, Daily   oxyCODONE -acetaminophen  (PERCOCET/ROXICET) 5-325 MG tablet 1 tablet, Oral, Every 8 hours PRN   tamsulosin  (FLOMAX ) 0.8 mg, Oral, Daily after supper    Diet Orders (From admission, onward)     Start     Ordered   09/19/23 1730  Diet regular Room service appropriate? Yes; Fluid  consistency: Thin  Diet effective now       Comments: After swallow screen  Question Answer Comment  Room service appropriate? Yes   Fluid consistency: Thin      09/19/23 1643            DVT prophylaxis: SCDs Start: 09/19/23 1641   Lab Results  Component Value Date   PLT 166 09/23/2023      Code Status: Full Code  Family Communication: Wife at bedside  Status is: Inpatient   Level of care: Telemetry Medical  Consultants:  None  Objective: Vitals:   09/22/23 2042 09/22/23 2354 09/23/23 0418 09/23/23 0735  BP: (!) 147/85 128/68 139/78 130/81  Pulse: 76 78 80 68  Resp: 20     Temp: 98.3 F (36.8 C) 98.5 F (36.9 C) 98.4 F (36.9 C) 98.3 F (36.8 C)  TempSrc: Oral Oral Oral Oral  SpO2: 96% 97% 96% 99%    Intake/Output Summary (Last 24 hours) at 09/23/2023 0930 Last data filed at 09/23/2023 0736 Gross per 24 hour  Intake --  Output 850 ml  Net -850 ml   Wt Readings from Last 3 Encounters:  06/13/23 107.5 kg  09/08/22 106.6 kg  06/13/22 108.9 kg    Examination:  Constitutional: NAD Eyes: lids and conjunctivae normal, no scleral icterus ENMT: mmm Neck: normal, supple Respiratory: clear to auscultation bilaterally, no wheezing, no crackles. Normal respiratory effort.  Cardiovascular: Regular rate and rhythm, no murmurs / rubs / gallops. No LE edema. Abdomen: soft, no distention, no tenderness. Bowel sounds positive.    Data Reviewed: I have independently reviewed following labs and imaging studies   CBC Recent Labs  Lab 09/19/23 0900 09/20/23 0540 09/21/23 0657 09/23/23 0450  WBC 11.9* 8.5 7.0 4.7  HGB 14.4 12.3* 12.0* 12.2*  HCT 41.6 36.0* 34.8* 36.6*  PLT 199 156 130* 166  MCV 91.0 92.1 92.6 93.1  MCH 31.5 31.5 31.9 31.0  MCHC 34.6 34.2 34.5 33.3  RDW 13.2 13.3 13.4 13.6  LYMPHSABS 1.0  --   --   --   MONOABS 1.7*  --   --   --   EOSABS 0.0  --   --   --   BASOSABS 0.0  --   --   --     Recent Labs  Lab 09/19/23 0900  09/20/23 0540 09/21/23 0657 09/23/23 0450  NA 140 137 137 141  K 3.6 3.4* 3.7 3.7  CL 101 105 106 110  CO2 23 22 23 22   GLUCOSE 135* 148* 142* 163*  BUN 21 20 23 20   CREATININE 1.30* 1.40* 1.48* 1.28*  CALCIUM 9.3 7.7* 7.9* 8.1*  AST 18 26 27   --   ALT 15 16 20   --   ALKPHOS 87 48 40  --   BILITOT 1.1 0.9 0.4  --   ALBUMIN 3.9 2.5* 2.3*  --   MG  --   --  1.6* 2.0  LATICACIDVEN 1.4  --   --   --   INR 1.1  --   --   --   HGBA1C  --  5.0  --   --     ------------------------------------------------------------------------------------------------------------------ No results for input(s): CHOL, HDL, LDLCALC, TRIG, CHOLHDL, LDLDIRECT in the last 72 hours.   Lab Results  Component Value Date   HGBA1C 5.0 09/20/2023   ------------------------------------------------------------------------------------------------------------------ No results for input(s): TSH, T4TOTAL, T3FREE, THYROIDAB in the last 72 hours.  Invalid input(s): FREET3  Cardiac Enzymes No results for input(s): CKMB, TROPONINI, MYOGLOBIN in the last 168 hours.  Invalid input(s): CK ------------------------------------------------------------------------------------------------------------------ No results found for: BNP  CBG: No results for input(s): GLUCAP in the last 168 hours.  Recent Results (from the past 240 hours)  Blood Culture (routine x 2)     Status: Abnormal (Preliminary result)   Collection Time: 09/19/23  8:41 AM   Specimen: BLOOD  Result Value Ref Range Status   Specimen Description   Final    BLOOD RIGHT ANTECUBITAL Performed at [facility], [address], [place], [place] [address]    Special Requests   Final    BOTTLES DRAWN AEROBIC AND ANAEROBIC Blood Culture adequate volume Performed at [facility], [address] [place], [place] [address]    Culture  Setup Time   Final    GRAM NEGATIVE RODS IN BOTH  AEROBIC AND ANAEROBIC BOTTLES CRITICAL RESULT CALLED TO, READ BACK BY AND VERIFIED WITH: MAYA JACQULIN ORN [id number] FCP Performed at [facility], [address], [place], [place] [address]    Culture ESCHERICHIA COLI (A)  Final   Report Status PENDING  Incomplete   Organism ID, Bacteria ESCHERICHIA COLI  Final   Organism ID, Bacteria ESCHERICHIA COLI  Final      Susceptibility   Escherichia coli - KIRBY BAUER*    CEFAZOLIN INTERMEDIATE Intermediate    Escherichia coli - MIC*    AMPICILLIN 8 SENSITIVE Sensitive     CEFEPIME <=0.12 SENSITIVE Sensitive     CEFTAZIDIME <=1 SENSITIVE Sensitive     CEFTRIAXONE  <=0.25 SENSITIVE Sensitive     CIPROFLOXACIN <=0.25 SENSITIVE Sensitive     GENTAMICIN <=1 SENSITIVE Sensitive     IMIPENEM <=0.25 SENSITIVE Sensitive     TRIMETH /SULFA  <=20 SENSITIVE Sensitive     AMPICILLIN/SULBACTAM <=2 SENSITIVE Sensitive     PIP/TAZO <=4 SENSITIVE Sensitive ug/mL    * ESCHERICHIA COLI    ESCHERICHIA COLI  Blood Culture ID Panel (Reflexed)     Status: Abnormal   Collection Time: 09/19/23  8:41 AM  Result Value Ref Range Status   Enterococcus faecalis NOT DETECTED NOT DETECTED Final   Enterococcus Faecium NOT DETECTED NOT DETECTED Final   Listeria monocytogenes NOT DETECTED NOT DETECTED Final   Staphylococcus species NOT DETECTED NOT DETECTED Final   Staphylococcus aureus (BCID) NOT DETECTED NOT DETECTED Final   Staphylococcus epidermidis NOT DETECTED NOT DETECTED Final   Staphylococcus lugdunensis NOT DETECTED NOT DETECTED Final   Streptococcus species NOT DETECTED NOT DETECTED Final   Streptococcus agalactiae NOT DETECTED NOT DETECTED Final   Streptococcus pneumoniae NOT DETECTED NOT DETECTED Final   Streptococcus pyogenes NOT DETECTED NOT DETECTED Final   A.calcoaceticus-baumannii NOT DETECTED NOT DETECTED Final   Bacteroides fragilis NOT DETECTED NOT DETECTED Final   Enterobacterales DETECTED (A) NOT DETECTED Final    Comment: Enterobacterales  represent a large order of gram negative bacteria, not a single organism. CRITICAL RESULT CALLED TO, READ BACK BY AND VERIFIED WITH: PHARMD JACQULIN ORN [id number] FCP    Enterobacter cloacae complex NOT DETECTED NOT DETECTED Final   Escherichia coli DETECTED (A) NOT DETECTED  Final    Comment: CRITICAL RESULT CALLED TO, READ BACK BY AND VERIFIED WITH: PHARMD JACQULIN ORN [id number] FCP    Klebsiella aerogenes NOT DETECTED NOT DETECTED Final   Klebsiella oxytoca NOT DETECTED NOT DETECTED Final   Klebsiella pneumoniae NOT DETECTED NOT DETECTED Final   Proteus species NOT DETECTED NOT DETECTED Final   Salmonella species NOT DETECTED NOT DETECTED Final   Serratia marcescens NOT DETECTED NOT DETECTED Final   Haemophilus influenzae NOT DETECTED NOT DETECTED Final   Neisseria meningitidis NOT DETECTED NOT DETECTED Final   Pseudomonas aeruginosa NOT DETECTED NOT DETECTED Final   Stenotrophomonas maltophilia NOT DETECTED NOT DETECTED Final   Candida albicans NOT DETECTED NOT DETECTED Final   Candida auris NOT DETECTED NOT DETECTED Final   Candida glabrata NOT DETECTED NOT DETECTED Final   Candida krusei NOT DETECTED NOT DETECTED Final   Candida parapsilosis NOT DETECTED NOT DETECTED Final   Candida tropicalis NOT DETECTED NOT DETECTED Final   Cryptococcus neoformans/gattii NOT DETECTED NOT DETECTED Final   CTX-M ESBL NOT DETECTED NOT DETECTED Final   Carbapenem resistance IMP NOT DETECTED NOT DETECTED Final   Carbapenem resistance KPC NOT DETECTED NOT DETECTED Final   Carbapenem resistance NDM NOT DETECTED NOT DETECTED Final   Carbapenem resist OXA 48 LIKE NOT DETECTED NOT DETECTED Final   Carbapenem resistance VIM NOT DETECTED NOT DETECTED Final    Comment: Performed at [facility], [address], [place], [place] [address]  Resp panel by RT-PCR (RSV, Flu A&B, Covid) Anterior Nasal Swab     Status: None   Collection Time: 09/19/23  9:00 AM   Specimen: Anterior Nasal Swab  Result Value Ref  Range Status   SARS Coronavirus 2 by RT PCR NEGATIVE NEGATIVE Final    Comment: (NOTE) SARS-CoV-2 target nucleic acids are NOT DETECTED.  The SARS-CoV-2 RNA is generally detectable in upper respiratory specimens during the acute phase of infection. The lowest concentration of SARS-CoV-2 viral copies this assay can detect is 138 copies/mL. A negative result does not preclude SARS-Cov-2 infection and should not be used as the sole basis for treatment or other patient management decisions. A negative result may occur with  improper specimen collection/handling, submission of specimen other than nasopharyngeal swab, presence of viral mutation(s) within the areas targeted by this assay, and inadequate number of viral copies(<138 copies/mL). A negative result must be combined with clinical observations, patient history, and epidemiological information. The expected result is Negative.  Fact Sheet for Patients:  BloggerCourse.com  Fact Sheet for Healthcare Providers:  SeriousBroker.it  This test is no t yet approved or cleared by the [place]  FDA and  has been authorized for detection and/or diagnosis of SARS-CoV-2 by FDA under an Emergency Use Authorization (EUA). This EUA will remain  in effect (meaning this test can be used) for the duration of the COVID-19 declaration under Section 564(b)(1) of the Act, 21 U.S.C.section 360bbb-3(b)(1), unless the authorization is terminated  or revoked sooner.       Influenza A by PCR NEGATIVE NEGATIVE Final   Influenza B by PCR NEGATIVE NEGATIVE Final    Comment: (NOTE) The Xpert Xpress SARS-CoV-2/FLU/RSV plus assay is intended as an aid in the diagnosis of influenza from Nasopharyngeal swab specimens and should not be used as a sole basis for treatment. Nasal washings and aspirates are unacceptable for Xpert Xpress SARS-CoV-2/FLU/RSV testing.  Fact Sheet for  Patients: BloggerCourse.com  Fact Sheet for Healthcare Providers: SeriousBroker.it  This test is not yet  approved or cleared by the [place]  FDA and has been authorized for detection and/or diagnosis of SARS-CoV-2 by FDA under an Emergency Use Authorization (EUA). This EUA will remain in effect (meaning this test can be used) for the duration of the COVID-19 declaration under Section 564(b)(1) of the Act, 21 U.S.C. section 360bbb-3(b)(1), unless the authorization is terminated or revoked.     Resp Syncytial Virus by PCR NEGATIVE NEGATIVE Final    Comment: (NOTE) Fact Sheet for Patients: BloggerCourse.com  Fact Sheet for Healthcare Providers: SeriousBroker.it  This test is not yet approved or cleared by the [place]  FDA and has been authorized for detection and/or diagnosis of SARS-CoV-2 by FDA under an Emergency Use Authorization (EUA). This EUA will remain in effect (meaning this test can be used) for the duration of the COVID-19 declaration under Section 564(b)(1) of the Act, 21 U.S.C. section 360bbb-3(b)(1), unless the authorization is terminated or revoked.  Performed at [facility], [address], [place], [place] [address]      Radiology Studies: No results found.    Nilda Fendt, MD, PhD [facility]  Between 7 am - 7 pm I am available, please contact me via Amion (for emergencies) or Securechat (non urgent messages)  Between 7 pm - 7 am I am not available, please contact night coverage MD/APP via Amion

## 2023-09-23 NOTE — TOC Transition Note (Signed)
 Transition of Care [facility]) - Discharge Note   Patient Details  Name: Brandon Flores MRN: [id number] Date of Birth: [birth year]  [facility] CM/SW Contact:  Andrez JULIANNA George, RN Phone Number: 09/23/2023, 12:09 PM   Clinical Narrative:     Pt is discharging to CIR today. CM signing off.   Final next level of care: IP Rehab Facility Barriers to Discharge: No Barriers Identified   Patient Goals and CMS Choice   CMS Medicare.gov Compare Post Acute Care list provided to:: Patient Choice offered to / list presented to : Patient      Discharge Placement                       Discharge Plan and Services Additional resources added to the After Visit Summary for     Discharge Planning Services: CM Consult                                 Social Drivers of Health (SDOH) Interventions SDOH Screenings   Food Insecurity: No Food Insecurity (09/19/2023)  Housing: Low Risk  (09/19/2023)  Transportation Needs: No Transportation Needs (09/19/2023)  Utilities: Not At Risk (09/19/2023)  Social Connections: Socially Integrated (09/19/2023)  Tobacco Use: Medium Risk (09/19/2023)     Readmission Risk Interventions     No data to display

## 2023-09-23 NOTE — H&P (Signed)
 Physical Medicine and Rehabilitation Admission H&P        Chief Complaint  Patient presents with   Functional deficits due to debility.       HPI:  Brandon Flores is a 78 year old male with history of left temporal hemorrhage with encephalomalacia, petit mal seizure disorder, BPPV, VB insuffiencey, gait disorder, prostate CA s/p XRT, frequency w/urge incontinence who was admitted on 09/19/23 with dizziness and fall with inability to get up. He reported significant weakness and slurred speech during this episode and presented to ED where he was found to be febrile with T-102, reported abominal pain and evidence of UTI. He was started on ceftriaxone  and blood cultures done positive for E coli and Enterobacterials w/sensitivities  pending. Leucocytosis with low grade fevers and thrombocytopenia have resolved. Stress induced hyperglycemia noted with Hgb A1c- 5.0. MRI brain negative for acute changes and showed sequela of remote hemorrhage anterior left temporal lobe.    CTA head heck was negative for LVO and showed chronic R-VA occlusion or poor flow, moderate stenosis proximal BA, high grade stenosis R-ICA 65-70%, severe stenosis L-ACA origin, 50% stenosis L-CCA and L-ICA bulb and up to moderate stenosis distal left PCA, R-ACA A2 and L-MCA M1 segment. CT abdomen  pelvis done revealing bladder wall thickening with pericystic edema suspicious for cystitis and hyperattenuation in dependent bladder favored to be early contrast secretion rather than new bladder stone. 2D echo showed EF 60-65% with mild LVH. ST evaluation done revealing intact language and SLUMS 18/30 but wife and patient declined tx. Therapy evaluations completed revealing balance deficits with increase in vertiginous symptoms and requires min assist for mobility and CGA for ADLs. He was independent PTA and CIR recommended due to functional decline.        Review of Systems  Constitutional:  Negative for chills and fever.  HENT:  Negative  for hearing loss.   Eyes:  Negative for blurred vision, double vision and photophobia.  Respiratory:  Negative for shortness of breath.   Cardiovascular:  Negative for chest pain and leg swelling.  Gastrointestinal:  Positive for constipation. Negative for abdominal pain, nausea and vomiting.  Genitourinary:  Positive for frequency.       Gets up about 3 times at nights.   Musculoskeletal:  Positive for joint pain (chronic left shoulder pain) and myalgias.  Neurological:  Positive for dizziness, sensory change (numbness bilateral plantar surface.) and weakness. Negative for headaches (used to have temporal migranes that have resolved X 30 years ago).  Psychiatric/Behavioral:  Negative for depression and memory loss. The patient does not have insomnia.           Past Medical History:  Diagnosis Date   Cervical radiculopathy     Colon polyp      (2006- adenoma and tubulovillous adenoma; 2010-adenomas; repeat 201)-Dr. Vicci   Complex partial seizure disorder (HCC) 10/08/2014   GERD (gastroesophageal reflux disease)     Gout     Hypercholesterolemia     Lateral epicondylitis      2002   Migraine      headaches   Ophthalmalgia      Dr. Roz   Orthodontics      Dr. Hughie   Partial epilepsy with impairment of consciousness (HCC)     Plantar fasciitis     Prostate cancer [facility])     Seizure disorder (HCC)      Dr. Cleotilde ([facility])   Seizures [facility])     Traumatic incomplete tear  of left rotator cuff 2024   UTI (urinary tract infection)     Vertebrobasilar insufficiency     Vertigo               Past Surgical History:  Procedure Laterality Date   BACK SURGERY       HEMORROIDECTOMY       NECK SURGERY       PROSTATE BIOPSY                   Family History  Problem Relation Age of Onset   Alcohol abuse Father     COPD Sister     Lung cancer Sister     Cancer Sister     Lung cancer Brother     Cancer Brother     Cancer Sister          lung   Lung cancer Sister      Lung cancer Brother     Cancer Brother     Seizures Neg Hx     Colon cancer Neg Hx     Esophageal cancer Neg Hx     Rectal cancer Neg Hx     Stomach cancer Neg Hx     Breast cancer Neg Hx     Prostate cancer Neg Hx     Pancreatic cancer Neg Hx            Social History:  Married.  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 He reports that he quit smoking about 37 years ago. His smoking use included cigarettes. He started smoking about 57 years ago. He has a 60 pack-year smoking history. He has never used smokeless tobacco. He reports that he does not drink alcohol and does not use drugs.     Allergies:  Allergies  No Known Allergies             Medications Prior to Admission  Medication Sig Dispense Refill   allopurinol  (ZYLOPRIM ) 100 MG tablet Take 1 tablet (100 mg total) by mouth daily. 90 tablet 3   carbamazepine  (TEGRETOL ) 200 MG tablet Take 2 tablets (400 mg total) by mouth 3 (three) times daily. WILL TRY SWITCH TO GENERIC 540 tablet 4   dutasteride  (AVODART ) 0.5 MG capsule Take 1 capsule (0.5 mg total) by mouth every evening. 90 capsule 3   levothyroxine  (SYNTHROID ) 112 MCG tablet Take 112 mcg by mouth daily.       meclizine  (ANTIVERT ) 25 MG tablet Take 1 tablet (25 mg total) by mouth 3 (three) times daily as needed for dizziness. 30 tablet 6   meloxicam (MOBIC) 15 MG tablet Take 15 mg by mouth daily.       oxyCODONE -acetaminophen  (PERCOCET/ROXICET) 5-325 MG tablet Take 1 tablet by mouth every 8 (eight) hours as needed for severe pain. 15 tablet 0   tamsulosin  (FLOMAX ) 0.4 MG CAPS capsule Take 2 capsules (0.8 mg total) by mouth daily after supper. 180 capsule 3              Home: Home Living Family/patient expects to be discharged to:: Private residence Living Arrangements: Spouse/significant other Available Help at Discharge: Family, Available 24 hours/day Type of Home: House Home Access: Stairs to  enter [employer] of Steps: 3+1 from outside to inside, 3 to get to den area [employer]: Can reach both Home Layout: Two level, Bed/bath upstairs Alternate Level Stairs-Number of Steps: 14 Alternate Level Stairs-Rails: Right Bathroom Shower/Tub: Tub/shower  unit Bathroom Toilet: Standard Home Equipment: Information systems manager, Medical laboratory scientific officer - single point, Rollator (4 wheels), Other (comment), Cane - quad  Lives With: Spouse   Functional History: Prior Function Prior Level of Function : Independent/Modified Independent, Driving Mobility Comments: furniture walked in home, [facility] in community. ADLs Comments: ind   Functional Status:  Mobility: Bed Mobility Overal bed mobility: Needs Assistance Bed Mobility: Supine to Sit, Sit to Supine Supine to sit: Contact guard, HOB elevated Sit to supine: Contact guard assist Transfers Overall transfer level: Needs assistance Equipment used: Rolling walker (2 wheels) Transfers: Sit to/from Stand Sit to Stand: Min assist General transfer comment: posterior lean when standing Ambulation/Gait Ambulation/Gait assistance: Min assist Gait Distance (Feet): 25 Feet Assistive device: Rolling walker (2 wheels) Gait Pattern/deviations: Step-to pattern, Trunk flexed, Ataxic General Gait Details: pt requiring cues to maintain RW close to BOS and to maintain body within walker frame when turning, lateral instability noted, pt often flexed over RW with downward gaze to the floor Gait velocity: reduced Gait velocity interpretation: <1.31 ft/sec, indicative of household ambulator Stairs: Yes Stairs assistance: Contact guard assist Stair Management: Two rails, Alternating pattern, Forwards Number of Stairs: 2 General stair comments: Part of DGI.   ADL: ADL Overall ADL's : Needs assistance/impaired Eating/Feeding: Independent, Sitting Grooming: Standing, Contact guard assist Upper Body Bathing: Standing, Contact guard assist Lower Body Bathing:  Sitting/lateral leans, Contact guard assist Upper Body Dressing : Sitting, Set up Lower Body Dressing: Sitting/lateral leans, Contact guard assist Lower Body Dressing Details (indicate cue type and reason): doff/donned socks. Spouse reports he sometimes gets dizzy with that but no complaints with task today. Toilet Transfer: Contact guard assist, Ambulation Toilet Transfer Details (indicate cue type and reason): CGA pt with mild sway of balance when going to sit but caught  himself Toileting- Architect and Hygiene: Contact guard assist, Sit to/from stand Functional mobility during ADLs: Contact guard assist   Cognition: Cognition Overall Cognitive Status: Impaired/Different from baseline Arousal/Alertness: Awake/alert Orientation Level: Oriented X4 Year: 2025 Day of Week: Correct Attention: Sustained Sustained Attention: Appears intact Memory: Impaired Memory Impairment: Storage deficit, Retrieval deficit (2/5 words) Awareness: Impaired Awareness Impairment:  (suspect intellectual) Problem Solving: Impaired Problem Solving Impairment: Functional basic, Verbal basic Safety/Judgment:  (questionable-needs supervision with meds) Cognition Arousal: Alert Behavior During Therapy: WFL for tasks assessed/performed Overall Cognitive Status: Impaired/Different from baseline     Blood pressure 130/81, pulse 68, temperature 98.3 F (36.8 C), temperature source Oral, resp. rate 20, SpO2 99%. Physical Exam Vitals and nursing note reviewed.  Constitutional:      Appearance: He is obese.  HENT:     Head: Normocephalic and atraumatic.     Right Ear: External ear normal.     Left Ear: External ear normal.     Mouth/Throat:     Mouth: Mucous membranes are moist.    Eyes:     Extraocular Movements: Extraocular movements intact.     Conjunctiva/sclera: Conjunctivae normal.     Pupils: Pupils are equal, round, and reactive to light.      Cardiovascular:     Rate and Rhythm:  Normal rate.     Heart sounds: No murmur heard.    No gallop.  Pulmonary:     Effort: Pulmonary effort is normal. No respiratory distress.     Breath sounds: Wheezing present.  Abdominal:     General: There is distension.     Tenderness: There is no abdominal tenderness.    Musculoskeletal:     Cervical back:  Normal range of motion.    Skin:    General: Skin is warm and dry.    Neurological:     Mental Status: He is alert and oriented to person, place, and time.     Comments: Alert and oriented x 3. Normal insight and awareness. Intact Memory. Normal language and speech. Cranial nerve exam unremarkable. MMT: BUE 4+/5 SA, EF, EE, WF, WE and HI. BLE 4/5 HF, 4+ KE, 4+ ADF/PF. Sensory exam notable for stocking glove sensory loss in the feet. No limb ataxia or cerebellar signs. No abnormal tone appreciated.  I did not see nystagmus during positional testing but was limited by his position in bed.    Psychiatric:        Mood and Affect: Mood normal.        Behavior: Behavior normal.        Lab Results Last 48 Hours        Results for orders placed or performed during the hospital encounter of 09/19/23 (from the past 48 hours)  CBC     Status: Abnormal    Collection Time: 09/23/23  4:50 AM  Result Value Ref Range    WBC 4.7 4.0 - 10.5 K/uL    RBC 3.93 (L) 4.22 - 5.81 MIL/uL    Hemoglobin 12.2 (L) 13.0 - 17.0 g/dL    HCT 63.3 (L) 60.9 - 52.0 %    MCV 93.1 80.0 - 100.0 fL    MCH 31.0 26.0 - 34.0 pg    MCHC 33.3 30.0 - 36.0 g/dL    RDW 86.3 88.4 - 84.4 %    Platelets 166 150 - 400 K/uL    nRBC 0.0 0.0 - 0.2 %      Comment: Performed at [facility], [address], [place], [place] [address]  Basic metabolic panel with GFR     Status: Abnormal    Collection Time: 09/23/23  4:50 AM  Result Value Ref Range    Sodium 141 135 - 145 mmol/L    Potassium 3.7 3.5 - 5.1 mmol/L    Chloride 110 98 - 111 mmol/L    CO2 22 22 - 32 mmol/L    Glucose, Bld 163 (H) 70 - 99 mg/dL       Comment: Glucose reference range applies only to samples taken after fasting for at least 8 hours.    BUN 20 8 - 23 mg/dL    Creatinine, Ser 8.71 (H) 0.61 - 1.24 mg/dL    Calcium 8.1 (L) 8.9 - 10.3 mg/dL    GFR, Estimated 58 (L) >60 mL/min      Comment: (NOTE) Calculated using the CKD-EPI Creatinine Equation (2021)      Anion gap 9 5 - 15      Comment: Performed at [facility], [address], [place], [place] [address]  Magnesium      Status: None    Collection Time: 09/23/23  4:50 AM  Result Value Ref Range    Magnesium  2.0 1.7 - 2.4 mg/dL      Comment: Performed at [facility], [address] [place], [place] [address]      Imaging Results (Last 48 hours)  No results found.         Blood pressure 130/81, pulse 68, temperature 98.3 F (36.8 C), temperature source Oral, resp. rate 20, SpO2 99%.   Medical Problem List and Plan: 1. Functional deficits secondary to debility after urosepsis with exacerbation of baseline vertebral-basilar  insufficiency   -hx of left temporal hemorrhage             -patient may shower             -ELOS/Goals: 7-10 days, mod I goals 2.  Antithrombotics: -DVT/anticoagulation:  Pharmaceutical: Lovenox             -antiplatelet therapy: Was not on ASA PTA--will decrease ASA to 81 mg due to hx of temporal bleed.  3. Pain Management: Tylenol  prn.  4. Mood/Behavior/Sleep: LCSW to follow for evaluation and support.              -antipsychotic agents: N/A 5. Neuropsych/cognition: This patient is capable of making decisions on his own behalf. 6. Skin/Wound Care: Routine pressure relief measures.  7. Fluids/Electrolytes/Nutrition: Monitor I/O. Check CMET in am. 10.  Sepsis secondary to E. Coli bacteremia -pt received 5 days of ceftriaxone  -begin augmentin  for 5 additional days thru 6/28  --Cystitis likely due to UTI,  11.  Wheezing: Does have elevated right diaphragm with low volumes. Hx of 3 PPD smoking in the past --Will monitor  respiratory status with increase in activity.              --Encourage pulmonary hygiene. Will add incentive spirometry.  12.  H/o VB insufficency w/Left temporal encephalomalacia: Acute on chronic vertigo.  --Continue vestibular tx. Meclizine  prn as at home.  15.  Gait disorder w/falls: Likely multifactorial. Will also order orthostatic vitals to rule out as cause.  --Vestibular Tx, awareness of deficits as well as safety.  11.  Acute renal failure: SCr up to 1.3 since last check 03/13 likely due to cystitis, mobic and fall.  --Will monitor voiding to rule out retention as cause.  12.  H/o of partial complex seizures: On Tegretol  400 mg TID per  06/2023 visit w/Dr. Gregg. Random level elevated (after am dose?)             --seizures consist of uncontrollable movement of hands mainly rubbing abdomen.              --will check tegretol  level in am  13. H/o of prostate cancer: Has urge incontinence/frequency. Continue dutasteride  and tamsulosin .              --toilet patient every 2-3 hours. Monitor voiding with PVR checks.  14. Hypomagnesemia: Was 1.6 @ admission and improved 2.0.  --will recheck level in am.  15. Advanced Cervical DDD/ Lumbar spondylolisthesis:  Recent Rx- percocet 5 on 06/16 #30 per Dr. Hughie.              --Was also on Mobic and will not resume at this time.  16. Constipation: Will add daily miralax . Sorbitol today if no results.          Sharlet GORMAN Schmitz, PA-C 09/23/2023

## 2023-09-23 NOTE — Care Management Important Message (Signed)
 Important Message  Patient Details  Name: Brandon Flores MRN: [id number] Date of Birth: February 25, 1946   Important Message Given:  Yes - Medicare IM     Jon Cruel 09/23/2023, 2:43 PM

## 2023-09-23 NOTE — Progress Notes (Signed)
 PMR Admission Coordinator Pre-Admission Assessment   Patient: Brandon Flores is an 78 y.o., male MRN: [id number] DOB: [birth year] Height:   Weight:     Insurance Information HMO:     PPO:      PCP:      IPA:      80/20:      OTHER:  PRIMARY: Medicare A & B      Policy#: [id number]      Subscriber: patient   Eff. Date: 07/02/2011     Deduct: $1676.00       CIR: 100%      SNF: 20 full days Outpatient: 80%      Home Health: 100%       DME: 80%      SECONDARY: [employer]      Policy#: [id number]     Phone#: [phone]       The "Data Collection Information Summary" for patients in Inpatient Rehabilitation Facilities with attached "Privacy Act Statement-Health Care Records" was provided and verbally reviewed with: Patient and Family   Emergency Contact Information Contact Information       Name Relation Home Work Mobile    Ellsworth R Spouse     [phone]         Other Contacts   None on File        Current Medical History  Patient Admitting Diagnosis: Sepsis History of Present Illness: 78 year old male admitted to [facility] hospital 09/19/2023 with dizziness and slurred speech. When he presented to the ED he was found to be febrile to 102, tachycardic, tachypneic. He was satting well on room air. Urinalysis had some evidence of infection. UTI-on ceftriaxone , intermittent he experiences upper airway wheezing, this has been happening at home for the past year. Will need PFTs as an outpatient. Continue supplemental oxygen, wean off to room air as tolerated. 2D echocardiogram done 6/20 shows LVEF 60-65%, no WMA, grade 1 diastolic dysfunction, normal RV.  MRI on admission was negative for stroke, did show sequela of remote hemorrhage in the anterior left temporal lobe. Vertigo- MRI on admission was negative for stroke, did show sequela of remote hemorrhage in the anterior left temporal lobe. PMH HLD, hypothyroidism, prostate cancer, BPH, seizures,vertebrobasilar insufficiency syndrome,  encephalomalacia. Therapies recommend intensive rehab program at this time.    Complete NIHSS TOTAL: 0   Patient's medical record from [facility] has been reviewed by the rehabilitation admission coordinator and physician.   Past Medical History      Past Medical History:  Diagnosis Date   Cervical radiculopathy     Colon polyp      (2006- adenoma and tubulovillous adenoma; 2010-adenomas; repeat 201)-Dr. Vicci   Complex partial seizure disorder (HCC) 10/08/2014   GERD (gastroesophageal reflux disease)     Gout     Hypercholesterolemia     Lateral epicondylitis      2002   Migraine      headaches   Ophthalmalgia      Dr. Roz   Orthodontics      Dr. Hughie   Partial epilepsy with impairment of consciousness (HCC)     Plantar fasciitis     Prostate cancer (HCC)     Seizure disorder (HCC)      Dr. Cleotilde ([facility])   Seizures [facility])     UTI (urinary tract infection)     Vertebrobasilar insufficiency     Vertigo            Has the patient had major surgery  during 100 days prior to admission? No   Family History   family history includes Alcohol abuse in his father; COPD in his sister; Cancer in his brother, brother, sister, and sister; Lung cancer in his brother, brother, sister, and sister.   Current Medications  Current Medications    Current Facility-Administered Medications:    acetaminophen  (TYLENOL ) tablet 650 mg, 650 mg, Oral, Q6H PRN, 650 mg at 09/22/23 1031 **OR** acetaminophen  (TYLENOL ) suppository 650 mg, 650 mg, Rectal, Q6H PRN, Melvin, Alexander B, MD   allopurinol  (ZYLOPRIM ) tablet 100 mg, 100 mg, Oral, Daily, Melvin, Alexander B, MD, 100 mg at 09/23/23 9055   aspirin  suppository 300 mg, 300 mg, Rectal, Daily **OR** aspirin  tablet 325 mg, 325 mg, Oral, Daily, Melvin, Alexander B, MD, 325 mg at 09/23/23 9055   carbamazepine  (TEGRETOL ) tablet 400 mg, 400 mg, Oral, TID, Melvin, Alexander B, MD, 400 mg at 09/23/23 9055   cefTRIAXone  (ROCEPHIN ) 2 g in sodium  chloride 0.9 % 100 mL IVPB, 2 g, Intravenous, Q24H, Melvin, Alexander B, MD, Last Rate: 200 mL/hr at 09/22/23 1311, 2 g at 09/22/23 1311   dutasteride  (AVODART ) capsule 0.5 mg, 0.5 mg, Oral, QPM, Melvin, Alexander B, MD, 0.5 mg at 09/22/23 1723   ipratropium-albuterol  (DUONEB) 0.5-2.5 (3) MG/3ML nebulizer solution 3 mL, 3 mL, Nebulization, Q6H PRN, Sundil, Subrina, MD, 3 mL at 09/20/23 0758   levothyroxine  (SYNTHROID ) tablet 112 mcg, 112 mcg, Oral, Q0600, Melvin, Alexander B, MD, 112 mcg at 09/23/23 9444   meclizine  (ANTIVERT ) tablet 25 mg, 25 mg, Oral, TID PRN, Melvin, Alexander B, MD   Oral care mouth rinse, 15 mL, Mouth Rinse, PRN, Melvin, Alexander B, MD   polyethylene glycol (MIRALAX  / GLYCOLAX ) packet 17 g, 17 g, Oral, Daily PRN, Melvin, Alexander B, MD, 17 g at 09/23/23 0944   sodium chloride  flush (NS) 0.9 % injection 3 mL, 3 mL, Intravenous, Q12H, Melvin, Alexander B, MD, 3 mL at 09/23/23 9053   tamsulosin  (FLOMAX ) capsule 0.8 mg, 0.8 mg, Oral, QPC supper, Melvin, Alexander B, MD, 0.8 mg at 09/22/23 1723     Patients Current Diet:  Diet Order                  Diet regular Room service appropriate? Yes; Fluid consistency: Thin  Diet effective now                         Precautions / Restrictions Precautions Precautions: Fall Restrictions Weight Bearing Restrictions Per Provider Order: No    Has the patient had 2 or more falls or a fall with injury in the past year? No   Prior Activity Level   Prior Functional Level Self Care: Did the patient need help bathing, dressing, using the toilet or eating? Independent   Indoor Mobility: Did the patient need assistance with walking from room to room (with or without device)? Independent   Stairs: Did the patient need assistance with internal or external stairs (with or without device)? Independent   Functional Cognition: Did the patient need help planning regular tasks such as shopping or remembering to take medications?  Independent   Patient Information Are you of Hispanic, Latino/a,or Spanish origin?: A. No, not of Hispanic, Latino/a, or Spanish origin What is your race?: A. White Do you need or want an interpreter to communicate with a doctor or health care staff?: 0. No   Patient's Response To:  Health Literacy and Transportation Is the patient able to respond to  health literacy and transportation needs?: Yes Health Literacy - How often do you need to have someone help you when you read instructions, pamphlets, or other written material from your doctor or pharmacy?: Rarely In the past 12 months, has lack of transportation kept you from medical appointments or from getting medications?: No In the past 12 months, has lack of transportation kept you from meetings, work, or from getting things needed for daily living?: No   Journalist, newspaper / Equipment Home Equipment: Information systems manager, Medical laboratory scientific officer - single point, Rollator (4 wheels), Other (comment), Cane - quad   Prior Device Use: Indicate devices/aids used by the patient prior to current illness, exacerbation or injury? None of the above   Current Functional Level Cognition   Arousal/Alertness: Awake/alert Overall Cognitive Status: Impaired/Different from baseline Orientation Level: Oriented X4 Attention: Sustained Sustained Attention: Appears intact Memory: Impaired Memory Impairment: Storage deficit, Retrieval deficit (2/5 words) Awareness: Impaired Awareness Impairment:  (suspect intellectual) Problem Solving: Impaired Problem Solving Impairment: Functional basic, Verbal basic Safety/Judgment:  (questionable-needs supervision with meds)    Extremity Assessment (includes Sensation/Coordination)   Upper Extremity Assessment: Overall WFL for tasks assessed  Lower Extremity Assessment: Overall WFL for tasks assessed     ADLs   Overall ADL's : Needs assistance/impaired Eating/Feeding: Independent, Sitting Grooming: Standing, Contact guard  assist Upper Body Bathing: Standing, Contact guard assist Lower Body Bathing: Sitting/lateral leans, Contact guard assist Upper Body Dressing : Sitting, Set up Lower Body Dressing: Sitting/lateral leans, Contact guard assist Lower Body Dressing Details (indicate cue type and reason): doff/donned socks. Spouse reports he sometimes gets dizzy with that but no complaints with task today. Toilet Transfer: Contact guard assist, Ambulation Toilet Transfer Details (indicate cue type and reason): CGA pt with mild sway of balance when going to sit but caught  himself Toileting- Architect and Hygiene: Contact guard assist, Sit to/from stand Functional mobility during ADLs: Contact guard assist     Mobility   Overal bed mobility: Needs Assistance Bed Mobility: Supine to Sit, Sit to Supine Supine to sit: Contact guard, HOB elevated Sit to supine: Contact guard assist     Transfers   Overall transfer level: Needs assistance Equipment used: Rolling walker (2 wheels) Transfers: Sit to/from Stand Sit to Stand: Min assist General transfer comment: posterior lean when standing     Ambulation / Gait / Stairs / Wheelchair Mobility   Ambulation/Gait Ambulation/Gait assistance: Editor, commissioning (Feet): 25 Feet Assistive device: Rolling walker (2 wheels) Gait Pattern/deviations: Step-to pattern, Trunk flexed, Ataxic General Gait Details: pt requiring cues to maintain RW close to BOS and to maintain body within walker frame when turning, lateral instability noted, pt often flexed over RW with downward gaze to the floor Gait velocity: reduced Gait velocity interpretation: <1.31 ft/sec, indicative of household ambulator Stairs: Yes Stairs assistance: Contact guard assist Stair Management: Two rails, Alternating pattern, Forwards Number of Stairs: 2 General stair comments: Part of DGI.     Posture / Balance Dynamic Sitting Balance Sitting balance - Comments: intermittent posterior  lean requiring CGA-minA to correct initially, improves throughout session to stand by assist Balance Overall balance assessment: Needs assistance Sitting-balance support: Single extremity supported, Feet supported Sitting balance-Leahy Scale: Poor Sitting balance - Comments: intermittent posterior lean requiring CGA-minA to correct initially, improves throughout session to stand by assist Standing balance support: Bilateral upper extremity supported, Reliant on assistive device for balance Standing balance-Leahy Scale: Poor Standardized Balance Assessment Standardized Balance Assessment : Dynamic Gait Index Dynamic Gait Index  Level Surface: Mild Impairment Change in Gait Speed: Moderate Impairment Gait with Horizontal Head Turns: Mild Impairment Gait with Vertical Head Turns: Mild Impairment Gait and Pivot Turn: Moderate Impairment Step Over Obstacle: Moderate Impairment Step Around Obstacles: Severe Impairment Steps: Mild Impairment Total Score: 11     Special needs/care consideration Oxygen currently using 2 liters, not on O2 at home and Skin intact    Previous Home Environment (from acute therapy documentation) Living Arrangements: Spouse/significant other  Lives With: Spouse Available Help at Discharge: Family, Available 24 hours/day Type of Home: House Home Layout: Two level, Bed/bath upstairs Alternate Level Stairs-Rails: Right Alternate Level Stairs-Number of Steps: 14 Home Access: Stairs to enter Entrance Stairs-Rails: Can reach both Entrance Stairs-Number of Steps: 3+1 from outside to inside, 3 to get to [employer] area [employer] Shower/Tub: Engineer, manufacturing systems: Standard   Discharge Living Setting Plans for Discharge Living Setting: Patient's home, Lives with (comment) (spouse) Type of Home at Discharge: House Discharge Home Layout: Two level, Bed/bath upstairs Alternate Level Stairs-Rails: Right Alternate Level Stairs-Number of Steps: 14 Discharge Home Access:  Stairs to enter Entrance Stairs-Rails: Can reach both Entrance Stairs-Number of Steps: 4 Discharge Bathroom Shower/Tub: Tub/shower unit Discharge Bathroom Toilet: Standard Discharge Bathroom Accessibility: Yes How Accessible: Accessible via walker Does the patient have any problems obtaining your medications?: No   Social/Family/Support Systems Anticipated Caregiver: Alfreda Cones Anticipated Caregiver's Contact Information: [phone] Ability/Limitations of Caregiver: supervision only Caregiver Availability: 24/7 Discharge Plan Discussed with Primary Caregiver: Yes Is Caregiver In Agreement with Plan?: Yes Does Caregiver/Family have Issues with Lodging/Transportation while Pt is in Rehab?: No   Goals Patient/Family Goal for Rehab: Mod I PT, OT, independent with SLP Expected length of stay: 7-10 days Pt/Family Agrees to Admission and willing to participate: Yes Program Orientation Provided & Reviewed with Pt/Caregiver Including Roles  & Responsibilities: Yes  Barriers to Discharge: Insurance for SNF coverage   Decrease burden of Care through IP rehab admission: Othern/a   Possible need for SNF placement upon discharge: not anticipated   Patient Condition: I have reviewed medical records from [facility], spoken with TOC, and patient and spouse. I met with patient at the bedside for inpatient rehabilitation assessment.  Patient will benefit from ongoing PT, OT, and SLP, can actively participate in 3 hours of therapy a day 5 days of the week, and can make measurable gains during the admission.  Patient will also benefit from the coordinated team approach during an Inpatient Acute Rehabilitation admission.  The patient will receive intensive therapy as well as Rehabilitation physician, nursing, social worker, and care management interventions.  Due to safety, disease management, medication administration, pain management, and patient education the patient requires 24 hour a day  rehabilitation nursing.  The patient is currently min A with mobility and basic ADLs.  Discharge setting and therapy post discharge at home with home health is anticipated.  Patient has agreed to participate in the Acute Inpatient Rehabilitation Program and will admit today.   Preadmission Screen Completed By:  COSMO VEAR PLATTER, 09/23/2023 10:52 AM ______________________________________________________________________   Discussed status with Dr. Babs on 09/23/23 at 9:00 am and received approval for admission today.   Admission Coordinator:  COSMO VEAR PLATTER, time 12: 50pm /Date 09/23/23    Assessment/Plan: Diagnosis: debility after sepsis Does the need for close, 24 hr/day Medical supervision in concert with the patient's rehab needs make it unreasonable for this patient to be served in a less intensive setting? Yes Co-Morbidities requiring supervision/potential complications: vertigo/VBI, cervical  radiculopathy, prostate cancer, seizures Due to bladder management, bowel management, safety, skin/wound care, disease management, medication administration, pain management, and patient education, does the patient require 24 hr/day rehab nursing? Yes Does the patient require coordinated care of a physician, rehab nurse, PT, OT, and SLP to address physical and functional deficits in the context of the above medical diagnosis(es)? Yes Addressing deficits in the following areas: balance, endurance, locomotion, strength, transferring, bowel/bladder control, bathing, dressing, feeding, grooming, toileting, cognition, and psychosocial support Can the patient actively participate in an intensive therapy program of at least 3 hrs of therapy 5 days a week? Yes The potential for patient to make measurable gains while on inpatient rehab is excellent Anticipated functional outcomes upon discharge from inpatient rehab: modified independent PT, modified independent OT, independent SLP Estimated rehab length of stay  to reach the above functional goals is: 7-10 days Anticipated discharge destination: Home 10. Overall Rehab/Functional Prognosis: excellent     MD Signature: Arthea IVAR Gunther, MD, [facility] Physical Medicine & Rehabilitation Medical Director Rehabilitation Services 09/23/2023 //

## 2023-09-23 NOTE — H&P (Signed)
 Physical Medicine and Rehabilitation Admission H&P    Chief Complaint  Patient presents with   Functional deficits due to debility.     HPI:  Brandon Flores is a 78 year old male with history of left temporal hemorrhage with encephalomalacia, petit mal seizure disorder, BPPV, VB insuffiencey, gait disorder, prostate CA s/p XRT, frequency w/urge incontinence who was admitted on 09/19/23 with dizziness and fall with inability to get up. He reported significant weakness and slurred speech during this episode and presented to ED where he was found to be febrile with T-102, reported abominal pain and evidence of UTI. He was started on ceftriaxone  and blood cultures done positive for E coli and Enterobacterials w/sensitivities  pending. Leucocytosis with low grade fevers and thrombocytopenia have resolved. Stress induced hyperglycemia noted with Hgb A1c- 5.0. MRI brain negative for acute changes and showed sequela of remote hemorrhage anterior left temporal lobe.   CTA head heck was negative for LVO and showed chronic R-VA occlusion or poor flow, moderate stenosis proximal BA, high grade stenosis R-ICA 65-70%, severe stenosis L-ACA origin, 50% stenosis L-CCA and L-ICA bulb and up to moderate stenosis distal left PCA, R-ACA A2 and L-MCA M1 segment. CT abdomen  pelvis done revealing bladder wall thickening with pericystic edema suspicious for cystitis and hyperattenuation in dependent bladder favored to be early contrast secretion rather than new bladder stone. 2D echo showed EF 60-65% with mild LVH. ST evaluation done revealing intact language and SLUMS 18/30 but wife and patient declined tx. Therapy evaluations completed revealing balance deficits with increase in vertiginous symptoms and requires min assist for mobility and CGA for ADLs. He was independent PTA and CIR recommended due to functional decline.     Review of Systems  Constitutional:  Negative for chills and fever.  HENT:  Negative for  hearing loss.   Eyes:  Negative for blurred vision, double vision and photophobia.  Respiratory:  Negative for shortness of breath.   Cardiovascular:  Negative for chest pain and leg swelling.  Gastrointestinal:  Positive for constipation. Negative for abdominal pain, nausea and vomiting.  Genitourinary:  Positive for frequency.       Gets up about 3 times at nights.   Musculoskeletal:  Positive for joint pain (chronic left shoulder pain) and myalgias.  Neurological:  Positive for dizziness, sensory change (numbness bilateral plantar surface.) and weakness. Negative for headaches (used to have temporal migranes that have resolved X 30 years ago).  Psychiatric/Behavioral:  Negative for depression and memory loss. The patient does not have insomnia.     Past Medical History:  Diagnosis Date   Cervical radiculopathy    Colon polyp    (2006- adenoma and tubulovillous adenoma; 2010-adenomas; repeat 201)-Dr. Vicci   Complex partial seizure disorder (HCC) 10/08/2014   GERD (gastroesophageal reflux disease)    Gout    Hypercholesterolemia    Lateral epicondylitis    2002   Migraine    headaches   Ophthalmalgia    Dr. Roz   Orthodontics    Dr. Hughie   Partial epilepsy with impairment of consciousness (HCC)    Plantar fasciitis    Prostate cancer [facility])    Seizure disorder (HCC)    Dr. Cleotilde ([facility])   Seizures [facility])    Traumatic incomplete tear of left rotator cuff 2024   UTI (urinary tract infection)    Vertebrobasilar insufficiency    Vertigo    Past Surgical History:  Procedure Laterality Date   BACK SURGERY  HEMORROIDECTOMY     NECK SURGERY     PROSTATE BIOPSY      Family History  Problem Relation Age of Onset   Alcohol abuse Father    COPD Sister    Lung cancer Sister    Cancer Sister    Lung cancer Brother    Cancer Brother    Cancer Sister        lung   Lung cancer Sister    Lung cancer Brother    Cancer Brother    Seizures Neg Hx    Colon cancer Neg  Hx    Esophageal cancer Neg Hx    Rectal cancer Neg Hx    Stomach cancer Neg Hx    Breast cancer Neg Hx    Prostate cancer Neg Hx    Pancreatic cancer Neg Hx     Social History:  Married.  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 He reports that he quit smoking about 37 years ago. His smoking use included cigarettes. He started smoking about 57 years ago. He has a 60 pack-year smoking history. He has never used smokeless tobacco. He reports that he does not drink alcohol and does not use drugs.   Allergies: No Known Allergies   Medications Prior to Admission  Medication Sig Dispense Refill   allopurinol  (ZYLOPRIM ) 100 MG tablet Take 1 tablet (100 mg total) by mouth daily. 90 tablet 3   carbamazepine  (TEGRETOL ) 200 MG tablet Take 2 tablets (400 mg total) by mouth 3 (three) times daily. WILL TRY SWITCH TO GENERIC 540 tablet 4   dutasteride  (AVODART ) 0.5 MG capsule Take 1 capsule (0.5 mg total) by mouth every evening. 90 capsule 3   levothyroxine  (SYNTHROID ) 112 MCG tablet Take 112 mcg by mouth daily.     meclizine  (ANTIVERT ) 25 MG tablet Take 1 tablet (25 mg total) by mouth 3 (three) times daily as needed for dizziness. 30 tablet 6   meloxicam (MOBIC) 15 MG tablet Take 15 mg by mouth daily.     oxyCODONE -acetaminophen  (PERCOCET/ROXICET) 5-325 MG tablet Take 1 tablet by mouth every 8 (eight) hours as needed for severe pain. 15 tablet 0   tamsulosin  (FLOMAX ) 0.4 MG CAPS capsule Take 2 capsules (0.8 mg total) by mouth daily after supper. 180 capsule 3      Home: Home Living Family/patient expects to be discharged to:: Private residence Living Arrangements: Spouse/significant other Available Help at Discharge: Family, Available 24 hours/day Type of Home: House Home Access: Stairs to enter [employer] of Steps: 3+1 from outside to inside, 3 to get to den area [employer]: Can reach both Home Layout: Two  level, Bed/bath upstairs Alternate Level Stairs-Number of Steps: 14 Alternate Level Stairs-Rails: Right Bathroom Shower/Tub: Engineer, manufacturing systems: Standard Home Equipment: Information systems manager, Medical laboratory scientific officer - single point, Rollator (4 wheels), Other (comment), Cane - quad  Lives With: Spouse   Functional History: Prior Function Prior Level of Function : Independent/Modified Independent, Driving Mobility Comments: furniture walked in home, [facility] in community. ADLs Comments: ind  Functional Status:  Mobility: Bed Mobility Overal bed mobility: Needs Assistance Bed Mobility: Supine to Sit, Sit to Supine Supine to sit: Contact guard, HOB elevated Sit to supine: Contact guard assist Transfers Overall transfer level: Needs assistance Equipment used: Rolling walker (2 wheels) Transfers: Sit to/from Stand Sit to Stand: Min assist General transfer comment: posterior lean when standing Ambulation/Gait Ambulation/Gait assistance: Min Chemical engineer (Feet): 25 Feet Assistive  device: Rolling walker (2 wheels) Gait Pattern/deviations: Step-to pattern, Trunk flexed, Ataxic General Gait Details: pt requiring cues to maintain RW close to BOS and to maintain body within walker frame when turning, lateral instability noted, pt often flexed over RW with downward gaze to the floor Gait velocity: reduced Gait velocity interpretation: <1.31 ft/sec, indicative of household ambulator Stairs: Yes Stairs assistance: Contact guard assist Stair Management: Two rails, Alternating pattern, Forwards Number of Stairs: 2 General stair comments: Part of DGI.    ADL: ADL Overall ADL's : Needs assistance/impaired Eating/Feeding: Independent, Sitting Grooming: Standing, Contact guard assist Upper Body Bathing: Standing, Contact guard assist Lower Body Bathing: Sitting/lateral leans, Contact guard assist Upper Body Dressing : Sitting, Set up Lower Body Dressing: Sitting/lateral leans, Contact guard  assist Lower Body Dressing Details (indicate cue type and reason): doff/donned socks. Spouse reports he sometimes gets dizzy with that but no complaints with task today. Toilet Transfer: Contact guard assist, Ambulation Toilet Transfer Details (indicate cue type and reason): CGA pt with mild sway of balance when going to sit but caught  himself Toileting- Architect and Hygiene: Contact guard assist, Sit to/from stand Functional mobility during ADLs: Contact guard assist  Cognition: Cognition Overall Cognitive Status: Impaired/Different from baseline Arousal/Alertness: Awake/alert Orientation Level: Oriented X4 Year: 2025 Day of Week: Correct Attention: Sustained Sustained Attention: Appears intact Memory: Impaired Memory Impairment: Storage deficit, Retrieval deficit (2/5 words) Awareness: Impaired Awareness Impairment:  (suspect intellectual) Problem Solving: Impaired Problem Solving Impairment: Functional basic, Verbal basic Safety/Judgment:  (questionable-needs supervision with meds) Cognition Arousal: Alert Behavior During Therapy: WFL for tasks assessed/performed Overall Cognitive Status: Impaired/Different from baseline   Blood pressure 130/81, pulse 68, temperature 98.3 F (36.8 C), temperature source Oral, resp. rate 20, SpO2 99%. Physical Exam Vitals and nursing note reviewed.  Constitutional:      Appearance: He is obese.  HENT:     Head: Normocephalic and atraumatic.     Right Ear: External ear normal.     Left Ear: External ear normal.     Mouth/Throat:     Mouth: Mucous membranes are moist.   Eyes:     Extraocular Movements: Extraocular movements intact.     Conjunctiva/sclera: Conjunctivae normal.     Pupils: Pupils are equal, round, and reactive to light.    Cardiovascular:     Rate and Rhythm: Normal rate.     Heart sounds: No murmur heard.    No gallop.  Pulmonary:     Effort: Pulmonary effort is normal. No respiratory distress.      Breath sounds: Wheezing present.  Abdominal:     General: There is distension.     Tenderness: There is no abdominal tenderness.   Musculoskeletal:     Cervical back: Normal range of motion.   Skin:    General: Skin is warm and dry.   Neurological:     Mental Status: He is alert and oriented to person, place, and time.     Comments: Alert and oriented x 3. Normal insight and awareness. Intact Memory. Normal language and speech. Cranial nerve exam unremarkable. MMT: BUE 4+/5 SA, EF, EE, WF, WE and HI. BLE 4/5 HF, 4+ KE, 4+ ADF/PF. Sensory exam notable for stocking glove sensory loss in the feet. No limb ataxia or cerebellar signs. No abnormal tone appreciated.  I did not see nystagmus during positional testing but was limited by his position in bed.    Psychiatric:        Mood and Affect: Mood  normal.        Behavior: Behavior normal.     Results for orders placed or performed during the hospital encounter of 09/19/23 (from the past 48 hours)  CBC     Status: Abnormal   Collection Time: 09/23/23  4:50 AM  Result Value Ref Range   WBC 4.7 4.0 - 10.5 K/uL   RBC 3.93 (L) 4.22 - 5.81 MIL/uL   Hemoglobin 12.2 (L) 13.0 - 17.0 g/dL   HCT 63.3 (L) 60.9 - 47.9 %   MCV 93.1 80.0 - 100.0 fL   MCH 31.0 26.0 - 34.0 pg   MCHC 33.3 30.0 - 36.0 g/dL   RDW 86.3 88.4 - 84.4 %   Platelets 166 150 - 400 K/uL   nRBC 0.0 0.0 - 0.2 %    Comment: Performed at [facility], [address], [place], [place] [address]  Basic metabolic panel with GFR     Status: Abnormal   Collection Time: 09/23/23  4:50 AM  Result Value Ref Range   Sodium 141 135 - 145 mmol/L   Potassium 3.7 3.5 - 5.1 mmol/L   Chloride 110 98 - 111 mmol/L   CO2 22 22 - 32 mmol/L   Glucose, Bld 163 (H) 70 - 99 mg/dL    Comment: Glucose reference range applies only to samples taken after fasting for at least 8 hours.   BUN 20 8 - 23 mg/dL   Creatinine, Ser 8.71 (H) 0.61 - 1.24 mg/dL   Calcium 8.1 (L) 8.9 - 10.3 mg/dL   GFR,  Estimated 58 (L) >60 mL/min    Comment: (NOTE) Calculated using the CKD-EPI Creatinine Equation (2021)    Anion gap 9 5 - 15    Comment: Performed at [facility], [address], [place], [place] [address]  Magnesium      Status: None   Collection Time: 09/23/23  4:50 AM  Result Value Ref Range   Magnesium  2.0 1.7 - 2.4 mg/dL    Comment: Performed at [facility], [address], [place], [place] [address]   No results found.    Blood pressure 130/81, pulse 68, temperature 98.3 F (36.8 C), temperature source Oral, resp. rate 20, SpO2 99%.  Medical Problem List and Plan: 1. Functional deficits secondary to debility after urosepsis with exacerbation of baseline vertebral-basilar insufficiency   -patient may shower  -ELOS/Goals: 7-10 days, mod I goals 2.  Antithrombotics: -DVT/anticoagulation:  Pharmaceutical: Lovenox  -antiplatelet therapy: Was not on ASA PTA--will decrease ASA to 81 mg due to hx of temporal bleed.  3. Pain Management: Tylenol  prn.  4. Mood/Behavior/Sleep: LCSW to follow for evaluation and support.   -antipsychotic agents: N/A 5. Neuropsych/cognition: This patient is capable of making decisions on his own behalf. 6. Skin/Wound Care: Routine pressure relief measures.  7. Fluids/Electrolytes/Nutrition: Monitor I/O. Check CMET in am. 10.  Sepsis secondary to E. Coli bacteremia -pt received 5 days of ceftriaxone  -begin augmentin  for 5 additional days thru 6/28  --Cystitis likely due to UTI,  11.  Wheezing: Does have elevated right diaphragm with low volumes. Hx of 3 PPD smoking in the past --Will monitor respiratory status with increase in activity.   --Encourage pulmonary hygiene. Will add incentive spirometry.  12.  H/o VB insufficency w/Left temporal encephalomalacia: Acute on chronic vertigo.  --Continue vestibular tx. Meclizine  prn as at home.  15.  Gait disorder w/falls: Likely multifactorial. Will also order orthostatic vitals to rule out as  cause.  --  Vestibular Tx, awareness of deficits as well as safety.  11.  Acute renal failure: SCr up to 1.3 since last check 03/13 likely due to cystitis, mobic and fall.  --Will monitor voiding to rule out retention as cause.  12.  H/o of partial complex seizures: On Tegretol  400 mg TID per  06/2023 visit w/Dr. Gregg. Random level elevated (after am dose?)  --seizures consist of uncontrollable movement of hands mainly rubbing abdomen.   --will check tegretol  level in am  13. H/o of prostate cancer: Has urge incontinence/frequency. Continue dutasteride  and tamsulosin .   --toilet patient every 2-3 hours. Monitor voiding with PVR checks.  14. Hypomagnesemia: Was 1.6 @ admission and improved 2.0.  --will recheck level in am.  15. Advanced Cervical DDD/ Lumbar spondylolisthesis:  Recent Rx- percocet 5 on 06/16 #30 per Dr. Hughie.   --Was also on Mobic and will not resume at this time.  16. Constipation: Will add daily miralax . Sorbitol today if no results.       Sharlet GORMAN Schmitz, PA-C 09/23/2023

## 2023-09-23 NOTE — Plan of Care (Signed)

## 2023-09-23 NOTE — Progress Notes (Signed)
 Physical Therapy Treatment Patient Details Name: Brandon Flores MRN: [id number] DOB: Apr 24, 1945 Today's Date: 09/23/2023   History of Present Illness 78 year old male comes into the hospital with dizziness and slurred speech. PMH HLD, hypothyroidism, prostate cancer, BPH, vertebrobasilar insufficiency syndrome, encephalomalacia.    PT Comments  Pt with significant improvement in compared to Saturday's session, however, still only feels about 50% back to baseline. Pt ambulating limited hallway distances with min assist for dynamic gait. Reports bilateral feet numbness and hyperextends knees to compensate. Pt scoring 12/24 on the DGI, indicating he is at high risk for falls. Would benefit from AIR to address deficits and maximize functional mobility.    If plan is discharge home, recommend the following: Assistance with cooking/housework;Assist for transportation;Help with stairs or ramp for entrance;A little help with walking and/or transfers;A little help with bathing/dressing/bathroom   Can travel by private vehicle        Equipment Recommendations  None recommended by PT    Recommendations for Other Services Rehab consult     Precautions / Restrictions Precautions Precautions: Fall Recall of Precautions/Restrictions: Intact Restrictions Weight Bearing Restrictions Per Provider Order: No     Mobility  Bed Mobility Overal bed mobility: Modified Independent                  Transfers Overall transfer level: Needs assistance Equipment used: None Transfers: Sit to/from Stand Sit to Stand: Contact guard assist           General transfer comment: Initially bracing back onto bed    Ambulation/Gait Ambulation/Gait assistance: Min assist Gait Distance (Feet): 120 Feet (120, 100, 100) Assistive device: None Gait Pattern/deviations: Trunk flexed, Step-through pattern, Decreased stride length, Decreased dorsiflexion - right, Decreased dorsiflexion - left Gait  velocity: reduced Gait velocity interpretation: <1.8 ft/sec, indicate of risk for recurrent falls   General Gait Details: Pt requiring minA for balance, has difficulty controlling forward momentum with decreased bilateral foot strike at initial contact. Cues for softer step, with pt was able to carryover briefly. Pt with increased bilateral knee hyperextension in mid stance and feels his knees locking out.   Stairs             Wheelchair Mobility     Tilt Bed    Modified Rankin (Stroke Patients Only)       Balance Overall balance assessment: Needs assistance Sitting-balance support: Feet supported Sitting balance-Leahy Scale: Good     Standing balance support: No upper extremity supported, During functional activity Standing balance-Leahy Scale: Fair                   Standardized Balance Assessment Standardized Balance Assessment : Dynamic Gait Index   Dynamic Gait Index Level Surface: Mild Impairment Change in Gait Speed: Moderate Impairment Gait with Horizontal Head Turns: Mild Impairment Gait with Vertical Head Turns: Mild Impairment Gait and Pivot Turn: Moderate Impairment Step Over Obstacle: Moderate Impairment Step Around Obstacles: Moderate Impairment Steps: Mild Impairment Total Score: 12      Communication Communication Communication: No apparent difficulties  Cognition Arousal: Alert Behavior During Therapy: WFL for tasks assessed/performed   PT - Cognitive impairments: No apparent impairments                         Following commands: Intact      Cueing Cueing Techniques: Verbal cues  Exercises      General Comments        Pertinent Vitals/Pain Pain Assessment  Pain Assessment: No/denies pain    Home Living   Living Arrangements: Spouse/significant other Available Help at Discharge: Family;Available 24 hours/day Type of Home: House Home Access: Stairs to enter Entrance Stairs-Rails: Can reach  both Entrance Stairs-Number of Steps: 3+1 from outside to inside, 3 to get to den area Alternate [employer] of Steps: 14 Home Layout: Two level;Bed/bath upstairs        Prior Function            PT Goals (current goals can now be found in the care plan section) Acute Rehab PT Goals Patient Stated Goal: to go to AIR before home PT Goal Formulation: With patient/family Time For Goal Achievement: 10/05/23 Potential to Achieve Goals: Good Progress towards PT goals: Progressing toward goals    Frequency    Min 3X/week      PT Plan      Co-evaluation              AM-PAC PT 6 Clicks Mobility   Outcome Measure  Help needed turning from your back to your side while in a flat bed without using bedrails?: None Help needed moving from lying on your back to sitting on the side of a flat bed without using bedrails?: None Help needed moving to and from a bed to a chair (including a wheelchair)?: A Little Help needed standing up from a chair using your arms (e.g., wheelchair or bedside chair)?: A Little Help needed to walk in hospital room?: A Little Help needed climbing 3-5 steps with a railing? : A Little 6 Click Score: 20    End of Session Equipment Utilized During Treatment: Gait belt Activity Tolerance: Patient tolerated treatment well Patient left: in bed;with call bell/phone within reach;with bed alarm set Nurse Communication: Mobility status PT Visit Diagnosis: Other abnormalities of gait and mobility (R26.89);Dizziness and giddiness (R42)     Time: 8948-8872 PT Time Calculation (min) (ACUTE ONLY): 36 min  Charges:    $Therapeutic Activity: 23-37 mins PT General Charges $$ ACUTE PT VISIT: 1 Visit                     Aleck Daring, PT, DPT Acute Rehabilitation Services Office [phone]    Alayne ONEIDA Daring 09/23/2023, 12:16 PM

## 2023-09-23 NOTE — Discharge Summary (Signed)
 Physician Discharge Summary  Brandon Flores FMW:[id number] DOB: 03/06/1946 DOA: 09/19/2023  PCP: Brandon Sharper, MD  Admit date: 09/19/2023 Discharge date: 09/23/2023  Admitted From: home Disposition:  CIR  Recommendations for Outpatient Follow-up:  Follow up with PCP in 1-2 weeks  Home Health: none Equipment/Devices: none  Discharge Condition: stable CODE STATUS: Full code Diet Orders (From admission, onward)     Start     Ordered   09/19/23 1730  Diet regular Room service appropriate? Yes; Fluid consistency: Thin  Diet effective now       Comments: After swallow screen  Question Answer Comment  Room service appropriate? Yes   Fluid consistency: Thin      09/19/23 1643            Brief Narrative / Interim history: 78 year old male with HLD, hypothyroidism, prostate cancer, BPH, vertebrobasilar insufficiency syndrome, encephalomalacia comes into the hospital with dizziness and slurred speech.  He reports feeling well when he went to bed tonight prior to admission, but woke up around 3 AM and went to the bathroom, felt very dizzy similar to his prior vertigo episodes.  He was so weak with this dizziness that was unable to get back into his bed and ended up slipping on the floor.  He was brought to the hospital by family.  When he presented to the ED he was found to be febrile to 102, tachycardic, tachypneic.  He was satting well on room air.  Urinalysis had some evidence of infection.  He was admitted for presumed UTI versus TIA rule out  Hospital Course / Discharge diagnoses: Principal Problem:   Sepsis (HCC) Active Problems:   GERD (gastroesophageal reflux disease)   Gout   VBI (vertebrobasilar insufficiency)   Generalized convulsive epilepsy (HCC)   HLD (hyperlipidemia)   Obesity (BMI 30-39.9)   Malignant neoplasm of prostate (HCC)   Acute cystitis with hematuria   TIA (transient ischemic attack)   Principal problem Sepsis due to E. coli bacteremia, suspect  underlying UTI -patient admitted to the hospital with fever of 102, tachycardia, tachypnea.  Blood cultures overnight on admission speciated in 2 out of 2 bottles E. coli which appears to not have any significant resistance.  Patient was maintained on ceftriaxone  for 5 doses while hospitalized, he is now afebrile, white count normalized and sepsis physiology resolved.  He will be transitioned to Augmentin  for 5 additional days to complete a 10-day course.  He became weak following this episode, and will go to CIR.   Active problems UTI-on antibiotics as above   Hypoxemia, wheezing -intermittent he experiences upper airway wheezing, this has been happening at home for the past year.  Will need PFTs as an outpatient.  Is tolerating room air now.  2D echocardiogram done 6/20 shows LVEF 60-65%, no WMA, grade 1 diastolic dysfunction, normal RV. Vertigo, slurred speech, concern for TIA - MRI on admission was negative for stroke, did show sequela of remote hemorrhage in the anterior left temporal lobe.  She had PT follow-up Hypomagnesemia-replenish magnesium , continue to monitor History of seizures-continue Tegretol  Gout-continue home allopurinol  Hypothyroidism-continue Synthroid  History of prostate cancer-wearing depends at home, continue dutasteride , has a degree of incontinence  Sepsis ruled out   Discharge Instructions  Discharge Instructions     Ambulatory referral to Physical Therapy   Complete by: As directed    Vestibular therapy      Allergies as of 09/23/2023   No Known Allergies      Medication List  TAKE these medications    allopurinol  100 MG tablet Commonly known as: ZYLOPRIM  Take 1 tablet (100 mg total) by mouth daily.   amoxicillin -clavulanate 875-125 MG tablet Commonly known as: AUGMENTIN  Take 1 tablet by mouth 2 (two) times daily for 5 days. Start taking on: September 24, 2023   carbamazepine  200 MG tablet Commonly known as: TEGretol  Take 2 tablets (400 mg total)  by mouth 3 (three) times daily. WILL TRY SWITCH TO GENERIC   dutasteride  0.5 MG capsule Commonly known as: AVODART  Take 1 capsule (0.5 mg total) by mouth every evening.   levothyroxine  112 MCG tablet Commonly known as: SYNTHROID  Take 112 mcg by mouth daily.   meclizine  25 MG tablet Commonly known as: ANTIVERT  Take 1 tablet (25 mg total) by mouth 3 (three) times daily as needed for dizziness.   meloxicam 15 MG tablet Commonly known as: MOBIC Take 15 mg by mouth daily.   oxyCODONE -acetaminophen  5-325 MG tablet Commonly known as: PERCOCET/ROXICET Take 1 tablet by mouth every 8 (eight) hours as needed for severe pain.   tamsulosin  0.4 MG Caps capsule Commonly known as: FLOMAX  Take 2 capsules (0.8 mg total) by mouth daily after supper.        Follow-up Information     [facility]. Schedule an appointment as soon as possible for a visit.   Contact information: [address], [place], [place] [address]  Phone: [phone]                Consultations: none  Procedures/Studies:  ECHOCARDIOGRAM COMPLETE Result Date: 09/20/2023    ECHOCARDIOGRAM REPORT   Patient Name:   Brandon Flores Date of Exam: 09/20/2023 Medical Rec #:  [id number]       Height:       69.0 in Accession #:    [id number]      Weight:       237.0 lb Date of Birth:  [birth year]       BSA:          2.221 m Patient Age:    78 years        BP:           130/70 mmHg Patient Gender: M               HR:           83 bpm. Exam Location:  Inpatient Procedure: 2D Echo, Cardiac Doppler, Color Doppler and Intracardiac            Opacification Agent (Both Spectral and Color Flow Doppler were            utilized during procedure). Indications:    TIA  History:        Patient has no prior history of Echocardiogram examinations.  Sonographer:    Vella Key Referring Phys: [id number] ALEXANDER B MELVIN IMPRESSIONS  1. Left ventricular ejection fraction, by estimation, is 60 to 65%. The left ventricle has  normal function. The left ventricle has no regional wall motion abnormalities. There is mild left ventricular hypertrophy. Left ventricular diastolic parameters are consistent with Grade I diastolic dysfunction (impaired relaxation).  2. Right ventricular systolic function is normal. The right ventricular size is normal.  3. No bubble study performed.  4. The mitral valve is normal in structure. No evidence of mitral valve regurgitation. No evidence of mitral stenosis.  5. The aortic valve is tricuspid. There is moderate calcification of the aortic valve. There is moderate thickening of the  aortic valve. Aortic valve regurgitation is not visualized. Aortic valve sclerosis is present, with no evidence of aortic valve stenosis.  6. The inferior vena cava is normal in size with greater than 50% respiratory variability, suggesting right atrial pressure of 3 mmHg. FINDINGS  Left Ventricle: Left ventricular ejection fraction, by estimation, is 60 to 65%. The left ventricle has normal function. The left ventricle has no regional wall motion abnormalities. Strain was performed and the global longitudinal strain is indeterminate. The left ventricular internal cavity size was normal in size. There is mild left ventricular hypertrophy. Left ventricular diastolic parameters are consistent with Grade I diastolic dysfunction (impaired relaxation). Right Ventricle: The right ventricular size is normal. No increase in right ventricular wall thickness. Right ventricular systolic function is normal. Left Atrium: Left atrial size was normal in size. Right Atrium: Right atrial size was normal in size. Pericardium: There is no evidence of pericardial effusion. Mitral Valve: The mitral valve is normal in structure. No evidence of mitral valve regurgitation. No evidence of mitral valve stenosis. Tricuspid Valve: The tricuspid valve is normal in structure. Tricuspid valve regurgitation is mild . No evidence of tricuspid stenosis. Aortic  Valve: The aortic valve is tricuspid. There is moderate calcification of the aortic valve. There is moderate thickening of the aortic valve. Aortic valve regurgitation is not visualized. Aortic valve sclerosis is present, with no evidence of aortic valve stenosis. Pulmonic Valve: The pulmonic valve was normal in structure. Pulmonic valve regurgitation is trivial. No evidence of pulmonic stenosis. Aorta: The aortic root is normal in size and structure. Venous: The inferior vena cava is normal in size with greater than 50% respiratory variability, suggesting right atrial pressure of 3 mmHg. IAS/Shunts: No atrial level shunt detected by color flow Doppler. Additional Comments: 3D was performed not requiring image post processing on an independent workstation and was indeterminate.  LEFT VENTRICLE PLAX 2D LVIDd:         4.30 cm     Diastology LVIDs:         2.50 cm     LV e' medial:    5.44 cm/s LV PW:         1.20 cm     LV E/e' medial:  11.0 LV IVS:        1.30 cm     LV e' lateral:   9.68 cm/s LVOT diam:     2.00 cm     LV E/e' lateral: 6.2 LV SV:         68 LV SV Index:   31 LVOT Area:     3.14 cm  LV Volumes (MOD) LV vol d, MOD A2C: 59.3 ml LV vol d, MOD A4C: 67.6 ml LV vol s, MOD A2C: 16.4 ml LV vol s, MOD A4C: 22.6 ml LV SV MOD A2C:     42.9 ml LV SV MOD A4C:     67.6 ml LV SV MOD BP:      43.5 ml RIGHT VENTRICLE RV Basal diam:  3.90 cm RV S prime:     13.20 cm/s TAPSE (M-mode): 1.5 cm LEFT ATRIUM             Index        RIGHT ATRIUM           Index LA diam:        3.70 cm 1.67 cm/m   RA Area:     16.40 cm LA Vol (A2C):   67.7 ml 30.49 ml/m  RA  Volume:   47.30 ml  21.30 ml/m LA Vol (A4C):   49.9 ml 22.47 ml/m LA Biplane Vol: 60.9 ml 27.43 ml/m  AORTIC VALVE LVOT Vmax:   110.00 cm/s LVOT Vmean:  78.200 cm/s LVOT VTI:    0.216 m  AORTA Ao Root diam: 3.50 cm Ao Asc diam:  3.70 cm MITRAL VALVE MV Area (PHT): 3.03 cm    SHUNTS MV Decel Time: 250 msec    Systemic VTI:  0.22 m MV E velocity: 59.70 cm/s   Systemic Diam: 2.00 cm MV A velocity: 84.40 cm/s MV E/A ratio:  0.71 Maude Emmer MD Electronically signed by Maude Emmer MD Signature Date/Time: 09/20/2023/3:06:38 PM    Final    DG CHEST PORT 1 VIEW Result Date: 09/20/2023 CLINICAL DATA:  10028 Wheezing [id number] EXAM: PORTABLE CHEST - 1 VIEW COMPARISON:  September 19, 2023 FINDINGS: Lower lung volumes with elevation of the right hemidiaphragm. Streaky bibasilar atelectasis. No focal airspace consolidation, pleural effusion, or pneumothorax. No cardiomegaly. No acute fracture or destructive lesion. Multilevel thoracic osteophytosis. Bilateral AC joint osteoarthritis. IMPRESSION: No acute cardiopulmonary abnormality. Electronically Signed   By: Rogelia Myers M.D.   On: 09/20/2023 12:43   MR BRAIN WO CONTRAST Result Date: 09/19/2023 CLINICAL DATA:  Neuro deficit, acute, stroke suspected EXAM: MRI HEAD WITHOUT CONTRAST TECHNIQUE: Multiplanar, multiecho pulse sequences of the brain and surrounding structures were obtained without intravenous contrast. COMPARISON:  CTA head/neck earlier today. FINDINGS: Brain: No acute infarction, hemorrhage, hydrocephalus, extra-axial collection or mass lesion. Sequela of prior hemorrhage in the anterior left temporal lobe with hemosiderin deposition and ossific flow malacia. Vascular: Normal flow voids. Skull and upper cervical spine: Normal marrow signal. Sinuses/Orbits: Clear sinuses.  No acute orbital findings. Other: No mastoid effusions. IMPRESSION: 1. No evidence of acute intracranial abnormality. 2. Sequela of remote hemorrhage in the anterior left temporal lobe. Electronically Signed   By: Gilmore GORMAN Molt M.D.   On: 09/19/2023 21:32   CT ANGIO HEAD NECK W WO CM Result Date: 09/19/2023 CLINICAL DATA:  78 year old male with TIA, dizziness. EXAM: CT ANGIOGRAPHY HEAD AND NECK WITH AND WITHOUT CONTRAST TECHNIQUE: Multidetector CT imaging of the head and neck was performed using the standard protocol during bolus administration  of intravenous contrast. Multiplanar CT image reconstructions and MIPs were obtained to evaluate the vascular anatomy. Carotid stenosis measurements (when applicable) are obtained utilizing NASCET criteria, using the distal internal carotid diameter as the denominator. RADIATION DOSE REDUCTION: This exam was performed according to the departmental dose-optimization program which includes automated exposure control, adjustment of the mA and/or kV according to patient size and/or use of iterative reconstruction technique. CONTRAST:  85mL OMNIPAQUE  IOHEXOL  350 MG/ML SOLN COMPARISON:  Head CT 05/06/2023.  Brain MRI 06/29/2022. Neck CT 05/06/2023. FINDINGS: CT HEAD FINDINGS Brain: Chronic left temporal lobe encephalomalacia with dystrophic calcifications. Stable gray-white matter differentiation throughout the brain. Stable cerebral volume. No midline shift, ventriculomegaly, mass effect, evidence of mass lesion, intracranial hemorrhage or evidence of cortically based acute infarction. Gray-white matter differentiation is within normal limits throughout the brain. Stretch that Vascular: No suspicious intracranial vascular hyperdensity. Calcified atherosclerosis at the skull base. With Skull: Stable, intact. Sinuses/Orbits: Tympanic cavities, Visualized paranasal sinuses and mastoids are stable and well aerated. Other: Visualized orbits and scalp soft tissues are within normal limits. CTA NECK Skeleton: Absent dentition. Widespread cervical spine degeneration. No acute osseous abnormality identified. Upper chest: Negative. Other neck: Nonvascular neck soft tissue spaces are within normal limits. Aortic arch: Calcified aortic atherosclerosis.  3  vessel arch. Right carotid system: Brachiocephalic artery, right CCA origin calcified plaque. No significant stenosis, proximal right CCA detail degraded by dense subclavian venous contrast streak artifact. Right carotid bifurcation plaque with moderate to severe combined soft and  calcified plaque of the proximal right ICA. Subsequent stenosis maximal at the distal bulb is high-grade and 65 to 70 % with respect to the distal vessel (series 9, image 198, series 10, image 150). The vessel remains patent with mild additional calcified plaque and also tortuosity below the skull base. Numerically estimated at Left carotid system: Calcified left CCA origin plaque with 50 % stenosis with respect to the distal vessel. Calcified plaque at the left carotid bifurcation, ICA origin and bulb also resulting in 50 % stenosis with respect to the distal vessel. Tortuosity distal to the stenosis. Vertebral arteries: Right subclavian origin calcified plaque without stenosis. Calcified right vertebral artery origin, and occluded right vertebral origin or V1 segment. Highly diminutive right vertebral artery on February neck CT, and diminished distal right vertebral artery flow void on brain MRI last year, as well as a brain MRI 03/08/2014. No significant reconstituted enhancement by CTA today. Calcified proximal left subclavian artery plaque without significant stenosis. Patent left vertebral artery origin. Left vertebral artery is patent with tortuosity in the neck, no stenosis to the skull base. CTA HEAD Posterior circulation: Distal left vertebral artery is patent and supplies the basilar. There is evidence of retrograde enhancement of the distal right vertebral artery and right PICA origin both of which appear atherosclerotic. No left V4 stenosis despite plaque. There is moderate proximal basilar artery plaque and stenosis series 13, image 27. Mild distal basilar irregularity. SCA and right PCA origin remain patent. Fetal type left PCA origin. Right posterior communicating artery diminutive or absent. Bilateral PCA branches are patent although irregular. Moderate left P2 and P3 segment stenosis. Mild right P2 segment stenosis. Anterior circulation: Both ICA siphons are patent. Cavernous left ICA calcified  plaque and stenosis is moderate. Similar moderate left ICA supraclinoid plaque and stenosis. Normal left posterior communicating artery origin. Right ICA siphon calcified plaque with severe supraclinoid stenosis series 11, image 117. Distal right ICA remains patent. Patent carotid termini. Patent MCA and ACA origins. But severe left ACA origin stenosis series 13, image 32. Anterior communicating artery and other ACA branches are patent. There is moderate irregularity and stenosis of the right ACA A2 on series 14, image 24. Left MCA M1 segment is patent with mild to moderate irregularity and stenosis (series 12, image 24). Patent left MCA bifurcation. Mild left MCA branch irregularity without occlusion. Contralateral right MCA M1 segment is patent with mild irregularity. Patent right MCA branches with mild irregularity. Venous sinuses: Patent. Anatomic variants: Fetal type left PCA origin. Review of the MIP images confirms the above findings IMPRESSION: 1. Negative for emergent large vessel occlusion. Positive for advanced atherosclerosis in the head and neck, most notable for: - chronic Right Vertebral Artery poor flow or occlusion. - Moderate stenosis proximal Basilar artery. - Tandem High-grade stenoses of the Right ICA: 65-70% Right ICA bulb stenosis and Severe supraclinoid Right ICA siphon stenosis. - Severe stenosis Left ACA origin. - 50% stenosis Left CCA origin and Left ICA bulb. - up to Moderate stenoses of the distal Left PCA, Right ACA A2, and Left MCA M1 segments. 2. No acute intracranial abnormality by CT. Chronic left temporal lobe encephalomalacia. 3.  Aortic Atherosclerosis (ICD10-I70.0). Electronically Signed   By: VEAR Hurst M.D.   On: 09/19/2023 11:33  CT ABDOMEN PELVIS W CONTRAST Result Date: 09/19/2023 CLINICAL DATA:  Dizzy this morning. History of vertigo. Prostate cancer. Seizures. Abdominal pain. EXAM: CT ABDOMEN AND PELVIS WITH CONTRAST TECHNIQUE: Multidetector CT imaging of the abdomen and  pelvis was performed using the standard protocol following bolus administration of intravenous contrast. RADIATION DOSE REDUCTION: This exam was performed according to the departmental dose-optimization program which includes automated exposure control, adjustment of the mA and/or kV according to patient size and/or use of iterative reconstruction technique. CONTRAST:  85mL OMNIPAQUE  IOHEXOL  350 MG/ML SOLN COMPARISON:  02/07/2023. FINDINGS: Lower chest: Emphysema. Mild cardiomegaly, without pericardial or pleural effusion. Multivessel coronary artery atherosclerosis. Mild right hemidiaphragm elevation. Hepatobiliary: Normal liver. Dependent 21 mm gallstone without acute cholecystitis or biliary duct dilatation. Pancreas: Normal, without mass or ductal dilatation. Spleen: Normal in size, without focal abnormality. Adrenals/Urinary Tract: Normal adrenal glands. Upper pole left renal 2 mm lesion is too small to characterize but most likely a cyst . In the absence of clinically indicated signs/symptoms require(s) no independent follow-up. No hydronephrosis. The bladder is mildly thick walled with pericystic edema including on 86/7. Dependent density within the bladder is most likely early contrast excretion including on 87/7. Stomach/Bowel: Normal stomach, without wall thickening. Scattered colonic diverticula. Normal terminal ileum. Appendix not visualized. Normal small bowel. Vascular/Lymphatic: Aortic atherosclerosis. No abdominopelvic adenopathy. Reproductive: Mild prostatomegaly.  Radiation seeds within. Other: No significant free fluid. No free intraperitoneal air. Small fat containing left inguinal hernia. Musculoskeletal: L4-5 fixation. L3 sclerotic lesion is present back to at least 2018 and can be presumed a bone island. IMPRESSION: 1. Bladder wall thickening with new pericystic edema, suspicious for cystitis. 2. No other acute process. 3. Hyperattenuation in the dependent bladder is favored to represent early  contrast excretion. A new bladder stone since 02/07/2023 is felt less likely. 4. Cholelithiasis 5. Coronary artery atherosclerosis. Aortic Atherosclerosis (ICD10-I70.0). Emphysema (ICD10-J43.9). Electronically Signed   By: Rockey Kilts M.D.   On: 09/19/2023 11:12   DG Chest Port 1 View Result Date: 09/19/2023 CLINICAL DATA:  Sepsis EXAM: PORTABLE CHEST 1 VIEW COMPARISON:  Chest radiograph dated 05/15/2005 FINDINGS: Normal lung volumes. No focal consolidations. No pleural effusion or pneumothorax. The heart size and mediastinal contours are within normal limits. No acute osseous abnormality. IMPRESSION: No acute disease. Electronically Signed   By: Limin  Xu M.D.   On: 09/19/2023 09:39     Subjective: - no chest pain, shortness of breath, no abdominal pain, nausea or vomiting.   Discharge Exam: BP 130/81 (BP Location: Left Arm)   Pulse 68   Temp 98.3 F (36.8 C) (Oral)   Resp 20   SpO2 99%   General: Pt is alert, awake, not in acute distress Cardiovascular: RRR, S1/S2 +, no rubs, no gallops Respiratory: CTA bilaterally, no wheezing, no rhonchi Abdominal: Soft, NT, ND, bowel sounds + Extremities: no edema, no cyanosis    The results of significant diagnostics from this hospitalization (including imaging, microbiology, ancillary and laboratory) are listed below for reference.     Microbiology: Recent Results (from the past 240 hours)  Blood Culture (routine x 2)     Status: Abnormal (Preliminary result)   Collection Time: 09/19/23  8:41 AM   Specimen: BLOOD  Result Value Ref Range Status   Specimen Description   Final    BLOOD RIGHT ANTECUBITAL Performed at [facility], [address] [place], [place] [address]    Special Requests   Final    BOTTLES DRAWN AEROBIC AND ANAEROBIC Blood  Culture adequate volume Performed at [facility], [address] [place], [place] [address]    Culture  Setup Time   Final    GRAM NEGATIVE RODS IN  BOTH AEROBIC AND ANAEROBIC BOTTLES CRITICAL RESULT CALLED TO, READ BACK BY AND VERIFIED WITH: MAYA JACQULIN ORN [id number] FCP Performed at [facility], [address] [place], [place] [address]    Culture ESCHERICHIA COLI (A)  Final   Report Status PENDING  Incomplete   Organism ID, Bacteria ESCHERICHIA COLI  Final   Organism ID, Bacteria ESCHERICHIA COLI  Final      Susceptibility   Escherichia coli - KIRBY BAUER*    CEFAZOLIN INTERMEDIATE Intermediate    Escherichia coli - MIC*    AMPICILLIN 8 SENSITIVE Sensitive     CEFEPIME <=0.12 SENSITIVE Sensitive     CEFTAZIDIME <=1 SENSITIVE Sensitive     CEFTRIAXONE  <=0.25 SENSITIVE Sensitive     CIPROFLOXACIN <=0.25 SENSITIVE Sensitive     GENTAMICIN <=1 SENSITIVE Sensitive     IMIPENEM <=0.25 SENSITIVE Sensitive     TRIMETH /SULFA  <=20 SENSITIVE Sensitive     AMPICILLIN/SULBACTAM <=2 SENSITIVE Sensitive     PIP/TAZO <=4 SENSITIVE Sensitive ug/mL    * ESCHERICHIA COLI    ESCHERICHIA COLI  Blood Culture ID Panel (Reflexed)     Status: Abnormal   Collection Time: 09/19/23  8:41 AM  Result Value Ref Range Status   Enterococcus faecalis NOT DETECTED NOT DETECTED Final   Enterococcus Faecium NOT DETECTED NOT DETECTED Final   Listeria monocytogenes NOT DETECTED NOT DETECTED Final   Staphylococcus species NOT DETECTED NOT DETECTED Final   Staphylococcus aureus (BCID) NOT DETECTED NOT DETECTED Final   Staphylococcus epidermidis NOT DETECTED NOT DETECTED Final   Staphylococcus lugdunensis NOT DETECTED NOT DETECTED Final   Streptococcus species NOT DETECTED NOT DETECTED Final   Streptococcus agalactiae NOT DETECTED NOT DETECTED Final   Streptococcus pneumoniae NOT DETECTED NOT DETECTED Final   Streptococcus pyogenes NOT DETECTED NOT DETECTED Final   A.calcoaceticus-baumannii NOT DETECTED NOT DETECTED Final   Bacteroides fragilis NOT DETECTED NOT DETECTED Final   Enterobacterales DETECTED (A) NOT DETECTED Final    Comment:  Enterobacterales represent a large order of gram negative bacteria, not a single organism. CRITICAL RESULT CALLED TO, READ BACK BY AND VERIFIED WITH: PHARMD JACQULIN ORN [id number] FCP    Enterobacter cloacae complex NOT DETECTED NOT DETECTED Final   Escherichia coli DETECTED (A) NOT DETECTED Final    Comment: CRITICAL RESULT CALLED TO, READ BACK BY AND VERIFIED WITH: PHARMD JACQULIN ORN [id number] FCP    Klebsiella aerogenes NOT DETECTED NOT DETECTED Final   Klebsiella oxytoca NOT DETECTED NOT DETECTED Final   Klebsiella pneumoniae NOT DETECTED NOT DETECTED Final   Proteus species NOT DETECTED NOT DETECTED Final   Salmonella species NOT DETECTED NOT DETECTED Final   Serratia marcescens NOT DETECTED NOT DETECTED Final   Haemophilus influenzae NOT DETECTED NOT DETECTED Final   Neisseria meningitidis NOT DETECTED NOT DETECTED Final   Pseudomonas aeruginosa NOT DETECTED NOT DETECTED Final   Stenotrophomonas maltophilia NOT DETECTED NOT DETECTED Final   Candida albicans NOT DETECTED NOT DETECTED Final   Candida auris NOT DETECTED NOT DETECTED Final   Candida glabrata NOT DETECTED NOT DETECTED Final   Candida krusei NOT DETECTED NOT DETECTED Final   Candida parapsilosis NOT DETECTED NOT DETECTED Final   Candida tropicalis NOT DETECTED NOT DETECTED Final   Cryptococcus neoformans/gattii NOT DETECTED NOT DETECTED Final   CTX-M  ESBL NOT DETECTED NOT DETECTED Final   Carbapenem resistance IMP NOT DETECTED NOT DETECTED Final   Carbapenem resistance KPC NOT DETECTED NOT DETECTED Final   Carbapenem resistance NDM NOT DETECTED NOT DETECTED Final   Carbapenem resist OXA 48 LIKE NOT DETECTED NOT DETECTED Final   Carbapenem resistance VIM NOT DETECTED NOT DETECTED Final    Comment: Performed at [facility], [address], [place], [place] [address]  Resp panel by RT-PCR (RSV, Flu A&B, Covid) Anterior Nasal Swab     Status: None   Collection Time: 09/19/23  9:00 AM   Specimen: Anterior Nasal Swab   Result Value Ref Range Status   SARS Coronavirus 2 by RT PCR NEGATIVE NEGATIVE Final    Comment: (NOTE) SARS-CoV-2 target nucleic acids are NOT DETECTED.  The SARS-CoV-2 RNA is generally detectable in upper respiratory specimens during the acute phase of infection. The lowest concentration of SARS-CoV-2 viral copies this assay can detect is 138 copies/mL. A negative result does not preclude SARS-Cov-2 infection and should not be used as the sole basis for treatment or other patient management decisions. A negative result may occur with  improper specimen collection/handling, submission of specimen other than nasopharyngeal swab, presence of viral mutation(s) within the areas targeted by this assay, and inadequate number of viral copies(<138 copies/mL). A negative result must be combined with clinical observations, patient history, and epidemiological information. The expected result is Negative.  Fact Sheet for Patients:  BloggerCourse.com  Fact Sheet for Healthcare Providers:  SeriousBroker.it  This test is no t yet approved or cleared by the [place]  FDA and  has been authorized for detection and/or diagnosis of SARS-CoV-2 by FDA under an Emergency Use Authorization (EUA). This EUA will remain  in effect (meaning this test can be used) for the duration of the COVID-19 declaration under Section 564(b)(1) of the Act, 21 U.S.C.section 360bbb-3(b)(1), unless the authorization is terminated  or revoked sooner.       Influenza A by PCR NEGATIVE NEGATIVE Final   Influenza B by PCR NEGATIVE NEGATIVE Final    Comment: (NOTE) The Xpert Xpress SARS-CoV-2/FLU/RSV plus assay is intended as an aid in the diagnosis of influenza from Nasopharyngeal swab specimens and should not be used as a sole basis for treatment. Nasal washings and aspirates are unacceptable for Xpert Xpress SARS-CoV-2/FLU/RSV testing.  Fact Sheet for  Patients: BloggerCourse.com  Fact Sheet for Healthcare Providers: SeriousBroker.it  This test is not yet approved or cleared by the [place]  FDA and has been authorized for detection and/or diagnosis of SARS-CoV-2 by FDA under an Emergency Use Authorization (EUA). This EUA will remain in effect (meaning this test can be used) for the duration of the COVID-19 declaration under Section 564(b)(1) of the Act, 21 U.S.C. section 360bbb-3(b)(1), unless the authorization is terminated or revoked.     Resp Syncytial Virus by PCR NEGATIVE NEGATIVE Final    Comment: (NOTE) Fact Sheet for Patients: BloggerCourse.com  Fact Sheet for Healthcare Providers: SeriousBroker.it  This test is not yet approved or cleared by the [place]  FDA and has been authorized for detection and/or diagnosis of SARS-CoV-2 by FDA under an Emergency Use Authorization (EUA). This EUA will remain in effect (meaning this test can be used) for the duration of the COVID-19 declaration under Section 564(b)(1) of the Act, 21 U.S.C. section 360bbb-3(b)(1), unless the authorization is terminated or revoked.  Performed at [facility], [address] [place], [place] [address]      Labs:  Basic Metabolic Panel: Recent Labs  Lab 09/19/23 0900 09/20/23 0540 09/21/23 0657 09/23/23 0450  NA 140 137 137 141  K 3.6 3.4* 3.7 3.7  CL 101 105 106 110  CO2 23 22 23 22   GLUCOSE 135* 148* 142* 163*  BUN 21 20 23 20   CREATININE 1.30* 1.40* 1.48* 1.28*  CALCIUM 9.3 7.7* 7.9* 8.1*  MG  --   --  1.6* 2.0   Liver Function Tests: Recent Labs  Lab 09/19/23 0900 09/20/23 0540 09/21/23 0657  AST 18 26 27   ALT 15 16 20   ALKPHOS 87 48 40  BILITOT 1.1 0.9 0.4  PROT 7.5 5.9* 5.8*  ALBUMIN 3.9 2.5* 2.3*   CBC: Recent Labs  Lab 09/19/23 0900 09/20/23 0540 09/21/23 0657 09/23/23 0450  WBC  11.9* 8.5 7.0 4.7  NEUTROABS 9.0*  --   --   --   HGB 14.4 12.3* 12.0* 12.2*  HCT 41.6 36.0* 34.8* 36.6*  MCV 91.0 92.1 92.6 93.1  PLT 199 156 130* 166   CBG: No results for input(s): GLUCAP in the last 168 hours. Hgb A1c No results for input(s): HGBA1C in the last 72 hours. Lipid Profile No results for input(s): CHOL, HDL, LDLCALC, TRIG, CHOLHDL, LDLDIRECT in the last 72 hours. Thyroid  function studies No results for input(s): TSH, T4TOTAL, T3FREE, THYROIDAB in the last 72 hours.  Invalid input(s): FREET3 Urinalysis    Component Value Date/Time   COLORURINE YELLOW 09/19/2023 0900   APPEARANCEUR HAZY (A) 09/19/2023 0900   LABSPEC 1.013 09/19/2023 0900   PHURINE 6.0 09/19/2023 0900   GLUCOSEU NEGATIVE 09/19/2023 0900   HGBUR MODERATE (A) 09/19/2023 0900   BILIRUBINUR NEGATIVE 09/19/2023 0900   KETONESUR NEGATIVE 09/19/2023 0900   PROTEINUR 100 (A) 09/19/2023 0900   UROBILINOGEN 0.2 03/08/2014 1211   NITRITE POSITIVE (A) 09/19/2023 0900   LEUKOCYTESUR LARGE (A) 09/19/2023 0900    FURTHER DISCHARGE INSTRUCTIONS:   Get Medicines reviewed and adjusted: Please take all your medications with you for your next visit with your Primary MD   Laboratory/radiological data: Please request your Primary MD to go over all hospital tests and procedure/radiological results at the follow up, please ask your Primary MD to get all Hospital records sent to his/her office.   In some cases, they will be blood work, cultures and biopsy results pending at the time of your discharge. Please request that your primary care M.D. goes through all the records of your hospital data and follows up on these results.   Also Note the following: If you experience worsening of your admission symptoms, develop shortness of breath, life threatening emergency, suicidal or homicidal thoughts you must seek medical attention immediately by calling 911 or calling your MD immediately  if  symptoms less severe.   You must read complete instructions/literature along with all the possible adverse reactions/side effects for all the Medicines you take and that have been prescribed to you. Take any new Medicines after you have completely understood and accpet all the possible adverse reactions/side effects.    Do not drive when taking Pain medications or sleeping medications (Benzodaizepines)   Do not take more than prescribed Pain, Sleep and Anxiety Medications. It is not advisable to combine anxiety,sleep and pain medications without talking with your primary care practitioner   Special Instructions: If you have smoked or chewed Tobacco  in the last 2 yrs please stop smoking, stop any regular Alcohol  and or any Recreational drug use.   Wear Seat belts while  driving.   Please note: You were cared for by a hospitalist during your hospital stay. Once you are discharged, your primary care physician will handle any further medical issues. Please note that NO REFILLS for any discharge medications will be authorized once you are discharged, as it is imperative that you return to your primary care physician (or establish a relationship with a primary care physician if you do not have one) for your post hospital discharge needs so that they can reassess your need for medications and monitor your lab values.  Time coordinating discharge: 40 minutes  SIGNED:  Nilda Fendt, MD, PhD 09/23/2023, 10:46 AM

## 2023-09-23 NOTE — PMR Pre-admission (Signed)
 PMR Admission Coordinator Pre-Admission Assessment  Patient: Brandon Flores is an 78 y.o., male MRN: [id number] DOB: [birth year] Height:   Weight:    Insurance Information HMO:     PPO:      PCP:      IPA:      80/20:      OTHER:  PRIMARY: Medicare A & B      Policy#: [id number]      Subscriber: patient  Eff. Date: 07/02/2011     Deduct: $1676.00       CIR: 100%      SNF: 20 full days Outpatient: 80%      Home Health: 100%       DME: 80%      SECONDARY: AARP      Policy#: [id number]     Phone#: [phone]    The "Data Collection Information Summary" for patients in Inpatient Rehabilitation Facilities with attached "Privacy Act Statement-Health Care Records" was provided and verbally reviewed with: Patient and Family  Emergency Contact Information Contact Information     Name Relation Home Work Mobile   Washta R Spouse   [phone]      Other Contacts   None on File     Current Medical History  Patient Admitting Diagnosis: Sepsis History of Present Illness: 78 year old male admitted to [facility] hospital 09/19/2023 with dizziness and slurred speech. When he presented to the ED he was found to be febrile to 102, tachycardic, tachypneic. He was satting well on room air. Urinalysis had some evidence of infection. UTI-on ceftriaxone , intermittent he experiences upper airway wheezing, this has been happening at home for the past year. Will need PFTs as an outpatient. Continue supplemental oxygen, wean off to room air as tolerated. 2D echocardiogram done 6/20 shows LVEF 60-65%, no WMA, grade 1 diastolic dysfunction, normal RV.  MRI on admission was negative for stroke, did show sequela of remote hemorrhage in the anterior left temporal lobe. Vertigo- MRI on admission was negative for stroke, did show sequela of remote hemorrhage in the anterior left temporal lobe. PMH HLD, hypothyroidism, prostate cancer, BPH, seizures,vertebrobasilar insufficiency syndrome, encephalomalacia.  Therapies recommend intensive rehab program at this time.   Complete NIHSS TOTAL: 0  Patient's medical record from [facility] has been reviewed by the rehabilitation admission coordinator and physician.  Past Medical History  Past Medical History:  Diagnosis Date   Cervical radiculopathy    Colon polyp    (2006- adenoma and tubulovillous adenoma; 2010-adenomas; repeat 201)-Dr. Vicci   Complex partial seizure disorder (HCC) 10/08/2014   GERD (gastroesophageal reflux disease)    Gout    Hypercholesterolemia    Lateral epicondylitis    2002   Migraine    headaches   Ophthalmalgia    Dr. Roz   Orthodontics    Dr. Hughie   Partial epilepsy with impairment of consciousness (HCC)    Plantar fasciitis    Prostate cancer (HCC)    Seizure disorder (HCC)    Dr. Cleotilde ([facility])   Seizures [facility])    UTI (urinary tract infection)    Vertebrobasilar insufficiency    Vertigo     Has the patient had major surgery during 100 days prior to admission? No  Family History   family history includes Alcohol abuse in his father; COPD in his sister; Cancer in his brother, brother, sister, and sister; Lung cancer in his brother, brother, sister, and sister.  Current Medications  Current Facility-Administered Medications:    acetaminophen  (TYLENOL ) tablet  650 mg, 650 mg, Oral, Q6H PRN, 650 mg at 09/22/23 1031 **OR** acetaminophen  (TYLENOL ) suppository 650 mg, 650 mg, Rectal, Q6H PRN, Melvin, Alexander B, MD   allopurinol  (ZYLOPRIM ) tablet 100 mg, 100 mg, Oral, Daily, Melvin, Alexander B, MD, 100 mg at 09/23/23 9055   aspirin  suppository 300 mg, 300 mg, Rectal, Daily **OR** aspirin  tablet 325 mg, 325 mg, Oral, Daily, Melvin, Alexander B, MD, 325 mg at 09/23/23 9055   carbamazepine  (TEGRETOL ) tablet 400 mg, 400 mg, Oral, TID, Melvin, Alexander B, MD, 400 mg at 09/23/23 9055   cefTRIAXone  (ROCEPHIN ) 2 g in sodium chloride  0.9 % 100 mL IVPB, 2 g, Intravenous, Q24H, Melvin, Alexander B, MD, Last Rate:  200 mL/hr at 09/22/23 1311, 2 g at 09/22/23 1311   dutasteride  (AVODART ) capsule 0.5 mg, 0.5 mg, Oral, QPM, Melvin, Alexander B, MD, 0.5 mg at 09/22/23 1723   ipratropium-albuterol  (DUONEB) 0.5-2.5 (3) MG/3ML nebulizer solution 3 mL, 3 mL, Nebulization, Q6H PRN, Sundil, Subrina, MD, 3 mL at 09/20/23 0758   levothyroxine  (SYNTHROID ) tablet 112 mcg, 112 mcg, Oral, Q0600, Melvin, Alexander B, MD, 112 mcg at 09/23/23 9444   meclizine  (ANTIVERT ) tablet 25 mg, 25 mg, Oral, TID PRN, Melvin, Alexander B, MD   Oral care mouth rinse, 15 mL, Mouth Rinse, PRN, Melvin, Alexander B, MD   polyethylene glycol (MIRALAX  / GLYCOLAX ) packet 17 g, 17 g, Oral, Daily PRN, Melvin, Alexander B, MD, 17 g at 09/23/23 0944   sodium chloride  flush (NS) 0.9 % injection 3 mL, 3 mL, Intravenous, Q12H, Melvin, Alexander B, MD, 3 mL at 09/23/23 9053   tamsulosin  (FLOMAX ) capsule 0.8 mg, 0.8 mg, Oral, QPC supper, Melvin, Alexander B, MD, 0.8 mg at 09/22/23 1723  Patients Current Diet:  Diet Order             Diet regular Room service appropriate? Yes; Fluid consistency: Thin  Diet effective now                   Precautions / Restrictions Precautions Precautions: Fall Restrictions Weight Bearing Restrictions Per Provider Order: No   Has the patient had 2 or more falls or a fall with injury in the past year? No  Prior Activity Level    Prior Functional Level Self Care: Did the patient need help bathing, dressing, using the toilet or eating? Independent  Indoor Mobility: Did the patient need assistance with walking from room to room (with or without device)? Independent  Stairs: Did the patient need assistance with internal or external stairs (with or without device)? Independent  Functional Cognition: Did the patient need help planning regular tasks such as shopping or remembering to take medications? Independent  Patient Information Are you of Hispanic, Latino/a,or Spanish origin?: A. No, not of Hispanic,  Latino/a, or Spanish origin What is your race?: A. White Do you need or want an interpreter to communicate with a doctor or health care staff?: 0. No  Patient's Response To:  Health Literacy and Transportation Is the patient able to respond to health literacy and transportation needs?: Yes Health Literacy - How often do you need to have someone help you when you read instructions, pamphlets, or other written material from your doctor or pharmacy?: Rarely In the past 12 months, has lack of transportation kept you from medical appointments or from getting medications?: No In the past 12 months, has lack of transportation kept you from meetings, work, or from getting things needed for daily living?: No  Home Assistive Devices /  Equipment Home Equipment: Shower seat, Medical laboratory scientific officer - single point, Rollator (4 wheels), Other (comment), Cane - quad  Prior Device Use: Indicate devices/aids used by the patient prior to current illness, exacerbation or injury? None of the above  Current Functional Level Cognition  Arousal/Alertness: Awake/alert Overall Cognitive Status: Impaired/Different from baseline Orientation Level: Oriented X4 Attention: Sustained Sustained Attention: Appears intact Memory: Impaired Memory Impairment: Storage deficit, Retrieval deficit (2/5 words) Awareness: Impaired Awareness Impairment:  (suspect intellectual) Problem Solving: Impaired Problem Solving Impairment: Functional basic, Verbal basic Safety/Judgment:  (questionable-needs supervision with meds)    Extremity Assessment (includes Sensation/Coordination)  Upper Extremity Assessment: Overall WFL for tasks assessed  Lower Extremity Assessment: Overall WFL for tasks assessed    ADLs  Overall ADL's : Needs assistance/impaired Eating/Feeding: Independent, Sitting Grooming: Standing, Contact guard assist Upper Body Bathing: Standing, Contact guard assist Lower Body Bathing: Sitting/lateral leans, Contact guard  assist Upper Body Dressing : Sitting, Set up Lower Body Dressing: Sitting/lateral leans, Contact guard assist Lower Body Dressing Details (indicate cue type and reason): doff/donned socks. Spouse reports he sometimes gets dizzy with that but no complaints with task today. Toilet Transfer: Contact guard assist, Ambulation Toilet Transfer Details (indicate cue type and reason): CGA pt with mild sway of balance when going to sit but caught  himself Toileting- Architect and Hygiene: Contact guard assist, Sit to/from stand Functional mobility during ADLs: Contact guard assist    Mobility  Overal bed mobility: Needs Assistance Bed Mobility: Supine to Sit, Sit to Supine Supine to sit: Contact guard, HOB elevated Sit to supine: Contact guard assist    Transfers  Overall transfer level: Needs assistance Equipment used: Rolling walker (2 wheels) Transfers: Sit to/from Stand Sit to Stand: Min assist General transfer comment: posterior lean when standing    Ambulation / Gait / Stairs / Wheelchair Mobility  Ambulation/Gait Ambulation/Gait assistance: Editor, commissioning (Feet): 25 Feet Assistive device: Rolling walker (2 wheels) Gait Pattern/deviations: Step-to pattern, Trunk flexed, Ataxic General Gait Details: pt requiring cues to maintain RW close to BOS and to maintain body within walker frame when turning, lateral instability noted, pt often flexed over RW with downward gaze to the floor Gait velocity: reduced Gait velocity interpretation: <1.31 ft/sec, indicative of household ambulator Stairs: Yes Stairs assistance: Contact guard assist Stair Management: Two rails, Alternating pattern, Forwards Number of Stairs: 2 General stair comments: Part of DGI.    Posture / Balance Dynamic Sitting Balance Sitting balance - Comments: intermittent posterior lean requiring CGA-minA to correct initially, improves throughout session to stand by assist Balance Overall balance  assessment: Needs assistance Sitting-balance support: Single extremity supported, Feet supported Sitting balance-Leahy Scale: Poor Sitting balance - Comments: intermittent posterior lean requiring CGA-minA to correct initially, improves throughout session to stand by assist Standing balance support: Bilateral upper extremity supported, Reliant on assistive device for balance Standing balance-Leahy Scale: Poor Standardized Balance Assessment Standardized Balance Assessment : Dynamic Gait Index Dynamic Gait Index Level Surface: Mild Impairment Change in Gait Speed: Moderate Impairment Gait with Horizontal Head Turns: Mild Impairment Gait with Vertical Head Turns: Mild Impairment Gait and Pivot Turn: Moderate Impairment Step Over Obstacle: Moderate Impairment Step Around Obstacles: Severe Impairment Steps: Mild Impairment Total Score: 11    Special needs/care consideration Oxygen currently using 2 liters, not on O2 at home and Skin intact   Previous Home Environment (from acute therapy documentation) Living Arrangements: Spouse/significant other  Lives With: Spouse Available Help at Discharge: Family, Available 24 hours/day Type of Home: House Home  Layout: Two level, Bed/bath upstairs Alternate Level Stairs-Rails: Right Alternate Level Stairs-Number of Steps: 14 Home Access: Stairs to enter Entrance Stairs-Rails: Can reach both Entrance Stairs-Number of Steps: 3+1 from outside to inside, 3 to get to [employer] area [employer] Shower/Tub: Engineer, manufacturing systems: Standard  Discharge Living Setting Plans for Discharge Living Setting: Patient's home, Lives with (comment) (spouse) Type of Home at Discharge: House Discharge Home Layout: Two level, Bed/bath upstairs Alternate Level Stairs-Rails: Right Alternate Level Stairs-Number of Steps: 14 Discharge Home Access: Stairs to enter Entrance Stairs-Rails: Can reach both Entrance Stairs-Number of Steps: 4 Discharge Bathroom  Shower/Tub: Tub/shower unit Discharge Bathroom Toilet: Standard Discharge Bathroom Accessibility: Yes How Accessible: Accessible via walker Does the patient have any problems obtaining your medications?: No  Social/Family/Support Systems Anticipated Caregiver: Alfreda Cones Anticipated Caregiver's Contact Information: [phone] Ability/Limitations of Caregiver: supervision only Caregiver Availability: 24/7 Discharge Plan Discussed with Primary Caregiver: Yes Is Caregiver In Agreement with Plan?: Yes Does Caregiver/Family have Issues with Lodging/Transportation while Pt is in Rehab?: No  Goals Patient/Family Goal for Rehab: Mod I PT, OT, independent with SLP Expected length of stay: 7-10 days Pt/Family Agrees to Admission and willing to participate: Yes Program Orientation Provided & Reviewed with Pt/Caregiver Including Roles  & Responsibilities: Yes  Barriers to Discharge: Insurance for SNF coverage  Decrease burden of Care through IP rehab admission: Othern/a  Possible need for SNF placement upon discharge: not anticipated  Patient Condition: I have reviewed medical records from [facility], spoken with TOC, and patient and spouse. I met with patient at the bedside for inpatient rehabilitation assessment.  Patient will benefit from ongoing PT, OT, and SLP, can actively participate in 3 hours of therapy a day 5 days of the week, and can make measurable gains during the admission.  Patient will also benefit from the coordinated team approach during an Inpatient Acute Rehabilitation admission.  The patient will receive intensive therapy as well as Rehabilitation physician, nursing, social worker, and care management interventions.  Due to safety, disease management, medication administration, pain management, and patient education the patient requires 24 hour a day rehabilitation nursing.  The patient is currently min A with mobility and basic ADLs.  Discharge setting and therapy post  discharge at home with home health is anticipated.  Patient has agreed to participate in the Acute Inpatient Rehabilitation Program and will admit today.  Preadmission Screen Completed By:  COSMO VEAR PLATTER, 09/23/2023 10:52 AM ______________________________________________________________________   Discussed status with Dr. Babs on 09/23/23 at 9:00 am and received approval for admission today.  Admission Coordinator:  COSMO VEAR PLATTER, time 12: 50pm /Date 09/23/23   Assessment/Plan: Diagnosis: debility after sepsis Does the need for close, 24 hr/day Medical supervision in concert with the patient's rehab needs make it unreasonable for this patient to be served in a less intensive setting? Yes Co-Morbidities requiring supervision/potential complications: vertigo/VBI, cervical radiculopathy, prostate cancer, seizures Due to bladder management, bowel management, safety, skin/wound care, disease management, medication administration, pain management, and patient education, does the patient require 24 hr/day rehab nursing? Yes Does the patient require coordinated care of a physician, rehab nurse, PT, OT, and SLP to address physical and functional deficits in the context of the above medical diagnosis(es)? Yes Addressing deficits in the following areas: balance, endurance, locomotion, strength, transferring, bowel/bladder control, bathing, dressing, feeding, grooming, toileting, cognition, and psychosocial support Can the patient actively participate in an intensive therapy program of at least 3 hrs of therapy 5 days a week? Yes The  potential for patient to make measurable gains while on inpatient rehab is excellent Anticipated functional outcomes upon discharge from inpatient rehab: modified independent PT, modified independent OT, independent SLP Estimated rehab length of stay to reach the above functional goals is: 7-10 days Anticipated discharge destination: Home 10. Overall Rehab/Functional  Prognosis: excellent   MD Signature: Arthea IVAR Gunther, MD, [facility] Physical Medicine & Rehabilitation Medical Director Rehabilitation Services 09/23/2023 //

## 2023-09-23 NOTE — Progress Notes (Signed)
 Inpatient Rehab Coordinator Note:  I met with patient and spouse at bedside to discuss CIR recommendations and goals/expectations of CIR stay.  We reviewed 3 hrs/day of therapy, physician follow up, and average length of stay 2 weeks (dependent upon progress) with goals of Mod I. Wife is available 24/7 for supervision. Will see how patient does with PT to ensure he needs continued therapy and plan to admit patient today if appropriate.   Rehab Admissons Coordinator Winifred Bodiford, [place], [place] [phone]

## 2023-09-23 NOTE — Progress Notes (Signed)
 Inpatient Rehab Admissions Coordinator:   Planning to admit to CIR today. All paperwork signed. Wife aware.   Rehab Admissons Coordinator Alondra Sahni, [place], [place] [phone]

## 2023-09-24 DIAGNOSIS — R5381 Other malaise: Secondary | ICD-10-CM | POA: Diagnosis not present

## 2023-09-24 LAB — COMPREHENSIVE METABOLIC PANEL WITH GFR
ALT: 23 U/L (ref 0–44)
AST: 24 U/L (ref 15–41)
Albumin: 2.3 g/dL — ABNORMAL LOW (ref 3.5–5.0)
Alkaline Phosphatase: 36 U/L — ABNORMAL LOW (ref 38–126)
Anion gap: 10 (ref 5–15)
BUN: 17 mg/dL (ref 8–23)
CO2: 25 mmol/L (ref 22–32)
Calcium: 8.3 mg/dL — ABNORMAL LOW (ref 8.9–10.3)
Chloride: 107 mmol/L (ref 98–111)
Creatinine, Ser: 1.03 mg/dL (ref 0.61–1.24)
GFR, Estimated: >60 mL/min (ref >60)
Glucose, Bld: 119 mg/dL — ABNORMAL HIGH (ref 70–99)
Potassium: 4.1 mmol/L (ref 3.5–5.1)
Sodium: 142 mmol/L (ref 135–145)
Total Bilirubin: 0.5 mg/dL (ref 0.0–1.2)
Total Protein: 5.8 g/dL — ABNORMAL LOW (ref 6.5–8.1)

## 2023-09-24 LAB — CBC
HCT: 39.6 % (ref 39.0–52.0)
Hemoglobin: 13.3 g/dL (ref 13.0–17.0)
MCH: 31.7 pg (ref 26.0–34.0)
MCHC: 33.6 g/dL (ref 30.0–36.0)
MCV: 94.5 fL (ref 80.0–100.0)
Platelets: 208 10*3/uL (ref 150–400)
RBC: 4.19 MIL/uL — ABNORMAL LOW (ref 4.22–5.81)
RDW: 13.4 % (ref 11.5–15.5)
WBC: 6.3 10*3/uL (ref 4.0–10.5)
nRBC: 0 % (ref 0.0–0.2)

## 2023-09-24 LAB — MAGNESIUM: Magnesium: 1.8 mg/dL (ref 1.7–2.4)

## 2023-09-24 LAB — CARBAMAZEPINE LEVEL, TOTAL: Carbamazepine Lvl: 11 ug/mL (ref 4.0–12.0)

## 2023-09-24 NOTE — Evaluation (Signed)
 Occupational Therapy Assessment and Plan  Patient Details  Name: Brandon Flores MRN: [id number] Date of Birth: 12/11/1945  OT Diagnosis: muscle weakness (generalized), decreased activity tolerance, and decreased balance strategies Rehab Potential: Rehab Potential (ACUTE ONLY): Good ELOS: 7-10 days   Today's Date: 09/24/2023 OT Individual Time: 0850-1000 OT Individual Time Calculation (min): 70 min     Hospital Problem: Principal Problem:   Debility   Past Medical History:  Past Medical History:  Diagnosis Date   Cervical radiculopathy    Colon polyp    (2006- adenoma and tubulovillous adenoma; 2010-adenomas; repeat 201)-Dr. Vicci   Complex partial seizure disorder (HCC) 10/08/2014   COVID-19 2021   GERD (gastroesophageal reflux disease)    Gout    Hypercholesterolemia    Lateral epicondylitis    2002   Migraine    headaches   Ophthalmalgia    Dr. Roz   Orthodontics    Dr. Hughie   Partial epilepsy with impairment of consciousness (HCC)    Plantar fasciitis    Prostate cancer (HCC)    Seizure disorder (HCC)    Dr. Cleotilde ([facility])   Seizures [facility])    Traumatic incomplete tear of left rotator cuff 2024   UTI (urinary tract infection)    Vertebrobasilar insufficiency    Vertigo    Past Surgical History:  Past Surgical History:  Procedure Laterality Date   BACK SURGERY     HEMORROIDECTOMY     NECK SURGERY     PROSTATE BIOPSY      Assessment & Plan Clinical Impression: Patient is a 78 year old male with history of left temporal hemorrhage with encephalomalacia, petit mal seizure disorder, BPPV, VB insuffiencey, gait disorder, prostate CA s/p XRT, frequency w/urge incontinence who was admitted on 09/19/23 with dizziness and fall with inability to get up. He reported significant weakness and slurred speech during this episode and presented to ED where he was found to be febrile with T-102, reported abominal pain and evidence of UTI. He was started on ceftriaxone  and  blood cultures done positive for E coli and Enterobacterials w/sensitivities  pending. Leucocytosis with low grade fevers and thrombocytopenia have resolved. Stress induced hyperglycemia noted with Hgb A1c- 5.0. MRI brain negative for acute changes and showed sequela of remote hemorrhage anterior left temporal lobe.    CTA head heck was negative for LVO and showed chronic R-VA occlusion or poor flow, moderate stenosis proximal BA, high grade stenosis R-ICA 65-70%, severe stenosis L-ACA origin, 50% stenosis L-CCA and L-ICA bulb and up to moderate stenosis distal left PCA, R-ACA A2 and L-MCA M1 segment. CT abdomen  pelvis done revealing bladder wall thickening with pericystic edema suspicious for cystitis and hyperattenuation in dependent bladder favored to be early contrast secretion rather than new bladder stone. 2D echo showed EF 60-65% with mild LVH. ST evaluation done revealing intact language and SLUMS 18/30 but wife and patient declined tx. Therapy evaluations completed revealing balance deficits with increase in vertiginous symptoms and requires min assist for mobility and CGA for ADLs. Patient transferred to CIR on 09/23/2023 .    Patient currently requires supervision-CGA with basic self-care skills secondary to muscle weakness, decreased cardiorespiratoy endurance, history of vertigo, and decreased standing balance and decreased balance strategies.  Prior to hospitalization, patient could complete ADLs with Mod I.   Patient will benefit from skilled intervention to increase independence with basic self-care skills and increase level of independence with iADL prior to discharge home with care partner.  Anticipate patient  will require intermittent supervision and follow up outpatient.  OT - End of Session Activity Tolerance: Tolerates 10 - 20 min activity with multiple rests Endurance Deficit: Yes OT Assessment Rehab Potential (ACUTE ONLY): Good OT Barriers to Discharge: Decreased caregiver  support OT Patient demonstrates impairments in the following area(s): Balance;Endurance;Safety OT Basic ADL's Functional Problem(s): Bathing;Dressing;Toileting OT Advanced ADL's Functional Problem(s): Simple Meal Preparation OT Transfers Functional Problem(s): Toilet;Tub/Shower OT Plan OT Intensity: Minimum of 1-2 x/day, 45 to 90 minutes OT Frequency: 5 out of 7 days OT Duration/Estimated Length of Stay: 7-10 days OT Treatment/Interventions: Balance/vestibular training;Discharge planning;Disease mangement/prevention;DME/adaptive equipment instruction;Community reintegration;Cognitive remediation/compensation;Functional mobility training;Neuromuscular re-education;Pain management;Patient/family education;Psychosocial support;Self Care/advanced ADL retraining;Skin care/wound managment;Therapeutic Activities;Therapeutic Exercise;UE/LE Strength taining/ROM OT Basic Self-Care Anticipated Outcome(s): Mod I OT Toileting Anticipated Outcome(s): Mod I OT Bathroom Transfers Anticipated Outcome(s): Mod I OT Recommendation Recommendations for Other Services: Neuropsych consult Patient destination: Home Follow Up Recommendations: Outpatient OT Equipment Recommended: To be determined   OT Evaluation Precautions/Restrictions  Precautions Precautions: Fall Recall of Precautions/Restrictions: Intact Precaution/Restrictions Comments: History of vertigo Restrictions Weight Bearing Restrictions Per Provider Order: No General Chart Reviewed: Yes Family/Caregiver Present: No Pain Pain Assessment Pain Scale: 0-10 Pain Score: 0-No pain Home Living/Prior Functioning Home Living Family/patient expects to be discharged to:: Private residence Living Arrangements: Spouse/significant other Available Help at Discharge: Family Type of Home: House Home Access: Stairs to enter [employer] of Steps: 3 STE through front door; level entry throught the side. Entrance Stairs-Rails: Can reach  both Home Layout: Two level, Bed/bath upstairs, 1/2 bath on main level Alternate Level Stairs-Number of Steps: 14 Alternate Level Stairs-Rails: Right Bathroom Shower/Tub: Tub/shower unit, Door (Reports having TTB and long-handled shower head, no grab bars.) Bathroom Toilet: Pharmacist, community: Yes  Lives With: Spouse IADL History Homemaking Responsibilities: No Current License: Yes Occupation: Retired Leisure and Hobbies: Gardening, watching TV, reading Prior Function Level of Independence: Independent with basic ADLs, Independent with homemaking with ambulation, Independent with gait, Independent with transfers (Use of hurrycane within community, furniture walks within home.) Driving: Yes Vision Baseline Vision/History: 1 Wears glasses (Readers) Ability to See in Adequate Light: 1 Impaired Patient Visual Report: No change from baseline Vision Assessment?: No apparent visual deficits Perception  Perception: Within Functional Limits Praxis Praxis: WFL Cognition Cognition Overall Cognitive Status: Within Functional Limits for tasks assessed Arousal/Alertness: Awake/alert Orientation Level: Person;Place;Situation Memory: Impaired Safety/Judgment: Appears intact Brief Interview for Mental Status (BIMS) Repetition of Three Words (First Attempt): 3 Temporal Orientation: Year: Correct Temporal Orientation: Month: Accurate within 5 days Temporal Orientation: Day: Correct Recall: Sock: Yes, no cue required Recall: Blue: Yes, after cueing (a color) Recall: Bed: No, could not recall BIMS Summary Score: 12 Sensation Sensation Light Touch: Impaired Detail Peripheral sensation comments: B-foot numbness Light Touch Impaired Details: Impaired RLE;Impaired LLE Coordination Gross Motor Movements are Fluid and Coordinated: No Fine Motor Movements are Fluid and Coordinated: Yes Coordination and Movement Description: Deficits due to generalized  weakness/debility. Motor  Motor Motor: Other (comment) Motor - Skilled Clinical Observations: Deficits due to generalized weakness/debility.  Trunk/Postural Assessment  Cervical Assessment Cervical Assessment: Within Functional Limits Thoracic Assessment Thoracic Assessment: Within Functional Limits Lumbar Assessment Lumbar Assessment: Exceptions to [facility] (Posterior pelvic tilt) Postural Control Postural Control: Deficits on evaluation Righting Reactions: Decreased/delayed Protective Responses: Decreased/delayed  Balance Balance Balance Assessed: Yes Static Sitting Balance Static Sitting - Balance Support: Feet supported Static Sitting - Level of Assistance: 7: Independent Dynamic Sitting Balance Dynamic Sitting - Balance Support: During functional activity Dynamic Sitting -  Level of Assistance: 6: Modified independent (Device/Increase time) Dynamic Sitting - Balance Activities: Forward lean/weight shifting;Lateral lean/weight shifting Static Standing Balance Static Standing - Balance Support: During functional activity Static Standing - Level of Assistance: 5: Stand by assistance (SUP-CGA) Dynamic Standing Balance Dynamic Standing - Balance Support: During functional activity Dynamic Standing - Level of Assistance: 5: Stand by assistance (CGA) Dynamic Standing - Balance Activities: Lateral lean/weight shifting;Forward lean/weight shifting;Reaching for objects Extremity/Trunk Assessment RUE Assessment RUE Assessment: Within Functional Limits LUE Assessment LUE Assessment: Within Functional Limits  Care Tool Care Tool Self Care Eating   Eating Assist Level: Independent with assistive device    Oral Care    Oral Care Assist Level: Independent with assistive device    Bathing   Body parts bathed by patient: Right arm;Left arm;Chest;Abdomen;Front perineal area;Buttocks;Right upper leg;Left upper leg;Right lower leg;Left lower leg;Face     Assist Level: Contact  Guard/Touching assist    Upper Body Dressing(including orthotics)   What is the patient wearing?: Pull over shirt   Assist Level: Set up assist    Lower Body Dressing (excluding footwear)   What is the patient wearing?: Underwear/pull up;Pants Assist for lower body dressing: Contact Guard/Touching assist    Putting on/Taking off footwear   What is the patient wearing?: Non-skid slipper socks Assist for footwear: Set up assist       Care Tool Toileting Toileting activity   Assist for toileting: Contact Guard/Touching assist     Care Tool Bed Mobility Roll left and right activity        Sit to lying activity        Lying to sitting on side of bed activity         Care Tool Transfers Sit to stand transfer        Chair/bed transfer         Toilet transfer   Assist Level: Contact Guard/Touching assist     Care Tool Cognition  Expression of Ideas and Wants Expression of Ideas and Wants: 4. Without difficulty (complex and basic) - expresses complex messages without difficulty and with speech that is clear and easy to understand  Understanding Verbal and Non-Verbal Content Understanding Verbal and Non-Verbal Content: 4. Understands (complex and basic) - clear comprehension without cues or repetitions   Memory/Recall Ability Memory/Recall Ability : Current season;Location of own room;Staff names and faces;That he or she is in a hospital/hospital unit   Refer to Care Plan for Long Term Goals  SHORT TERM GOAL WEEK 1 OT Short Term Goal 1 (Week 1): STGs=LTGs due to patient's estimated length of stay.  Recommendations for other services: Neuropsych   Skilled Therapeutic Intervention  Session began with introduction to OT role, OT POC, and general orientation to rehab unit/schedule. Pt completes full-body sponge-bathing alongside ambulatory room-level transfers with levels of assistance noted below. Pt able to teach back education provided on acute care floor for  transitional movements, including taking a minute to stand before walking.   ADL ADL Eating: Set up Where Assessed-Eating: Bed level Grooming: Modified independent Where Assessed-Grooming: Sitting at sink Upper Body Bathing: Supervision/safety;Setup Where Assessed-Upper Body Bathing: Sitting at sink Lower Body Bathing: Supervision/safety;Contact guard Where Assessed-Lower Body Bathing: Sitting at sink;Standing at sink Upper Body Dressing: Setup;Supervision/safety Where Assessed-Upper Body Dressing: Sitting at sink Lower Body Dressing: Supervision/safety;Contact guard Where Assessed-Lower Body Dressing: Sitting at sink Toileting: Supervision/safety;Contact guard Where Assessed-Toileting: Toilet;Bedside Commode Toilet Transfer: Close supervision;Contact guard Toilet Transfer Method: Proofreader: Gaffer: Not  assessed Film/video editor: Not assessed Mobility  Transfers Sit to Stand: Contact Guard/Touching assist;Supervision/Verbal cueing Stand to Sit: Contact Guard/Touching assist;Supervision/Verbal cueing   Discharge Criteria: Patient will be discharged from OT if patient refuses treatment 3 consecutive times without medical reason, if treatment goals not met, if there is a change in medical status, if patient makes no progress towards goals or if patient is discharged from hospital.  The above assessment, treatment plan, treatment alternatives and goals were discussed and mutually agreed upon: by patient  Nereida Habermann, OTR/L, MSOT  09/24/2023, 10:32 AM

## 2023-09-24 NOTE — Progress Notes (Signed)
 PROGRESS NOTE   Subjective/Complaints:  No events overnight, no acute complaints, patient in good spirits regarding therapies. Vitals are stable, a.m. orthostatics are negative.  Still little bit dizzy when getting up, but this is improving. A.m. labs are stable, BUN/creatinine significantly improved, albumin 2.3.   ROS: Denies fevers, chills, N/V, abdominal pain, constipation, diarrhea, SOB, cough, chest pain, new weakness or paraesthesias.    Objective:   No results found. Recent Labs    09/23/23 0450  WBC 4.7  HGB 12.2*  HCT 36.6*  PLT 166   Recent Labs    09/23/23 0450 09/24/23 0455  NA 141 142  K 3.7 4.1  CL 110 107  CO2 22 25  GLUCOSE 163* 119*  BUN 20 17  CREATININE 1.28* 1.03  CALCIUM 8.1* 8.3*    Intake/Output Summary (Last 24 hours) at 09/24/2023 0946 Last data filed at 09/24/2023 0148 Gross per 24 hour  Intake --  Output 300 ml  Net -300 ml        Physical Exam: Vital Signs Blood pressure 131/81, pulse 83, temperature 98.9 F (37.2 C), temperature source Oral, resp. rate 18, height 5' 9 (1.753 m), weight 107 kg, SpO2 96%. Constitutional: No apparent distress. Appropriate appearance for age.  Sitting up in wheelchair. HENT: No JVD. Neck Supple. Trachea midline. Atraumatic, normocephalic. Eyes: PERRLA. EOMI. Visual fields grossly intact. -No nystagmus appreciated in EOMI  Cardiovascular: RRR, no murmurs/rub/gallops. No Edema. Peripheral pulses 2+  Respiratory: CTAB. No rales, rhonchi, or wheezing. On RA.  Abdomen: + bowel sounds, normoactive. No distention or tenderness.  Skin: C/D/I. No apparent lesions. MSK:      No apparent deformity.  Full active range of motion all 4 extremities  Neurologic exam:  Alert and oriented x 3.  Normal insight and awareness. Intact Memory. Normal language and speech.  Cranial nerve exam unremarkable.  MMT: 5- out of 5 in all 4 extremities  throughout Sensory exam notable for stocking glove sensory loss in the feet.  No limb ataxia or cerebellar signs.  No abnormal tone appreciated.      Assessment/Plan: 1. Functional deficits which require 3+ hours per day of interdisciplinary therapy in a comprehensive inpatient rehab setting. Physiatrist is providing close team supervision and 24 hour management of active medical problems listed below. Physiatrist and rehab team continue to assess barriers to discharge/monitor patient progress toward functional and medical goals  Care Tool:  Bathing              Bathing assist       Upper Body Dressing/Undressing Upper body dressing        Upper body assist      Lower Body Dressing/Undressing Lower body dressing            Lower body assist       Toileting Toileting    Toileting assist Assist for toileting: Contact Guard/Touching assist     Transfers Chair/bed transfer  Transfers assist     Chair/bed transfer assist level: Contact Guard/Touching assist     Locomotion Ambulation   Ambulation assist              Walk 10 feet activity  Assist           Walk 50 feet activity   Assist           Walk 150 feet activity   Assist           Walk 10 feet on uneven surface  activity   Assist           Wheelchair     Assist               Wheelchair 50 feet with 2 turns activity    Assist            Wheelchair 150 feet activity     Assist          Blood pressure 131/81, pulse 83, temperature 98.9 F (37.2 C), temperature source Oral, resp. rate 18, height 5' 9 (1.753 m), weight 107 kg, SpO2 96%.  Medical Problem List and Plan: 1. Functional deficits secondary to debility after urosepsis with exacerbation of baseline vertebral-basilar insufficiency              -hx of left temporal hemorrhage             -patient may shower             -ELOS/Goals: 7-10 days, mod I goals--evals  pending -Stable to continue inpatient rehab  2.  Antithrombotics: -DVT/anticoagulation:  Pharmaceutical: Lovenox             -antiplatelet therapy: Was not on ASA PTA--will decrease ASA to 81 mg due to hx of temporal bleed.   3. Pain Management: Tylenol  prn.  No pain.  4. Mood/Behavior/Sleep: LCSW to follow for evaluation and support.              -antipsychotic agents: N/A 5. Neuropsych/cognition: This patient is capable of making decisions on his own behalf. 6. Skin/Wound Care: Routine pressure relief measures.  7. Fluids/Electrolytes/Nutrition: Monitor I/O. Check CMET in am. 10.  Sepsis secondary to E. Coli bacteremia -pt received 5 days of ceftriaxone  -begin augmentin  for 5 additional days thru 6/28  --Cystitis likely due to UTI,   11.  Wheezing: Does have elevated right diaphragm with low volumes. Hx of 3 PPD smoking in the past --Will monitor respiratory status with increase in activity.              --Encourage pulmonary hygiene. Will add incentive spirometry.   - 6/24: No wheezing appreciated on exam  12.  H/o VB insufficency w/Left temporal encephalomalacia: Acute on chronic vertigo.  --Continue vestibular tx. Meclizine  prn as at home.   15.  Gait disorder w/falls: Likely multifactorial. Will also order orthostatic vitals to rule out as cause.  --Vestibular Tx, awareness of deficits as well as safety.   11.  Acute renal failure: SCr up to 1.3 since last check 03/13 likely due to cystitis, mobic and fall.  --Will monitor voiding to rule out retention as cause.  -6/24: Creatinine improved to 1.03; BUN within normal limits.  Monitor fluid intakes, regular labs.  12.  H/o of partial complex seizures: On Tegretol  400 mg TID per  06/2023 visit w/Dr. Gregg. Random level elevated (after am dose?)             --seizures consist of uncontrollable movement of hands mainly rubbing abdomen.              --will check tegretol  level in am --11.0, within normal limits  13. H/o of  prostate cancer: Has urge incontinence/frequency. Continue dutasteride   and tamsulosin .             --toilet patient every 2-3 hours. Monitor voiding with PVR checks. - 6/24: No PVRs, has been voiding continent.  Will remind nursing.  14. Hypomagnesemia: Was 1.6 @ admission and improved 2.0.  --will recheck level in am.  -6-24: Level 1.8, within normal limits.  15. Advanced Cervical DDD/ Lumbar spondylolisthesis:  Recent Rx- percocet 5 on 06/16 #30 per Dr. Hughie.              --Was also on Mobic and will not resume at this time.   16. Constipation: Will add daily miralax . Sorbitol today if no results.    LBM 6/20 - will give sorbitol today 6/24    LOS: 1 days A FACE TO FACE EVALUATION WAS PERFORMED  Brandon Flores 09/24/2023, 9:46 AM

## 2023-09-24 NOTE — Plan of Care (Signed)
  Problem: RH Balance Goal: LTG Patient will maintain dynamic standing with ADLs (OT) Description: LTG:  Patient will maintain dynamic standing balance with assist during activities of daily living (OT)  Flowsheets (Taken 09/24/2023 1059) LTG: Pt will maintain dynamic standing balance during ADLs with: Independent with assistive device   Problem: RH Bathing Goal: LTG Patient will bathe all body parts with assist levels (OT) Description: LTG: Patient will bathe all body parts with assist levels (OT) Flowsheets (Taken 09/24/2023 1059) LTG: Pt will perform bathing with assistance level/cueing: Independent with assistive device    Problem: RH Dressing Goal: LTG Patient will perform lower body dressing w/assist (OT) Description: LTG: Patient will perform lower body dressing with assist, with/without cues in positioning using equipment (OT) Flowsheets (Taken 09/24/2023 1059) LTG: Pt will perform lower body dressing with assistance level of: Independent with assistive device   Problem: RH Toileting Goal: LTG Patient will perform toileting task (3/3 steps) with assistance level (OT) Description: LTG: Patient will perform toileting task (3/3 steps) with assistance level (OT)  Flowsheets (Taken 09/24/2023 1059) LTG: Pt will perform toileting task (3/3 steps) with assistance level: Independent with assistive device   Problem: RH Simple Meal Prep Goal: LTG Patient will perform simple meal prep w/assist (OT) Description: LTG: Patient will perform simple meal prep with assistance, with/without cues (OT). Flowsheets (Taken 09/24/2023 1059) LTG: Pt will perform simple meal prep with assistance level of: Independent with assistive device   Problem: RH Toilet Transfers Goal: LTG Patient will perform toilet transfers w/assist (OT) Description: LTG: Patient will perform toilet transfers with assist, with/without cues using equipment (OT) Flowsheets (Taken 09/24/2023 1059) LTG: Pt will perform toilet  transfers with assistance level of: Independent with assistive device   Problem: RH Tub/Shower Transfers Goal: LTG Patient will perform tub/shower transfers w/assist (OT) Description: LTG: Patient will perform tub/shower transfers with assist, with/without cues using equipment (OT) Flowsheets (Taken 09/24/2023 1059) LTG: Pt will perform tub/shower stall transfers with assistance level of: Independent with assistive device   Problem: RH Memory Goal: LTG Patient will demonstrate ability for day to day recall/carry over during activities of daily living with assistance level (OT) Description: LTG:  Patient will demonstrate ability for day to day recall/carry over during activities of daily living with assistance level (OT). Flowsheets (Taken 09/24/2023 1059) LTG:  Patient will demonstrate ability for day to day recall/carry over during activities of daily living with assistance level (OT): Modified Independent

## 2023-09-24 NOTE — Progress Notes (Signed)
 Inpatient Rehabilitation  Patient information reviewed and entered into eRehab system by Feliberto Gottron, M.A., CCC-SLP, Rehab Quality Coordinator.  Information including medical coding, functional ability and quality indicators will be reviewed and updated through discharge.

## 2023-09-24 NOTE — Patient Care Conference (Addendum)
 Inpatient RehabilitationTeam Conference and Plan of Care Update Date: 09/24/2023   Time: 1006 am    Patient Name: Brandon Flores      Medical Record Number: [id number]  Date of Birth: [birth year] Sex: Male         Room/Bed: 4W08C/4W08C-01 Payor Info: Payor: MEDICARE / Plan: MEDICARE PART A AND B / Product Type: *No Product type* /    Admit Date/Time:  09/23/2023  5:05 PM  Primary Diagnosis:  Debility  Hospital Problems: Principal Problem:   Debility    Expected Discharge Date: Expected Discharge Date:  (eval pending)  Team Members Present: Physician leading conference: Dr. Joesph Likes Social Worker Present: Graeme Jude, LCSW Nurse Present: Eulalio Falls, RN PT Present: Kirt Dawn, PT OT Present: Nereida Habermann, OT SLP Present: Recardo Mole, SLP PPS Coordinator present : Eleanor Colon, SLP     Current Status/Progress Goal Weekly Team Focus  Bowel/Bladder   Continent of bowel and bladder. LBM 08/18/22. voiding clear amber urine   Pt will maintain bowel and bladder funtion.   Encourage frequent toileting, Laxative given to enhance bowel movement by day shift    Swallow/Nutrition/ Hydration   regular/thin - eval pending           ADL's   Supervision-CGA for full-body care.   Mod I   General conditioning, activity tolerance, and higher-level balance.    Mobility   Eval pending           Communication   eval pending            Safety/Cognition/ Behavioral Observations  eval pending            Pain   denies ain or discomfort   will remain pain free during hospital stay   Every huft pain assessment and as needed    Skin   skin inatct   Skin will remain intact during hospital stay  every shift skin assessment      Discharge Planning:  TBA. Per EMR, pt wil discharge to home with his wife. SW will confirm there are no barriers to discharge.    Team Discussion: Patient was admitted post debility after urosepsis. Patient  has chronic  vertigo, wheezing, gait disorder w/ falls and urgency.  Patient on target to meet rehab goals:  Evals pending  *See Care Plan and progress notes for long and short-term goals.   Revisions to Treatment Plan:  Incentive Spirometer Pulmonary hygiene Vestibular Tx Timed toileting  Teaching Needs:  Safety, medications, transfers, toileting,dietary modifications,etc   Current Barriers to Discharge: Decreased caregiver support and Home enviroment access/layout  Possible Resolutions to Barriers: Family Education      Medical Summary Current Status: Medically complicated by urosepsis, vertebrobasilar insufficiency, chronic vertigo, AKI, hypomagnesemia, and constipation  Barriers to Discharge: Electrolyte abnormality;Medical stability;Infection/IV Antibiotics;Self-care education   Possible Resolutions to [employer] Focus: Monitor labs and vitals for ongoing infection, maintain blood pressure due to vertebrobasilar insufficiency, monitor bowel movements and titrate bowel regimen   Continued Need for Acute Rehabilitation Level of Care: The patient requires daily medical management by a physician with specialized training in physical medicine and rehabilitation for the following reasons: Direction of a multidisciplinary physical rehabilitation program to maximize functional independence : Yes Medical management of patient stability for increased activity during participation in an intensive rehabilitation regime.: Yes Analysis of laboratory values and/or radiology reports with any subsequent need for medication adjustment and/or medical intervention. : Yes   I attest that I was present, lead the  team conference, and concur with the assessment and plan of the team.   Melo Stauber Gayo 09/24/2023, 1006 am

## 2023-09-24 NOTE — Progress Notes (Signed)
 Met with patient to review current situation, team conference and plan of care. Patient claims he has a history of dizziness, urgency due to prostate Ca and has been falling at home. Patient had a recent fall during this admission. Reviewed dietary modifications, calling for help and not to get up by himself. Continue to follow along to provide educational needs to facilitate preparation for discharge.

## 2023-09-24 NOTE — Evaluation (Signed)
 Speech Language Pathology Assessment and Plan  Patient Details  Name: Brandon Flores MRN: [id number] Date of Birth: 1945-11-06  SLP Diagnosis: Other (comment) (n/a)  Rehab Potential:  n/a ELOS: n/a - skilled ST not warranted    Today's Date: 09/24/2023 SLP Individual Time: 1100-1200 SLP Individual Time Calculation (min): 60 min   Hospital Problem: Principal Problem:   Debility  Past Medical History:  Past Medical History:  Diagnosis Date   Cervical radiculopathy    Colon polyp    (2006- adenoma and tubulovillous adenoma; 2010-adenomas; repeat 201)-Dr. Vicci   Complex partial seizure disorder (HCC) 10/08/2014   COVID-19 2021   GERD (gastroesophageal reflux disease)    Gout    Hypercholesterolemia    Lateral epicondylitis    2002   Migraine    headaches   Ophthalmalgia    Dr. Roz   Orthodontics    Dr. Hughie   Partial epilepsy with impairment of consciousness (HCC)    Plantar fasciitis    Prostate cancer (HCC)    Seizure disorder (HCC)    Dr. Cleotilde ([facility])   Seizures [facility])    Traumatic incomplete tear of left rotator cuff 2024   UTI (urinary tract infection)    Vertebrobasilar insufficiency    Vertigo    Past Surgical History:  Past Surgical History:  Procedure Laterality Date   BACK SURGERY     HEMORROIDECTOMY     NECK SURGERY     PROSTATE BIOPSY      Assessment / Plan / Recommendation Clinical Impression Pt is a 78 year old male with history of left temporal hemorrhage with encephalomalacia, petit mal seizure disorder, BPPV, VB insuffiencey, gait disorder, prostate CA s/p XRT, frequency w/urge incontinence who was admitted on 09/19/23 with dizziness and fall with inability to get up. He reported significant weakness and slurred speech during this episode and presented to ED where he was found to be febrile with T-102, reported abominal pain and evidence of UTI. He was started on ceftriaxone  and blood cultures done positive for E coli and Enterobacterials  w/sensitivities  pending. Leucocytosis with low grade fevers and thrombocytopenia have resolved. Stress induced hyperglycemia noted with Hgb A1c- 5.0. MRI brain negative for acute changes and showed sequela of remote hemorrhage anterior left temporal lobe.    CTA head heck was negative for LVO and showed chronic R-VA occlusion or poor flow, moderate stenosis proximal BA, high grade stenosis R-ICA 65-70%, severe stenosis L-ACA origin, 50% stenosis L-CCA and L-ICA bulb and up to moderate stenosis distal left PCA, R-ACA A2 and L-MCA M1 segment. CT abdomen  pelvis done revealing bladder wall thickening with pericystic edema suspicious for cystitis and hyperattenuation in dependent bladder favored to be early contrast secretion rather than new bladder stone. 2D echo showed EF 60-65% with mild LVH. ST evaluation done revealing intact language and SLUMS 18/30 but wife and patient declined tx. Therapy evaluations completed revealing balance deficits with increase in vertiginous symptoms and requires min assist for mobility and CGA for ADLs. He was independent PTA and CIR recommended due to functional decline.   Pt completed the COGNISTAT and functional eval tasks. Overall, cognition WFL. He was extremely verbose and required frequent redirection back to task, but this was d/t pt personality vs true deficits. Mild STM deficits noted during COGNISTAT, however, pt reported memory deficits at baseline. He was able to recall details from OT eval this morning and recent medical events. Expressive/receptive language intact and speech remained 100% intelligible throughout. Also observed  w/ consecutive sips of thin liquids and no overt s/s of airway invasion were noted. Skilled ST not warranted at this time. He demonstrates adequate cognitive-linguistic skills to return to prev roles/responsibilities.      Skilled Therapeutic Interventions          SLP facilitated a cognitive-linguistic evaluation and brief bedside swallow  screen to assess pt's cognitive-communication skills and determine need for additional skilled ST services. See above for more information.    SLP Assessment  Patient does not need any further Speech Lanaguage Pathology Services    Recommendations  Recommendations for Other Services: Neuropsych consult Patient destination: Home Follow up Recommendations: None Equipment Recommended: None recommended by SLP    SLP Frequency  (n/a - skilled ST not warranted)   SLP Duration  SLP Intensity  SLP Treatment/Interventions n/a - skilled ST not warranted   (n/a - skilled ST not warranted)  Other (comment) (n/a - skilled ST not warranted)    Pain Pain Assessment Pain Scale: 0-10 Pain Score: 0-No pain  Prior Functioning Type of Home: House  Lives With: Spouse Available Help at Discharge: Family  SLP Evaluation Cognition Overall Cognitive Status: Within Functional Limits for tasks assessed Arousal/Alertness: Awake/alert Orientation Level: Oriented X4 Year: 2025 Month: June Day of Week: Correct Attention: Alternating Sustained Attention: Appears intact Memory: Impaired Memory Impairment: Decreased short term memory Decreased Short Term Memory: Verbal basic;Verbal complex Awareness: Appears intact Problem Solving: Appears intact Executive Function: Decision Making;Organizing;Reasoning Reasoning: Appears intact Organizing: Appears intact Decision Making: Appears intact Safety/Judgment: Appears intact  Comprehension Auditory Comprehension Overall Auditory Comprehension: Appears within functional limits for tasks assessed Expression Expression Primary Mode of Expression: Verbal Verbal Expression Overall Verbal Expression: Appears within functional limits for tasks assessed Written Expression Dominant Hand: Right Oral Motor Oral Motor/Sensory Function Overall Oral Motor/Sensory Function: Within functional limits Motor Speech Overall Motor Speech: Appears within  functional limits for tasks assessed  Care Tool Care Tool Cognition Ability to hear (with hearing aid or hearing appliances if normally used Ability to hear (with hearing aid or hearing appliances if normally used): 0. Adequate - no difficulty in normal conservation, social interaction, listening to TV   Expression of Ideas and Wants Expression of Ideas and Wants: 4. Without difficulty (complex and basic) - expresses complex messages without difficulty and with speech that is clear and easy to understand   Understanding Verbal and Non-Verbal Content Understanding Verbal and Non-Verbal Content: 4. Understands (complex and basic) - clear comprehension without cues or repetitions  Memory/Recall Ability Memory/Recall Ability : Current season;Location of own room;Staff names and faces;That he or she is in a hospital/hospital unit   Motor Speech Assessment  WFL   Short Term Goals: No short term goals set  Refer to Care Plan for Long Term Goals  Recommendations for other services: Neuropsych  Discharge Criteria: Patient will be discharged from SLP if patient refuses treatment 3 consecutive times without medical reason, if treatment goals not met, if there is a change in medical status, if patient makes no progress towards goals or if patient is discharged from hospital.  The above assessment, treatment plan, treatment alternatives and goals were discussed and mutually agreed upon: by patient  Recardo DELENA Mole 09/24/2023, 12:24 PM

## 2023-09-24 NOTE — Progress Notes (Signed)
 Inpatient Rehabilitation Admission Medication Review by a Pharmacist  A complete drug regimen review was completed for this patient to identify any potential clinically significant medication issues.  High Risk Drug Classes Is patient taking? Indication by Medication  Antipsychotic No   Anticoagulant Yes LMWH: VTE ppx  Antibiotic Yes Augmentin : UTI  Opioid No   Antiplatelet Yes Aspirin : stroke ppx  Hypoglycemics/insulin  No   Vasoactive Medication No   Chemotherapy No   Other Yes Allopurinol : gout Carbamazepine : seizures Dutasteride , Flomax : urge incontinence/frequency  Levothyroxine : hypothyroid Miralax , fleet, sorbitol, bisacodyl: bowel regimen Tylenol : pain Mylanta: indigestion Benadryl : itching Robitussin: cough Duoneb: wheezing/SOB Meclizine : dizziness Melatonin: sleep Compazine: N/V      Type of Medication Issue Identified Description of Issue Recommendation(s)  Drug Interaction(s) (clinically significant)     Duplicate Therapy     Allergy     No Medication Administration End Date     Incorrect Dose     Additional Drug Therapy Needed  Statin Consider starting Lipitor, patient had this in the past  Significant med changes from prior encounter (inform family/care partners about these prior to discharge).  Restart or discontinue as appropriate. Communicate medication changes with patient/family at discharge  Other       Clinically significant medication issues were identified that warrant physician communication and completion of prescribed/recommended actions by midnight of the next day:  No  Time spent performing this drug regimen review (minutes): 30  Thank you for allowing pharmacy to be a part of this patient's care.   Bascom JAYSON Louder, PharmD 09/24/2023 9:13 AM   **Pharmacist phone directory can be found on [email or web address] listed under [facility]**

## 2023-09-24 NOTE — Evaluation (Signed)
 Physical Therapy Assessment and Plan  Patient Details  Name: Brandon Flores MRN: [id number] Date of Birth: Jan 16, 1946  PT Diagnosis: Difficulty walking and Muscle weakness Rehab Potential: Good ELOS: 5-7 Days   Today's Date: 09/24/2023 PT Individual Time: 1300-1410 PT Individual Time Calculation (min): 70 min    Hospital Problem: Principal Problem:   Debility   Past Medical History:  Past Medical History:  Diagnosis Date   Cervical radiculopathy    Colon polyp    (2006- adenoma and tubulovillous adenoma; 2010-adenomas; repeat 201)-Dr. Vicci   Complex partial seizure disorder (HCC) 10/08/2014   COVID-19 2021   GERD (gastroesophageal reflux disease)    Gout    Hypercholesterolemia    Lateral epicondylitis    2002   Migraine    headaches   Ophthalmalgia    Dr. Roz   Orthodontics    Dr. Hughie   Partial epilepsy with impairment of consciousness (HCC)    Plantar fasciitis    Prostate cancer (HCC)    Seizure disorder (HCC)    Dr. Cleotilde ([facility])   Seizures [facility])    Traumatic incomplete tear of left rotator cuff 2024   UTI (urinary tract infection)    Vertebrobasilar insufficiency    Vertigo    Past Surgical History:  Past Surgical History:  Procedure Laterality Date   BACK SURGERY     HEMORROIDECTOMY     NECK SURGERY     PROSTATE BIOPSY      Assessment & Plan Clinical Impression: Patient is a 78 year old male with history of left temporal hemorrhage with encephalomalacia, petit mal seizure disorder, BPPV, VB insuffiencey, gait disorder, prostate CA s/p XRT, frequency w/urge incontinence who was admitted on 09/19/23 with dizziness and fall with inability to get up. He reported significant weakness and slurred speech during this episode and presented to ED where he was found to be febrile with T-102, reported abominal pain and evidence of UTI. He was started on ceftriaxone  and blood cultures done positive for E coli and Enterobacterials w/sensitivities  pending.  Leucocytosis with low grade fevers and thrombocytopenia have resolved. Stress induced hyperglycemia noted with Hgb A1c- 5.0. MRI brain negative for acute changes and showed sequela of remote hemorrhage anterior left temporal lobe.    CTA head heck was negative for LVO and showed chronic R-VA occlusion or poor flow, moderate stenosis proximal BA, high grade stenosis R-ICA 65-70%, severe stenosis L-ACA origin, 50% stenosis L-CCA and L-ICA bulb and up to moderate stenosis distal left PCA, R-ACA A2 and L-MCA M1 segment. CT abdomen  pelvis done revealing bladder wall thickening with pericystic edema suspicious for cystitis and hyperattenuation in dependent bladder favored to be early contrast secretion rather than new bladder stone. 2D echo showed EF 60-65% with mild LVH. ST evaluation done revealing intact language and SLUMS 18/30 but wife and patient declined tx. Therapy evaluations completed revealing balance deficits with increase in vertiginous symptoms and requires min assist for mobility and CGA for ADLs.  Patient transferred to CIR on 09/23/2023 .   Patient currently requires CGA with mobility secondary to muscle weakness, decreased cardiorespiratoy endurance, and decreased standing balance and decreased balance strategies.  Prior to hospitalization, patient was independent  with mobility and lived with Spouse in a House home.  Home access is 3 STE through front door; level entry throught the side.Stairs to enter.  Patient will benefit from skilled PT intervention to maximize safe functional mobility, minimize fall risk, and decrease caregiver burden for planned discharge home  with intermittent assist.  Anticipate patient will benefit from follow up OP at discharge.  PT - End of Session Activity Tolerance: Tolerates 30+ min activity with multiple rests Endurance Deficit: Yes PT Assessment Rehab Potential (ACUTE/IP ONLY): Good PT Patient demonstrates impairments in the following area(s):  Balance;Endurance;Motor;Safety PT Transfers Functional Problem(s): Bed Mobility;Bed to Chair;Furniture;Car PT Locomotion Functional Problem(s): Ambulation;Stairs PT Plan PT Intensity: Minimum of 1-2 x/day ,45 to 90 minutes PT Frequency: 5 out of 7 days PT Duration Estimated Length of Stay: 5-7 Days PT Treatment/Interventions: Ambulation/gait training;Community reintegration;DME/adaptive equipment instruction;Stair training;UE/LE Strength taining/ROM;Neuromuscular re-education;Psychosocial support;Balance/vestibular training;Discharge planning;Functional electrical stimulation;Pain management;Skin care/wound management;Therapeutic Activities;Cognitive remediation/compensation;Therapeutic Exercise;Patient/family education;Functional mobility training PT Transfers Anticipated Outcome(s): Supervision PT Locomotion Anticipated Outcome(s): Supervision PT Recommendation Recommendations for Other Services: None Follow Up Recommendations: Outpatient PT Patient destination: Home Equipment Recommended: To be determined   PT Evaluation Precautions/Restrictions Precautions Precautions: Fall Recall of Precautions/Restrictions: Intact Precaution/Restrictions Comments: History of vertigo Restrictions Weight Bearing Restrictions Per Provider Order: No General Chart Reviewed: Yes Family/Caregiver Present: No  Pain Interference Pain Interference Pain Effect on Sleep: 1. Rarely or not at all Pain Interference with Therapy Activities: 1. Rarely or not at all Pain Interference with Day-to-Day Activities: 1. Rarely or not at all Home Living/Prior Functioning Home Living Available Help at Discharge: Family Type of Home: House Home Access: Stairs to enter [employer] of Steps: 3 STE through front door; level entry throught the side. Entrance Stairs-Rails: Can reach both Home Layout: Two level;Bed/bath upstairs;1/2 bath on main level Alternate Level Stairs-Number of Steps: 14 Alternate  Level Stairs-Rails: Right Bathroom Shower/Tub: Tub/shower unit;Door (Reports having TTB and long-handled shower head, no grab bars.) Bathroom Toilet: Standard Bathroom Accessibility: Yes  Lives With: Spouse Prior Function Level of Independence: Independent with basic ADLs;Independent with homemaking with ambulation;Independent with gait;Independent with transfers (Use of hurrycane within community, furniture walks within home.) Driving: Yes Vision/Perception  Vision - History Ability to See in Adequate Light: 1 Impaired Vision - Assessment Convergence: Within functional limits Perception Perception: Within Functional Limits Praxis Praxis: WFL  Cognition Overall Cognitive Status: Within Functional Limits for tasks assessed Arousal/Alertness: Awake/alert Orientation Level: Oriented X4 Year: 2025 Month: June Day of Week: Correct Attention: Alternating Sustained Attention: Appears intact Memory: Impaired Memory Impairment: Decreased short term memory Decreased Short Term Memory: Verbal basic;Verbal complex Awareness: Appears intact Problem Solving: Appears intact Executive Function: Decision Making;Organizing;Reasoning Reasoning: Appears intact Organizing: Appears intact Decision Making: Appears intact Safety/Judgment: Appears intact Sensation Sensation Light Touch: Impaired Detail Peripheral sensation comments: B-foot numbness Light Touch Impaired Details: Impaired RLE;Impaired LLE Coordination Gross Motor Movements are Fluid and Coordinated: No Fine Motor Movements are Fluid and Coordinated: Yes Coordination and Movement Description: Deficits due to generalized weakness/debility. Motor  Motor Motor: Other (comment) Motor - Skilled Clinical Observations: Deficits due to generalized weakness/debility.  Trunk/Postural Assessment  Cervical Assessment Cervical Assessment: Within Functional Limits Thoracic Assessment Thoracic Assessment: Within Functional Limits Lumbar  Assessment Lumbar Assessment: Exceptions to [facility] (Posterior pelvic tilt) Postural Control Postural Control: Deficits on evaluation Righting Reactions: Decreased/delayed Protective Responses: Decreased/delayed  Balance Balance Balance Assessed: Yes Static Sitting Balance Static Sitting - Balance Support: Feet supported Static Sitting - Level of Assistance: 7: Independent Dynamic Sitting Balance Dynamic Sitting - Balance Support: During functional activity Dynamic Sitting - Level of Assistance: 6: Modified independent (Device/Increase time) Static Standing Balance Static Standing - Balance Support: During functional activity Static Standing - Level of Assistance: 5: Stand by assistance Dynamic Standing Balance Dynamic Standing - Balance Support: During functional  activity Dynamic Standing - Level of Assistance: 5: Stand by assistance Extremity Assessment  RUE Assessment RUE Assessment: Within Functional Limits LUE Assessment LUE Assessment: Within Functional Limits RLE Assessment RLE Assessment: Exceptions to [facility] General Strength Comments: Grossly 4/5 LLE Assessment LLE Assessment: Exceptions to [facility] General Strength Comments: Grossly 4/5  Care Tool Care Tool Bed Mobility Roll left and right activity   Roll left and right assist level: Supervision/Verbal cueing    Sit to lying activity   Sit to lying assist level: Supervision/Verbal cueing    Lying to sitting on side of bed activity   Lying to sitting on side of bed assist level: the ability to move from lying on the back to sitting on the side of the bed with no back support.: Supervision/Verbal cueing     Care Tool Transfers Sit to stand transfer   Sit to stand assist level: Contact Guard/Touching assist    Chair/bed transfer   Chair/bed transfer assist level: Contact Guard/Touching assist    Car transfer   Car transfer assist level: Contact Guard/Touching assist      Care Tool Locomotion Ambulation   Assist  level: Contact Guard/Touching assist Assistive device: No Device Max distance: 150'  Walk 10 feet activity   Assist level: Contact Guard/Touching assist Assistive device: No Device   Walk 50 feet with 2 turns activity   Assist level: Contact Guard/Touching assist Assistive device: No Device  Walk 150 feet activity   Assist level: Contact Guard/Touching assist Assistive device: No Device  Walk 10 feet on uneven surfaces activity   Assist level: Contact Guard/Touching assist    Stairs   Assist level: Contact Guard/Touching assist Stairs assistive device: 2 hand rails Max number of stairs: 12  Walk up/down 1 step activity   Walk up/down 1 step (curb) assist level: Contact Guard/Touching assist Walk up/down 1 step or curb assistive device: 2 hand rails  Walk up/down 4 steps activity   Walk up/down 4 steps assist level: Contact Guard/Touching assist Walk up/down 4 steps assistive device: 2 hand rails  Walk up/down 12 steps activity   Walk up/down 12 steps assist level: Contact Guard/Touching assist Walk up/down 12 steps assistive device: 2 hand rails  Pick up small objects from floor   Pick up small object from the floor assist level: Minimal Assistance - Patient > 75%    Wheelchair Is the patient using a wheelchair?: Yes Type of Wheelchair: Manual   Wheelchair assist level: Dependent - Patient 0% Max wheelchair distance: 150'  Wheel 50 feet with 2 turns activity   Assist Level: Dependent - Patient 0%  Wheel 150 feet activity   Assist Level: Dependent - Patient 0%    Refer to Care Plan for Long Term Goals  SHORT TERM GOAL WEEK 1 PT Short Term Goal 1 (Week 1): STGs = LTGs  Recommendations for other services: None   Skilled Therapeutic Intervention  Evaluation completed (see details above and below) with education on PT POC and goals and individual treatment initiated with focus on balance, transfers, ambulation, car transfer, and stair training. Pt received seated in WC  and agrees to therapy. No complaint of pain. WC transport to gym for time management. Pt completes sit to stand, ramp navigation, and car transfer with CGA and cues for sequencing and safety. Following rest break, pt ambulates x150' without AD, with CGA and cues for trunk rotation and upright gaze to improve posture and balance. Seate rest break. Pt completes x12 6 steps with bilateral hand  rails and CGA and cues for step sequencing and safety. PT explains 6 Minute Walk Tests and pt completes x16' without AD with CGA and same cues. Following, Pt completes 2x10 alternating foot taps on 4 step with CGA and cues for posture and lateral weight shifting. WC transport back to room. Left seated with all needs within reach.   Mobility Bed Mobility Bed Mobility: Supine to Sit;Sit to Supine Supine to Sit: Supervision/Verbal cueing Sit to Supine: Supervision/Verbal cueing Transfers Transfers: Sit to Stand;Stand to Sit;Stand Pivot Transfers Sit to Stand: Contact Guard/Touching assist Stand to Sit: Contact Guard/Touching assist Stand Pivot Transfers: Contact Guard/Touching assist Transfer (Assistive device): None Locomotion  Gait Ambulation: Yes Gait Assistance: Contact Guard/Touching assist Gait Distance (Feet): 1050 Feet Assistive device: None Gait Gait: Yes Gait Pattern: Impaired Gait Pattern: Wide base of support;Decreased stride length Gait velocity: reduced Stairs / Additional Locomotion Stairs: Yes Stairs Assistance: Contact Guard/Touching assist Stair Management Technique: Two rails Number of Stairs: 12 Height of Stairs: 6 Ramp: Contact Guard/touching assist Curb: Contact Guard/Touching assist Wheelchair Mobility Wheelchair Mobility: No   Discharge Criteria: Patient will be discharged from PT if patient refuses treatment 3 consecutive times without medical reason, if treatment goals not met, if there is a change in medical status, if patient makes no progress towards goals or if  patient is discharged from hospital.  The above assessment, treatment plan, treatment alternatives and goals were discussed and mutually agreed upon: by patient  Elsie JAYSON Dawn, PT, DPT 09/24/2023, 4:50 PM

## 2023-09-24 NOTE — Plan of Care (Signed)
  Problem: Consults Goal: RH GENERAL PATIENT EDUCATION Description: See Patient Education module for education specifics. Outcome: Progressing   Problem: RH BOWEL ELIMINATION Goal: RH STG MANAGE BOWEL WITH ASSISTANCE Description: STG Manage Bowel with mod I Assistance. Outcome: Progressing   Problem: RH BLADDER ELIMINATION Goal: RH STG MANAGE BLADDER WITH ASSISTANCE Description: STG Manage Bladder With mod I Assistance Outcome: Progressing   Problem: RH SKIN INTEGRITY Goal: RH STG SKIN FREE OF INFECTION/BREAKDOWN Description: Manage skin free of infection/breakdown with mod I assistance Outcome: Progressing   Problem: RH SAFETY Goal: RH STG ADHERE TO SAFETY PRECAUTIONS W/ASSISTANCE/DEVICE Description: STG Adhere to Safety Precautions With Mod I  Assistance/Device. Outcome: Progressing   Problem: RH PAIN MANAGEMENT Goal: RH STG PAIN MANAGED AT OR BELOW PT'S PAIN GOAL Description: <4 w/ prns Outcome: Progressing   Problem: RH KNOWLEDGE DEFICIT GENERAL Goal: RH STG INCREASE KNOWLEDGE OF SELF CARE AFTER HOSPITALIZATION Description: Manage increase knowledge of self care after hospitalization with mod I assistance from family using educational resources provided Outcome: Progressing

## 2023-09-25 DIAGNOSIS — R5381 Other malaise: Secondary | ICD-10-CM | POA: Diagnosis not present

## 2023-09-25 LAB — CULTURE, BLOOD (ROUTINE X 2): Special Requests: ADEQUATE

## 2023-09-25 MED ORDER — ASPIRIN 81 MG PO TBEC: 81.0000 mg | DELAYED_RELEASE_TABLET | Freq: Every day | ORAL | Status: AC

## 2023-09-25 MED ORDER — ACETAMINOPHEN 325 MG PO TABS: 325.0000 mg | ORAL_TABLET | ORAL | Status: AC | PRN

## 2023-09-25 MED ORDER — POLYETHYLENE GLYCOL 3350 17 G PO PACK: 17.0000 g | PACK | Freq: Every day | ORAL | Status: DC

## 2023-09-25 NOTE — Discharge Instructions (Addendum)
 Inpatient Rehab Discharge Instructions  Brandon Flores Discharge date and time: No discharge date for patient encounter.   Activities/Precautions/ Functional Status: Activity: activity as tolerated Diet: regular diet Wound Care: Routine skin checks Functional status:  ___ No restrictions     ___ Walk up steps independently ___ 24/7 supervision/assistance   ___ Walk up steps with assistance ___ Intermittent supervision/assistance  ___ Bathe/dress independently ___ Walk with walker     _x__ Bathe/dress with assistance ___ Walk Independently    ___ Shower independently ___ Walk with assistance    ___ Shower with assistance ___ No alcohol     ___ Return to work/school ________  Special Instructions:  No Driving smoking or alcohol  COMMUNITY REFERRALS UPON DISCHARGE:    Outpatient: PT      OT              Agency: [facility] Location        Phone: [phone]              Appointment Date/Time: *Please expect follow-up within 7-10 business days to schedule your appointment. If you have not received follow-up, be sure to contact the site directly.*      My questions have been answered and I understand these instructions. I will adhere to these goals and the provided educational materials after my discharge from the hospital.  Patient/Caregiver Signature _______________________________ Date __________  Clinician Signature _______________________________________ Date __________  Please bring this form and your medication list with you to all your follow-up doctor's appointments.

## 2023-09-25 NOTE — Progress Notes (Addendum)
 Patient ID: Brandon Flores, male   DOB: 01/08/1946, 78 y.o.   MRN: [id number]  [phone]- SW spoke with pt wife to introduce self, explain role, discuss discharge process and inform on short ELOS 5-10 days. Wife reports she has CHF and not able to do anything physically as unable to walk long distances. So not able to come in for family edu. Wife can still drive for medical appointments or will find a family member to help assist. SW will provide updates once available.   *SW received updates from team conference, d/c date, and outpatient therapy.   SW met with pt in room to inform on above change about d/c date. Preferred outpatient location- [facility] location. SW discussed with him discharge process. Aware SW will call his wife.   1345- SW called pt wife Brandon Flores to discuss above. She will have her dr call SW as she is unable to remember a few thins due to her age as she reports.   Brandon Flores, MSW, LCSW Office: [phone] Cell: [phone] Fax: [phone]

## 2023-09-25 NOTE — Progress Notes (Signed)
 Occupational Therapy Session Note  Patient Details  Name: Brandon Flores MRN: [id number] Date of Birth: [birth year]  Today's Date: 09/25/2023 OT Individual Time: 9047-8896 OT Individual Time Calculation (min): 71 min    Short Term Goals: Week 1:  OT Short Term Goal 1 (Week 1): STGs=LTGs due to patient's estimated length of stay.  Skilled Therapeutic Interventions/Progress Updates:    Pt received supine with no c/o pain, agreeable to OT session. Pt provided OT with lengthy hx, verbose and required redirection to session. He completed functional mobility to the therapy gym, 100 ft with no device with close (S). Pt completed blocked practice squats on Airex pad, 3x10 repetitions with no UE support, to challenge generalized strengthening for ADL transfers, as well as increasing functional activity tolerance and cardiorespiratory endurance. Pt required CGA overall. Pt required use of rest breaks throughout session for recovery, as well as to support safety and prevent overexertion. During breaks, OT monitored recovery time to assess endurance and response to exertion. He then completed 250 ft of functional mobility with added UE challenge of throwing/catching ball to add dynamic balance and attention component. He required CGA- (S) overall. He then completed 200 ft of functional mobility at (S) level with no device. He completed tub transfer, simulating his home environment with CGA. Discussed use of TTB, which he already owns. He completed another 200 ft of functional mobility to challenge cardiorespiratory endurance and dynamic balance as he fatigued. He returned to his room following and was left sitting up with all needs met.    Therapy Documentation Precautions:  Precautions Precautions: Fall Recall of Precautions/Restrictions: Intact Precaution/Restrictions Comments: History of vertigo Restrictions Weight Bearing Restrictions Per Provider Order: No   Therapy/Group: Individual  Therapy  Nena VEAR Moats 09/25/2023, 6:27 AM

## 2023-09-25 NOTE — Progress Notes (Signed)
 Physical Therapy Session Note  Patient Details  Name: Brandon Flores MRN: [id number] Date of Birth: 21-Nov-1945  Today's Date: 09/25/2023 PT Individual Time: 0804-0915 PT Individual Time Calculation (min): 71 min   Today's Date: 09/25/2023 PT Individual Time: 8547-8454 PT Individual Time Calculation (min): 53 min   Short Term Goals: Week 1:  PT Short Term Goal 1 (Week 1): STGs = LTGs  Skilled Therapeutic Interventions/Progress Updates:     1st Session: Pt received supine in bed and agrees to therapy. No complaint of pain and reports getting a good night's sleep. Bed mobility mod(I) with bed features. Pt stand and ambulates to toilet with cues for initiation. Following toileting, pt dons clean brief and shorts with setup assistance and then stands at sink to perform morning ADLs to work on standing balance an endurance. Pt ambulates to day room with cues for increasing stride length to decrease risk for falls. Pt practices sit to stand transfer with emphasis and anterior weight shifting. Pt noted to rock backward and utilize back of legs for balance. PT educates on importance of anterior weight shifting and demonstrates adequate weight shifting for balance. Pt performs repeated reps of sit to stand and able to complete ~50% of time without rocking backward. PT provides verbal cues for body mechanics and optimal sequencing for transfer. Pt then completes BERG balance test with score of 32/56, indicating high risk for falls. See flowsheets for details. Pt ambulates back to room, x150', with cues for upright posture and increasing stride length, as well as increasing internal rotation of hips for improved gait mechanics. Pt left seated in WC with all needs within reach.   2nd Session: Pt received seated in [facility] and agrees to therapy. No complaint of pain. Pt performs sit to stand with cues for initiation, then ambulates x150 to gym with cues for increased stride length and navigation. Pt completes TUG  test with following times: 10.3 seconds (pt does have notable cross over step during turn, with deceased safety). PT provides demonstration of utilizing upright gaze and allowing head turn to guide ambulatory turns, rather than head and body turning as one unit. Pt completes 2nd bout in 11.6 seconds but with much improved stability. 3rd bout completed in 12.0 seconds.   Pt ambulates across gym to biodex and steps up with cues for sequencing. PT participates in  weight distribution activity to provide visual representation of pt's center of gravity and balance tendencies. Pt noted to have ~70% weight posterior to center of gravity at initiation of activity. With cues for body mechanics and use of hip extension to shift weight anteriorly, pt is able to achieve 50% anterior-posterior WB for short periods of time. Pt progresses to competing without upper extremity support. For 5:00, pt able to have 50% WB for ~30% of time.   Pt ambulates x200' to orthogym and transfer onto Nustep. Pt completes x14:00 at workload of 6 with average steps per minute ~70. PT provides cues for hand and foot placement and completing full available ROM. Pt completes for endurance and strengthening. Pt ambulates back to room. Left seated with all needs within reach.    Therapy Documentation Precautions:  Precautions Precautions: Fall Recall of Precautions/Restrictions: Intact Precaution/Restrictions Comments: History of vertigo Restrictions Weight Bearing Restrictions Per Provider Order: No   Therapy/Group: Individual Therapy  Elsie JAYSON Dawn, PT, DPT 09/25/2023, 5:02 PM

## 2023-09-25 NOTE — Progress Notes (Signed)
 PROGRESS NOTE   Subjective/Complaints:  No events overnight, no acute complaints, doing very well in therapies.  Sleeping well, eating well. Vitals are stable Continent, 2 times bowel movements yesterday afternoon.  ROS: Denies fevers, chills, N/V, abdominal pain, constipation, diarrhea, SOB, cough, chest pain, new weakness or paraesthesias.    Objective:   No results found. Recent Labs    09/23/23 0450 09/24/23 0921  WBC 4.7 6.3  HGB 12.2* 13.3  HCT 36.6* 39.6  PLT 166 208   Recent Labs    09/23/23 0450 09/24/23 0455  NA 141 142  K 3.7 4.1  CL 110 107  CO2 22 25  GLUCOSE 163* 119*  BUN 20 17  CREATININE 1.28* 1.03  CALCIUM 8.1* 8.3*    Intake/Output Summary (Last 24 hours) at 09/25/2023 0829 Last data filed at 09/25/2023 0310 Gross per 24 hour  Intake 236 ml  Output 300 ml  Net -64 ml        Physical Exam: Vital Signs Blood pressure 133/63, pulse 70, temperature 98.4 F (36.9 C), temperature source Oral, resp. rate 18, height 5' 9 (1.753 m), weight 107 kg, SpO2 95%. Constitutional: No apparent distress. Appropriate appearance for age.  Working in therapy gym. HENT: No JVD. Neck Supple. Trachea midline. Atraumatic, normocephalic. Eyes: PERRLA. EOMI. Visual fields grossly intact. -No nystagmus appreciated in EOMI  Cardiovascular: RRR, no murmurs/rub/gallops. No Edema.   Respiratory: CTAB. No rales, rhonchi, or wheezing. On RA.  Abdomen: + bowel sounds, normoactive. No distention or tenderness.  Skin: C/D/I. No apparent lesions.  Peripheral IV intact.  MSK:      No apparent deformity.  Full active range of motion all 4 extremities  Neurologic exam:  Alert and oriented x 3.  Normal insight and awareness. Intact Memory. Normal language and speech.  Cranial nerve exam unremarkable.  MMT: 5- out of 5 in all 4 extremities throughout Sensory exam notable for stocking glove sensory loss in the feet.   No limb ataxia or cerebellar signs.  No abnormal tone appreciated.    Physical exam unchanged from the above on reexamination 09/25/23     Assessment/Plan: 1. Functional deficits which require 3+ hours per day of interdisciplinary therapy in a comprehensive inpatient rehab setting. Physiatrist is providing close team supervision and 24 hour management of active medical problems listed below. Physiatrist and rehab team continue to assess barriers to discharge/monitor patient progress toward functional and medical goals  Care Tool:  Bathing    Body parts bathed by patient: Right arm, Left arm, Chest, Abdomen, Front perineal area, Buttocks, Right upper leg, Left upper leg, Right lower leg, Left lower leg, Face         Bathing assist Assist Level: Contact Guard/Touching assist     Upper Body Dressing/Undressing Upper body dressing   What is the patient wearing?: Pull over shirt    Upper body assist Assist Level: Set up assist    Lower Body Dressing/Undressing Lower body dressing      What is the patient wearing?: Underwear/pull up, Pants     Lower body assist Assist for lower body dressing: Contact Guard/Touching assist     Toileting Toileting    Toileting  assist Assist for toileting: Contact Guard/Touching assist     Transfers Chair/bed transfer  Transfers assist     Chair/bed transfer assist level: Contact Guard/Touching assist     Locomotion Ambulation   Ambulation assist      Assist level: Contact Guard/Touching assist Assistive device: No Device Max distance: 150'   Walk 10 feet activity   Assist     Assist level: Contact Guard/Touching assist Assistive device: No Device   Walk 50 feet activity   Assist    Assist level: Contact Guard/Touching assist Assistive device: No Device    Walk 150 feet activity   Assist    Assist level: Contact Guard/Touching assist Assistive device: No Device    Walk 10 feet on uneven surface   activity   Assist     Assist level: Contact Guard/Touching assist     Wheelchair     Assist Is the patient using a wheelchair?: Yes Type of Wheelchair: Manual    Wheelchair assist level: Dependent - Patient 0% Max wheelchair distance: 150'    Wheelchair 50 feet with 2 turns activity    Assist        Assist Level: Dependent - Patient 0%   Wheelchair 150 feet activity     Assist      Assist Level: Dependent - Patient 0%   Blood pressure 133/63, pulse 70, temperature 98.4 F (36.9 C), temperature source Oral, resp. rate 18, height 5' 9 (1.753 m), weight 107 kg, SpO2 95%.  Medical Problem List and Plan: 1. Functional deficits secondary to debility after urosepsis with exacerbation of baseline vertebral-basilar insufficiency              -hx of left temporal hemorrhage             -patient may shower             -ELOS/Goals: 7-10 days, mod I goals--DC 6/28 -Stable to continue inpatient rehab 6/25: Doing very well in therapies, anticipated to meet mod I goals by the end of this week.  2.  Antithrombotics: -DVT/anticoagulation:  Pharmaceutical: Lovenox             -antiplatelet therapy: Was not on ASA PTA--will decrease ASA to 81 mg due to hx of temporal bleed.   - 6/25: Patient ambulating greater than 200 feet x 2 with therapy session; DC Lovenox  3. Pain Management: Tylenol  prn.  No pain.  4. Mood/Behavior/Sleep: LCSW to follow for evaluation and support.              -antipsychotic agents: N/A 5. Neuropsych/cognition: This patient is capable of making decisions on his own behalf. 6. Skin/Wound Care: Routine pressure relief measures.  7. Fluids/Electrolytes/Nutrition: Monitor I/O. Check CMET in am. 10.  Sepsis secondary to E. Coli bacteremia -pt received 5 days of ceftriaxone  -begin augmentin  for 5 additional days thru 6/28  --Cystitis likely due to UTI,   11.  Wheezing: Does have elevated right diaphragm with low volumes. Hx of 3 PPD smoking in  the past --Will monitor respiratory status with increase in activity.              --Encourage pulmonary hygiene. Will add incentive spirometry.   - 6/24: No wheezing appreciated on exam  12.  H/o VB insufficency w/Left temporal encephalomalacia: Acute on chronic vertigo.  --Continue vestibular tx. Meclizine  prn as at home.   15.  Gait disorder w/falls: Likely multifactorial. Will also order orthostatic vitals to rule out as cause.  --Vestibular  Tx, awareness of deficits as well as safety.  - 6/25: Doing very well with stability and awareness; continue with therapies  11.  Acute renal failure: SCr up to 1.3 since last check 03/13 likely due to cystitis, mobic and fall.  --Will monitor voiding to rule out retention as cause.  -6/24: Creatinine improved to 1.03; BUN within normal limits.  Monitor fluid intakes, regular labs.  12.  H/o of partial complex seizures: On Tegretol  400 mg TID per  06/2023 visit w/Dr. Gregg. Random level elevated (after am dose?)             --seizures consist of uncontrollable movement of hands mainly rubbing abdomen.              --will check tegretol  level in am --11.0, within normal limits  13. H/o of prostate cancer: Has urge incontinence/frequency. Continue dutasteride  and tamsulosin .             --toilet patient every 2-3 hours. Monitor voiding with PVR checks. - 6/24: No PVRs, has been voiding continent.  Will remind nursing. 6/25: PVRs low; DC  14. Hypomagnesemia: Was 1.6 @ admission and improved 2.0.  --will recheck level in am.  -6-24: Level 1.8, within normal limits.  15. Advanced Cervical DDD/ Lumbar spondylolisthesis:  Recent Rx- percocet 5 on 06/16 #30 per Dr. Hughie.              --Was also on Mobic and will not resume at this time.   6/25: Pain well-controlled with conservative measures; will not resume narcotics or NSAIDs, continue with as needed Tylenol   16. Constipation: Will add daily miralax . Sorbitol today if no results.    LBM 6/20 -  will give sorbitol today 6/24-2x bowel movement, no amount recorded    LOS: 2 days A FACE TO FACE EVALUATION WAS PERFORMED  Joesph JAYSON Likes 09/25/2023, 8:29 AM

## 2023-09-25 NOTE — Progress Notes (Addendum)
 Inpatient Rehabilitation Care Coordinator Assessment and Plan Patient Details  Name: Brandon Flores MRN: [id number] Date of Birth: 01-21-1946  Today's Date: 09/25/2023  Hospital Problems: Principal Problem:   Debility  Past Medical History:  Past Medical History:  Diagnosis Date   Cervical radiculopathy    Colon polyp    (2006- adenoma and tubulovillous adenoma; 2010-adenomas; repeat 201)-Dr. Vicci   Complex partial seizure disorder (HCC) 10/08/2014   COVID-19 2021   GERD (gastroesophageal reflux disease)    Gout    Hypercholesterolemia    Lateral epicondylitis    2002   Migraine    headaches   Ophthalmalgia    Dr. Roz   Orthodontics    Dr. Hughie   Partial epilepsy with impairment of consciousness (HCC)    Plantar fasciitis    Prostate cancer (HCC)    Seizure disorder (HCC)    Dr. Cleotilde ([facility])   Seizures [facility])    Traumatic incomplete tear of left rotator cuff 2024   UTI (urinary tract infection)    Vertebrobasilar insufficiency    Vertigo    Past Surgical History:  Past Surgical History:  Procedure Laterality Date   BACK SURGERY     HEMORROIDECTOMY     NECK SURGERY     PROSTATE BIOPSY     Social History:  reports that he quit smoking about 37 years ago. His smoking use included cigarettes. He started smoking about 57 years ago. He has a 60 pack-year smoking history. He has never used smokeless tobacco. He reports that he does not drink alcohol and does not use drugs.  Family / Support Systems Marital Status: Married How Long?: 36 years Patient Roles: Spouse, Parent Spouse/Significant Other: Brandon Flores (wife) [phone] Children: Blended fami;y. He has two children but is estranged. Wife has 4 childrenand 15 grandchildren. Other Supports: Various childrne/grandchildren Anticipated Caregiver: N/A Ability/Limitations of Caregiver: Wife can only provide supervision due to CHF. She repots she can assit with transportation but if not, they can find family to  transport. 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 Education: some Charity fundraiser - How often do you need to have someone help you when you read instructions, pamphlets, or other written material from your doctor or pharmacy?: Never Writes: Yes Employment Status: Retired Date Retired/Disabled/Unemployed: 2020 Age Retired: 73 Marine scientist Issues: Denies Guardian/Conservator: N/A   Abuse/Neglect Abuse/Neglect Assessment Can Be Completed: Yes Physical Abuse: Denies Verbal Abuse: Denies Sexual Abuse: Denies Exploitation of patient/patient's resources: Denies Self-Neglect: Denies  Patient response to: Social Isolation - How often do you feel lonely or isolated from those around you?: Never  Emotional Status Pt's affect, behavior and adjustment status: Pt in good spirits at time of visit Recent Psychosocial Issues: Denie Psychiatric History: Denies Substance Abuse History: Denies  Patient / Family Perceptions, Expectations & Goals Pt/Family understanding of illness & functional limitations: Pt and family have a general understanding of care needs Premorbid pt/family roles/activities: Independent Anticipated changes in roles/activities/participation: likley Mod I and transportation to appointments Pt/family expectations/goals: Pt goal was to get back to where he was before he came into the hospital.  [employer]: None Premorbid Home Care/DME Agencies: None Transportation available at discharge: TBD Is the patient able to respond to transportation needs?: Yes In the past 12 months, has  lack of transportation kept you from medical appointments or from getting medications?: No In the past 12  months, has lack of transportation kept you from meetings, work, or from getting things needed for daily living?: No Resource referrals recommended: Neuropsychology  Discharge Planning Living Arrangements: Spouse/significant other Support Systems: Spouse/significant other Type of Residence: Private residence Insurance Resources: Electrical engineer Resources: Restaurant manager, fast food Screen Referred: No Living Expenses: Banker Management: Spouse Does the patient have any problems obtaining your medications?: No Home Management: Pt wife manages all homecare needs Patient/Family Preliminary Plans: TBD Care Coordinator Barriers to Discharge: Decreased caregiver support, Lack of/limited family support Care Coordinator Anticipated Follow Up Needs: HH/OP Expected length of stay: D/c 6/28  Clinical Impression Pt is pleasant. Pt has support from various family members. Wife has CHF so only can provide supervision. Other family can help assist with transportation to appointments. DME- 30 canes (vary from [facility], hurry cane, and quad canes); access to TTB.  [name] 09/25/2023, 2:38 PM

## 2023-09-25 NOTE — Care Management (Signed)
 Inpatient Rehabilitation Center Individual Statement of Services  Patient Name:  Brandon Flores  Date:  09/25/2023  Welcome to the Inpatient Rehabilitation Center.  Our goal is to provide you with an individualized program based on your diagnosis and situation, designed to meet your specific needs.  With this comprehensive rehabilitation program, you will be expected to participate in at least 3 hours of rehabilitation therapies Monday-Friday, with modified therapy programming on the weekends.  Your rehabilitation program will include the following services:  Physical Therapy (PT), Occupational Therapy (OT), Speech Therapy (ST), 24 hour per day rehabilitation nursing, Therapeutic Recreaction (TR), Psychology, Neuropsychology, Care Coordinator, Rehabilitation Medicine, Nutrition Services, Pharmacy Services, and Other  Weekly team conferences will be held on Tuesdays  to discuss your progress.  Your Inpatient Rehabilitation Care Coordinator will talk with you frequently to get your input and to update you on team discussions.  Team conferences with you and your family in attendance may also be held.  Expected length of stay: 5-10 days    Overall anticipated outcome: Independent with Assistive Device  Depending on your progress and recovery, your program may change. Your Inpatient Rehabilitation Care Coordinator will coordinate services and will keep you informed of any changes. Your Inpatient Rehabilitation Care Coordinator's name and contact numbers are listed  below.  The following services may also be recommended but are not provided by the Inpatient Rehabilitation Center:  Driving Evaluations Home Health Rehabiltiation Services Outpatient Rehabilitation Services Vocational Rehabilitation   Arrangements will be made to provide these services after discharge if needed.  Arrangements include referral to agencies that provide these services.  Your insurance has been verified to be:  Medicare  A/B  Your primary doctor is:  Ozell Hilts  Pertinent information will be shared with your doctor and your insurance company.  Inpatient Rehabilitation Care Coordinator:  Graeme Feliciana SILK [phone] or ([phone]  Information discussed with and copy given to patient by: Graeme DELENA Feliciana, 09/25/2023, 9:50 AM

## 2023-09-26 ENCOUNTER — Other Ambulatory Visit (HOSPITAL_COMMUNITY): Payer: Self-pay

## 2023-09-26 LAB — BASIC METABOLIC PANEL WITH GFR
Anion gap: 4 — ABNORMAL LOW (ref 5–15)
BUN: 17 mg/dL (ref 8–23)
CO2: 29 mmol/L (ref 22–32)
Calcium: 8.2 mg/dL — ABNORMAL LOW (ref 8.9–10.3)
Chloride: 106 mmol/L (ref 98–111)
Creatinine, Ser: 1.07 mg/dL (ref 0.61–1.24)
GFR, Estimated: >60 mL/min (ref >60)
Glucose, Bld: 121 mg/dL — ABNORMAL HIGH (ref 70–99)
Potassium: 3.8 mmol/L (ref 3.5–5.1)
Sodium: 139 mmol/L (ref 135–145)

## 2023-09-26 MED ORDER — AMOXICILLIN-POT CLAVULANATE 875-125 MG PO TABS
1.0000 | ORAL_TABLET | Freq: Two times a day (BID) | ORAL | 0 refills | Status: DC
  Filled 2023-09-26: qty 2, 1d supply, fill #0

## 2023-09-26 NOTE — Progress Notes (Signed)
 Occupational Therapy Session Note  Patient Details  Name: Brandon Flores MRN: [id number] Date of Birth: 06-22-1945  Today's Date: 09/26/2023 OT Individual Time: [id number] ( 87 mins) OT Individual Time Calculation (min): 71 min    Short Term Goals: Week 1:  OT Short Term Goal 1 (Week 1): STGs=LTGs due to patient's estimated length of stay.  Skilled Therapeutic Interventions/Progress Updates:  Session 1: Pt greeted supine in bed, pt agreeable to OT intervention.      Transfers/bed mobility/functional mobility: pt completed functional ambulation in room  and in hallway with no AD and supervision. MODI for bed mobility.    ADLs:  Grooming: pt stood at sink for grooming tasks with supervision UB dressing: pt donned [place] shirt with supervision  LB dressing: pt donned pants from EOB with supervision  Footwear: pt donned slide on shoes with supervision from standing   Education: full education provided on energy conservation and planning and prioritizing through ADLs/IADLS.    Exercises: pt completed standing reactive balance task with pt completing various catch/release tasks in standing to rebounder. Pt instructed to complete task for 1 min and then rest for 30 secs to challenge cardiovascular endurance to improve overall global endurance.   HR 101-99 bpm post activity   Pt completed functional endurance task with pt instructed to navigate 8 stairs and transport horseshoes from one end to the other to challenge balance and activity tolerance. Pt completed task with CGA in 4 trials.   HR 91- SpO2 97% post activity   Pt completed alternating toe taps on 3inch step with BUE support for 1 min with 30 sec rest break. Pt then completed step ups to 3 inch step with BUE support for with additional 30 sec rest break. Lastly, pt completed 1 min of step ups to 3 inch step with BUE support 4lb ankle weights donned.   HR 88 SpO2 98%                 Pt completed dynamic balance task  with pt instructed to ambulate through // bars while completing toe taps on cones positioned laterally to challenge single leg stance for LB ADLS and to facilitate improved dynamic balance. Pt completed task with supervision, no LOB. Pt then completed lateral steps over cones to R and L side to simulate shower transfer with no UE support with CGA and no LOB.   Ended session with pt seated in w/c with all needs within reach and chair alarm activated.                       Session 2: Pt greeted seated in w/c, pt agreeable to OT intervention.      Transfers/bed mobility/functional mobility:  Pt completed all functional ambulation with no AD and supervision.    IADLS:pt completed IADL task of making dog treats in kitchen to challenge functional endurance and dynamic balance with pt having to reach [facility] and below knee level to retrieve needed ingredients. Pt instructed to follow recipe to assess problem solving novel information. Pt completed task with distant supervision. Pt able to stand for ~ 10 mins at a time before needing to sit. Pt needed 4 seated rest breaks total d/t fatigue and pain from low back. Pt reports this was an issue at baseline.                 Ended session with pt supine in bed with all needs within reach and bed alarm  activated.                    Therapy Documentation Precautions:  Precautions Precautions: Fall Recall of Precautions/Restrictions: Intact Precaution/Restrictions Comments: History of vertigo Restrictions Weight Bearing Restrictions Per Provider Order: No  Pain: No pain    Therapy/Group: Individual Therapy  Ronal Mallie Needy 09/26/2023, 12:12 PM

## 2023-09-26 NOTE — Progress Notes (Signed)
 Physical Therapy Session Note  Patient Details  Name: Brandon Flores MRN: [id number] Date of Birth: May 26, 1945  Today's Date: 09/26/2023 PT Individual Time: 8694-8584 PT Individual Time Calculation (min): 70 min   Short Term Goals: Week 1:  PT Short Term Goal 1 (Week 1): STGs = LTGs  Skilled Therapeutic Interventions/Progress Updates:     Pt received supine in bed and agrees to therapy. No complaint of pain. Supine to sit with bed features. Pt stands and ambulates to toilet with cues for initiation and importance of wearing shoes during ambulation for balance and skin integrity due to pt's lack of sensation on bottom of both feet. Following, pt ambulates x200' to gym with cues for navigation and maintaining upright gaze to improve posture and balance. Pt performs knee walking in high kneeling to challenge hip strength and core stability, ambulating x6' forward, then backward in quadruped. Upon returning to start position, pt transitions from quadruped to high kneeling to engage hip extensors and core muscles. Pt completes  3x4 cycles with extended seated rest breaks between bouts. Pt then completes 2x10 quadruped to/from high kneeling transitions to provide motor control challenge and core strengthening. PT provides verbal and tactile cues for correct performance and for NM feedback.   PT demonstrates push and release test for pt, having 2nd therapist lean back in standing while PT provides trunk support, then removes support without warning to challenge ability to react and perform large enough amplitude movement to step so base of support is able to prevent posterior fall. Pt then performs multiple times, initially with PT providing countdown prior to removing support, and progressing to removal of support without warning. Pt able to maintain balance with each attempt, but noted to require multiple steps. PT provides demonstration and cues of taking larger step and pt does a good job of  incorporating in subsequent attempts.   Pt ambulates back to room. Left semi reclined in bed with all needs within reach.    Therapy Documentation Precautions:  Precautions Precautions: Fall Recall of Precautions/Restrictions: Intact Precaution/Restrictions Comments: History of vertigo Restrictions Weight Bearing Restrictions Per Provider Order: No  Therapy/Group: Individual Therapy  Elsie JAYSON Dawn, PT, DPT 09/26/2023, 3:48 PM

## 2023-09-26 NOTE — IPOC Note (Signed)
 Overall Plan of Care [facility]) Patient Details Name: Brandon Flores MRN: [id number] DOB: 10/03/1945  Admitting Diagnosis: Debility  Hospital Problems: Principal Problem:   Debility     Functional Problem List: Nursing Bladder, Bowel, Edema, Endurance, Medication Management, Pain, Safety  PT Balance, Endurance, Motor, Safety  OT Balance, Endurance, Safety  SLP    TR         Basic ADL's: OT Bathing, Dressing, Toileting     Advanced  ADL's: OT Simple Meal Preparation     Transfers: PT Bed Mobility, Bed to Chair, Furniture, Customer service manager, Research scientist (life sciences): PT Ambulation, Stairs     Additional Impairments: OT    SLP None      TR      Anticipated Outcomes Item Anticipated Outcome  Self Feeding    Swallowing      Basic self-care  Mod I  Toileting  Mod I   Bathroom Transfers Mod I  Bowel/Bladder  manage bowels with medication/ manage bladder with timed toileting  Transfers  Supervision  Locomotion  Supervision  Communication     Cognition     Pain  <4 w/ prns  Safety/Judgment  manage safety with mod I assistance   Therapy Plan: PT Intensity: Minimum of 1-2 x/day ,45 to 90 minutes PT Frequency: 5 out of 7 days PT Duration Estimated Length of Stay: 5-7 Days OT Intensity: Minimum of 1-2 x/day, 45 to 90 minutes OT Frequency: 5 out of 7 days OT Duration/Estimated Length of Stay: 7-10 days SLP Intensity:  (n/a - skilled ST not warranted) SLP Frequency:  (n/a - skilled ST not warranted) SLP Duration/Estimated Length of Stay: n/a - skilled ST not warranted   Team Interventions: Nursing Interventions Patient/Family Education, Bowel Management, Disease Management/Prevention, Bladder Management, Pain Management, Medication Management, Discharge Planning  PT interventions Ambulation/gait training, Community reintegration, DME/adaptive equipment instruction, Stair training, UE/LE Strength taining/ROM, Neuromuscular re-education, Psychosocial support,  Warden/ranger, Discharge planning, Functional electrical stimulation, Pain management, Skin care/wound management, Therapeutic Activities, Cognitive remediation/compensation, Therapeutic Exercise, Patient/family education, Functional mobility training  OT Interventions Balance/vestibular training, Discharge planning, Disease mangement/prevention, DME/adaptive equipment instruction, Community reintegration, Cognitive remediation/compensation, Functional mobility training, Neuromuscular re-education, Pain management, Patient/family education, Psychosocial support, Self Care/advanced ADL retraining, Skin care/wound managment, Therapeutic Activities, Therapeutic Exercise, UE/LE Strength taining/ROM  SLP Interventions Other (comment) (n/a - skilled ST not warranted)  TR Interventions    SW/CM Interventions Psychosocial Support, Patient/Family Education, Discharge Planning   Barriers to Discharge MD  Medical stability, Home enviroment access/loayout, Lack of/limited family support, and Insurance for SNF coverage  Nursing Decreased caregiver support, Home environment access/layout, Incontinence Discharge: House  Discharge Home Layout: Two level, Bed/bath upstairs  Alternate Level Stairs-Rails: Right  Alternate Level Stairs-Number of Steps: 14  Discharge Home Access: Stairs to enter  Entrance Stairs-Rails: Can reach both  Entrance Stairs-Number of Steps: 4  PT      OT Decreased caregiver support    SLP Other (comments) n/a  SW Decreased caregiver support, Lack of/limited family support     Team Discharge Planning: Destination: PT-Home ,OT- Home , SLP-Home Projected Follow-up: PT-Outpatient PT, OT-  Outpatient OT, SLP-None Projected Equipment Needs: PT-To be determined, OT- To be determined, SLP-None recommended by SLP Equipment Details: PT- , OT-  Patient/family involved in discharge planning: PT- Patient,  OT-Patient, SLP-Patient  MD ELOS: 5-7 days Medical Rehab Prognosis:   Excellent Assessment: The patient has been admitted for CIR therapies with the diagnosis of debility. The team  will be addressing functional mobility, strength, stamina, balance, safety, adaptive techniques and equipment, self-care, bowel and bladder mgt, patient and caregiver education,. Goals have been set at Mod I. Anticipated discharge destination is home.       See Team Conference Notes for weekly updates to the plan of care

## 2023-09-26 NOTE — Progress Notes (Signed)
 Patient ID: Haitham Dolinsky, male   DOB: 04-14-1945, 78 y.o.   MRN: [id number]  1030-SW spoke with pt wife to follow-up about waiting on follow-up from her dtr. She said she forgot to tell her to call. She provided SW with number- Olam Riedel [phone]. SW ;eft message for pt dtr and waiting on follow-up.   25- SW called pt dtr Olam but waiting on a return phone call.   Graeme Jude, MSW, LCSW Office: [phone] Cell: [phone] Fax: [phone]

## 2023-09-26 NOTE — Progress Notes (Signed)
 PROGRESS NOTE   Subjective/Complaints:  No events overnight, no acute complaints.  Patient resting in bed on a.m. evaluation, per nursing no concerns overnight. Vitals stable BUN/Cr stable, BG slightly elevated.    ROS: Denies fevers, chills, N/V, abdominal pain, constipation, diarrhea, SOB, cough, chest pain, new weakness or paraesthesias.    Objective:   No results found. Recent Labs    09/24/23 0921  WBC 6.3  HGB 13.3  HCT 39.6  PLT 208   Recent Labs    09/24/23 0455 09/26/23 0510  NA 142 139  K 4.1 3.8  CL 107 106  CO2 25 29  GLUCOSE 119* 121*  BUN 17 17  CREATININE 1.03 1.07  CALCIUM 8.3* 8.2*    Intake/Output Summary (Last 24 hours) at 09/26/2023 1002 Last data filed at 09/26/2023 0817 Gross per 24 hour  Intake 236 ml  Output 250 ml  Net -14 ml        Physical Exam: Vital Signs Blood pressure (!) 126/98, pulse 70, temperature 98.1 F (36.7 C), temperature source Oral, resp. rate 18, height 5' 9 (1.753 m), weight 107 kg, SpO2 93%.   PE: Constitution: Appropriate appearance for age. No apparent distress +Obese Resp: No respiratory distress. No accessory muscle usage. on RA Cardio: Well perfused appearance. No peripheral edema. Abdomen: Nondistended. Nontender.   Psych: Appropriate mood and affect. Neuro: AAOx4. No apparent cognitive deficits   Neurologic Exam:   Sensory exam: Positive to light touch in all 4 extremities Motor exam: Moving all 4 limbs in bed     Physical exam unchanged from the above on reexamination 09/26/23     Assessment/Plan: 1. Functional deficits which require 3+ hours per day of interdisciplinary therapy in a comprehensive inpatient rehab setting. Physiatrist is providing close team supervision and 24 hour management of active medical problems listed below. Physiatrist and rehab team continue to assess barriers to discharge/monitor patient progress toward  functional and medical goals  Care Tool:  Bathing    Body parts bathed by patient: Right arm, Left arm, Chest, Abdomen, Front perineal area, Buttocks, Right upper leg, Left upper leg, Right lower leg, Left lower leg, Face         Bathing assist Assist Level: Contact Guard/Touching assist     Upper Body Dressing/Undressing Upper body dressing   What is the patient wearing?: Pull over shirt    Upper body assist Assist Level: Set up assist    Lower Body Dressing/Undressing Lower body dressing      What is the patient wearing?: Underwear/pull up, Pants     Lower body assist Assist for lower body dressing: Contact Guard/Touching assist     Toileting Toileting    Toileting assist Assist for toileting: Contact Guard/Touching assist     Transfers Chair/bed transfer  Transfers assist     Chair/bed transfer assist level: Contact Guard/Touching assist     Locomotion Ambulation   Ambulation assist      Assist level: Contact Guard/Touching assist Assistive device: No Device Max distance: 150'   Walk 10 feet activity   Assist     Assist level: Contact Guard/Touching assist Assistive device: No Device   Walk 50 feet activity  Assist    Assist level: Contact Guard/Touching assist Assistive device: No Device    Walk 150 feet activity   Assist    Assist level: Contact Guard/Touching assist Assistive device: No Device    Walk 10 feet on uneven surface  activity   Assist     Assist level: Contact Guard/Touching assist     Wheelchair     Assist Is the patient using a wheelchair?: Yes Type of Wheelchair: Manual    Wheelchair assist level: Dependent - Patient 0% Max wheelchair distance: 150'    Wheelchair 50 feet with 2 turns activity    Assist        Assist Level: Dependent - Patient 0%   Wheelchair 150 feet activity     Assist      Assist Level: Dependent - Patient 0%   Blood pressure (!) 126/98, pulse 70,  temperature 98.1 F (36.7 C), temperature source Oral, resp. rate 18, height 5' 9 (1.753 m), weight 107 kg, SpO2 93%.  Medical Problem List and Plan: 1. Functional deficits secondary to debility after urosepsis with exacerbation of baseline vertebral-basilar insufficiency              -hx of left temporal hemorrhage             -patient may shower             -ELOS/Goals: 7-10 days, mod I goals--DC 6/28 -Stable to continue inpatient rehab 6/25: Doing very well in therapies, anticipated to meet mod I goals by the end of this week.  2.  Antithrombotics: -DVT/anticoagulation:  Pharmaceutical: Lovenox             -antiplatelet therapy: Was not on ASA PTA--will decrease ASA to 81 mg due to hx of temporal bleed.   - 6/25: Patient ambulating greater than 200 feet x 2 with therapy session; DC Lovenox  3. Pain Management: Tylenol  prn.  No pain.  4. Mood/Behavior/Sleep: LCSW to follow for evaluation and support.              -antipsychotic agents: N/A 5. Neuropsych/cognition: This patient is capable of making decisions on his own behalf. 6. Skin/Wound Care: Routine pressure relief measures.  7. Fluids/Electrolytes/Nutrition: Monitor I/O. Check CMET in am. 10.  Sepsis secondary to E. Coli bacteremia -pt received 5 days of ceftriaxone  -begin augmentin  for 5 additional days thru 6/28  --Cystitis likely due to UTI,   11.  Wheezing: Does have elevated right diaphragm with low volumes. Hx of 3 PPD smoking in the past --Will monitor respiratory status with increase in activity.              --Encourage pulmonary hygiene. Will add incentive spirometry.   - 6/24: No wheezing appreciated on exam  12.  H/o VB insufficency w/Left temporal encephalomalacia: Acute on chronic vertigo.  --Continue vestibular tx. Meclizine  prn as at home.   15.  Gait disorder w/falls: Likely multifactorial. Will also order orthostatic vitals to rule out as cause.  --Vestibular Tx, awareness of deficits as well as safety.   - 6/25: Doing very well with stability and awareness; continue with therapies  11.  Acute renal failure: SCr up to 1.3 since last check 03/13 likely due to cystitis, mobic and fall.  --Will monitor voiding to rule out retention as cause.  -6/24: Creatinine improved to 1.03; BUN within normal limits.  Monitor fluid intakes, regular labs.  12.  H/o of partial complex seizures: On Tegretol  400 mg TID per  06/2023  visit w/Dr. Gregg. Random level elevated (after am dose?)             --seizures consist of uncontrollable movement of hands mainly rubbing abdomen.              --will check tegretol  level in am --11.0, within normal limits  13. H/o of prostate cancer: Has urge incontinence/frequency. Continue dutasteride  and tamsulosin .             --toilet patient every 2-3 hours. Monitor voiding with PVR checks. - 6/24: No PVRs, has been voiding continent.  Will remind nursing. 6/25: PVRs low; DC  14. Hypomagnesemia: Was 1.6 @ admission and improved 2.0.  --will recheck level in am.  -6-24: Level 1.8, within normal limits.  15. Advanced Cervical DDD/ Lumbar spondylolisthesis:  Recent Rx- percocet 5 on 06/16 #30 per Dr. Hughie.              --Was also on Mobic and will not resume at this time.   6/25: Pain well-controlled with conservative measures; will not resume narcotics or NSAIDs, continue with as needed Tylenol   16. Constipation: Will add daily miralax . Sorbitol today if no results.    LBM 6/20 - will give sorbitol today 6/24-2x bowel movement, no amount recorded    LOS: 3 days A FACE TO FACE EVALUATION WAS PERFORMED  Brandon Flores 09/26/2023, 10:02 AM

## 2023-09-27 ENCOUNTER — Other Ambulatory Visit (HOSPITAL_COMMUNITY): Payer: Self-pay

## 2023-09-27 MED ORDER — AMLODIPINE BESYLATE 2.5 MG PO TABS
2.5000 mg | ORAL_TABLET | Freq: Every day | ORAL | Status: DC
  Administered 2023-09-27 – 2023-09-28 (×2): 2.5 mg via ORAL
  Filled 2023-09-27 (×2): qty 1

## 2023-09-27 MED ORDER — AMLODIPINE BESYLATE 2.5 MG PO TABS
2.5000 mg | ORAL_TABLET | Freq: Every day | ORAL | 0 refills | Status: AC
  Filled 2023-09-27: qty 30, 30d supply, fill #0

## 2023-09-27 NOTE — Plan of Care (Signed)
  Problem: Consults Goal: RH GENERAL PATIENT EDUCATION Description: See Patient Education module for education specifics. Outcome: Progressing   Problem: RH BOWEL ELIMINATION Goal: RH STG MANAGE BOWEL WITH ASSISTANCE Description: STG Manage Bowel with mod I Assistance. Outcome: Progressing   Problem: RH BLADDER ELIMINATION Goal: RH STG MANAGE BLADDER WITH ASSISTANCE Description: STG Manage Bladder With mod I Assistance Outcome: Progressing   Problem: RH SKIN INTEGRITY Goal: RH STG SKIN FREE OF INFECTION/BREAKDOWN Description: Manage skin free of infection/breakdown with mod I assistance Outcome: Progressing   Problem: RH SAFETY Goal: RH STG ADHERE TO SAFETY PRECAUTIONS W/ASSISTANCE/DEVICE Description: STG Adhere to Safety Precautions With Mod I  Assistance/Device. Outcome: Progressing   Problem: RH PAIN MANAGEMENT Goal: RH STG PAIN MANAGED AT OR BELOW PT'S PAIN GOAL Description: <4 w/ prns Outcome: Progressing   Problem: RH KNOWLEDGE DEFICIT GENERAL Goal: RH STG INCREASE KNOWLEDGE OF SELF CARE AFTER HOSPITALIZATION Description: Manage increase knowledge of self care after hospitalization with mod I assistance from family using educational resources provided Outcome: Progressing

## 2023-09-27 NOTE — Progress Notes (Signed)
 Inpatient Rehabilitation Discharge Medication Review by a Pharmacist  A complete drug regimen review was completed for this patient to identify any potential clinically significant medication issues.  High Risk Drug Classes Is patient taking? Indication by Medication  Antipsychotic No   Anticoagulant No   Antibiotic Yes Augmentin : UTI  Opioid No   Antiplatelet Yes Aspirin : stroke ppx  Hypoglycemics/insulin  No   Vasoactive Medication No   Chemotherapy No   Other Yes Allopurinol : gout Carbamazepine : seizures Dutasteride , Flomax : urge incontinence/frequency  Levothyroxine : hypothyroid Tylenol : pain Miralax  - constipation Duoneb: wheezing/SOB Meclizine : dizziness     Type of Medication Issue Identified Description of Issue Recommendation(s)  Drug Interaction(s) (clinically significant)     Duplicate Therapy     Allergy     No Medication Administration End Date     Incorrect Dose     Additional Drug Therapy Needed     Significant med changes from prior encounter (inform family/care partners about these prior to discharge).    Other       Clinically significant medication issues were identified that warrant physician communication and completion of prescribed/recommended actions by midnight of the next day:  No  Time spent performing this drug regimen review (minutes): [address], PharmD, [place], AAHIVP, CPP Infectious Disease Pharmacist 09/27/2023 7:16 AM

## 2023-09-27 NOTE — Progress Notes (Signed)
 Patient ID: Brandon Flores, male   DOB: [birth year], 78 y.o.   MRN: [id number]  SW returned phone call to pt dr Olam [phone]) to follow-up and discuss discharge needs. Preferred outpatient location in [facility] location as closer to her parents home. SW reviewed discharge process.   SW faxed outpatient PT/OT referral to [facility] location.   Graeme Jude, MSW, LCSW Office: [phone] Cell: [phone] Fax: [phone]

## 2023-09-27 NOTE — Progress Notes (Signed)
 Occupational Therapy Discharge Summary  Patient Details  Name: Brandon Flores MRN: [id number] Date of Birth: 1945/09/20  Date of Discharge from OT service:September 27, 2023   Patient has met 8 of 8 long term goals due to improved activity tolerance, improved balance, postural control, and improved coordination.  Patient to discharge at overall Modified Independent level.  Patient's care partner is independent to provide the necessary physical assistance at discharge. Brandon Flores has quickly regained his independence and progressed to a mod I level. His wife was unable to attend family edu but he feels confident in his ability to share edu with his wife.   Recommendation:  Patient will benefit from ongoing skilled OT services in outpatient setting to continue to advance functional skills in the area of BADL and iADL.  Equipment: No equipment provided- patient already owns a TTB  Reasons for discharge: treatment goals met and discharge from hospital  Patient/family agrees with progress made and goals achieved: Yes  OT Discharge Precautions/Restrictions  Precautions Precautions: Fall Precaution/Restrictions Comments: History of vertigo Restrictions Weight Bearing Restrictions Per Provider Order: No    ADL ADL Eating: Independent Where Assessed-Eating: Chair Grooming: Modified independent Where Assessed-Grooming: Sitting at sink Upper Body Bathing: Modified independent Where Assessed-Upper Body Bathing: Shower Lower Body Bathing: Modified independent Where Assessed-Lower Body Bathing: Shower Upper Body Dressing: Modified independent (Device) Where Assessed-Upper Body Dressing: Chair Lower Body Dressing: Modified independent Where Assessed-Lower Body Dressing: Sitting at sink Toileting: Modified independent Where Assessed-Toileting: Teacher, adult education: Engineer, agricultural Method: Proofreader: Gaffer: Modified  independent Web designer Method: Ship broker: Insurance underwriter: Not assessed Vision Baseline Vision/History: 0 No visual deficits Patient Visual Report: No change from baseline Vision Assessment?: No apparent visual deficits Perception  Perception: Within Functional Limits Praxis Praxis: WFL Cognition Cognition Overall Cognitive Status: Within Functional Limits for tasks assessed Arousal/Alertness: Awake/alert Orientation Level: Person;Place;Situation Person: Oriented Place: Oriented Situation: Oriented Memory: Impaired Memory Impairment: Decreased short term memory Decreased Short Term Memory: Verbal basic;Functional basic Safety/Judgment: Appears intact Brief Interview for Mental Status (BIMS) Repetition of Three Words (First Attempt): 3 Temporal Orientation: Year: Correct Temporal Orientation: Month: Accurate within 5 days Temporal Orientation: Day: Correct Recall: Sock: No, could not recall Recall: Blue: Yes, no cue required Recall: Bed: No, could not recall BIMS Summary Score: 11 Sensation Sensation Light Touch: Impaired Detail Peripheral sensation comments: B-foot numbness Light Touch Impaired Details: Impaired RLE;Impaired LLE Coordination Gross Motor Movements are Fluid and Coordinated: No Fine Motor Movements are Fluid and Coordinated: Yes Coordination and Movement Description: Deficits due to generalized weakness/debility. Motor  Motor Motor: Within Functional Limits Mobility  Bed Mobility Bed Mobility: Supine to Sit;Sit to Supine Supine to Sit: Independent Sit to Supine: Independent Transfers Sit to Stand: Independent Stand to Sit: Independent  Trunk/Postural Assessment  Cervical Assessment Cervical Assessment: Within Functional Limits Thoracic Assessment Thoracic Assessment: Within Functional Limits Lumbar Assessment Lumbar Assessment: Within Functional Limits Postural Control Postural  Control: Within Functional Limits  Balance Balance Balance Assessed: Yes Static Sitting Balance Static Sitting - Level of Assistance: 7: Independent Dynamic Sitting Balance Dynamic Sitting - Level of Assistance: 7: Independent Static Standing Balance Static Standing - Level of Assistance: 6: Modified independent (Device/Increase time) Dynamic Standing Balance Dynamic Standing - Level of Assistance: 6: Modified independent (Device/Increase time) Extremity/Trunk Assessment RUE Assessment RUE Assessment: Within Functional Limits LUE Assessment LUE Assessment: Within Functional Limits   Brandon Flores OTR/L 09/27/2023, 7:52 AM

## 2023-09-27 NOTE — Progress Notes (Signed)
 PROGRESS NOTE   Subjective/Complaints:  No events overnight, no acute complaints.  Feeling very well, appropriate for discharge.  Very thankful to staff are working with him. Vitals are stable, aside from some hypertension.  Varies between 130s to highs of 170s systolic.  Patient asymptomatic.  Other vitals are stable.  ROS: Denies fevers, chills, N/V, abdominal pain, constipation, diarrhea, SOB, cough, chest pain, new weakness or paraesthesias.    Objective:   No results found. No results for input(s): WBC, HGB, HCT, PLT in the last 72 hours.  Recent Labs    09/26/23 0510  NA 139  K 3.8  CL 106  CO2 29  GLUCOSE 121*  BUN 17  CREATININE 1.07  CALCIUM 8.2*    Intake/Output Summary (Last 24 hours) at 09/27/2023 1407 Last data filed at 09/27/2023 1300 Gross per 24 hour  Intake 480 ml  Output 450 ml  Net 30 ml        Physical Exam: Vital Signs Blood pressure (!) 155/80, pulse 74, temperature 98.2 F (36.8 C), temperature source Oral, resp. rate 16, height 5' 9 (1.753 m), weight 107 kg, SpO2 97%.   PE: Constitution: Appropriate appearance for age. No apparent distress.  Sitting up in bedside wheelchair.  +Obese Resp: No respiratory distress. No accessory muscle usage. on RA Cardio: Well perfused appearance. No peripheral edema. Abdomen: Nondistended. Nontender.   Psych: Appropriate mood and affect. Neuro: AAOx4. No apparent cognitive deficits   Neurologic Exam:   Sensory exam: Responsive to light touch in all 4 extremities Motor exam: Moving all 4 limbs antigravity and against resistance, 5 out of 5 Coordination: No ataxia on finger-to-nose, heel-to-shin Tone: No spasticity MSK: No apparent deformities.  Moving all 4 limbs full active range of motion.    Assessment/Plan: 1. Functional deficits which require 3+ hours per day of interdisciplinary therapy in a comprehensive inpatient rehab  setting. Physiatrist is providing close team supervision and 24 hour management of active medical problems listed below. Physiatrist and rehab team continue to assess barriers to discharge/monitor patient progress toward functional and medical goals  Care Tool:  Bathing    Body parts bathed by patient: Right arm, Left arm, Chest, Abdomen, Front perineal area, Buttocks, Right upper leg, Left upper leg, Right lower leg, Left lower leg, Face         Bathing assist Assist Level: Independent with assistive device Assistive Device Comment: shower chair   Upper Body Dressing/Undressing Upper body dressing   What is the patient wearing?: Pull over shirt    Upper body assist Assist Level: Independent    Lower Body Dressing/Undressing Lower body dressing      What is the patient wearing?: Pants     Lower body assist Assist for lower body dressing: Independent     Toileting Toileting    Toileting assist Assist for toileting: Independent     Transfers Chair/bed transfer  Transfers assist     Chair/bed transfer assist level: Independent     Locomotion Ambulation   Ambulation assist      Assist level: Contact Guard/Touching assist Assistive device: No Device Max distance: 150'   Walk 10 feet activity   Assist  Assist level: Contact Guard/Touching assist Assistive device: No Device   Walk 50 feet activity   Assist    Assist level: Contact Guard/Touching assist Assistive device: No Device    Walk 150 feet activity   Assist    Assist level: Contact Guard/Touching assist Assistive device: No Device    Walk 10 feet on uneven surface  activity   Assist     Assist level: Contact Guard/Touching assist     Wheelchair     Assist Is the patient using a wheelchair?: Yes Type of Wheelchair: Manual    Wheelchair assist level: Dependent - Patient 0% Max wheelchair distance: 150'    Wheelchair 50 feet with 2 turns  activity    Assist        Assist Level: Dependent - Patient 0%   Wheelchair 150 feet activity     Assist      Assist Level: Dependent - Patient 0%   Blood pressure (!) 155/80, pulse 74, temperature 98.2 F (36.8 C), temperature source Oral, resp. rate 16, height 5' 9 (1.753 m), weight 107 kg, SpO2 97%.  Medical Problem List and Plan: 1. Functional deficits secondary to debility after urosepsis with exacerbation of baseline vertebral-basilar insufficiency              -hx of left temporal hemorrhage             -patient may shower             -ELOS/Goals: 7-10 days, mod I goals--DC 6/28 -Stable to continue inpatient rehab 6/25: Doing very well in therapies, anticipated to meet mod I goals by the end of this week.  The patient is medically ready for discharge to home and will need follow-up with [facility]. In addition, they will need to follow up with their PCP.    2.  Antithrombotics: -DVT/anticoagulation:  Pharmaceutical: Lovenox              -antiplatelet therapy: Was not on ASA PTA--will decrease ASA to 81 mg due to hx of temporal bleed.   - 6/25: Patient ambulating greater than 200 feet x 2 with therapy session; DC Lovenox   3. Pain Management: Tylenol  prn.  No pain.  4. Mood/Behavior/Sleep: LCSW to follow for evaluation and support.              -antipsychotic agents: N/A 5. Neuropsych/cognition: This patient is capable of making decisions on his own behalf. 6. Skin/Wound Care: Routine pressure relief measures.  7. Fluids/Electrolytes/Nutrition: Monitor I/O. Check CMET in am. 10.  Sepsis secondary to E. Coli bacteremia -pt received 5 days of ceftriaxone  -begin augmentin  for 5 additional days thru 6/28  --Cystitis likely due to UTI,   11.  Wheezing: Does have elevated right diaphragm with low volumes. Hx of 3 PPD smoking in the past --Will monitor respiratory status with increase in activity.              --Encourage pulmonary hygiene. Will add incentive  spirometry.   - 6/24: No wheezing appreciated on exam  12.  H/o VB insufficency w/Left temporal encephalomalacia: Acute on chronic vertigo.  --Continue vestibular tx. Meclizine  prn as at home.   15.  Gait disorder w/falls: Likely multifactorial. Will also order orthostatic vitals to rule out as cause.  --Vestibular Tx, awareness of deficits as well as safety.  - 6/25: Doing very well with stability and awareness; continue with therapies  11.  Acute renal failure: SCr up to 1.3 since last check 03/13 likely due  to cystitis, mobic and fall.  --Will monitor voiding to rule out retention as cause.  -6/24: Creatinine improved to 1.03; BUN within normal limits.  Monitor fluid intakes, regular labs. 6/26: BUN, creatinine stable.  12.  H/o of partial complex seizures: On Tegretol  400 mg TID per  06/2023 visit w/Dr. Gregg. Random level elevated (after am dose?)             --seizures consist of uncontrollable movement of hands mainly rubbing abdomen.              --will check tegretol  level in am --11.0, within normal limits  13. H/o of prostate cancer: Has urge incontinence/frequency. Continue dutasteride  and tamsulosin .             --toilet patient every 2-3 hours. Monitor voiding with PVR checks. - 6/24: No PVRs, has been voiding continent.  Will remind nursing. 6/25: PVRs low; DC  14. Hypomagnesemia: Was 1.6 @ admission and improved 2.0.  --will recheck level in am.  -6-24: Level 1.8, within normal limits.  15. Advanced Cervical DDD/ Lumbar spondylolisthesis:  Recent Rx- percocet 5 on 06/16 #30 per Dr. Hughie.              --Was also on Mobic and will not resume at this time.   6/25: Pain well-controlled with conservative measures; will not resume narcotics or NSAIDs, continue with as needed Tylenol   16. Constipation: Will add daily miralax . Sorbitol  today if no results.    LBM 6/20 - will give sorbitol  today 6/24-2x bowel movement, no amount recorded   17. Hypertension.  Intermittently  to 170s systolic.  Add amlodipine 2.5 mg daily--treat conservatively given vertebrobasilar insufficiency.  Further monitoring and titration per PCP on discharge.    LOS: 4 days A FACE TO FACE EVALUATION WAS PERFORMED  Joesph JAYSON Likes 09/27/2023, 2:07 PM

## 2023-09-27 NOTE — Progress Notes (Signed)
 Inpatient Rehabilitation Care Coordinator Discharge Note   Patient Details  Name: Brandon Flores MRN: [id number] Date of Birth: 06-24-1945   Discharge location: D/c to home  Length of Stay: 4 days  Discharge activity level: Mod I  Home/community participation: Limited  Patient response un:Yzjouy Literacy - How often do you need to have someone help you when you read instructions, pamphlets, or other written material from your doctor or pharmacy?: Never  Patient response un:Dnrpjo Isolation - How often do you feel lonely or isolated from those around you?: Rarely  Services provided included: MD, RD, PT, OT, SLP, RN, CM, Pharmacy, Neuropsych, SW, TR  Financial Services:  Financial Services Utilized: Medicare    Choices offered to/list presented to: patient  Follow-up services arranged:  Outpatient    Outpatient Servicies: [facility] for PT/OT      Patient response to transportation need: Is the patient able to respond to transportation needs?: Yes In the past 12 months, has lack of transportation kept you from medical appointments or from getting medications?: No In the past 12 months, has lack of transportation kept you from meetings, work, or from getting things needed for daily living?: No   Patient/Family verbalized understanding of follow-up arrangements:  Yes  Individual responsible for coordination of the follow-up plan: contact pt  Confirmed correct DME delivered: [name] 09/27/2023    Comments (or additional information):  Summary of Stay    Date/Time Discharge Planning CSW  09/24/23 1003 TBA. Per EMR, pt wil discharge to home with his wife. SW will confirm there are no barriers to discharge. AAC       Kellan Raffield A Jude

## 2023-09-27 NOTE — Progress Notes (Signed)
 Occupational Therapy Session Note  Patient Details  Name: Brandon Flores MRN: [id number] Date of Birth: June 05, 1945  Session 1 Today's Date: 09/27/2023 OT Individual Time: 9265-9166 OT Individual Time Calculation (min): 59 min   Session 2 Today's Date: 09/27/2023 OT Individual Time: 1045-1200 OT Individual Time Calculation (min): 75 min    Short Term Goals: Week 1:  OT Short Term Goal 1 (Week 1): STGs=LTGs due to patient's estimated length of stay.  Skilled Therapeutic Interventions/Progress Updates:    Session 1 Pt received sitting up with no c/o pain, agreeable to OT session. Discussed d/c planning throughout session. Pt declined ADLs. Discussed safety. He completed 150 ft of functional mobility to the ADL apt with mod I. He completed a tub transfer using the TTB with mod I, demonstrating appropriate safety. Remainder of session focused on improving endurance and dynamic balance for carryover to ADL and to reduce fall risk. He completed weighted carry and item retrieval with a laundry basket- getting up to 10 lbs and completing an additional 200 ft of functional mobility. Pt very winded following. HR 107 bpm following and SpO2 98%. Pt required use of rest breaks throughout session for recovery, as well as to support safety and prevent overexertion. During breaks, OT monitored recovery time to assess endurance and response to exertion. Pt completed floor transfer, completing the following sequence after a demonstration lowering themselves to the a floor mat, getting into full supine, transitioning into quadruped, and then kneeling, before pivot into sitting EOM- simulating floor to couch or chair at home. OT provided education on fall recovery, when to get up independently vs when to call for EMS after a fall. Pt returned the demonstration with (S) for two trials. He returned to his room following and was left sitting up with all needs met.    Session 2 Pt received supine with no c/o pain,  agreeable to OT session.  He came to EOB independently. 150 ft of functional mobility with no device, mod I. He completed multiple set and variations of balance and coordination challenges with the agility ladder. He required CGA and had multiple small LOB's with backward mobility. Pt required use of rest breaks throughout session for recovery, as well as to support safety and prevent overexertion. During breaks, OT monitored recovery time to assess endurance and response to exertion. He then transitioned to prone on the mat with mod I. He was instructed in 3x10 prone push ups and supermans to challenge posterior chain strengthening and generalized activity tolerance.Pt completed the NuStep for 10 min on level 7 to challenge BUE/BLE strength and endurance needed to complete ADLs and IADLs with the highest level of independence.Challenged pt to increase intensity and cardiovascular demands. He returned to his room and was left sitting up with all needs met. Pt made mod I in the room.    Therapy Documentation Precautions:  Precautions Precautions: Fall Recall of Precautions/Restrictions: Intact Precaution/Restrictions Comments: History of vertigo Restrictions Weight Bearing Restrictions Per Provider Order: No  Therapy/Group: Individual Therapy  Nena VEAR Moats 09/27/2023, 7:52 AM

## 2023-09-27 NOTE — Progress Notes (Signed)
 Physical Therapy Discharge Summary  Patient Details  Name: Brandon Flores MRN: [id number] Date of Birth: [birth year]  Date of Discharge from PT service:September 27, 2023  Today's Date: 09/27/2023 PT Individual Time: 8594-8484 PT Individual Time Calculation (min): 70 min    Patient has met 8 of 8 long term goals due to improved activity tolerance, improved balance, improved postural control, increased strength, and improved coordination.  Patient to discharge at an ambulatory level Modified Independent.   Patient's care partner is independent to provide the necessary physical assistance at discharge.  Reasons goals not met: NA  Recommendation:  Patient will benefit from ongoing skilled PT services in outpatient setting to continue to advance safe functional mobility, address ongoing impairments in balance, ambulation, endurance, and minimize fall risk.  Equipment: No equipment provided  Reasons for discharge: treatment goals met and discharge from hospital  Patient/family agrees with progress made and goals achieved: Yes  Skilled Therapeutic Interventions: Pt received seated on edge of bed and agrees to therapy. Reports slight pain in low back. Number not provided. PT provides rest breaks as needed to manage pain. Pt performs sit to stand independently, then ambulates x300' to ortho gym with cues for increasing stride length and internal rotation of hips to improve gait pattern and balance. Pt completes ramp navigation and car transfer with cues for sequencing, then ambulates to main gym. Pt completes x16 6 steps with right handrail with cues for step sequencing and safety. Pt then completes standing activities in parallel bars for balance training and strengthening. Pt performs standing hip abduction with yellow theraband for resistance, 2x10 with cues for body mechanics and correct performance, x10 standing hip extensions with same cues. Pt performs sidestepping to the right and left with  yellow theraband with mirror for visual feedback and cues to maintain tension on band throughout activity. Pt completes 6 minute walk test without AD, ambulating 1275' (up from 1050' at eval). PT provides pt with HEP and pt left supine in bed with all needs within reach.    PT Discharge Precautions/Restrictions Precautions Precautions: Fall Restrictions Weight Bearing Restrictions Per Provider Order: No Pain Interference Pain Interference Pain Effect on Sleep: 1. Rarely or not at all Pain Interference with Therapy Activities: 1. Rarely or not at all Pain Interference with Day-to-Day Activities: 1. Rarely or not at all Vision/Perception  Vision - History Ability to See in Adequate Light: 1 Impaired Perception Perception: Within Functional Limits Praxis Praxis: WFL  Cognition Overall Cognitive Status: Within Functional Limits for tasks assessed Arousal/Alertness: Awake/alert Memory: Impaired Memory Impairment: Decreased short term memory Decreased Short Term Memory: Verbal basic;Functional basic Safety/Judgment: Appears intact Sensation Sensation Light Touch: Impaired Detail Peripheral sensation comments: B-foot numbness Light Touch Impaired Details: Impaired RLE;Impaired LLE Coordination Gross Motor Movements are Fluid and Coordinated: No Fine Motor Movements are Fluid and Coordinated: Yes Coordination and Movement Description: Deficits due to generalized weakness/debility. Motor  Motor Motor: Within Functional Limits  Mobility Bed Mobility Bed Mobility: Supine to Sit;Sit to Supine Supine to Sit: Independent Sit to Supine: Independent Transfers Transfers: Sit to Stand;Stand to [employer] Transfers Sit to Stand: Independent Stand to Sit: Independent Stand Pivot Transfers: Independent Locomotion  Gait Ambulation: Yes Gait Assistance: Independent Gait Distance (Feet): 1275 Feet Assistive device: None Gait Gait: Yes Gait Pattern: Impaired Gait Pattern: Wide  base of support;Decreased stride length Stairs / Additional Locomotion Stairs: Yes Stairs Assistance: Supervision/Verbal cueing Stair Management Technique: One rail Right Number of Stairs: 16 Height of Stairs: 6 Ramp:  Supervision/Verbal cueing Curb: Supervision/Verbal cueing Wheelchair Mobility Wheelchair Mobility: No  Trunk/Postural Assessment  Cervical Assessment Cervical Assessment: Within Functional Limits Thoracic Assessment Thoracic Assessment: Within Functional Limits Lumbar Assessment Lumbar Assessment: Within Functional Limits Postural Control Postural Control: Within Functional Limits  Balance Balance Balance Assessed: Yes Static Sitting Balance Static Sitting - Level of Assistance: 7: Independent Dynamic Sitting Balance Dynamic Sitting - Level of Assistance: 7: Independent Static Standing Balance Static Standing - Level of Assistance: 6: Modified independent (Device/Increase time) Dynamic Standing Balance Dynamic Standing - Level of Assistance: 6: Modified independent (Device/Increase time) Extremity Assessment  RLE Assessment RLE Assessment: Exceptions to [facility] General Strength Comments: Grossly 4/5 LLE Assessment LLE Assessment: Exceptions to [facility] General Strength Comments: Grossly 4/5   Elsie JAYSON Dawn, PT, DPT 09/27/2023, 4:18 PM

## 2023-09-28 ENCOUNTER — Other Ambulatory Visit (HOSPITAL_COMMUNITY): Payer: Self-pay

## 2023-09-28 DIAGNOSIS — I10 Essential (primary) hypertension: Secondary | ICD-10-CM

## 2023-09-28 NOTE — Progress Notes (Signed)
 PROGRESS NOTE   Subjective/Complaints:  Pt doing well, ready go to home! Slept well, denies pain, LBM just now, urinating fine. No other complaints or concerns. Reviewed d/c paperwork with him and daughter.   ROS: Denies fevers, chills, N/V, abdominal pain, constipation, diarrhea, SOB, cough, chest pain, new weakness or paraesthesias.    Objective:   No results found. No results for input(s): WBC, HGB, HCT, PLT in the last 72 hours.  Recent Labs    09/26/23 0510  NA 139  K 3.8  CL 106  CO2 29  GLUCOSE 121*  BUN 17  CREATININE 1.07  CALCIUM 8.2*    Intake/Output Summary (Last 24 hours) at 09/28/2023 0837 Last data filed at 09/27/2023 1900 Gross per 24 hour  Intake 598 ml  Output --  Net 598 ml        Physical Exam: Vital Signs Blood pressure 138/73, pulse 66, temperature 98.6 F (37 C), temperature source Oral, resp. rate 18, height 5' 9 (1.753 m), weight 107 kg, SpO2 98%.   PE: Constitution: Appropriate appearance for age. No apparent distress.  Sitting up in bedside wheelchair.  +Obese Resp: No respiratory distress. No accessory muscle usage. on RA Cardio: Well perfused appearance. No peripheral edema. RRR no m/r/g Abdomen: Nondistended. Nontender.  Soft. +BS throughout.  Psych: Appropriate mood and affect. Lighthearted.  Neuro: AAOx4. No apparent cognitive deficits   PRIOR EXAMS:  Neurologic Exam:   Sensory exam: Responsive to light touch in all 4 extremities Motor exam: Moving all 4 limbs antigravity and against resistance, 5 out of 5 Coordination: No ataxia on finger-to-nose, heel-to-shin Tone: No spasticity MSK: No apparent deformities.  Moving all 4 limbs full active range of motion.    Assessment/Plan: 1. Functional deficits which require 3+ hours per day of interdisciplinary therapy in a comprehensive inpatient rehab setting. Physiatrist is providing close team supervision and 24  hour management of active medical problems listed below. Physiatrist and rehab team continue to assess barriers to discharge/monitor patient progress toward functional and medical goals  Care Tool:  Bathing    Body parts bathed by patient: Right arm, Left arm, Chest, Abdomen, Front perineal area, Buttocks, Right upper leg, Left upper leg, Right lower leg, Left lower leg, Face         Bathing assist Assist Level: Independent with assistive device Assistive Device Comment: shower chair   Upper Body Dressing/Undressing Upper body dressing   What is the patient wearing?: Pull over shirt    Upper body assist Assist Level: Independent    Lower Body Dressing/Undressing Lower body dressing      What is the patient wearing?: Pants     Lower body assist Assist for lower body dressing: Independent     Toileting Toileting    Toileting assist Assist for toileting: Independent     Transfers Chair/bed transfer  Transfers assist     Chair/bed transfer assist level: Independent     Locomotion Ambulation   Ambulation assist      Assist level: Independent Assistive device: No Device Max distance: 1275'   Walk 10 feet activity   Assist     Assist level: Independent Assistive device: No Device  Walk 50 feet activity   Assist    Assist level: Independent Assistive device: No Device    Walk 150 feet activity   Assist    Assist level: Independent Assistive device: No Device    Walk 10 feet on uneven surface  activity   Assist     Assist level: Supervision/Verbal cueing     Wheelchair     Assist Is the patient using a wheelchair?: No Type of Wheelchair: Manual    Wheelchair assist level: Dependent - Patient 0% Max wheelchair distance: 150'    Wheelchair 50 feet with 2 turns activity    Assist        Assist Level: Dependent - Patient 0%   Wheelchair 150 feet activity     Assist      Assist Level: Dependent -  Patient 0%   Blood pressure 138/73, pulse 66, temperature 98.6 F (37 C), temperature source Oral, resp. rate 18, height 5' 9 (1.753 m), weight 107 kg, SpO2 98%.  Medical Problem List and Plan: 1. Functional deficits secondary to debility after urosepsis with exacerbation of baseline vertebral-basilar insufficiency              -hx of left temporal hemorrhage             -patient may shower             -ELOS/Goals: 7-10 days, mod I goals--DC 6/28 -Stable to continue inpatient rehab 6/25: Doing very well in therapies, anticipated to meet mod I goals by the end of this week. -09/28/23 d/c paperwork reviewed with pt and daughter.   The patient is medically ready for discharge to home and will need follow-up with [facility] PM&R. In addition, they will need to follow up with their PCP.    2.  Antithrombotics: -DVT/anticoagulation:  Pharmaceutical: Lovenox              -antiplatelet therapy: Was not on ASA PTA--will decrease ASA to 81 mg due to hx of temporal bleed.   - 6/25: Patient ambulating greater than 200 feet x 2 with therapy session; DC Lovenox   3. Pain Management: Tylenol  prn.  No pain.  4. Mood/Behavior/Sleep: LCSW to follow for evaluation and support.              -antipsychotic agents: N/A 5. Neuropsych/cognition: This patient is capable of making decisions on his own behalf. 6. Skin/Wound Care: Routine pressure relief measures.  7. Fluids/Electrolytes/Nutrition: Monitor I/O. Check CMET in am. 10.  Sepsis secondary to E. Coli bacteremia -pt received 5 days of ceftriaxone  -begin augmentin  for 5 additional days thru 6/28  --Cystitis likely due to UTI,   11.  Wheezing: Does have elevated right diaphragm with low volumes. Hx of 3 PPD smoking in the past --Will monitor respiratory status with increase in activity.              --Encourage pulmonary hygiene. Will add incentive spirometry.   - 6/24: No wheezing appreciated on exam  12.  H/o VB insufficency w/Left temporal  encephalomalacia: Acute on chronic vertigo.  --Continue vestibular tx. Meclizine  prn as at home.   15.  Gait disorder w/falls: Likely multifactorial. Will also order orthostatic vitals to rule out as cause.  --Vestibular Tx, awareness of deficits as well as safety.  - 6/25: Doing very well with stability and awareness; continue with therapies  11.  Acute renal failure: SCr up to 1.3 since last check 03/13 likely due to cystitis, mobic and fall.  --Will monitor  voiding to rule out retention as cause.  -6/24: Creatinine improved to 1.03; BUN within normal limits.  Monitor fluid intakes, regular labs. 6/26: BUN, creatinine stable.  12.  H/o of partial complex seizures: On Tegretol  400 mg TID per  06/2023 visit w/Dr. Gregg. Random level elevated (after am dose?)             --seizures consist of uncontrollable movement of hands mainly rubbing abdomen.              --will check tegretol  level in am --11.0, within normal limits  13. H/o of prostate cancer: Has urge incontinence/frequency. Continue dutasteride  and tamsulosin .             --toilet patient every 2-3 hours. Monitor voiding with PVR checks. - 6/24: No PVRs, has been voiding continent.  Will remind nursing. 6/25: PVRs low; DC  14. Hypomagnesemia: Was 1.6 @ admission and improved 2.0.  --will recheck level in am.  -6-24: Level 1.8, within normal limits.  15. Advanced Cervical DDD/ Lumbar spondylolisthesis:  Recent Rx- percocet 5 on 06/16 #30 per Dr. Hughie.              --Was also on Mobic and will not resume at this time.   6/25: Pain well-controlled with conservative measures; will not resume narcotics or NSAIDs, continue with as needed Tylenol   16. Constipation: Will add daily miralax . Sorbitol  today if no results.    LBM 6/20 - will give sorbitol  today 6/24-2x bowel movement, no amount recorded   17. Hypertension.  Intermittently to 170s systolic.  Add amlodipine 2.5 mg daily--treat conservatively given vertebrobasilar  insufficiency.  Further monitoring and titration per PCP on discharge.  -09/28/23 BP doing well, monitor outpatient    LOS: 5 days A FACE TO FACE EVALUATION WAS PERFORMED  [address] 09/28/2023, 8:37 AM

## 2023-10-01 NOTE — Discharge Summary (Signed)
 Physician Discharge Summary  Patient ID: Brandon Flores MRN: [id number] DOB/AGE: 77-Jan-1947 77 y.o.  Admit date: 09/23/2023 Discharge date: 09/28/2023  Discharge Diagnoses:  Principal Problem:   Debility Active Problems:   GERD (gastroesophageal reflux disease)   VBI (vertebrobasilar insufficiency)   Generalized convulsive epilepsy (HCC)   Chronic neck pain   Discharged Condition: stable  Significant Diagnostic Studies:   Labs:  Basic Metabolic Panel:    Latest Ref Rng & Units 09/26/2023    5:10 AM 09/24/2023    4:55 AM 09/23/2023    4:50 AM  BMP  Glucose 70 - 99 mg/dL 878  880  836   BUN 8 - 23 mg/dL 17  17  20    Creatinine 0.61 - 1.24 mg/dL 8.92  8.96  8.71   Sodium 135 - 145 mmol/L 139  142  141   Potassium 3.5 - 5.1 mmol/L 3.8  4.1  3.7   Chloride 98 - 111 mmol/L 106  107  110   CO2 22 - 32 mmol/L 29  25  22    Calcium 8.9 - 10.3 mg/dL 8.2  8.3  8.1      CBC:    Latest Ref Rng & Units 09/24/2023    9:21 AM 09/23/2023    4:50 AM 09/21/2023    6:57 AM  CBC  WBC 4.0 - 10.5 K/uL 6.3  4.7  7.0   Hemoglobin 13.0 - 17.0 g/dL 86.6  87.7  87.9   Hematocrit 39.0 - 52.0 % 39.6  36.6  34.8   Platelets 150 - 400 K/uL 208  166  130      CBG: No results for input(s): GLUCAP in the last 168 hours.  Brief HPI:   Brandon Flores is a 78 y.o. male with history of left temporal hemorrhage with encephalomalacia, petit mall disorder, BPPV, vertebrobasilar insufficiency, prostate cancer, gait disorder who was admitted on 09/19/2023 with dizziness and fall with inability to get up.  He was noted to be febrile, had abdominal pain with evidence of UTI and was started on ceftriaxone  for treatment.  Blood cultures done showed E. coli enterobacteria Alice.  MRI brain done was negative for acute changes and showed sequela of remote hemorrhage anterior left temporal lobe.  CTA head/neck was negative for LVO.  CT abdomen pelvis done revealing bladder wall thickening with pericystic edema  suspicious for cystitis and hyperattenuation dependent bladder favored to be early contrast secretion rather than new bladder stone.  He treated with 5 day course of IV ceftriaxone  and transitioned to Augmentin  at discharge. Therapy was consulted and patient was noted requiring contact-guard assist with ADLs and min assist with mobility.  He was independent prior to admission.  CIR was recommended due to functional decline.   Hospital Course: Brandon Flores was admitted to rehab 09/23/2023 for inpatient therapies to consist of PT and OT at least three hours five days a week. Past admission physiatrist, therapy team and rehab RN have worked together to provide customized collaborative inpatient rehab. His blood pressures were monitored on TID basis and been added due to intermittent rise in systolic BP readings.  Acute on chronic vertigo has been managed with use of meclizine  as needed as well as vestibular treatment.  Augmentin  was added for 5 additional days to treat cystitis likely due to UTI.  Pulmonary hygiene was encouraged and incentive spirometry added to help with respiratory status.  Follow-up labs done revealed magnesium  levels to be within normal limits at  1.8.  Chronic pain has been reasonably controlled with conservative measures and use of Tylenol  on as needed basis.  MiraLax  was added to help manage constipation.  Follow-up check of labs revealed improvement in renal status with serum creatinine down to 1.03 and BUN within normal limits.  Follow-up am Tegretol  level was WNL at 11 on his home regimen and he was seizure free during his stay.  He made great gains and was independent at discharge. He will contiue to receive follow up outpatient PT and  OT at [facility] neuro rehab at [facility] after discharge.   Rehab course: During patient's stay in rehab weekly team conferences were held to monitor patient's progress, set goals and discuss barriers to discharge. At admission, patient required CGA for  mobility and supervision to contact-guard assist with ADL tasks.  Cognitive evaluation revealed mild short-term memory deficits which were reported to be at baseline and expressive and receptive language were intact therefore no speech therapy warranted during this stay. He  has had improvement in activity tolerance, balance, postural control as well as ability to compensate for deficits. He is able to complete ADL tasks at modified independent level. He is modified independent for transfers and is able to ambulate 1275 feet without AD. He has been educated on HEP.   Discharge disposition: 01-Home or Self Care  Diet: Regular.   Special Instructions: No driving or strenuous activity till cleared by MD.   Discharge Instructions     Ambulatory referral to Occupational Therapy   Complete by: As directed    Evaluate and treat   Ambulatory referral to Physical Therapy   Complete by: As directed    Evaluate and treat   Ambulatory referral to Speech Therapy   Complete by: As directed    Evaluate and treat      Allergies as of 09/28/2023   No Known Allergies      Medication List     STOP taking these medications    meloxicam 15 MG tablet Commonly known as: MOBIC   oxyCODONE -acetaminophen  5-325 MG tablet Commonly known as: PERCOCET/ROXICET       TAKE these medications    acetaminophen  325 MG tablet Commonly known as: TYLENOL  Take 1-2 tablets (325-650 mg total) by mouth every 4 (four) hours as needed for mild pain (pain score 1-3).   allopurinol  100 MG tablet Commonly known as: ZYLOPRIM  Take 1 tablet (100 mg total) by mouth daily.   amLODipine 2.5 MG tablet Commonly known as: NORVASC Take 1 tablet (2.5 mg total) by mouth daily.   amoxicillin -clavulanate 875-125 MG tablet Commonly known as: AUGMENTIN  Take 1 tablet by mouth 2 (two) times daily to complete 4 days   aspirin  EC 81 MG tablet Take 1 tablet (81 mg total) by mouth daily. Swallow whole.   carbamazepine  200 MG  tablet Commonly known as: TEGretol  Take 2 tablets (400 mg total) by mouth 3 (three) times daily. WILL TRY SWITCH TO GENERIC   dutasteride  0.5 MG capsule Commonly known as: AVODART  Take 1 capsule (0.5 mg total) by mouth every evening.   levothyroxine  112 MCG tablet Commonly known as: SYNTHROID  Take 112 mcg by mouth daily.   meclizine  25 MG tablet Commonly known as: ANTIVERT  Take 1 tablet (25 mg total) by mouth 3 (three) times daily as needed for dizziness.   polyethylene glycol 17 g packet Commonly known as: MIRALAX  / GLYCOLAX  Take 17 g by mouth daily.   tamsulosin  0.4 MG Caps capsule Commonly known as: FLOMAX  Take 2 capsules (  0.8 mg total) by mouth daily after supper.        Follow-up Information     Hilts, Ozell, MD Follow up.   Specialty: Family Medicine Why: Call in 1-2 days for post hospital follow up Contact information: [address] [place]  [place] [address] [phone]         Emeline Search C, DO Follow up.   Specialty: Physical Medicine and Rehabilitation Why: No formal follow-up needed Contact information: [address] [place] [address] [phone]                 Signed: Sharlet GORMAN Schmitz 10/01/2023, 5:17 PM

## 2023-10-23 ENCOUNTER — Encounter: Attending: Physical Medicine and Rehabilitation | Admitting: Physical Medicine and Rehabilitation

## 2023-10-23 ENCOUNTER — Encounter: Payer: Self-pay | Admitting: Physical Medicine and Rehabilitation

## 2023-10-23 VITALS — BP 130/77 | HR 66 | Ht 69.0 in | Wt 233.0 lb

## 2023-10-23 DIAGNOSIS — G450 Vertebro-basilar artery syndrome: Secondary | ICD-10-CM | POA: Insufficient documentation

## 2023-10-23 DIAGNOSIS — R42 Dizziness and giddiness: Secondary | ICD-10-CM | POA: Diagnosis not present

## 2023-10-23 DIAGNOSIS — R5381 Other malaise: Secondary | ICD-10-CM | POA: Diagnosis not present

## 2023-10-23 NOTE — Progress Notes (Signed)
 Subjective:    Patient ID: Brandon Flores, male    DOB: 1945-12-12, 78 y.o.   MRN: [id number]  HPI  Brandon Flores is a 78 y.o. year old male  who  has a past medical history of Cervical radiculopathy, Colon polyp, Complex partial seizure disorder (HCC) (10/08/2014), COVID-19 (2021), GERD (gastroesophageal reflux disease), Gout, Hypercholesterolemia, Lateral epicondylitis, Migraine, Ophthalmalgia, Orthodontics, Partial epilepsy with impairment of consciousness (HCC), Plantar fasciitis, Prostate cancer (HCC), Seizure disorder (HCC), Seizures (HCC), Traumatic incomplete tear of left rotator cuff (2024), UTI (urinary tract infection), Vertebrobasilar insufficiency, and Vertigo.   They are presenting to PM&R clinic for follow up related to debility after urosepsis with exacerbation of baseline vertebral-basilar insufficiency with IPR stay DC 09/28/23 .   Interval Hx:  - Therapies: He has PT/OT/SLP evaluations 7/30. He is feeling fine. Independent ADLs, IADLS. He is driving confidently .   - Follow ups: Saw his PCP.   - Falls:none  - IFZ:wnwz  - Medications: no changes; tylenol  for pain. Finished antibiotics. Still using miralax ; having BM every other day.  No vertigo since leaving the hospital   - Other concerns: none  Pain Inventory Average Pain 0 Pain Right Now 0 My pain is No pain  LOCATION OF PAIN  No pain  BOWEL Number of stools per week: 3 Oral laxative use Yes  Type of laxative Good Sense Stimulant Laxative Plus Enema or suppository use No  History of colostomy No  Incontinent No   BLADDER Normal and Pads  Bladder incontinence Yes  (Prostate Cancer)    Mobility walk without assistance how many minutes can you walk? 30 ability to climb steps?  yes do you drive?  yes Do you have any goals in this area?  yes  Function retired Do you have any goals in this area?  yes  Neuro/Psych bladder control problems dizziness  Prior Studies Any changes since last visit?   no  Physicians involved in your care Any changes since last visit?  no   Family History  Problem Relation Age of Onset   Alcohol abuse Father    COPD Sister    Lung cancer Sister    Cancer Sister    Lung cancer Brother    Cancer Brother    Cancer Sister        lung   Lung cancer Sister    Lung cancer Brother    Cancer Brother    Seizures Neg Hx    Colon cancer Neg Hx    Esophageal cancer Neg Hx    Rectal cancer Neg Hx    Stomach cancer Neg Hx    Breast cancer Neg Hx    Prostate cancer Neg Hx    Pancreatic cancer Neg Hx    Social History   Socioeconomic History   Marital status: Married    Spouse name: Dorothe Collie   Number of children: 2   Years of education: 13   Highest education level: Not on file  Occupational History   Occupation: Financial planner    Comment: [employer]  Tobacco Use   Smoking status: Former    Current packs/day: 0.00    Average packs/day: 3.0 packs/day for 20.0 years (60.0 ttl pk-yrs)    Types: Cigarettes    Start date: 04/02/1966    Quit date: 04/02/1986    Years since quitting: 37.5   Smokeless tobacco: Never   Tobacco comments:    Quit 1988, was a heavy smoker (20-30/day)  Vaping Use   Vaping status: Never Used  Substance and Sexual Activity   Alcohol use: No   Drug use: No   Sexual activity: Not Currently  Other Topics Concern   Not on file  Social History Narrative   Occasional glass of caffeine   Patient is right handed.   Lives at home with his wife   They have 6 children together and 15 grandchildren   Social Drivers of Corporate investment banker Strain: Not on file  Food Insecurity: No Food Insecurity (09/19/2023)   Hunger Vital Sign    Worried About Running Out of Food in the Last Year: Never true    Ran Out of Food in the Last Year: Never true  Transportation Needs: No Transportation Needs (09/19/2023)   PRAPARE - Administrator, Civil Service (Medical): No    Lack of Transportation  (Non-Medical): No  Physical Activity: Not on file  Stress: Not on file  Social Connections: Socially Integrated (09/19/2023)   Social Connection and Isolation Panel    Frequency of Communication with Friends and Family: More than three times a week    Frequency of Social Gatherings with Friends and Family: More than three times a week    Attends Religious Services: 1 to 4 times per year    Active Member of Clubs or Organizations: Yes    Attends Banker Meetings: 1 to 4 times per year    Marital Status: Married   Past Surgical History:  Procedure Laterality Date   BACK SURGERY     HEMORROIDECTOMY     NECK SURGERY     PROSTATE BIOPSY     Past Medical History:  Diagnosis Date   Cervical radiculopathy    Colon polyp    (2006- adenoma and tubulovillous adenoma; 2010-adenomas; repeat 201)-Dr. Vicci   Complex partial seizure disorder (HCC) 10/08/2014   COVID-19 2021   GERD (gastroesophageal reflux disease)    Gout    Hypercholesterolemia    Lateral epicondylitis    2002   Migraine    headaches   Ophthalmalgia    Dr. Roz   Orthodontics    Dr. Hughie   Partial epilepsy with impairment of consciousness (HCC)    Plantar fasciitis    Prostate cancer (HCC)    Seizure disorder (HCC)    Dr. Cleotilde ([facility])   Seizures [facility])    Traumatic incomplete tear of left rotator cuff 2024   UTI (urinary tract infection)    Vertebrobasilar insufficiency    Vertigo    BP 130/77   Pulse 66   Ht 5' 9 (1.753 m)   Wt 233 lb (105.7 kg)   SpO2 95%   BMI 34.41 kg/m   Opioid Risk Score:   Fall Risk Score:  `1  Depression screen PHQ 2/9     10/23/2023    1:13 PM 12/18/2016    4:21 PM 12/18/2016    3:37 PM 04/26/2016    8:15 AM  Depression screen PHQ 2/9  Decreased Interest 0 0 0 0  Down, Depressed, Hopeless 0 0 0 0  PHQ - 2 Score 0 0 0 0  Altered sleeping 0     Tired, decreased energy 1     Change in appetite 0     Feeling bad or failure about yourself  0     Trouble  concentrating 0     Moving slowly or fidgety/restless 0     Suicidal thoughts 0  PHQ-9 Score 1       Review of Systems  Genitourinary:        Incontinence (Prostate Cancer)  Neurological:  Positive for dizziness.  All other systems reviewed and are negative.      Objective:   Physical Exam PE: Constitution: Appropriate appearance for age. No apparent distress.  Sitting up in bedside wheelchair.  +Obese Resp: No respiratory distress. No accessory muscle usage. on RA Cardio: Well perfused appearance. No peripheral edema. Abdomen: Nondistended. Nontender.   Psych: Appropriate mood and affect.    Neurologic Exam:   Sensory exam: Responsive to light touch in all 4 extremities Motor exam: Moving all 4 limbs antigravity and against resistance, 5 out of 5 Coordination: No ataxia on finger-to-nose, heel-to-shin + Mild balance difficulty feet together and tandem gait Tone: No spasticity MSK: No apparent deformities.  Moving all 4 limbs full active range of motion.  Gait: External rotaiton of bilateral feet; normal.  Cognition: AAOx4. No apparent cognitive deficits. Remembers 3/3 on 5 minute test.  CN 2-12 intact.  + Mild nystagmus with right end gaze; asymptomatic     Assessment & Plan:  Brandon Flores is a 78 y.o. year old male  who  has a past medical history of Cervical radiculopathy, Colon polyp, Complex partial seizure disorder (HCC) (10/08/2014), COVID-19 (2021), GERD (gastroesophageal reflux disease), Gout, Hypercholesterolemia, Lateral epicondylitis, Migraine, Ophthalmalgia, Orthodontics, Partial epilepsy with impairment of consciousness (HCC), Plantar fasciitis, Prostate cancer (HCC), Seizure disorder (HCC), Seizures (HCC), Traumatic incomplete tear of left rotator cuff (2024), UTI (urinary tract infection), Vertebrobasilar insufficiency, and Vertigo.    They are presenting to PM&R clinic for follow up related to debility after urosepsis with exacerbation of baseline  vertebral-basilar insufficiency with IPR stay DC 09/28/23 .  Debility VBI (vertebrobasilar insufficiency) Vertigo  Patient doing extremely well from a recovery standpoint, is independent and back to baseline function.   Follow up as needed.

## 2023-10-23 NOTE — Patient Instructions (Signed)
 Follow up as needed

## 2023-10-30 ENCOUNTER — Ambulatory Visit: Admitting: Physical Therapy

## 2023-10-30 ENCOUNTER — Encounter: Payer: Self-pay | Admitting: Physical Therapy

## 2023-10-30 ENCOUNTER — Ambulatory Visit

## 2023-10-30 ENCOUNTER — Ambulatory Visit: Attending: Physician Assistant | Admitting: Occupational Therapy

## 2023-10-30 ENCOUNTER — Other Ambulatory Visit: Payer: Self-pay

## 2023-10-30 DIAGNOSIS — R2681 Unsteadiness on feet: Secondary | ICD-10-CM | POA: Insufficient documentation

## 2023-10-30 DIAGNOSIS — H02831 Dermatochalasis of right upper eyelid: Secondary | ICD-10-CM | POA: Diagnosis not present

## 2023-10-30 DIAGNOSIS — R41841 Cognitive communication deficit: Secondary | ICD-10-CM | POA: Diagnosis not present

## 2023-10-30 DIAGNOSIS — H25813 Combined forms of age-related cataract, bilateral: Secondary | ICD-10-CM | POA: Diagnosis not present

## 2023-10-30 DIAGNOSIS — R2689 Other abnormalities of gait and mobility: Secondary | ICD-10-CM | POA: Diagnosis not present

## 2023-10-30 DIAGNOSIS — H4322 Crystalline deposits in vitreous body, left eye: Secondary | ICD-10-CM | POA: Diagnosis not present

## 2023-10-30 DIAGNOSIS — R5381 Other malaise: Secondary | ICD-10-CM | POA: Insufficient documentation

## 2023-10-30 DIAGNOSIS — H02834 Dermatochalasis of left upper eyelid: Secondary | ICD-10-CM | POA: Diagnosis not present

## 2023-10-30 DIAGNOSIS — M6281 Muscle weakness (generalized): Secondary | ICD-10-CM | POA: Diagnosis not present

## 2023-10-30 DIAGNOSIS — H0264 Xanthelasma of left upper eyelid: Secondary | ICD-10-CM | POA: Diagnosis not present

## 2023-10-30 NOTE — Therapy (Signed)
 OUTPATIENT PHYSICAL THERAPY LOWER EXTREMITY EVALUATION   Patient Name: Brandon Flores MRN: [id number] DOB:1945-11-16, 78 y.o., male Today's Date: 10/30/2023  END OF SESSION:  PT End of Session - 10/30/23 0845     Visit Number 1    Number of Visits 16    Date for PT Re-Evaluation 12/25/23    Authorization Type Medicare    Progress Note Due on Visit 10    PT Start Time 0845    PT Stop Time 0925    PT Time Calculation (min) 40 min          Past Medical History:  Diagnosis Date   Cervical radiculopathy    Colon polyp    (2006- adenoma and tubulovillous adenoma; 2010-adenomas; repeat 201)-Dr. Vicci   Complex partial seizure disorder (HCC) 10/08/2014   COVID-19 2021   GERD (gastroesophageal reflux disease)    Gout    Hypercholesterolemia    Lateral epicondylitis    2002   Migraine    headaches   Ophthalmalgia    Dr. Roz   Orthodontics    Dr. Hughie   Partial epilepsy with impairment of consciousness (HCC)    Plantar fasciitis    Prostate cancer (HCC)    Seizure disorder (HCC)    Dr. Cleotilde ([facility])   Seizures [facility])    Traumatic incomplete tear of left rotator cuff 2024   UTI (urinary tract infection)    Vertebrobasilar insufficiency    Vertigo    Past Surgical History:  Procedure Laterality Date   BACK SURGERY     HEMORROIDECTOMY     NECK SURGERY     PROSTATE BIOPSY     Patient Active Problem List   Diagnosis Date Noted   Debility 09/23/2023   Sepsis (HCC) 09/19/2023   Acute cystitis with hematuria 09/19/2023   TIA (transient ischemic attack) 09/19/2023   Encephalomalacia 12/06/2021   Malignant neoplasm of prostate (HCC) 02/03/2020   Obesity (BMI 30-39.9) 08/31/2016   Chronic neck pain 08/31/2016   Cervical radiculopathy 05/03/2016   HLD (hyperlipidemia) 05/03/2016   GERD (gastroesophageal reflux disease) 04/25/2016   Hyperuricemia 04/25/2016   Low back pain without sciatica 04/25/2016   Degenerative arthritis of left knee 04/25/2016    Metatarsalgia of both feet 04/25/2016   Gout 04/25/2016   Migraine headache 04/25/2016   Nuclear sclerosis of both eyes 04/25/2016   History of colonic polyps 04/25/2016   VBI (vertebrobasilar insufficiency) 04/25/2016   Vertigo 03/10/2014   Generalized convulsive epilepsy (HCC) 11/12/2013   Choroidal nevus, right 10/12/2013   Vitreous floaters 10/12/2013    PCP: Hughie Sharper, MD  REFERRING PROVIDER: Hughie Sharper, MD  REFERRING DIAG: R53.81 (ICD-10-CM) - Debility  THERAPY DIAG:  Unsteadiness on feet  Muscle weakness (generalized)  Other abnormalities of gait and mobility  Rationale for Evaluation and Treatment: Rehabilitation  ONSET DATE: 09/28/23  SUBJECTIVE:   SUBJECTIVE STATEMENT: Pt reports no vertigo attacks since leaving the hospital. States that during these attacks he is very unsteady and has to keep his hands up to keep his balance. Pt states his balance is still an issue though and doesn't feel back to his normal. Has history of neuropathy that is also throwing his balance off. Pt reports he is moving slower -- can usually do stairs and walk faster than his wife but now he is keeping pace with her. Pt states he gets a lightheaded/floating head feeling. When he stands up and moves/steps forward he gets off balance. Will use a cane as needed (  keeps it in the car) -- will use it in grocery store and walking in the park.   PERTINENT HISTORY: history of left temporal hemorrhage with encephalomalacia, petit mall disorder, BPPV, vertebrobasilar insufficiency, prostate cancer, gait disorder who was admitted to hospital on 09/19/2023 with dizziness and fall with inability to get up.   Admitted to CIR 09/23/23 and discharged home 09/28/23  PAIN:  Are you having pain? No  PRECAUTIONS: Fall  RED FLAGS: None   WEIGHT BEARING RESTRICTIONS: No  FALLS:  Has patient fallen in last 6 months? No  LIVING ENVIRONMENT: Lives with: lives with their spouse Lives in:  House/apartment Stairs: 3 steps in the front, side door is flat entry, 14-16 stairs inside house (L side rail) goes down one step at a time Has following equipment at home: Single point cane, Quad cane small base, Quad cane large base, and Walker - 2 wheeled  OCCUPATION: Retired; watching TV most days and reading, tries to go out once a day and walk in the park  PLOF: Independent  PATIENT GOALS: Improve walking and balance  NEXT MD VISIT: n/a  OBJECTIVE:  Note: Objective measures were completed at Evaluation unless otherwise noted.  DIAGNOSTIC FINDINGS: 09/19/23 Brain MRI IMPRESSION: 1. No evidence of acute intracranial abnormality. 2. Sequela of remote hemorrhage in the anterior left temporal lobe  CTA head heck was negative for LVO and showed chronic R-VA occlusion or poor flow, moderate stenosis proximal BA, high grade stenosis R-ICA 65-70%, severe stenosis L-ACA origin, 50% stenosis L-CCA and L-ICA bulb and up to moderate stenosis distal left PCA, R-ACA A2 and L-MCA M1 segment. CT abdomen pelvis done revealing bladder wall thickening with pericystic edema suspicious for cystitis and hyperattenuation in dependent bladder favored to be early contrast secretion rather than new bladder stone. 2D echo showed EF 60-65% with mild LVH.   PATIENT SURVEYS:  PSFS: THE PATIENT SPECIFIC FUNCTIONAL SCALE  Place score of 0-10 (0 = unable to perform activity and 10 = able to perform activity at the same level as before injury or problem)  Activity Date: 10/30/23    Walking 7 (catches himself more to get his balance, shaky)    2.  Standing balance 5     3.     4.      Total Score 6      Total Score = Sum of activity scores/number of activities  Minimally Detectable Change: 3 points (for single activity); 2 points (for average score)  [facility] (nd). The Patient Specific Functional Scale . Retrieved from [email or web address]    COGNITION: Overall cognitive status: Within functional limits for tasks assessed     SENSATION: Neuropathy in bilat feet but can feel more in his arch  EDEMA:  None  MUSCLE LENGTH: Did not assess  PALPATION: Did not assess  LOWER EXTREMITY ROM: WNL  LOWER EXTREMITY MMT:  MMT Right eval Left eval  Hip flexion 4+ 4  Hip extension    Hip abduction 5 5  Hip adduction    Hip internal rotation    Hip external rotation    Knee flexion 4+ 4+  Knee extension 4+ 4+  Ankle dorsiflexion    Ankle plantarflexion    Ankle inversion    Ankle eversion     (Blank rows = not tested)  LOWER EXTREMITY SPECIAL TESTS:  Did not assess  FUNCTIONAL TESTS:  5 times sit to stand: 8.14 sec; LEs against bed for stability, hands on knees FGA: 21/30  [facility]  PT Assessment - 10/30/23 0001       Functional Gait  Assessment   Gait assessed  Yes    Gait Level Surface Walks 20 ft, slow speed, abnormal gait pattern, evidence for imbalance or deviates 10-15 in outside of the 12 in walkway width. Requires more than 7 sec to ambulate 20 ft.    Change in Gait Speed Able to change speed, demonstrates mild gait deviations, deviates 6-10 in outside of the 12 in walkway width, or no gait deviations, unable to achieve a major change in velocity, or uses a change in velocity, or uses an assistive device.    Gait with Horizontal Head Turns Performs head turns smoothly with slight change in gait velocity (eg, minor disruption to smooth gait path), deviates 6-10 in outside 12 in walkway width, or uses an assistive device.    Gait with Vertical Head Turns Performs head turns with no change in gait. Deviates no more than 6 in outside 12 in walkway width.    Gait and Pivot Turn Pivot turns safely within 3 sec and stops quickly with no loss of balance.    Step Over Obstacle Is able to step over 2 stacked shoe boxes taped together (9 in total height) without changing gait speed. No evidence of imbalance.    Gait with  Narrow Base of Support Ambulates 4-7 steps.    Gait with Eyes Closed Walks 20 ft, uses assistive device, slower speed, mild gait deviations, deviates 6-10 in outside 12 in walkway width. Ambulates 20 ft in less than 9 sec but greater than 7 sec.    Ambulating Backwards Walks 20 ft, uses assistive device, slower speed, mild gait deviations, deviates 6-10 in outside 12 in walkway width.    Steps Alternating feet, must use rail.    Total Score 21    FGA comment: 21/30           GAIT: Distance walked: Into clinic; at least 200' Assistive device utilized: None Level of assistance: Complete Independence Comments: Amb 10 meters in 10.26 sec = 0.97 m/s. Widened BOS, R foot externally rotated, slap foot R>L  VESTIBULAR ASSESSMENT:  GENERAL OBSERVATION: n/a   SYMPTOM BEHAVIOR:  Subjective history: Reports no attacks of imbalance and unsteadiness since leaving the hospital but general unsteadiness. Has had PT in the past for dizziness. Went to one clinic and got what sounds like canalith repositioning and then went to another clinic who performed primarily balance work.   Non-Vestibular symptoms: None  Type of dizziness: Imbalance (Disequilibrium), Unsteady with head/body turns, and Lightheadedness/Faint  Frequency: No attacks since leaving the hospital but still feels unsteady  Duration: none  Aggravating factors: No known aggravating factors  Relieving factors: no known relieving factors  Progression of symptoms: better  OCULOMOTOR EXAM:  Ocular Alignment: normal  Ocular ROM: No Limitations  Spontaneous Nystagmus: absent  Gaze-Induced Nystagmus: absent  Smooth Pursuits: intact  Saccades: intact   R eye has one black dot and L eye has multiple black dots and floaters but pt reports this is normal  FRENZEL - FIXATION SUPRESSED: Did not assesss  VESTIBULAR - OCULAR REFLEX:   Slow VOR: Normal  VOR Cancellation: Normal  Head-Impulse Test: HIT Right: negative HIT Left:  positive  Dynamic Visual Acuity: TBA   POSITIONAL TESTING:  Sidelying Dix-hallpike R: (-) -- some dizziness/unsteadiness with return to sitting Sidelying Dix-hallpike L: (-) -- some dizziness/unsteadiness with return to sitting   MOTION SENSITIVITY:    Motion Sensitivity Quotient Intensity: 0 =  none, 1 = Lightheaded, 2 = Mild, 3 = Moderate, 4 = Severe, 5 = Vomiting  Intensity  1. Sitting to supine   2. Supine to L side   3. Supine to R side   4. Supine to sitting   5. L Hallpike-Dix   6. Up from L    7. R Hallpike-Dix   8. Up from R    9. Sitting, head  tipped to L knee   10. Head up from L  knee   11. Sitting, head  tipped to R knee   12. Head up from R  knee   13. Sitting head turns x5   14.Sitting head nods x5   15. In stance, 180  turn to L    16. In stance, 180  turn to R     OTHOSTATICS: not done                                                                                                                              TREATMENT DATE: 10/30/23 Education provided only    PATIENT EDUCATION:  Education details: Exam findings, POC, initial HEP Person educated: Patient Education method: Programmer, multimedia, Demonstration, and Handouts Education comprehension: verbalized understanding, returned demonstration, and needs further education  HOME EXERCISE PROGRAM: Did not initiate  ASSESSMENT:  CLINICAL IMPRESSION: Patient is a 78 y.o. M who was seen today for physical therapy evaluation and treatment for unsteadiness and debility s/p hospitalization. PMH is significant for neuropathy, BPPV and dizziness. Pt recently admitted 09/19/23 for dizziness and fall in which they found sequela of remote hemorrhage in the anterior left temporal lobe, admitted to CIR 09/23/23 to 09/28/23. Assessment finds gross bilat LE weakness and decreased dynamic balance and dizziness affecting pt's normal activities. Pt will benefit from PT to address these issues for return to baseline function.    OBJECTIVE IMPAIRMENTS: Abnormal gait, decreased balance, decreased coordination, decreased endurance, decreased mobility, difficulty walking, decreased ROM, decreased strength, dizziness, impaired UE functional use, improper body mechanics, postural dysfunction, and pain.   ACTIVITY LIMITATIONS: lifting, bending, standing, squatting, stairs, transfers, bed mobility, locomotion level, and caring for others  PARTICIPATION LIMITATIONS: shopping, community activity, and yard work  PERSONAL FACTORS: Age, Fitness, Past/current experiences, and Time since onset of injury/illness/exacerbation are also affecting patient's functional outcome.   REHAB POTENTIAL: Good  CLINICAL DECISION MAKING: Evolving/moderate complexity  EVALUATION COMPLEXITY: Moderate   GOALS: Goals reviewed with patient? Yes  SHORT TERM GOALS: Target date: 11/27/2023  Pt will be ind with initial HEP Baseline: Goal status: INITIAL  2.  Pt will report improved dizziness by >/=25% Baseline:  Goal status: INITIAL   LONG TERM GOALS: Target date: 12/25/2023   Pt will be ind with management and progression of HEP Baseline:  Goal status: INITIAL  2.  Pt will have improved FGA to >/=28/30 for reduced fall risk Baseline: 21 Goal status: INITIAL  3.  Pt will have improved gait speed to >/=1.1  m/s to be considered high functioning Baseline: 0.97 m/s Goal status: INITIAL  4.  Pt will have improved PSFS average score to >/=8 to demo MCID Baseline: 6 Goal status: INITIAL  5.  Pt will report improved dizzy symptoms by >/=50% Baseline:  Goal status: INITIAL    PLAN:  PT FREQUENCY: 2x/week  PT DURATION: 8 weeks  PLANNED INTERVENTIONS: [id number]- PT Re-evaluation, 97750- Physical Performance Testing, 97110-Therapeutic exercises, 97530- Therapeutic activity, [id number]- Neuromuscular re-education, 97535- Self Care, [id number]- Manual therapy, [phone]- Gait training, [phone]- Canalith repositioning, [id number]- Aquatic Therapy, [phone]-  Electrical stimulation (unattended), 20560 (1-2 muscles), 20561 (3+ muscles)- Dry Needling, Patient/Family education, Balance training, Stair training, Taping, Joint mobilization, Spinal mobilization, Vestibular training, Cryotherapy, and Moist heat  PLAN FOR NEXT SESSION: May need to further assess mCTSIB, reactive balance, and dynamic visual acuity. Initiate strength, balance, gaze stabilization HEP.    Ruble Buttler April Ma L Saina Waage, PT, DPT 10/30/2023, 11:32 AM
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
## 2023-10-30 NOTE — Patient Instructions (Signed)

## 2023-10-30 NOTE — Therapy (Signed)
 OUTPATIENT OCCUPATIONAL THERAPY NEURO EVALUATION  Patient Name: Brandon Flores MRN: [id number] DOB:[birth year], 78 y.o., male Today's Date: 10/30/2023  PCP: Hughie Sharper, MD REFERRING PROVIDER: Pegge Toribio PARAS, PA-C  END OF SESSION:  OT End of Session - 10/30/23 0949     Visit Number 1    Number of Visits 1    Authorization Type Medicare A&B / AARP Supplemental 2025    OT Start Time 0934    OT Stop Time 1015    OT Time Calculation (min) 41 min          Past Medical History:  Diagnosis Date   Cervical radiculopathy    Colon polyp    (2006- adenoma and tubulovillous adenoma; 2010-adenomas; repeat 201)-Dr. Vicci   Complex partial seizure disorder (HCC) 10/08/2014   COVID-19 2021   GERD (gastroesophageal reflux disease)    Gout    Hypercholesterolemia    Lateral epicondylitis    2002   Migraine    headaches   Ophthalmalgia    Dr. Roz   Orthodontics    Dr. Hughie   Partial epilepsy with impairment of consciousness (HCC)    Plantar fasciitis    Prostate cancer (HCC)    Seizure disorder (HCC)    Dr. Cleotilde ([facility])   Seizures [facility])    Traumatic incomplete tear of left rotator cuff 2024   UTI (urinary tract infection)    Vertebrobasilar insufficiency    Vertigo    Past Surgical History:  Procedure Laterality Date   BACK SURGERY     HEMORROIDECTOMY     NECK SURGERY     PROSTATE BIOPSY     Patient Active Problem List   Diagnosis Date Noted   Debility 09/23/2023   Sepsis (HCC) 09/19/2023   Acute cystitis with hematuria 09/19/2023   TIA (transient ischemic attack) 09/19/2023   Encephalomalacia 12/06/2021   Malignant neoplasm of prostate (HCC) 02/03/2020   Obesity (BMI 30-39.9) 08/31/2016   Chronic neck pain 08/31/2016   Cervical radiculopathy 05/03/2016   HLD (hyperlipidemia) 05/03/2016   GERD (gastroesophageal reflux disease) 04/25/2016   Hyperuricemia 04/25/2016   Low back pain without sciatica 04/25/2016   Degenerative arthritis of left knee  04/25/2016   Metatarsalgia of both feet 04/25/2016   Gout 04/25/2016   Migraine headache 04/25/2016   Nuclear sclerosis of both eyes 04/25/2016   History of colonic polyps 04/25/2016   VBI (vertebrobasilar insufficiency) 04/25/2016   Vertigo 03/10/2014   Generalized convulsive epilepsy (HCC) 11/12/2013   Choroidal nevus, right 10/12/2013   Vitreous floaters 10/12/2013    ONSET DATE: referral 09/25/23  REFERRING DIAG: R53.81 (ICD-10-CM) - Debility  THERAPY DIAG:  Unsteadiness on feet  Rationale for Evaluation and Treatment: Rehabilitation  SUBJECTIVE:   SUBJECTIVE STATEMENT: Pt reports that his balance and equilibrium continues to be a challenge, pt attributes it partially to neuropathy in B feet.  Pt states that he gets dizzy sometimes, especially when trying to quickly take a step after sitting for a long time.  Pt reports having PT in the past for vertigo.  Pt reports no problems with bathing/dressing and has returned to driving.  Pt reports that he did have a rotator cuff injury beginning of June 2025, that he is rehabilitating on his own.   Pt accompanied by: self  PERTINENT HISTORY: admitted to hospital on 09/19/23 with dizziness and fall with inability to get up. He reported significant weakness and slurred speech during this episode and presented to ED where he was found to be  febrile with T-102, reported abominal pain and evidence of UTI. He was started on ceftriaxone  and blood cultures done positive for E coli and Enterobacterials w/sensitivities pending. Leucocytosis with low grade fevers and thrombocytopenia have resolved. Stress induced hyperglycemia noted with Hgb A1c- 5.0. MRI brain negative for acute changes and showed sequela of remote hemorrhage anterior left temporal lobe.functional   PMH HLD, hypothyroidism, prostate cancer, BPH, vertebrobasilar insufficiency syndrome, encephalomalacia.  PRECAUTIONS: Fall  WEIGHT BEARING RESTRICTIONS: No  PAIN:  Are you having  pain? No  FALLS: Has patient fallen in last 6 months? Yes. Number of falls 2, one instance of vertigo and then most recent fall 09/19/23, that led to hospitalization  LIVING ENVIRONMENT: Lives with: lives with their spouse Lives in: House/apartment Stairs: 3 steps to front entrance, pt reports coming to side entrance with level entry as primary entrance; 2 story home with 14 -16 steps to 2nd floor Has following equipment at home: Single point cane in the community  PLOF: Independent, Independent with basic ADLs, Independent with household mobility without device, and Independent with community mobility with device  PATIENT GOALS: improved balance  OBJECTIVE:  Note: Objective measures were completed at Evaluation unless otherwise noted.  HAND DOMINANCE: Right  ADLs: Overall ADLs: Pt reports that he has slowed down a bit to ensure that he does not slip or fall.   Transfers/ambulation related to ADLs: no AD Eating: Independent Grooming: Mod I -Independent UB Dressing: Mod I - Independent LB Dressing: Mod I - Independent Toileting: Mod I - Independent Bathing: Mod I, reports having a non-skid mat  Tub Shower transfers: Mod I Equipment: hand held shower head, shower stool (but does not use it)  IADLs: Light housekeeping: Mod I - Independent with washing dishes, vacuuming, cleaning bathroom, etc Meal Prep: wife does the cooking Community mobility: driving independently  Medication management: Mod I, uses medication Nature conservation officer: wife does the finances  MOBILITY STATUS: uses SPC when out in the community, reports that he has slowed down and will take ~1 minute once standing before walking due to hypotension/dizzy spells  POSTURE COMMENTS:  forward head  ACTIVITY TOLERANCE: Activity tolerance: reports WFL, does report that he tries to go a little slower to ensure not falling  FUNCTIONAL OUTCOME MEASURES: Box and blocks: Right: 60 blocks and Left: 60  blocks  UPPER EXTREMITY ROM:  WFL bilaterally, mild decreased L shoulder flexion  UPPER EXTREMITY MMT:   4+ to 5/5 bilaterally   COORDINATION: Box and Blocks:  Right 60 blocks, Left 60 blocks  SENSATION: WFL, reports neuropathy in B feet  COGNITION: Overall cognitive status: Within functional limits for tasks assessed  Recall: pt able to recall apple, pen, tie, house, and car with immediate recall  and able to recall 4/5 after 5 mins   VISION: Subjective report: reports floaters in L eye, plans to see eye dr this afternoon Baseline vision: Wears glasses for reading only  OBSERVATIONS: Friendly, verbose gentleman who reports that he is feeling fine. Independent ADLs, IADLS. He is driving confidently. Pt received memory strategies from SLP during evaluation and appreciative of them, reporting that he plans to get a recorder to help remember particular items.  TREATMENT DATE:  10/30/23 Educated on purpose and rationale for OT.  Pt reporting independent to Mod I with ADLs and IADLs.  He does report improved safety awareness and use of SPC when ambulating in the community and use of non-skid mat in bathroom and plan to have glass doors removed from shower to increase safety with transfers.     PATIENT EDUCATION: Education details: Educated on role and purpose of OT as well as potential interventions and goals for therapy based on initial evaluation findings. Person educated: Patient Education method: Explanation Education comprehension: verbalized understanding  HOME EXERCISE PROGRAM: N/A   GOALS: Goals reviewed with patient? Yes  SHORT TERM GOALS: Target date: 10/30/23  Pt will verbalize understanding of safety awareness and DME for improved safety with bathing. Baseline: Goal status: MET   ASSESSMENT:  CLINICAL IMPRESSION: Patient is a 78 y.o.  male who was seen today for occupational therapy evaluation for impairments and debility s/p hospitalization. Pt reporting that since home, he has resumed engagement in ADL, IADLs, and has returned to driving.  Pt reports no concerns with ADLs/IADLs, however continues to report decreased balance and occasional dizziness, attributing to neuropathy and blood pressure.  Pt with no further skilled occupational therapy needs at this time.   PERFORMANCE DEFICITS: in functional skills including balance and endurance, cognitive skills including memory.  CO-MORBIDITIES: may have co-morbidities  that affects occupational performance. Patient will benefit from skilled OT to address above impairments and improve overall function.  MODIFICATION OR ASSISTANCE TO COMPLETE EVALUATION: No modification of tasks or assist necessary to complete an evaluation.  OT OCCUPATIONAL PROFILE AND HISTORY: Problem focused assessment: Including review of records relating to presenting problem.  CLINICAL DECISION MAKING: LOW - limited treatment options, no task modification necessary  REHAB POTENTIAL: Excellent  EVALUATION COMPLEXITY: Low    PLAN:  OT FREQUENCY: one time visit  OT DURATION: other: Eval only  PLANNED INTERVENTIONS: 97535 self care/ADL training, patient/family education, and DME and/or AE instructions  RECOMMENDED OTHER SERVICES: NA  CONSULTED AND AGREED WITH PLAN OF CARE: Patient  PLAN FOR NEXT SESSION: NA, eval only   Janece Laidlaw, OTR/L 10/30/2023, 10:34 AM   [facility] at [facility] [address] [place], [place] [address] Phone # [phone] Fax # [phone]

## 2023-11-04 ENCOUNTER — Ambulatory Visit: Attending: Physician Assistant

## 2023-11-04 DIAGNOSIS — M6281 Muscle weakness (generalized): Secondary | ICD-10-CM | POA: Diagnosis not present

## 2023-11-04 DIAGNOSIS — R2681 Unsteadiness on feet: Secondary | ICD-10-CM | POA: Diagnosis not present

## 2023-11-04 DIAGNOSIS — R2689 Other abnormalities of gait and mobility: Secondary | ICD-10-CM | POA: Insufficient documentation

## 2023-11-04 NOTE — Therapy (Signed)
 OUTPATIENT PHYSICAL THERAPY LOWER EXTREMITY TREATMENT   Patient Name: Brandon Flores MRN: [id number] DOB:[birth year], 78 y.o., male Today's Date: 11/04/2023  END OF SESSION:  PT End of Session - 11/04/23 0842     Visit Number 2    Number of Visits 16    Date for PT Re-Evaluation 12/25/23    Authorization Type Medicare    Progress Note Due on Visit 10    PT Start Time 0845    PT Stop Time 0930    PT Time Calculation (min) 45 min          Past Medical History:  Diagnosis Date   Cervical radiculopathy    Colon polyp    (2006- adenoma and tubulovillous adenoma; 2010-adenomas; repeat 201)-Dr. Vicci   Complex partial seizure disorder (HCC) 10/08/2014   COVID-19 2021   GERD (gastroesophageal reflux disease)    Gout    Hypercholesterolemia    Lateral epicondylitis    2002   Migraine    headaches   Ophthalmalgia    Dr. Roz   Orthodontics    Dr. Hughie   Partial epilepsy with impairment of consciousness (HCC)    Plantar fasciitis    Prostate cancer (HCC)    Seizure disorder (HCC)    Dr. Cleotilde ([facility])   Seizures [facility])    Traumatic incomplete tear of left rotator cuff 2024   UTI (urinary tract infection)    Vertebrobasilar insufficiency    Vertigo    Past Surgical History:  Procedure Laterality Date   BACK SURGERY     HEMORROIDECTOMY     NECK SURGERY     PROSTATE BIOPSY     Patient Active Problem List   Diagnosis Date Noted   Debility 09/23/2023   Sepsis (HCC) 09/19/2023   Acute cystitis with hematuria 09/19/2023   TIA (transient ischemic attack) 09/19/2023   Encephalomalacia 12/06/2021   Malignant neoplasm of prostate (HCC) 02/03/2020   Obesity (BMI 30-39.9) 08/31/2016   Chronic neck pain 08/31/2016   Cervical radiculopathy 05/03/2016   HLD (hyperlipidemia) 05/03/2016   GERD (gastroesophageal reflux disease) 04/25/2016   Hyperuricemia 04/25/2016   Low back pain without sciatica 04/25/2016   Degenerative arthritis of left knee 04/25/2016    Metatarsalgia of both feet 04/25/2016   Gout 04/25/2016   Migraine headache 04/25/2016   Nuclear sclerosis of both eyes 04/25/2016   History of colonic polyps 04/25/2016   VBI (vertebrobasilar insufficiency) 04/25/2016   Vertigo 03/10/2014   Generalized convulsive epilepsy (HCC) 11/12/2013   Choroidal nevus, right 10/12/2013   Vitreous floaters 10/12/2013    PCP: Hughie Sharper, MD  REFERRING PROVIDER: Hughie Sharper, MD  REFERRING DIAG: R53.81 (ICD-10-CM) - Debility  THERAPY DIAG:  Unsteadiness on feet  Muscle weakness (generalized)  Other abnormalities of gait and mobility  Rationale for Evaluation and Treatment: Rehabilitation  ONSET DATE: 09/28/23  SUBJECTIVE:   SUBJECTIVE STATEMENT: Getting brief episodes of dizziness/lightheadedness when arising from sit to stand.  Was able to mow the lawn yesterday with push mower and only issue was fatigue after 30 min. In the habit of checking BP most days and seems to be back to normal  PERTINENT HISTORY: history of left temporal hemorrhage with encephalomalacia, petit mall disorder, BPPV, vertebrobasilar insufficiency, prostate cancer, gait disorder who was admitted to hospital on 09/19/2023 with dizziness and fall with inability to get up.   Admitted to CIR 09/23/23 and discharged home 09/28/23  PAIN:  Are you having pain? No  PRECAUTIONS: Fall  RED FLAGS: None  WEIGHT BEARING RESTRICTIONS: No  FALLS:  Has patient fallen in last 6 months? No  LIVING ENVIRONMENT: Lives with: lives with their spouse Lives in: House/apartment Stairs: 3 steps in the front, side door is flat entry, 14-16 stairs inside house (L side rail) goes down one step at a time Has following equipment at home: Single point cane, Quad cane small base, Quad cane large base, and Walker - 2 wheeled  OCCUPATION: Retired; watching TV most days and reading, tries to go out once a day and walk in the park  PLOF: Independent  PATIENT GOALS: Improve  walking and balance  NEXT MD VISIT: n/a  OBJECTIVE:   TODAY'S TREATMENT: 11/04/23 Activity Comments  M-CTSIB See below  Sit to stand w/ march 1x10 5# goblet  Feet together eyes closed w/ bicep curls 1x10 5#  Bent over row 1x10 5#, 8#  Feet together eyes closed head movements x 5 reps         M-CTSIB  Condition 1: Firm Surface, EO 30 Sec, Normal Sway  Condition 2: Firm Surface, EC 30 Sec, Mild Sway  Condition 3: Foam Surface, EO 30 Sec, Mild Sway  Condition 4: Foam Surface, EC 20 Sec, Moderate and Severe Sway   PATIENT EDUCATION:  Education details: Exam findings, POC, initial HEP Person educated: Patient Education method: Explanation, Demonstration, and Handouts Education comprehension: verbalized understanding, returned demonstration, and needs further education  HOME EXERCISE PROGRAM: Access Code: [email or web address] URL: [email or web address] Date: 11/04/2023 Prepared by: Burnard Sandifer  Exercises - Resisted Sit-to-Stand With Dumbbell at Chest  - 1 x daily - 7 x weekly - 3 sets - 10 reps - Corner Balance Feet Together With Eyes Closed  - 1 x daily - 7 x weekly - 3 sets - 10 reps - Standing Bent Over Single Arm Scapular Row with Table Support  - 1 x daily - 7 x weekly - 3 sets - 10 reps - Corner Balance Feet Together: Eyes Closed With Head Turns  - 1 x daily - 7 x weekly - 1 sets - 5 reps   Note: Objective measures were completed at Evaluation unless otherwise noted.  DIAGNOSTIC FINDINGS: 09/19/23 Brain MRI IMPRESSION: 1. No evidence of acute intracranial abnormality. 2. Sequela of remote hemorrhage in the anterior left temporal lobe  CTA head heck was negative for LVO and showed chronic R-VA occlusion or poor flow, moderate stenosis proximal BA, high grade stenosis R-ICA 65-70%, severe stenosis L-ACA origin, 50% stenosis L-CCA and L-ICA bulb and up to moderate stenosis distal left PCA, R-ACA A2 and L-MCA M1 segment. CT abdomen pelvis done revealing bladder wall  thickening with pericystic edema suspicious for cystitis and hyperattenuation in dependent bladder favored to be early contrast secretion rather than new bladder stone. 2D echo showed EF 60-65% with mild LVH.   PATIENT SURVEYS:  PSFS: THE PATIENT SPECIFIC FUNCTIONAL SCALE  Place score of 0-10 (0 = unable to perform activity and 10 = able to perform activity at the same level as before injury or problem)  Activity Date: 10/30/23    Walking 7 (catches himself more to get his balance, shaky)    2.  Standing balance 5     3.     4.      Total Score 6      Total Score = Sum of activity scores/number of activities  Minimally Detectable Change: 3 points (for single activity); 2 points (for average score)  [facility] (nd). The Patient Specific Functional Scale .  Retrieved from [email or web address]   COGNITION: Overall cognitive status: Within functional limits for tasks assessed     SENSATION: Neuropathy in bilat feet but can feel more in his arch  EDEMA:  None  MUSCLE LENGTH: Did not assess  PALPATION: Did not assess  LOWER EXTREMITY ROM: WNL  LOWER EXTREMITY MMT:  MMT Right eval Left eval  Hip flexion 4+ 4  Hip extension    Hip abduction 5 5  Hip adduction    Hip internal rotation    Hip external rotation    Knee flexion 4+ 4+  Knee extension 4+ 4+  Ankle dorsiflexion    Ankle plantarflexion    Ankle inversion    Ankle eversion     (Blank rows = not tested)  LOWER EXTREMITY SPECIAL TESTS:  Did not assess  FUNCTIONAL TESTS:  5 times sit to stand: 8.14 sec; LEs against bed for stability, hands on knees FGA: 21/30     GAIT: Distance walked: Into clinic; at least 200' Assistive device utilized: None Level of assistance: Complete Independence Comments: Amb 10 meters in 10.26 sec = 0.97 m/s. Widened BOS, R foot externally rotated, slap foot R>L  VESTIBULAR ASSESSMENT:  GENERAL OBSERVATION:  n/a   SYMPTOM BEHAVIOR:  Subjective history: Reports no attacks of imbalance and unsteadiness since leaving the hospital but general unsteadiness. Has had PT in the past for dizziness. Went to one clinic and got what sounds like canalith repositioning and then went to another clinic who performed primarily balance work.   Non-Vestibular symptoms: None  Type of dizziness: Imbalance (Disequilibrium), Unsteady with head/body turns, and Lightheadedness/Faint  Frequency: No attacks since leaving the hospital but still feels unsteady  Duration: none  Aggravating factors: No known aggravating factors  Relieving factors: no known relieving factors  Progression of symptoms: better  OCULOMOTOR EXAM:  Ocular Alignment: normal  Ocular ROM: No Limitations  Spontaneous Nystagmus: absent  Gaze-Induced Nystagmus: absent  Smooth Pursuits: intact  Saccades: intact   R eye has one black dot and L eye has multiple black dots and floaters but pt reports this is normal  FRENZEL - FIXATION SUPRESSED: Did not assesss  VESTIBULAR - OCULAR REFLEX:   Slow VOR: Normal  VOR Cancellation: Normal  Head-Impulse Test: HIT Right: negative HIT Left: positive  Dynamic Visual Acuity: TBA   POSITIONAL TESTING:  Sidelying Dix-hallpike R: (-) -- some dizziness/unsteadiness with return to sitting Sidelying Dix-hallpike L: (-) -- some dizziness/unsteadiness with return to sitting   MOTION SENSITIVITY:    Motion Sensitivity Quotient Intensity: 0 = none, 1 = Lightheaded, 2 = Mild, 3 = Moderate, 4 = Severe, 5 = Vomiting  Intensity  1. Sitting to supine   2. Supine to L side   3. Supine to R side   4. Supine to sitting   5. L Hallpike-Dix   6. Up from L    7. R Hallpike-Dix   8. Up from R    9. Sitting, head  tipped to L knee   10. Head up from L  knee   11. Sitting, head  tipped to R knee   12. Head up from R  knee   13. Sitting head turns x5   14.Sitting head nods x5   15. In stance, 180  turn to  L    16. In stance, 180  turn to R     OTHOSTATICS: not done  TREATMENT DATE: 10/30/23 Education provided only      ASSESSMENT:  CLINICAL IMPRESSION: Postural control deficits w/ M-CTSIB w/ LOB under condition 4.  Instructed in techniques to improve static balance and strength for improve mobility and postural control.  Initiated HEP development on this day. Continued sessions to progress HEP and return to gym activities.   OBJECTIVE IMPAIRMENTS: Abnormal gait, decreased balance, decreased coordination, decreased endurance, decreased mobility, difficulty walking, decreased ROM, decreased strength, dizziness, impaired UE functional use, improper body mechanics, postural dysfunction, and pain.   ACTIVITY LIMITATIONS: lifting, bending, standing, squatting, stairs, transfers, bed mobility, locomotion level, and caring for others  PARTICIPATION LIMITATIONS: shopping, community activity, and yard work  PERSONAL FACTORS: Age, Fitness, Past/current experiences, and Time since onset of injury/illness/exacerbation are also affecting patient's functional outcome.   REHAB POTENTIAL: Good  CLINICAL DECISION MAKING: Evolving/moderate complexity  EVALUATION COMPLEXITY: Moderate   GOALS: Goals reviewed with patient? Yes  SHORT TERM GOALS: Target date: 11/27/2023  Pt will be ind with initial HEP Baseline: Goal status: INITIAL  2.  Pt will report improved dizziness by >/=25% Baseline:  Goal status: INITIAL   LONG TERM GOALS: Target date: 12/25/2023   Pt will be ind with management and progression of HEP Baseline:  Goal status: INITIAL  2.  Pt will have improved FGA to >/=28/30 for reduced fall risk Baseline: 21 Goal status: INITIAL  3.  Pt will have improved gait speed to >/=1.1 m/s to be considered high functioning Baseline: 0.97 m/s Goal status:  INITIAL  4.  Pt will have improved PSFS average score to >/=8 to demo MCID Baseline: 6 Goal status: INITIAL  5.  Pt will report improved dizzy symptoms by >/=50% Baseline:  Goal status: INITIAL    PLAN:  PT FREQUENCY: 2x/week  PT DURATION: 8 weeks  PLANNED INTERVENTIONS: [id number]- PT Re-evaluation, 97750- Physical Performance Testing, 97110-Therapeutic exercises, 97530- Therapeutic activity, [id number]- Neuromuscular re-education, 97535- Self Care, [id number]- Manual therapy, [id number]- Gait training, [id number]- Canalith repositioning, [id number]- Aquatic Therapy, [phone]- Electrical stimulation (unattended), 20560 (1-2 muscles), 20561 (3+ muscles)- Dry Needling, Patient/Family education, Balance training, Stair training, Taping, Joint mobilization, Spinal mobilization, Vestibular training, Cryotherapy, and Moist heat  PLAN FOR NEXT SESSION: reactive balance, and dynamic visual acuity. Initiate strength, balance, gaze stabilization HEP.    Ailynn Gow K Florencia Zaccaro, PT, DPT 11/04/2023, 8:42 AM

## 2023-11-06 ENCOUNTER — Encounter: Payer: Self-pay | Admitting: Physical Therapy

## 2023-11-06 ENCOUNTER — Ambulatory Visit: Admitting: Physical Therapy

## 2023-11-06 DIAGNOSIS — R2681 Unsteadiness on feet: Secondary | ICD-10-CM

## 2023-11-06 DIAGNOSIS — R2689 Other abnormalities of gait and mobility: Secondary | ICD-10-CM

## 2023-11-06 DIAGNOSIS — M6281 Muscle weakness (generalized): Secondary | ICD-10-CM | POA: Diagnosis not present

## 2023-11-06 NOTE — Therapy (Signed)
 OUTPATIENT PHYSICAL THERAPY LOWER EXTREMITY TREATMENT   Patient Name: Brandon Flores MRN: [id number] DOB:[birth year], 78 y.o., male Today's Date: 11/06/2023  END OF SESSION:  PT End of Session - 11/06/23 0840     Visit Number 3    Number of Visits 16    Date for PT Re-Evaluation 12/25/23    Authorization Type Medicare    Progress Note Due on Visit 10    PT Start Time 0845    PT Stop Time 0925    PT Time Calculation (min) 40 min    Activity Tolerance Patient tolerated treatment well          Past Medical History:  Diagnosis Date   Cervical radiculopathy    Colon polyp    (2006- adenoma and tubulovillous adenoma; 2010-adenomas; repeat 201)-Dr. Vicci   Complex partial seizure disorder (HCC) 10/08/2014   COVID-19 2021   GERD (gastroesophageal reflux disease)    Gout    Hypercholesterolemia    Lateral epicondylitis    2002   Migraine    headaches   Ophthalmalgia    Dr. Roz   Orthodontics    Dr. Hughie   Partial epilepsy with impairment of consciousness (HCC)    Plantar fasciitis    Prostate cancer (HCC)    Seizure disorder (HCC)    Dr. Cleotilde ([facility])   Seizures [facility])    Traumatic incomplete tear of left rotator cuff 2024   UTI (urinary tract infection)    Vertebrobasilar insufficiency    Vertigo    Past Surgical History:  Procedure Laterality Date   BACK SURGERY     HEMORROIDECTOMY     NECK SURGERY     PROSTATE BIOPSY     Patient Active Problem List   Diagnosis Date Noted   Debility 09/23/2023   Sepsis (HCC) 09/19/2023   Acute cystitis with hematuria 09/19/2023   TIA (transient ischemic attack) 09/19/2023   Encephalomalacia 12/06/2021   Malignant neoplasm of prostate (HCC) 02/03/2020   Obesity (BMI 30-39.9) 08/31/2016   Chronic neck pain 08/31/2016   Cervical radiculopathy 05/03/2016   HLD (hyperlipidemia) 05/03/2016   GERD (gastroesophageal reflux disease) 04/25/2016   Hyperuricemia 04/25/2016   Low back pain without sciatica 04/25/2016    Degenerative arthritis of left knee 04/25/2016   Metatarsalgia of both feet 04/25/2016   Gout 04/25/2016   Migraine headache 04/25/2016   Nuclear sclerosis of both eyes 04/25/2016   History of colonic polyps 04/25/2016   VBI (vertebrobasilar insufficiency) 04/25/2016   Vertigo 03/10/2014   Generalized convulsive epilepsy (HCC) 11/12/2013   Choroidal nevus, right 10/12/2013   Vitreous floaters 10/12/2013    PCP: Hughie Sharper, MD  REFERRING PROVIDER: Hughie Sharper, MD  REFERRING DIAG: R53.81 (ICD-10-CM) - Debility  THERAPY DIAG:  Unsteadiness on feet  Muscle weakness (generalized)  Other abnormalities of gait and mobility  Rationale for Evaluation and Treatment: Rehabilitation  ONSET DATE: 09/28/23  SUBJECTIVE:   SUBJECTIVE STATEMENT: Pt states he went back to the gym on Monday and Tuesday. Did Nustep for 15 min. Feels sore in his hips and back of his arms.   PERTINENT HISTORY: history of left temporal hemorrhage with encephalomalacia, petit mall disorder, BPPV, vertebrobasilar insufficiency, prostate cancer, gait disorder who was admitted to hospital on 09/19/2023 with dizziness and fall with inability to get up.   Admitted to CIR 09/23/23 and discharged home 09/28/23  PAIN:  Are you having pain? No  PRECAUTIONS: Fall  RED FLAGS: None   WEIGHT BEARING RESTRICTIONS: No  FALLS:  Has patient fallen in last 6 months? No  LIVING ENVIRONMENT: Lives with: lives with their spouse Lives in: House/apartment Stairs: 3 steps in the front, side door is flat entry, 14-16 stairs inside house (L side rail) goes down one step at a time Has following equipment at home: Single point cane, Quad cane small base, Quad cane large base, and Walker - 2 wheeled  OCCUPATION: Retired; watching TV most days and reading, tries to go out once a day and walk in the park  PLOF: Independent  PATIENT GOALS: Improve walking and balance  NEXT MD VISIT: n/a  OBJECTIVE:   TODAY'S  TREATMENT: 11/06/23 Activity Comments  Nustep L5 x 8 min UEs/LEs  Standing on airex: Feet apart EO x30 Feet together EO x30 Feet together EO VOR x1 horizontal and vertical 2x 30 each Vestibular ball CW & CCW 2x30 In // bars  Step over hurdles with 8#: Forward, side stepping No weight with backwards stepping In // bars               PATIENT EDUCATION:  Education details: Exam findings, POC, initial HEP Person educated: Patient Education method: Explanation, Demonstration, and Handouts Education comprehension: verbalized understanding, returned demonstration, and needs further education  HOME EXERCISE PROGRAM: Access Code: [email or web address] URL: [email or web address] Date: 11/04/2023 Prepared by: Burnard Sandifer  Exercises - Resisted Sit-to-Stand With Dumbbell at Chest  - 1 x daily - 7 x weekly - 3 sets - 10 reps - Corner Balance Feet Together With Eyes Closed  - 1 x daily - 7 x weekly - 3 sets - 10 reps - Standing Bent Over Single Arm Scapular Row with Table Support  - 1 x daily - 7 x weekly - 3 sets - 10 reps - Corner Balance Feet Together: Eyes Closed With Head Turns  - 1 x daily - 7 x weekly - 1 sets - 5 reps   Note: Objective measures were completed at Evaluation unless otherwise noted.  DIAGNOSTIC FINDINGS: 09/19/23 Brain MRI IMPRESSION: 1. No evidence of acute intracranial abnormality. 2. Sequela of remote hemorrhage in the anterior left temporal lobe  CTA head heck was negative for LVO and showed chronic R-VA occlusion or poor flow, moderate stenosis proximal BA, high grade stenosis R-ICA 65-70%, severe stenosis L-ACA origin, 50% stenosis L-CCA and L-ICA bulb and up to moderate stenosis distal left PCA, R-ACA A2 and L-MCA M1 segment. CT abdomen pelvis done revealing bladder wall thickening with pericystic edema suspicious for cystitis and hyperattenuation in dependent bladder favored to be early contrast secretion rather than new bladder stone. 2D echo showed EF  60-65% with mild LVH.   PATIENT SURVEYS:  PSFS: THE PATIENT SPECIFIC FUNCTIONAL SCALE  Place score of 0-10 (0 = unable to perform activity and 10 = able to perform activity at the same level as before injury or problem)  Activity Date: 10/30/23    Walking 7 (catches himself more to get his balance, shaky)    2.  Standing balance 5     3.     4.      Total Score 6      Total Score = Sum of activity scores/number of activities  Minimally Detectable Change: 3 points (for single activity); 2 points (for average score)  [facility] (nd). The Patient Specific Functional Scale . Retrieved from [email or web address]   COGNITION: Overall cognitive status: Within functional limits for tasks assessed     SENSATION: Neuropathy in bilat feet but can feel more in his  arch  EDEMA:  None  MUSCLE LENGTH: Did not assess  PALPATION: Did not assess  LOWER EXTREMITY ROM: WNL  LOWER EXTREMITY MMT:  MMT Right eval Left eval  Hip flexion 4+ 4  Hip extension    Hip abduction 5 5  Hip adduction    Hip internal rotation    Hip external rotation    Knee flexion 4+ 4+  Knee extension 4+ 4+  Ankle dorsiflexion    Ankle plantarflexion    Ankle inversion    Ankle eversion     (Blank rows = not tested)  LOWER EXTREMITY SPECIAL TESTS:  Did not assess  FUNCTIONAL TESTS:  5 times sit to stand: 8.14 sec; LEs against bed for stability, hands on knees FGA: 21/30  M-CTSIB  Condition 1: Firm Surface, EO 30 Sec, Normal Sway  Condition 2: Firm Surface, EC 30 Sec, Mild Sway  Condition 3: Foam Surface, EO 30 Sec, Mild Sway  Condition 4: Foam Surface, EC 20 Sec, Moderate and Severe Sway      GAIT: Distance walked: Into clinic; at least 200' Assistive device utilized: None Level of assistance: Complete Independence Comments: Amb 10 meters in 10.26 sec = 0.97 m/s. Widened BOS, R foot externally rotated, slap foot  R>L  VESTIBULAR ASSESSMENT:  GENERAL OBSERVATION: n/a   SYMPTOM BEHAVIOR:  Subjective history: Reports no attacks of imbalance and unsteadiness since leaving the hospital but general unsteadiness. Has had PT in the past for dizziness. Went to one clinic and got what sounds like canalith repositioning and then went to another clinic who performed primarily balance work.   Non-Vestibular symptoms: None  Type of dizziness: Imbalance (Disequilibrium), Unsteady with head/body turns, and Lightheadedness/Faint  Frequency: No attacks since leaving the hospital but still feels unsteady  Duration: none  Aggravating factors: No known aggravating factors  Relieving factors: no known relieving factors  Progression of symptoms: better  OCULOMOTOR EXAM:  Ocular Alignment: normal  Ocular ROM: No Limitations  Spontaneous Nystagmus: absent  Gaze-Induced Nystagmus: absent  Smooth Pursuits: intact  Saccades: intact   R eye has one black dot and L eye has multiple black dots and floaters but pt reports this is normal  FRENZEL - FIXATION SUPRESSED: Did not assesss  VESTIBULAR - OCULAR REFLEX:   Slow VOR: Normal  VOR Cancellation: Normal  Head-Impulse Test: HIT Right: negative HIT Left: positive  Dynamic Visual Acuity: TBA   POSITIONAL TESTING:  Sidelying Dix-hallpike R: (-) -- some dizziness/unsteadiness with return to sitting Sidelying Dix-hallpike L: (-) -- some dizziness/unsteadiness with return to sitting   MOTION SENSITIVITY: not performed   OTHOSTATICS: not done                                                                                                                               ASSESSMENT:  CLINICAL IMPRESSION: Worked on balance on foam today. Some difficulty with gaze stability especially in horizontal plane. Worked on dynamic balance with obstacle  negotiation. Can feel more hip soreness and more challenged with backwards stepping.  OBJECTIVE IMPAIRMENTS: Abnormal gait,  decreased balance, decreased coordination, decreased endurance, decreased mobility, difficulty walking, decreased ROM, decreased strength, dizziness, impaired UE functional use, improper body mechanics, postural dysfunction, and pain.   ACTIVITY LIMITATIONS: lifting, bending, standing, squatting, stairs, transfers, bed mobility, locomotion level, and caring for others  PARTICIPATION LIMITATIONS: shopping, community activity, and yard work  PERSONAL FACTORS: Age, Fitness, Past/current experiences, and Time since onset of injury/illness/exacerbation are also affecting patient's functional outcome.   REHAB POTENTIAL: Good  CLINICAL DECISION MAKING: Evolving/moderate complexity  EVALUATION COMPLEXITY: Moderate   GOALS: Goals reviewed with patient? Yes  SHORT TERM GOALS: Target date: 11/27/2023  Pt will be ind with initial HEP Baseline: Goal status: INITIAL  2.  Pt will report improved dizziness by >/=25% Baseline:  Goal status: INITIAL   LONG TERM GOALS: Target date: 12/25/2023   Pt will be ind with management and progression of HEP Baseline:  Goal status: INITIAL  2.  Pt will have improved FGA to >/=28/30 for reduced fall risk Baseline: 21 Goal status: INITIAL  3.  Pt will have improved gait speed to >/=1.1 m/s to be considered high functioning Baseline: 0.97 m/s Goal status: INITIAL  4.  Pt will have improved PSFS average score to >/=8 to demo MCID Baseline: 6 Goal status: INITIAL  5.  Pt will report improved dizzy symptoms by >/=50% Baseline:  Goal status: INITIAL    PLAN:  PT FREQUENCY: 2x/week  PT DURATION: 8 weeks  PLANNED INTERVENTIONS: 97164- PT Re-evaluation, 97750- Physical Performance Testing, 97110-Therapeutic exercises, 97530- Therapeutic activity, [id number]- Neuromuscular re-education, 97535- Self Care, 02859- Manual therapy, [id number]- Gait training, [phone]- Canalith repositioning, [id number]- Aquatic Therapy, [phone]- Electrical stimulation (unattended), 20560  (1-2 muscles), 20561 (3+ muscles)- Dry Needling, Patient/Family education, Balance training, Stair training, Taping, Joint mobilization, Spinal mobilization, Vestibular training, Cryotherapy, and Moist heat  PLAN FOR NEXT SESSION: reactive balance, and dynamic visual acuity. Initiate strength, balance, gaze stabilization HEP.    Maggy Wyble April Ma L Audria Takeshita, PT, DPT 11/06/2023, 8:41 AM

## 2023-11-11 ENCOUNTER — Ambulatory Visit: Admitting: Physical Therapy

## 2023-11-11 ENCOUNTER — Other Ambulatory Visit (HOSPITAL_COMMUNITY): Payer: Self-pay

## 2023-11-11 NOTE — Therapy (Incomplete)
 OUTPATIENT PHYSICAL THERAPY LOWER EXTREMITY TREATMENT   Patient Name: Brandon Flores MRN: [id number] DOB:11/16/1945, 78 y.o., male Today's Date: 11/11/2023  END OF SESSION:    Past Medical History:  Diagnosis Date   Cervical radiculopathy    Colon polyp    (2006- adenoma and tubulovillous adenoma; 2010-adenomas; repeat 201)-Dr. Vicci   Complex partial seizure disorder (HCC) 10/08/2014   COVID-19 2021   GERD (gastroesophageal reflux disease)    Gout    Hypercholesterolemia    Lateral epicondylitis    2002   Migraine    headaches   Ophthalmalgia    Dr. Roz   Orthodontics    Dr. Hughie   Partial epilepsy with impairment of consciousness (HCC)    Plantar fasciitis    Prostate cancer (HCC)    Seizure disorder (HCC)    Dr. Cleotilde ([facility])   Seizures [facility])    Traumatic incomplete tear of left rotator cuff 2024   UTI (urinary tract infection)    Vertebrobasilar insufficiency    Vertigo    Past Surgical History:  Procedure Laterality Date   BACK SURGERY     HEMORROIDECTOMY     NECK SURGERY     PROSTATE BIOPSY     Patient Active Problem List   Diagnosis Date Noted   Debility 09/23/2023   Sepsis (HCC) 09/19/2023   Acute cystitis with hematuria 09/19/2023   TIA (transient ischemic attack) 09/19/2023   Encephalomalacia 12/06/2021   Malignant neoplasm of prostate (HCC) 02/03/2020   Obesity (BMI 30-39.9) 08/31/2016   Chronic neck pain 08/31/2016   Cervical radiculopathy 05/03/2016   HLD (hyperlipidemia) 05/03/2016   GERD (gastroesophageal reflux disease) 04/25/2016   Hyperuricemia 04/25/2016   Low back pain without sciatica 04/25/2016   Degenerative arthritis of left knee 04/25/2016   Metatarsalgia of both feet 04/25/2016   Gout 04/25/2016   Migraine headache 04/25/2016   Nuclear sclerosis of both eyes 04/25/2016   History of colonic polyps 04/25/2016   VBI (vertebrobasilar insufficiency) 04/25/2016   Vertigo 03/10/2014   Generalized convulsive epilepsy (HCC)  11/12/2013   Choroidal nevus, right 10/12/2013   Vitreous floaters 10/12/2013    PCP: Hughie Sharper, MD  REFERRING PROVIDER: Hughie Sharper, MD  REFERRING DIAG: R53.81 (ICD-10-CM) - Debility  THERAPY DIAG:  No diagnosis found.  Rationale for Evaluation and Treatment: Rehabilitation  ONSET DATE: 09/28/23  SUBJECTIVE:   SUBJECTIVE STATEMENT: ***Pt states he went back to the gym on Monday and Tuesday. Did Nustep for 15 min. Feels sore in his hips and back of his arms.   PERTINENT HISTORY: history of left temporal hemorrhage with encephalomalacia, petit mall disorder, BPPV, vertebrobasilar insufficiency, prostate cancer, gait disorder who was admitted to hospital on 09/19/2023 with dizziness and fall with inability to get up.   Admitted to CIR 09/23/23 and discharged home 09/28/23  PAIN:  Are you having pain? No  PRECAUTIONS: Fall  RED FLAGS: None   WEIGHT BEARING RESTRICTIONS: No  FALLS:  Has patient fallen in last 6 months? No  LIVING ENVIRONMENT: Lives with: lives with their spouse Lives in: House/apartment Stairs: 3 steps in the front, side door is flat entry, 14-16 stairs inside house (L side rail) goes down one step at a time Has following equipment at home: Single point cane, Quad cane small base, Quad cane large base, and Walker - 2 wheeled  OCCUPATION: Retired; watching TV most days and reading, tries to go out once a day and walk in the park  PLOF: Independent  PATIENT GOALS:  Improve walking and balance  NEXT MD VISIT: n/a  OBJECTIVE:    TODAY'S TREATMENT: 11/11/2023 Activity Comments                       TODAY'S TREATMENT: 11/06/23 Activity Comments  Nustep L5 x 8 min UEs/LEs  Standing on airex: Feet apart EO x30 Feet together EO x30 Feet together EO VOR x1 horizontal and vertical 2x 30 each Vestibular ball CW & CCW 2x30 In // bars  Step over hurdles with 8#: Forward, side stepping No weight with backwards stepping In // bars                PATIENT EDUCATION:  Education details: Exam findings, POC, initial HEP Person educated: Patient Education method: Explanation, Demonstration, and Handouts Education comprehension: verbalized understanding, returned demonstration, and needs further education  HOME EXERCISE PROGRAM: Access Code: [email or web address] URL: [email or web address] Date: 11/04/2023 Prepared by: Burnard Sandifer  Exercises - Resisted Sit-to-Stand With Dumbbell at Chest  - 1 x daily - 7 x weekly - 3 sets - 10 reps - Corner Balance Feet Together With Eyes Closed  - 1 x daily - 7 x weekly - 3 sets - 10 reps - Standing Bent Over Single Arm Scapular Row with Table Support  - 1 x daily - 7 x weekly - 3 sets - 10 reps - Corner Balance Feet Together: Eyes Closed With Head Turns  - 1 x daily - 7 x weekly - 1 sets - 5 reps   Note: Objective measures were completed at Evaluation unless otherwise noted.  DIAGNOSTIC FINDINGS: 09/19/23 Brain MRI IMPRESSION: 1. No evidence of acute intracranial abnormality. 2. Sequela of remote hemorrhage in the anterior left temporal lobe  CTA head heck was negative for LVO and showed chronic R-VA occlusion or poor flow, moderate stenosis proximal BA, high grade stenosis R-ICA 65-70%, severe stenosis L-ACA origin, 50% stenosis L-CCA and L-ICA bulb and up to moderate stenosis distal left PCA, R-ACA A2 and L-MCA M1 segment. CT abdomen pelvis done revealing bladder wall thickening with pericystic edema suspicious for cystitis and hyperattenuation in dependent bladder favored to be early contrast secretion rather than new bladder stone. 2D echo showed EF 60-65% with mild LVH.   PATIENT SURVEYS:  PSFS: THE PATIENT SPECIFIC FUNCTIONAL SCALE  Place score of 0-10 (0 = unable to perform activity and 10 = able to perform activity at the same level as before injury or problem)  Activity Date: 10/30/23    Walking 7 (catches himself more to get his balance, shaky)    2.  Standing balance 5      3.     4.      Total Score 6      Total Score = Sum of activity scores/number of activities  Minimally Detectable Change: 3 points (for single activity); 2 points (for average score)  [facility] (nd). The Patient Specific Functional Scale . Retrieved from [email or web address]   COGNITION: Overall cognitive status: Within functional limits for tasks assessed     SENSATION: Neuropathy in bilat feet but can feel more in his arch  EDEMA:  None  MUSCLE LENGTH: Did not assess  PALPATION: Did not assess  LOWER EXTREMITY ROM: WNL  LOWER EXTREMITY MMT:  MMT Right eval Left eval  Hip flexion 4+ 4  Hip extension    Hip abduction 5 5  Hip adduction    Hip internal rotation    Hip external rotation    Knee  flexion 4+ 4+  Knee extension 4+ 4+  Ankle dorsiflexion    Ankle plantarflexion    Ankle inversion    Ankle eversion     (Blank rows = not tested)  LOWER EXTREMITY SPECIAL TESTS:  Did not assess  FUNCTIONAL TESTS:  5 times sit to stand: 8.14 sec; LEs against bed for stability, hands on knees FGA: 21/30  M-CTSIB  Condition 1: Firm Surface, EO 30 Sec, Normal Sway  Condition 2: Firm Surface, EC 30 Sec, Mild Sway  Condition 3: Foam Surface, EO 30 Sec, Mild Sway  Condition 4: Foam Surface, EC 20 Sec, Moderate and Severe Sway      GAIT: Distance walked: Into clinic; at least 200' Assistive device utilized: None Level of assistance: Complete Independence Comments: Amb 10 meters in 10.26 sec = 0.97 m/s. Widened BOS, R foot externally rotated, slap foot R>L  VESTIBULAR ASSESSMENT:  GENERAL OBSERVATION: n/a   SYMPTOM BEHAVIOR:  Subjective history: Reports no attacks of imbalance and unsteadiness since leaving the hospital but general unsteadiness. Has had PT in the past for dizziness. Went to one clinic and got what sounds like canalith repositioning and then went to another clinic who  performed primarily balance work.   Non-Vestibular symptoms: None  Type of dizziness: Imbalance (Disequilibrium), Unsteady with head/body turns, and Lightheadedness/Faint  Frequency: No attacks since leaving the hospital but still feels unsteady  Duration: none  Aggravating factors: No known aggravating factors  Relieving factors: no known relieving factors  Progression of symptoms: better  OCULOMOTOR EXAM:  Ocular Alignment: normal  Ocular ROM: No Limitations  Spontaneous Nystagmus: absent  Gaze-Induced Nystagmus: absent  Smooth Pursuits: intact  Saccades: intact   R eye has one black dot and L eye has multiple black dots and floaters but pt reports this is normal  FRENZEL - FIXATION SUPRESSED: Did not assesss  VESTIBULAR - OCULAR REFLEX:   Slow VOR: Normal  VOR Cancellation: Normal  Head-Impulse Test: HIT Right: negative HIT Left: positive  Dynamic Visual Acuity: TBA   POSITIONAL TESTING:  Sidelying Dix-hallpike R: (-) -- some dizziness/unsteadiness with return to sitting Sidelying Dix-hallpike L: (-) -- some dizziness/unsteadiness with return to sitting   MOTION SENSITIVITY: not performed   OTHOSTATICS: not done                                                                                                                               ASSESSMENT:  CLINICAL IMPRESSION: Pt presents today ***. Skilled PT session focused on ***. Pt needs ***. Pt will continue to benefit from skilled PT towards goals for improved functional mobility and decreased fall risk.   Worked on balance on foam today. Some difficulty with gaze stability especially in horizontal plane. Worked on dynamic balance with obstacle negotiation. Can feel more hip soreness and more challenged with backwards stepping.  OBJECTIVE IMPAIRMENTS: Abnormal gait, decreased balance, decreased coordination, decreased endurance, decreased mobility, difficulty walking, decreased ROM, decreased strength, dizziness,  impaired UE functional use, improper body mechanics, postural dysfunction, and pain.   ACTIVITY LIMITATIONS: lifting, bending, standing, squatting, stairs, transfers, bed mobility, locomotion level, and caring for others  PARTICIPATION LIMITATIONS: shopping, community activity, and yard work  PERSONAL FACTORS: Age, Fitness, Past/current experiences, and Time since onset of injury/illness/exacerbation are also affecting patient's functional outcome.   REHAB POTENTIAL: Good  CLINICAL DECISION MAKING: Evolving/moderate complexity  EVALUATION COMPLEXITY: Moderate   GOALS: Goals reviewed with patient? Yes  SHORT TERM GOALS: Target date: 11/27/2023  Pt will be ind with initial HEP Baseline: Goal status: INITIAL  2.  Pt will report improved dizziness by >/=25% Baseline:  Goal status: INITIAL   LONG TERM GOALS: Target date: 12/25/2023   Pt will be ind with management and progression of HEP Baseline:  Goal status: INITIAL  2.  Pt will have improved FGA to >/=28/30 for reduced fall risk Baseline: 21 Goal status: INITIAL  3.  Pt will have improved gait speed to >/=1.1 m/s to be considered high functioning Baseline: 0.97 m/s Goal status: INITIAL  4.  Pt will have improved PSFS average score to >/=8 to demo MCID Baseline: 6 Goal status: INITIAL  5.  Pt will report improved dizzy symptoms by >/=50% Baseline:  Goal status: INITIAL    PLAN:  PT FREQUENCY: 2x/week  PT DURATION: 8 weeks  PLANNED INTERVENTIONS: 97164- PT Re-evaluation, 97750- Physical Performance Testing, 97110-Therapeutic exercises, 97530- Therapeutic activity, [id number]- Neuromuscular re-education, 97535- Self Care, 02859- Manual therapy, [id number]- Gait training, [phone]- Canalith repositioning, [id number]- Aquatic Therapy, [phone]- Electrical stimulation (unattended), 20560 (1-2 muscles), 20561 (3+ muscles)- Dry Needling, Patient/Family education, Balance training, Stair training, Taping, Joint mobilization, Spinal  mobilization, Vestibular training, Cryotherapy, and Moist heat  PLAN FOR NEXT SESSION: ***reactive balance, and dynamic visual acuity. Initiate strength, balance, gaze stabilization HEP.    Greig Anon, PT 11/11/23 7:48 AM Phone: [phone] Fax: [phone]  [facility] [place] [address] [place], [address] [place], [place] [address] Phone # [phone] Fax # [phone]

## 2023-11-13 ENCOUNTER — Ambulatory Visit: Admitting: Physical Therapy

## 2023-11-13 ENCOUNTER — Encounter: Payer: Self-pay | Admitting: Physical Therapy

## 2023-11-13 DIAGNOSIS — R2681 Unsteadiness on feet: Secondary | ICD-10-CM | POA: Diagnosis not present

## 2023-11-13 DIAGNOSIS — R2689 Other abnormalities of gait and mobility: Secondary | ICD-10-CM

## 2023-11-13 DIAGNOSIS — M6281 Muscle weakness (generalized): Secondary | ICD-10-CM | POA: Diagnosis not present

## 2023-11-13 NOTE — Therapy (Signed)
 OUTPATIENT PHYSICAL THERAPY LOWER EXTREMITY TREATMENT   Patient Name: Brandon Flores MRN: [id number] DOB:07/11/1945, 78 y.o., male Today's Date: 11/13/2023  END OF SESSION:  PT End of Session - 11/13/23 0851     Visit Number 4    Number of Visits 16    Date for PT Re-Evaluation 12/25/23    Authorization Type Medicare    Progress Note Due on Visit 10    PT Start Time [phone]    PT Stop Time 0930    PT Time Calculation (min) 39 min    Equipment Utilized During Treatment Gait belt    Activity Tolerance Patient tolerated treatment well    Behavior During Therapy WFL for tasks assessed/performed           Past Medical History:  Diagnosis Date   Cervical radiculopathy    Colon polyp    (2006- adenoma and tubulovillous adenoma; 2010-adenomas; repeat 201)-Dr. Vicci   Complex partial seizure disorder (HCC) 10/08/2014   COVID-19 2021   GERD (gastroesophageal reflux disease)    Gout    Hypercholesterolemia    Lateral epicondylitis    2002   Migraine    headaches   Ophthalmalgia    Dr. Roz   Orthodontics    Dr. Hughie   Partial epilepsy with impairment of consciousness (HCC)    Plantar fasciitis    Prostate cancer (HCC)    Seizure disorder (HCC)    Dr. Cleotilde ([facility])   Seizures [facility])    Traumatic incomplete tear of left rotator cuff 2024   UTI (urinary tract infection)    Vertebrobasilar insufficiency    Vertigo    Past Surgical History:  Procedure Laterality Date   BACK SURGERY     HEMORROIDECTOMY     NECK SURGERY     PROSTATE BIOPSY     Patient Active Problem List   Diagnosis Date Noted   Debility 09/23/2023   Sepsis (HCC) 09/19/2023   Acute cystitis with hematuria 09/19/2023   TIA (transient ischemic attack) 09/19/2023   Encephalomalacia 12/06/2021   Malignant neoplasm of prostate (HCC) 02/03/2020   Obesity (BMI 30-39.9) 08/31/2016   Chronic neck pain 08/31/2016   Cervical radiculopathy 05/03/2016   HLD (hyperlipidemia) 05/03/2016   GERD  (gastroesophageal reflux disease) 04/25/2016   Hyperuricemia 04/25/2016   Low back pain without sciatica 04/25/2016   Degenerative arthritis of left knee 04/25/2016   Metatarsalgia of both feet 04/25/2016   Gout 04/25/2016   Migraine headache 04/25/2016   Nuclear sclerosis of both eyes 04/25/2016   History of colonic polyps 04/25/2016   VBI (vertebrobasilar insufficiency) 04/25/2016   Vertigo 03/10/2014   Generalized convulsive epilepsy (HCC) 11/12/2013   Choroidal nevus, right 10/12/2013   Vitreous floaters 10/12/2013    PCP: Hughie Sharper, MD  REFERRING PROVIDER: Hughie Sharper, MD  REFERRING DIAG: R53.81 (ICD-10-CM) - Debility  THERAPY DIAG:  Unsteadiness on feet  Muscle weakness (generalized)  Other abnormalities of gait and mobility  Rationale for Evaluation and Treatment: Rehabilitation  ONSET DATE: 09/28/23  SUBJECTIVE:   SUBJECTIVE STATEMENT: I'm sore.  Going back to [facility] and using the machines.  Biggest problem is my balance.  Haven't had dizzy or vertigo spell since getting home from the hospital.  PERTINENT HISTORY: history of left temporal hemorrhage with encephalomalacia, petit mall disorder, BPPV, vertebrobasilar insufficiency, prostate cancer, gait disorder who was admitted to hospital on 09/19/2023 with dizziness and fall with inability to get up.   Admitted to CIR 09/23/23 and discharged home 09/28/23  PAIN:  Are you having pain? No  PRECAUTIONS: Fall  RED FLAGS: None   WEIGHT BEARING RESTRICTIONS: No  FALLS:  Has patient fallen in last 6 months? No  LIVING ENVIRONMENT: Lives with: lives with their spouse Lives in: House/apartment Stairs: 3 steps in the front, side door is flat entry, 14-16 stairs inside house (L side rail) goes down one step at a time Has following equipment at home: Single point cane, Quad cane small base, Quad cane large base, and Walker - 2 wheeled  OCCUPATION: Retired; watching TV most days and reading, tries to go out  once a day and walk in the park  PLOF: Independent  PATIENT GOALS: Improve walking and balance  NEXT MD VISIT: n/a  OBJECTIVE:    TODAY'S TREATMENT: 11/13/2023 Activity Comments  NuStep, Level 5, 4 extremities x 8 min SPM>60-65  Seated vitals:  100/72, HR 91 Standing vitals:  91/65, HR 98  Mild, brief dizziness upon standing  Dynamic balance: Forward/back walking x 2 min Forward/back tandem gait x 5 reps in parallel bars Sidestepping x 2 min   Standing on Airex: Forward step and weightshift Side step and weightshift Back step and weightshift Minisquats x 5 Minisquat to up on toes x 5 Hands hovering above bars               PATIENT EDUCATION:  Education details: Armed forces technical officer, 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; continue current HEP; BP measures from sitting/standing today (slightly lower)-continue to monitor and use slow transitions for better balance/safety Person educated: Patient Education method: Explanation, Demonstration, and Handouts Education comprehension: verbalized understanding, returned demonstration, and needs further education  HOME EXERCISE PROGRAM: Access Code: [email or web address] URL: [email or web address] Date: 11/04/2023 Prepared by: Burnard Sandifer  Exercises - Resisted Sit-to-Stand With Dumbbell at Chest  - 1 x daily - 7 x weekly - 3 sets - 10 reps - Corner Balance Feet Together With Eyes Closed  - 1 x daily - 7 x weekly - 3 sets - 10 reps - Standing Bent Over Single Arm Scapular Row with Table Support  - 1 x daily - 7 x weekly - 3 sets - 10 reps - Corner Balance Feet Together: Eyes Closed With Head Turns  - 1 x daily - 7 x weekly - 1 sets - 5 reps   Note: Objective measures were completed at Evaluation unless otherwise noted.  DIAGNOSTIC FINDINGS: 09/19/23 Brain MRI IMPRESSION: 1. No evidence of acute intracranial abnormality. 2. Sequela of remote hemorrhage in the anterior left temporal lobe  CTA head heck was  negative for LVO and showed chronic R-VA occlusion or poor flow, moderate stenosis proximal BA, high grade stenosis R-ICA 65-70%, severe stenosis L-ACA origin, 50% stenosis L-CCA and L-ICA bulb and up to moderate stenosis distal left PCA, R-ACA A2 and L-MCA M1 segment. CT abdomen pelvis done revealing bladder wall thickening with pericystic edema suspicious for cystitis and hyperattenuation in dependent bladder favored to be early contrast secretion rather than new bladder stone. 2D echo showed EF 60-65% with mild LVH.   PATIENT SURVEYS:  PSFS: THE PATIENT SPECIFIC FUNCTIONAL SCALE  Place score of 0-10 (0 = unable to perform activity and 10 = able to perform activity at the same level as before injury or problem)  Activity Date: 10/30/23    Walking 7 (catches himself more to get his balance, shaky)    2.  Standing balance 5     3.     4.  Total Score 6      Total Score = Sum of activity scores/number of activities  Minimally Detectable Change: 3 points (for single activity); 2 points (for average score)  [facility] (nd). The Patient Specific Functional Scale . Retrieved from [email or web address]   COGNITION: Overall cognitive status: Within functional limits for tasks assessed     SENSATION: Neuropathy in bilat feet but can feel more in his arch  EDEMA:  None  MUSCLE LENGTH: Did not assess  PALPATION: Did not assess  LOWER EXTREMITY ROM: WNL  LOWER EXTREMITY MMT:  MMT Right eval Left eval  Hip flexion 4+ 4  Hip extension    Hip abduction 5 5  Hip adduction    Hip internal rotation    Hip external rotation    Knee flexion 4+ 4+  Knee extension 4+ 4+  Ankle dorsiflexion    Ankle plantarflexion    Ankle inversion    Ankle eversion     (Blank rows = not tested)  LOWER EXTREMITY SPECIAL TESTS:  Did not assess  FUNCTIONAL TESTS:  5 times sit to stand: 8.14 sec; LEs against bed for stability,  hands on knees FGA: 21/30  M-CTSIB  Condition 1: Firm Surface, EO 30 Sec, Normal Sway  Condition 2: Firm Surface, EC 30 Sec, Mild Sway  Condition 3: Foam Surface, EO 30 Sec, Mild Sway  Condition 4: Foam Surface, EC 20 Sec, Moderate and Severe Sway      GAIT: Distance walked: Into clinic; at least 200' Assistive device utilized: None Level of assistance: Complete Independence Comments: Amb 10 meters in 10.26 sec = 0.97 m/s. Widened BOS, R foot externally rotated, slap foot R>L  VESTIBULAR ASSESSMENT:  GENERAL OBSERVATION: n/a   SYMPTOM BEHAVIOR:  Subjective history: Reports no attacks of imbalance and unsteadiness since leaving the hospital but general unsteadiness. Has had PT in the past for dizziness. Went to one clinic and got what sounds like canalith repositioning and then went to another clinic who performed primarily balance work.   Non-Vestibular symptoms: None  Type of dizziness: Imbalance (Disequilibrium), Unsteady with head/body turns, and Lightheadedness/Faint  Frequency: No attacks since leaving the hospital but still feels unsteady  Duration: none  Aggravating factors: No known aggravating factors  Relieving factors: no known relieving factors  Progression of symptoms: better  OCULOMOTOR EXAM:  Ocular Alignment: normal  Ocular ROM: No Limitations  Spontaneous Nystagmus: absent  Gaze-Induced Nystagmus: absent  Smooth Pursuits: intact  Saccades: intact   R eye has one black dot and L eye has multiple black dots and floaters but pt reports this is normal  FRENZEL - FIXATION SUPRESSED: Did not assesss  VESTIBULAR - OCULAR REFLEX:   Slow VOR: Normal  VOR Cancellation: Normal  Head-Impulse Test: HIT Right: negative HIT Left: positive  Dynamic Visual Acuity: TBA   POSITIONAL TESTING:  Sidelying [place]-hallpike R: (-) -- some dizziness/unsteadiness with return to sitting Sidelying [place]-hall[place] L: (-) -- some dizziness/unsteadiness with return to  sitting   MOTION SENSITIVITY: not performed   OTHOSTATICS: not done  ASSESSMENT:  CLINICAL IMPRESSION: Pt presents today with no new complaints.  He does continue to report some dizziness that is brief upon changes of position (sit to stand, getting up from the bed).  Assessed sitting and standing vitals; both lower than normal, per patient report and BP did drop (not quite 10 points) upon standing.  Educated pt to monitor this at home and to transition slowly from one position to another.  Skilled PT session focused on dynamic and complaint surface balance. Pt needs min guard assist at times for unlevel surfaces. Pt will continue to benefit from skilled PT towards goals for improved functional mobility and decreased fall risk.   OBJECTIVE IMPAIRMENTS: Abnormal gait, decreased balance, decreased coordination, decreased endurance, decreased mobility, difficulty walking, decreased ROM, decreased strength, dizziness, impaired UE functional use, improper body mechanics, postural dysfunction, and pain.   ACTIVITY LIMITATIONS: lifting, bending, standing, squatting, stairs, transfers, bed mobility, locomotion level, and caring for others  PARTICIPATION LIMITATIONS: shopping, community activity, and yard work  PERSONAL FACTORS: Age, Fitness, Past/current experiences, and Time since onset of injury/illness/exacerbation are also affecting patient's functional outcome.   REHAB POTENTIAL: Good  CLINICAL DECISION MAKING: Evolving/moderate complexity  EVALUATION COMPLEXITY: Moderate   GOALS: Goals reviewed with patient? Yes  SHORT TERM GOALS: Target date: 11/27/2023  Pt will be ind with initial HEP Baseline: Goal status: INITIAL  2.  Pt will report improved dizziness by >/=25% Baseline:  Goal status: INITIAL   LONG TERM GOALS: Target date: 12/25/2023   Pt will be  ind with management and progression of HEP Baseline:  Goal status: INITIAL  2.  Pt will have improved FGA to >/=28/30 for reduced fall risk Baseline: 21 Goal status: INITIAL  3.  Pt will have improved gait speed to >/=1.1 m/s to be considered high functioning Baseline: 0.97 m/s Goal status: INITIAL  4.  Pt will have improved PSFS average score to >/=8 to demo MCID Baseline: 6 Goal status: INITIAL  5.  Pt will report improved dizzy symptoms by >/=50% Baseline:  Goal status: INITIAL    PLAN:  PT FREQUENCY: 2x/week  PT DURATION: 8 weeks  PLANNED INTERVENTIONS: [id number]- PT Re-evaluation, 97750- Physical Performance Testing, 97110-Therapeutic exercises, 97530- Therapeutic activity, [id number]- Neuromuscular re-education, 97535- Self Care, [id number]- Manual therapy, [id number]- Gait training, [id number]- Canalith repositioning, [id number]- Aquatic Therapy, [phone]- Electrical stimulation (unattended), 20560 (1-2 muscles), 20561 (3+ muscles)- Dry Needling, Patient/Family education, Balance training, Stair training, Taping, Joint mobilization, Spinal mobilization, Vestibular training, Cryotherapy, and Moist heat  PLAN FOR NEXT SESSION: Continue work on reactive balance, and dynamic visual acuity. Initiate strength, balance, gaze stabilization HEP.    Greig Anon, PT 11/13/23 11:02 AM Phone: [phone] Fax: [phone]  [facility] at [facility] [address] [place], [address] [place], [place] [address] Phone # [phone] Fax # [phone]

## 2023-11-18 ENCOUNTER — Encounter: Payer: Self-pay | Admitting: Physical Therapy

## 2023-11-18 ENCOUNTER — Ambulatory Visit: Admitting: Physical Therapy

## 2023-11-18 DIAGNOSIS — M6281 Muscle weakness (generalized): Secondary | ICD-10-CM | POA: Diagnosis not present

## 2023-11-18 DIAGNOSIS — R2681 Unsteadiness on feet: Secondary | ICD-10-CM | POA: Diagnosis not present

## 2023-11-18 DIAGNOSIS — R2689 Other abnormalities of gait and mobility: Secondary | ICD-10-CM | POA: Diagnosis not present

## 2023-11-18 NOTE — Therapy (Signed)
 OUTPATIENT PHYSICAL THERAPY LOWER EXTREMITY TREATMENT   Patient Name: Brandon Flores MRN: [id number] DOB:[birth year], 78 y.o., male Today's Date: 11/18/2023  END OF SESSION:  PT End of Session - 11/18/23 0841     Visit Number 5    Number of Visits 16    Date for PT Re-Evaluation 12/25/23    Authorization Type Medicare    Progress Note Due on Visit 10    PT Start Time 0843    PT Stop Time 0928    PT Time Calculation (min) 45 min    Equipment Utilized During Treatment Gait belt    Activity Tolerance Patient tolerated treatment well    Behavior During Therapy WFL for tasks assessed/performed            Past Medical History:  Diagnosis Date   Cervical radiculopathy    Colon polyp    (2006- adenoma and tubulovillous adenoma; 2010-adenomas; repeat 201)-Dr. Vicci   Complex partial seizure disorder (HCC) 10/08/2014   COVID-19 2021   GERD (gastroesophageal reflux disease)    Gout    Hypercholesterolemia    Lateral epicondylitis    2002   Migraine    headaches   Ophthalmalgia    Dr. Roz   Orthodontics    Dr. Hughie   Partial epilepsy with impairment of consciousness (HCC)    Plantar fasciitis    Prostate cancer (HCC)    Seizure disorder (HCC)    Dr. Cleotilde ([facility])   Seizures [facility])    Traumatic incomplete tear of left rotator cuff 2024   UTI (urinary tract infection)    Vertebrobasilar insufficiency    Vertigo    Past Surgical History:  Procedure Laterality Date   BACK SURGERY     HEMORROIDECTOMY     NECK SURGERY     PROSTATE BIOPSY     Patient Active Problem List   Diagnosis Date Noted   Debility 09/23/2023   Sepsis (HCC) 09/19/2023   Acute cystitis with hematuria 09/19/2023   TIA (transient ischemic attack) 09/19/2023   Encephalomalacia 12/06/2021   Malignant neoplasm of prostate (HCC) 02/03/2020   Obesity (BMI 30-39.9) 08/31/2016   Chronic neck pain 08/31/2016   Cervical radiculopathy 05/03/2016   HLD (hyperlipidemia) 05/03/2016   GERD  (gastroesophageal reflux disease) 04/25/2016   Hyperuricemia 04/25/2016   Low back pain without sciatica 04/25/2016   Degenerative arthritis of left knee 04/25/2016   Metatarsalgia of both feet 04/25/2016   Gout 04/25/2016   Migraine headache 04/25/2016   Nuclear sclerosis of both eyes 04/25/2016   History of colonic polyps 04/25/2016   VBI (vertebrobasilar insufficiency) 04/25/2016   Vertigo 03/10/2014   Generalized convulsive epilepsy (HCC) 11/12/2013   Choroidal nevus, right 10/12/2013   Vitreous floaters 10/12/2013    PCP: Hughie Sharper, MD  REFERRING PROVIDER: Hughie Sharper, MD  REFERRING DIAG: R53.81 (ICD-10-CM) - Debility  THERAPY DIAG:  Unsteadiness on feet  Other abnormalities of gait and mobility  Muscle weakness (generalized)  Rationale for Evaluation and Treatment: Rehabilitation  ONSET DATE: 09/28/23  SUBJECTIVE:   SUBJECTIVE STATEMENT: I'm sore all over.  Going to the gym-using the bike and treadmill for 10 minutes-doing 3 sets of 10 with the machines.  So far, doing the gym 2x/wk.  A little dizziness with walking and with quick motion to get up.    PERTINENT HISTORY: history of left temporal hemorrhage with encephalomalacia, petit mall disorder, BPPV, vertebrobasilar insufficiency, prostate cancer, gait disorder who was admitted to hospital on 09/19/2023 with dizziness and fall  with inability to get up.   Admitted to CIR 09/23/23 and discharged home 09/28/23  PAIN:  Are you having pain? No-just muscle soreness  PRECAUTIONS: Fall  RED FLAGS: None   WEIGHT BEARING RESTRICTIONS: No  FALLS:  Has patient fallen in last 6 months? No  LIVING ENVIRONMENT: Lives with: lives with their spouse Lives in: House/apartment Stairs: 3 steps in the front, side door is flat entry, 14-16 stairs inside house (L side rail) goes down one step at a time Has following equipment at home: Single point cane, Quad cane small base, Quad cane large base, and Walker - 2  wheeled  OCCUPATION: Retired; watching TV most days and reading, tries to go out once a day and walk in the park  PLOF: Independent  PATIENT GOALS: Improve walking and balance  NEXT MD VISIT: n/a  OBJECTIVE:    TODAY'S TREATMENT: 11/18/2023 Activity Comments  Vitals-sitting 110/73, HR 92 bpm  Forward/back walking x 2 min Sidestepping x 1 min  With pain in lateral hips  Resisted hip abduction 2 x 10 Hip extension x 10 Red band C/o lightheadedness at end of set  Attempted seated IT band/figure 4 stretch Pain increased R hip and low back  Standing IT Band stretch, 3 x 30 sec Good form/stretch, no pain  On Airex: Feet apart/feet partial tandem-EO head turns/nods, EC head steady x 30 sec More sway EC partial tandem Feels a little lightheaded  Vitals: 88/64, HR 84 bpm   Vitals after 93/64, HR 78 bpm  After several minutes of rest          PATIENT EDUCATION:  Education details: Vitals with his reports of lightheadedness-pt having low blood pressure.  Advised that he speak with MD about his low BP readings and dropping of BP with sit>stand positions Person educated: Patient Education method: Explanation, Demonstration, and Handouts Education comprehension: verbalized understanding, returned demonstration, and needs further education  HOME EXERCISE PROGRAM: Access Code: [email or web address] URL: [email or web address] Date: 11/04/2023 Prepared by: Burnard Sandifer  Exercises - Resisted Sit-to-Stand With Dumbbell at Chest  - 1 x daily - 7 x weekly - 3 sets - 10 reps - Corner Balance Feet Together With Eyes Closed  - 1 x daily - 7 x weekly - 3 sets - 10 reps - Standing Bent Over Single Arm Scapular Row with Table Support  - 1 x daily - 7 x weekly - 3 sets - 10 reps - Corner Balance Feet Together: Eyes Closed With Head Turns  - 1 x daily - 7 x weekly - 1 sets - 5 reps   Note: Objective measures were completed at Evaluation unless otherwise noted.  DIAGNOSTIC FINDINGS: 09/19/23  Brain MRI IMPRESSION: 1. No evidence of acute intracranial abnormality. 2. Sequela of remote hemorrhage in the anterior left temporal lobe  CTA head heck was negative for LVO and showed chronic R-VA occlusion or poor flow, moderate stenosis proximal BA, high grade stenosis R-ICA 65-70%, severe stenosis L-ACA origin, 50% stenosis L-CCA and L-ICA bulb and up to moderate stenosis distal left PCA, R-ACA A2 and L-MCA M1 segment. CT abdomen pelvis done revealing bladder wall thickening with pericystic edema suspicious for cystitis and hyperattenuation in dependent bladder favored to be early contrast secretion rather than new bladder stone. 2D echo showed EF 60-65% with mild LVH.   PATIENT SURVEYS:  PSFS: THE PATIENT SPECIFIC FUNCTIONAL SCALE  Place score of 0-10 (0 = unable to perform activity and 10 = able to perform activity at the same level as  before injury or problem)  Activity Date: 10/30/23    Walking 7 (catches himself more to get his balance, shaky)    2.  Standing balance 5     3.     4.      Total Score 6      Total Score = Sum of activity scores/number of activities  Minimally Detectable Change: 3 points (for single activity); 2 points (for average score)  [facility] (nd). The Patient Specific Functional Scale . Retrieved from [email or web address]   COGNITION: Overall cognitive status: Within functional limits for tasks assessed     SENSATION: Neuropathy in bilat feet but can feel more in his arch  EDEMA:  None  MUSCLE LENGTH: Did not assess  PALPATION: Did not assess  LOWER EXTREMITY ROM: WNL  LOWER EXTREMITY MMT:  MMT Right eval Left eval  Hip flexion 4+ 4  Hip extension    Hip abduction 5 5  Hip adduction    Hip internal rotation    Hip external rotation    Knee flexion 4+ 4+  Knee extension 4+ 4+  Ankle dorsiflexion    Ankle plantarflexion    Ankle inversion    Ankle eversion      (Blank rows = not tested)  LOWER EXTREMITY SPECIAL TESTS:  Did not assess  FUNCTIONAL TESTS:  5 times sit to stand: 8.14 sec; LEs against bed for stability, hands on knees FGA: 21/30  M-CTSIB  Condition 1: Firm Surface, EO 30 Sec, Normal Sway  Condition 2: Firm Surface, EC 30 Sec, Mild Sway  Condition 3: Foam Surface, EO 30 Sec, Mild Sway  Condition 4: Foam Surface, EC 20 Sec, Moderate and Severe Sway      GAIT: Distance walked: Into clinic; at least 200' Assistive device utilized: None Level of assistance: Complete Independence Comments: Amb 10 meters in 10.26 sec = 0.97 m/s. Widened BOS, R foot externally rotated, slap foot R>L  VESTIBULAR ASSESSMENT:  GENERAL OBSERVATION: n/a   SYMPTOM BEHAVIOR:  Subjective history: Reports no attacks of imbalance and unsteadiness since leaving the hospital but general unsteadiness. Has had PT in the past for dizziness. Went to one clinic and got what sounds like canalith repositioning and then went to another clinic who performed primarily balance work.   Non-Vestibular symptoms: None  Type of dizziness: Imbalance (Disequilibrium), Unsteady with head/body turns, and Lightheadedness/Faint  Frequency: No attacks since leaving the hospital but still feels unsteady  Duration: none  Aggravating factors: No known aggravating factors  Relieving factors: no known relieving factors  Progression of symptoms: better  OCULOMOTOR EXAM:  Ocular Alignment: normal  Ocular ROM: No Limitations  Spontaneous Nystagmus: absent  Gaze-Induced Nystagmus: absent  Smooth Pursuits: intact  Saccades: intact   R eye has one black dot and L eye has multiple black dots and floaters but pt reports this is normal  FRENZEL - FIXATION SUPRESSED: Did not assesss  VESTIBULAR - OCULAR REFLEX:   Slow VOR: Normal  VOR Cancellation: Normal  Head-Impulse Test: HIT Right: negative HIT Left: positive  Dynamic Visual Acuity: TBA   POSITIONAL TESTING:  Sidelying  Dix-hallpike R: (-) -- some dizziness/unsteadiness with return to sitting Sidelying Dix-hallpike L: (-) -- some dizziness/unsteadiness with return to sitting   MOTION SENSITIVITY: not performed   OTHOSTATICS: not done  ASSESSMENT:  CLINICAL IMPRESSION: Pt presents today with continued reports of occasional dizziness upon first standing and walking  at home over the weekend, which pt describes as lightheadedness.  He describes this feeling several times in session today, so assessed vitals when he reports lightheadedness and his BP measures are lower (88/64).  May try to formally assess orthostatic BP measures ahead of pt's MD appt, when he comes in for PT next visit.  He will continue to benefit from skilled PT towards goals for improved functional mobility and decreased fall risk.   OBJECTIVE IMPAIRMENTS: Abnormal gait, decreased balance, decreased coordination, decreased endurance, decreased mobility, difficulty walking, decreased ROM, decreased strength, dizziness, impaired UE functional use, improper body mechanics, postural dysfunction, and pain.   ACTIVITY LIMITATIONS: lifting, bending, standing, squatting, stairs, transfers, bed mobility, locomotion level, and caring for others  PARTICIPATION LIMITATIONS: shopping, community activity, and yard work  PERSONAL FACTORS: Age, Fitness, Past/current experiences, and Time since onset of injury/illness/exacerbation are also affecting patient's functional outcome.   REHAB POTENTIAL: Good  CLINICAL DECISION MAKING: Evolving/moderate complexity  EVALUATION COMPLEXITY: Moderate   GOALS: Goals reviewed with patient? Yes  SHORT TERM GOALS: Target date: 11/27/2023  Pt will be ind with initial HEP Baseline: Goal status: INITIAL  2.  Pt will report improved dizziness by >/=25% Baseline:  Goal status:  INITIAL   LONG TERM GOALS: Target date: 12/25/2023   Pt will be ind with management and progression of HEP Baseline:  Goal status: INITIAL  2.  Pt will have improved FGA to >/=28/30 for reduced fall risk Baseline: 21 Goal status: INITIAL  3.  Pt will have improved gait speed to >/=1.1 m/s to be considered high functioning Baseline: 0.97 m/s Goal status: INITIAL  4.  Pt will have improved PSFS average score to >/=8 to demo MCID Baseline: 6 Goal status: INITIAL  5.  Pt will report improved dizzy symptoms by >/=50% Baseline:  Goal status: INITIAL    PLAN:  PT FREQUENCY: 2x/week  PT DURATION: 8 weeks  PLANNED INTERVENTIONS: 97164- PT Re-evaluation, 97750- Physical Performance Testing, 97110-Therapeutic exercises, 97530- Therapeutic activity, [id number]- Neuromuscular re-education, 97535- Self Care, [id number]- Manual therapy, [id number]- Gait training, [phone]- Canalith repositioning, [id number]- Aquatic Therapy, [phone]- Electrical stimulation (unattended), 20560 (1-2 muscles), 20561 (3+ muscles)- Dry Needling, Patient/Family education, Balance training, Stair training, Taping, Joint mobilization, Spinal mobilization, Vestibular training, Cryotherapy, and Moist heat  PLAN FOR NEXT SESSION: Orthostatic BP measures; continue work on reactive balance, and dynamic visual acuity. Initiate strength, balance, gaze stabilization HEP.    Greig Anon, PT 11/18/23 2:47 PM Phone: [phone] Fax: [phone]  [facility] [address] [place], [address] [place], [place] [address] Phone # [phone] Fax # [phone]

## 2023-11-18 NOTE — Patient Instructions (Signed)
 Vitals seated 110/73, HR 92 bpm   Pt reports lightheadedness after standing exercises (measured BP immediately after sitting):  88/64, HR 84 bpm

## 2023-11-20 ENCOUNTER — Ambulatory Visit: Admitting: Physical Therapy

## 2023-11-20 ENCOUNTER — Encounter: Payer: Self-pay | Admitting: Physical Therapy

## 2023-11-20 DIAGNOSIS — R2681 Unsteadiness on feet: Secondary | ICD-10-CM

## 2023-11-20 DIAGNOSIS — R2689 Other abnormalities of gait and mobility: Secondary | ICD-10-CM | POA: Diagnosis not present

## 2023-11-20 DIAGNOSIS — M6281 Muscle weakness (generalized): Secondary | ICD-10-CM | POA: Diagnosis not present

## 2023-11-20 NOTE — Therapy (Signed)
 OUTPATIENT PHYSICAL THERAPY LOWER EXTREMITY TREATMENT   Patient Name: Brandon Flores MRN: [id number] DOB:1945/06/11, 78 y.o., male Today's Date: 11/20/2023  END OF SESSION:  PT End of Session - 11/20/23 1258     Visit Number 6    Number of Visits 16    Date for PT Re-Evaluation 12/25/23    Authorization Type Medicare    Progress Note Due on Visit 10    PT Start Time [phone]    PT Stop Time 0930    PT Time Calculation (min) 44 min    Equipment Utilized During Treatment Gait belt    Activity Tolerance Patient tolerated treatment well    Behavior During Therapy WFL for tasks assessed/performed             Past Medical History:  Diagnosis Date   Cervical radiculopathy    Colon polyp    (2006- adenoma and tubulovillous adenoma; 2010-adenomas; repeat 201)-Dr. Vicci   Complex partial seizure disorder (HCC) 10/08/2014   COVID-19 2021   GERD (gastroesophageal reflux disease)    Gout    Hypercholesterolemia    Lateral epicondylitis    2002   Migraine    headaches   Ophthalmalgia    Dr. Roz   Orthodontics    Dr. Hughie   Partial epilepsy with impairment of consciousness (HCC)    Plantar fasciitis    Prostate cancer (HCC)    Seizure disorder (HCC)    Dr. Cleotilde ([facility])   Seizures [facility])    Traumatic incomplete tear of left rotator cuff 2024   UTI (urinary tract infection)    Vertebrobasilar insufficiency    Vertigo    Past Surgical History:  Procedure Laterality Date   BACK SURGERY     HEMORROIDECTOMY     NECK SURGERY     PROSTATE BIOPSY     Patient Active Problem List   Diagnosis Date Noted   Debility 09/23/2023   Sepsis (HCC) 09/19/2023   Acute cystitis with hematuria 09/19/2023   TIA (transient ischemic attack) 09/19/2023   Encephalomalacia 12/06/2021   Malignant neoplasm of prostate (HCC) 02/03/2020   Obesity (BMI 30-39.9) 08/31/2016   Chronic neck pain 08/31/2016   Cervical radiculopathy 05/03/2016   HLD (hyperlipidemia) 05/03/2016   GERD  (gastroesophageal reflux disease) 04/25/2016   Hyperuricemia 04/25/2016   Low back pain without sciatica 04/25/2016   Degenerative arthritis of left knee 04/25/2016   Metatarsalgia of both feet 04/25/2016   Gout 04/25/2016   Migraine headache 04/25/2016   Nuclear sclerosis of both eyes 04/25/2016   History of colonic polyps 04/25/2016   VBI (vertebrobasilar insufficiency) 04/25/2016   Vertigo 03/10/2014   Generalized convulsive epilepsy (HCC) 11/12/2013   Choroidal nevus, right 10/12/2013   Vitreous floaters 10/12/2013    PCP: Hughie Sharper, MD  REFERRING PROVIDER: Hughie Sharper, MD  REFERRING DIAG: R53.81 (ICD-10-CM) - Debility  THERAPY DIAG:  Unsteadiness on feet  Other abnormalities of gait and mobility  Rationale for Evaluation and Treatment: Rehabilitation  ONSET DATE: 09/28/23  SUBJECTIVE:   SUBJECTIVE STATEMENT: Went to see Dr. Hughie.  My BP measures were 140/70 sitting and standing.  Did not drop when I stood up.  Have not had any more episodes of dizziness.  R knee is bothering me-off and on for years, but has limited walking long distances.  PERTINENT HISTORY: history of left temporal hemorrhage with encephalomalacia, petit mall disorder, BPPV, vertebrobasilar insufficiency, prostate cancer, gait disorder who was admitted to hospital on 09/19/2023 with dizziness and fall with  inability to get up.   Admitted to CIR 09/23/23 and discharged home 09/28/23  PAIN:  Are you having pain? Yes: NPRS scale: 3/10 Pain location: R knee Pain description: sharp, maybe like it might give way Aggravating factors: steps, walking long distances Relieving factors: slowed pace   PRECAUTIONS: Fall  RED FLAGS: None   WEIGHT BEARING RESTRICTIONS: No  FALLS:  Has patient fallen in last 6 months? No  LIVING ENVIRONMENT: Lives with: lives with their spouse Lives in: House/apartment Stairs: 3 steps in the front, side door is flat entry, 14-16 stairs inside house (L side  rail) goes down one step at a time Has following equipment at home: Single point cane, Quad cane small base, Quad cane large base, and Walker - 2 wheeled  OCCUPATION: Retired; watching TV most days and reading, tries to go out once a day and walk in the park  PLOF: Independent  PATIENT GOALS: Improve walking and balance  NEXT MD VISIT: n/a  OBJECTIVE:    TODAY'S TREATMENT: 11/20/2023 Activity Comments  Orthostatic BP measures  140/76 supine 98/60 standing 100/70 standing after 3 min Manual cuff After 5 min After 1-2 min After 3 min  No dizziness c/o today  Seated ankle pumps x 1 min LAQ x 15 reps   Forward/back walk 20 ft each x 3 reps   On airex: -forward step taps to 6 step -feet apart EC + head turns/nods -Heel/ toe raises -feet partial heel/toe EO head turns/nods, then EC 30 sec Increased sway with EC  Resisted walking, 10-15#, holding bar at [employer] -backwards/forwards x 3 reps -sidestepping x 3 reps Cues for slowed eccentric control for balance recovery              PATIENT EDUCATION:  Education details: Reiterated education about dropping BP measures today-stay well hydrated, transition slowly between positions, gentle exercises (ankle pumps, heelslides, LAQ) prior to standing if pt has sat long periods Person educated: Patient Education method: Explanation, Demonstration, and Handouts Education comprehension: verbalized understanding, returned demonstration, and needs further education  HOME EXERCISE PROGRAM: Access Code: [email or web address] URL: [email or web address] Date: 11/04/2023 Prepared by: Burnard Sandifer  Exercises - Resisted Sit-to-Stand With Dumbbell at Chest  - 1 x daily - 7 x weekly - 3 sets - 10 reps - Corner Balance Feet Together With Eyes Closed  - 1 x daily - 7 x weekly - 3 sets - 10 reps - Standing Bent Over Single Arm Scapular Row with Table Support  - 1 x daily - 7 x weekly - 3 sets - 10 reps - Corner Balance Feet Together:  Eyes Closed With Head Turns  - 1 x daily - 7 x weekly - 1 sets - 5 reps   Note: Objective measures were completed at Evaluation unless otherwise noted.  DIAGNOSTIC FINDINGS: 09/19/23 Brain MRI IMPRESSION: 1. No evidence of acute intracranial abnormality. 2. Sequela of remote hemorrhage in the anterior left temporal lobe  CTA head heck was negative for LVO and showed chronic R-VA occlusion or poor flow, moderate stenosis proximal BA, high grade stenosis R-ICA 65-70%, severe stenosis L-ACA origin, 50% stenosis L-CCA and L-ICA bulb and up to moderate stenosis distal left PCA, R-ACA A2 and L-MCA M1 segment. CT abdomen pelvis done revealing bladder wall thickening with pericystic edema suspicious for cystitis and hyperattenuation in dependent bladder favored to be early contrast secretion rather than new bladder stone. 2D echo showed EF 60-65% with mild LVH.   PATIENT SURVEYS:  PSFS: THE PATIENT SPECIFIC FUNCTIONAL SCALE  Place score of 0-10 (0 = unable to perform activity and 10 = able to perform activity at the same level as before injury or problem)  Activity Date: 10/30/23    Walking 7 (catches himself more to get his balance, shaky)    2.  Standing balance 5     3.     4.      Total Score 6      Total Score = Sum of activity scores/number of activities  Minimally Detectable Change: 3 points (for single activity); 2 points (for average score)  [facility] (nd). The Patient Specific Functional Scale . Retrieved from [email or web address]   COGNITION: Overall cognitive status: Within functional limits for tasks assessed     SENSATION: Neuropathy in bilat feet but can feel more in his arch  EDEMA:  None  MUSCLE LENGTH: Did not assess  PALPATION: Did not assess  LOWER EXTREMITY ROM: WNL  LOWER EXTREMITY MMT:  MMT Right eval Left eval  Hip flexion 4+ 4  Hip extension    Hip abduction 5 5  Hip adduction     Hip internal rotation    Hip external rotation    Knee flexion 4+ 4+  Knee extension 4+ 4+  Ankle dorsiflexion    Ankle plantarflexion    Ankle inversion    Ankle eversion     (Blank rows = not tested)  LOWER EXTREMITY SPECIAL TESTS:  Did not assess  FUNCTIONAL TESTS:  5 times sit to stand: 8.14 sec; LEs against bed for stability, hands on knees FGA: 21/30  M-CTSIB  Condition 1: Firm Surface, EO 30 Sec, Normal Sway  Condition 2: Firm Surface, EC 30 Sec, Mild Sway  Condition 3: Foam Surface, EO 30 Sec, Mild Sway  Condition 4: Foam Surface, EC 20 Sec, Moderate and Severe Sway      GAIT: Distance walked: Into clinic; at least 200' Assistive device utilized: None Level of assistance: Complete Independence Comments: Amb 10 meters in 10.26 sec = 0.97 m/s. Widened BOS, R foot externally rotated, slap foot R>L  VESTIBULAR ASSESSMENT:  GENERAL OBSERVATION: n/a   SYMPTOM BEHAVIOR:  Subjective history: Reports no attacks of imbalance and unsteadiness since leaving the hospital but general unsteadiness. Has had PT in the past for dizziness. Went to one clinic and got what sounds like canalith repositioning and then went to another clinic who performed primarily balance work.   Non-Vestibular symptoms: None  Type of dizziness: Imbalance (Disequilibrium), Unsteady with head/body turns, and Lightheadedness/Faint  Frequency: No attacks since leaving the hospital but still feels unsteady  Duration: none  Aggravating factors: No known aggravating factors  Relieving factors: no known relieving factors  Progression of symptoms: better  OCULOMOTOR EXAM:  Ocular Alignment: normal  Ocular ROM: No Limitations  Spontaneous Nystagmus: absent  Gaze-Induced Nystagmus: absent  Smooth Pursuits: intact  Saccades: intact   R eye has one black dot and L eye has multiple black dots and floaters but pt reports this is normal  FRENZEL - FIXATION SUPRESSED: Did not assesss  VESTIBULAR -  OCULAR REFLEX:   Slow VOR: Normal  VOR Cancellation: Normal  Head-Impulse Test: HIT Right: negative HIT Left: positive  Dynamic Visual Acuity: TBA   POSITIONAL TESTING:  Sidelying [place]-hallpike R: (-) -- some dizziness/unsteadiness with return to sitting Sidelying [place] L: (-) -- some dizziness/unsteadiness with return to sitting   MOTION SENSITIVITY: not performed   OTHOSTATICS: not done  ASSESSMENT:  CLINICAL IMPRESSION: Pt presents today with no new complaints.  He has seen MD since last visit-no new dizziness episodes, and his BP measures were [facility] both at MD office and home.  Assessed true orthostatic measures today with manual cuff, and continued a drop in BP upon standing, but no dizziness symptoms.  Educated in ways to optimally manage potential drops in BP with position changes and then to follow up additionally with MD if needed.  Worked remainder of session on dynamic and compliant surface balance, as well as resisted gait for balance recovery.  No c/o dizziness or lightheadedness throughout session today.  Pt will continue to benefit from skilled PT towards goals for improved functional mobility and decreased fall risk.   OBJECTIVE IMPAIRMENTS: Abnormal gait, decreased balance, decreased coordination, decreased endurance, decreased mobility, difficulty walking, decreased ROM, decreased strength, dizziness, impaired UE functional use, improper body mechanics, postural dysfunction, and pain.   ACTIVITY LIMITATIONS: lifting, bending, standing, squatting, stairs, transfers, bed mobility, locomotion level, and caring for others  PARTICIPATION LIMITATIONS: shopping, community activity, and yard work  PERSONAL FACTORS: Age, Fitness, Past/current experiences, and Time since onset of injury/illness/exacerbation are also affecting patient's functional outcome.    REHAB POTENTIAL: Good  CLINICAL DECISION MAKING: Evolving/moderate complexity  EVALUATION COMPLEXITY: Moderate   GOALS: Goals reviewed with patient? Yes  SHORT TERM GOALS: Target date: 11/27/2023  Pt will be ind with initial HEP Baseline: Goal status: IN PROGRESS  2.  Pt will report improved dizziness by >/=25% Baseline:  Goal status: IN PROGRESS   LONG TERM GOALS: Target date: 12/25/2023   Pt will be ind with management and progression of HEP Baseline:  Goal status: IN PROGRESS  2.  Pt will have improved FGA to >/=28/30 for reduced fall risk Baseline: 21 Goal status: IN PROGRESS  3.  Pt will have improved gait speed to >/=1.1 m/s to be considered high functioning Baseline: 0.97 m/s Goal status: IN PROGRESS  4.  Pt will have improved PSFS average score to >/=8 to demo MCID Baseline: 6 Goal status: IN PROGRESS  5.  Pt will report improved dizzy symptoms by >/=50% Baseline:  Goal status: IN PROGRESS    PLAN:  PT FREQUENCY: 2x/week  PT DURATION: 8 weeks  PLANNED INTERVENTIONS: 97164- PT Re-evaluation, 97750- Physical Performance Testing, 97110-Therapeutic exercises, 97530- Therapeutic activity, [id number]- Neuromuscular re-education, 97535- Self Care, [id number]- Manual therapy, [phone]- Gait training, [phone]- Canalith repositioning, [id number]- Aquatic Therapy, [phone]- Electrical stimulation (unattended), 20560 (1-2 muscles), 20561 (3+ muscles)- Dry Needling, Patient/Family education, Balance training, Stair training, Taping, Joint mobilization, Spinal mobilization, Vestibular training, Cryotherapy, and Moist heat  PLAN FOR NEXT SESSION: Check STGs; continue work on reactive balance, and dynamic visual acuity. Initiate/progress strength, balance, gaze stabilization HEP.    Greig Anon, PT 11/20/23 12:59 PM Phone: [phone] Fax: [phone]  [facility] at [facility] [address] [place], [address] [place], [place] [address] Phone # [phone] Fax # [phone]

## 2023-11-22 NOTE — Therapy (Signed)
 OUTPATIENT PHYSICAL THERAPY LOWER EXTREMITY TREATMENT   Patient Name: Brandon Flores MRN: [id number] DOB:10/31/1945, 78 y.o., male Today's Date: 11/25/2023  END OF SESSION:  PT End of Session - 11/25/23 0926     Visit Number 7    Number of Visits 16    Date for PT Re-Evaluation 12/25/23    Authorization Type Medicare    Progress Note Due on Visit 10    PT Start Time 0847    PT Stop Time 0928    PT Time Calculation (min) 41 min    Equipment Utilized During Treatment Gait belt    Activity Tolerance Patient tolerated treatment well    Behavior During Therapy WFL for tasks assessed/performed              Past Medical History:  Diagnosis Date   Cervical radiculopathy    Colon polyp    (2006- adenoma and tubulovillous adenoma; 2010-adenomas; repeat 201)-Dr. Vicci   Complex partial seizure disorder (HCC) 10/08/2014   COVID-19 2021   GERD (gastroesophageal reflux disease)    Gout    Hypercholesterolemia    Lateral epicondylitis    2002   Migraine    headaches   Ophthalmalgia    Dr. Roz   Orthodontics    Dr. Hughie   Partial epilepsy with impairment of consciousness (HCC)    Plantar fasciitis    Prostate cancer (HCC)    Seizure disorder (HCC)    Dr. Cleotilde ([facility])   Seizures [facility])    Traumatic incomplete tear of left rotator cuff 2024   UTI (urinary tract infection)    Vertebrobasilar insufficiency    Vertigo    Past Surgical History:  Procedure Laterality Date   BACK SURGERY     HEMORROIDECTOMY     NECK SURGERY     PROSTATE BIOPSY     Patient Active Problem List   Diagnosis Date Noted   Debility 09/23/2023   Sepsis (HCC) 09/19/2023   Acute cystitis with hematuria 09/19/2023   TIA (transient ischemic attack) 09/19/2023   Encephalomalacia 12/06/2021   Malignant neoplasm of prostate (HCC) 02/03/2020   Obesity (BMI 30-39.9) 08/31/2016   Chronic neck pain 08/31/2016   Cervical radiculopathy 05/03/2016   HLD (hyperlipidemia) 05/03/2016   GERD  (gastroesophageal reflux disease) 04/25/2016   Hyperuricemia 04/25/2016   Low back pain without sciatica 04/25/2016   Degenerative arthritis of left knee 04/25/2016   Metatarsalgia of both feet 04/25/2016   Gout 04/25/2016   Migraine headache 04/25/2016   Nuclear sclerosis of both eyes 04/25/2016   History of colonic polyps 04/25/2016   VBI (vertebrobasilar insufficiency) 04/25/2016   Vertigo 03/10/2014   Generalized convulsive epilepsy (HCC) 11/12/2013   Choroidal nevus, right 10/12/2013   Vitreous floaters 10/12/2013    PCP: Hughie Sharper, MD  REFERRING PROVIDER: Hughie Sharper, MD  REFERRING DIAG: R53.81 (ICD-10-CM) - Debility  THERAPY DIAG:  Unsteadiness on feet  Other abnormalities of gait and mobility  Muscle weakness (generalized)  Rationale for Evaluation and Treatment: Rehabilitation  ONSET DATE: 09/28/23  SUBJECTIVE:   SUBJECTIVE STATEMENT: Pt reports that he had not had a vertigo attack since he left the hospital. Reports that he has not had any lightheadedness since last session.    PERTINENT HISTORY: history of left temporal hemorrhage with encephalomalacia, petit mall disorder, BPPV, vertebrobasilar insufficiency, prostate cancer, gait disorder who was admitted to hospital on 09/19/2023 with dizziness and fall with inability to get up.   Admitted to CIR 09/23/23 and discharged home 09/28/23  PAIN:  Are you having pain? Yes: NPRS scale: no Pain location: R knee Pain description: sharp, maybe like it might give way Aggravating factors: steps, walking long distances Relieving factors: slowed pace   PRECAUTIONS: Fall  RED FLAGS: None   WEIGHT BEARING RESTRICTIONS: No  FALLS:  Has patient fallen in last 6 months? No  LIVING ENVIRONMENT: Lives with: lives with their spouse Lives in: House/apartment Stairs: 3 steps in the front, side door is flat entry, 14-16 stairs inside house (L side rail) goes down one step at a time Has following equipment  at home: Single point cane, Quad cane small base, Quad cane large base, and Walker - 2 wheeled  OCCUPATION: Retired; watching TV most days and reading, tries to go out once a day and walk in the park  PLOF: Independent  PATIENT GOALS: Improve walking and balance  NEXT MD VISIT: n/a  OBJECTIVE:      TODAY'S TREATMENT: 11/25/23 Activity Comments  Vitals at start of session *taken manually  110/62 mmHg, 92% spO2, 88bpm  fwd/back stepping  1 UE support on TM rail weaning to no support; cueing for longer backwards step    fwd/back stepping + head nods/turns C/o some slight lightheadedness; 1 UE support   standing D2 flexion to cone on floor 5x each  C/o more intense lightheadedness; mild-mod instability   backwards walking + ball toss  Good stability   Walking EC  CGA; steps short and guarded       PATIENT EDUCATION: Education details: edu on improved hydration to address lightheadedness with transitional movements- pt reports that he has started drinking water where he was not before. Edu on safety with bending d/t pt reporting lightheadedness with bending today  Person educated: Patient Education method: Explanation Education comprehension: verbalized understanding     HOME EXERCISE PROGRAM: Access Code: [email or web address] URL: [email or web address] Date: 11/04/2023 Prepared by: Burnard Sandifer  Exercises - Resisted Sit-to-Stand With Dumbbell at Chest  - 1 x daily - 7 x weekly - 3 sets - 10 reps - Corner Balance Feet Together With Eyes Closed  - 1 x daily - 7 x weekly - 3 sets - 10 reps - Standing Bent Over Single Arm Scapular Row with Table Support  - 1 x daily - 7 x weekly - 3 sets - 10 reps - Corner Balance Feet Together: Eyes Closed With Head Turns  - 1 x daily - 7 x weekly - 1 sets - 5 reps     Note: Objective measures were completed at Evaluation unless otherwise noted.  DIAGNOSTIC FINDINGS: 09/19/23 Brain MRI IMPRESSION: 1. No evidence of acute intracranial  abnormality. 2. Sequela of remote hemorrhage in the anterior left temporal lobe  CTA head heck was negative for LVO and showed chronic R-VA occlusion or poor flow, moderate stenosis proximal BA, high grade stenosis R-ICA 65-70%, severe stenosis L-ACA origin, 50% stenosis L-CCA and L-ICA bulb and up to moderate stenosis distal left PCA, R-ACA A2 and L-MCA M1 segment. CT abdomen pelvis done revealing bladder wall thickening with pericystic edema suspicious for cystitis and hyperattenuation in dependent bladder favored to be early contrast secretion rather than new bladder stone. 2D echo showed EF 60-65% with mild LVH.   PATIENT SURVEYS:  PSFS: THE PATIENT SPECIFIC FUNCTIONAL SCALE  Place score of 0-10 (0 = unable to perform activity and 10 = able to perform activity at the same level as before injury or problem)  Activity Date: 10/30/23    Walking 7 (catches himself more  to get his balance, shaky)    2.  Standing balance 5     3.     4.      Total Score 6      Total Score = Sum of activity scores/number of activities  Minimally Detectable Change: 3 points (for single activity); 2 points (for average score)  [facility] (nd). The Patient Specific Functional Scale . Retrieved from [email or web address]   COGNITION: Overall cognitive status: Within functional limits for tasks assessed     SENSATION: Neuropathy in bilat feet but can feel more in his arch  EDEMA:  None  MUSCLE LENGTH: Did not assess  PALPATION: Did not assess  LOWER EXTREMITY ROM: WNL  LOWER EXTREMITY MMT:  MMT Right eval Left eval  Hip flexion 4+ 4  Hip extension    Hip abduction 5 5  Hip adduction    Hip internal rotation    Hip external rotation    Knee flexion 4+ 4+  Knee extension 4+ 4+  Ankle dorsiflexion    Ankle plantarflexion    Ankle inversion    Ankle eversion     (Blank rows = not tested)  LOWER EXTREMITY SPECIAL TESTS:   Did not assess  FUNCTIONAL TESTS:  5 times sit to stand: 8.14 sec; LEs against bed for stability, hands on knees FGA: 21/30  M-CTSIB  Condition 1: Firm Surface, EO 30 Sec, Normal Sway  Condition 2: Firm Surface, EC 30 Sec, Mild Sway  Condition 3: Foam Surface, EO 30 Sec, Mild Sway  Condition 4: Foam Surface, EC 20 Sec, Moderate and Severe Sway      GAIT: Distance walked: Into clinic; at least 200' Assistive device utilized: None Level of assistance: Complete Independence Comments: Amb 10 meters in 10.26 sec = 0.97 m/s. Widened BOS, R foot externally rotated, slap foot R>L  VESTIBULAR ASSESSMENT:  GENERAL OBSERVATION: n/a   SYMPTOM BEHAVIOR:  Subjective history: Reports no attacks of imbalance and unsteadiness since leaving the hospital but general unsteadiness. Has had PT in the past for dizziness. Went to one clinic and got what sounds like canalith repositioning and then went to another clinic who performed primarily balance work.   Non-Vestibular symptoms: None  Type of dizziness: Imbalance (Disequilibrium), Unsteady with head/body turns, and Lightheadedness/Faint  Frequency: No attacks since leaving the hospital but still feels unsteady  Duration: none  Aggravating factors: No known aggravating factors  Relieving factors: no known relieving factors  Progression of symptoms: better  OCULOMOTOR EXAM:  Ocular Alignment: normal  Ocular ROM: No Limitations  Spontaneous Nystagmus: absent  Gaze-Induced Nystagmus: absent  Smooth Pursuits: intact  Saccades: intact   R eye has one black dot and L eye has multiple black dots and floaters but pt reports this is normal  FRENZEL - FIXATION SUPRESSED: Did not assesss  VESTIBULAR - OCULAR REFLEX:   Slow VOR: Normal  VOR Cancellation: Normal  Head-Impulse Test: HIT Right: negative HIT Left: positive  Dynamic Visual Acuity: TBA   POSITIONAL TESTING:  Sidelying Dix-hallpike R: (-) -- some dizziness/unsteadiness with  return to sitting Sidelying Dix-hallpike L: (-) -- some dizziness/unsteadiness with return to sitting   MOTION SENSITIVITY: not performed   OTHOSTATICS: not done  ASSESSMENT:  CLINICAL IMPRESSION: Patient arrived to session without complaints. Vitals at start of session were [facility] and patient asymptomatic. Worked on dynamic balance activities with bending and head movements incorporated. Patient reported mild dizziness/lightheadedness with quick head nods, addressed with short rest break to resolve. Also with more intense c/o lightheadedness with bending activities. Patient's sx do seem orthostatic in nature- he reports efforts to improve hydration at home. Patient reported understanding of edu provided and without complaints upon leaving   OBJECTIVE IMPAIRMENTS: Abnormal gait, decreased balance, decreased coordination, decreased endurance, decreased mobility, difficulty walking, decreased ROM, decreased strength, dizziness, impaired UE functional use, improper body mechanics, postural dysfunction, and pain.   ACTIVITY LIMITATIONS: lifting, bending, standing, squatting, stairs, transfers, bed mobility, locomotion level, and caring for others  PARTICIPATION LIMITATIONS: shopping, community activity, and yard work  PERSONAL FACTORS: Age, Fitness, Past/current experiences, and Time since onset of injury/illness/exacerbation are also affecting patient's functional outcome.   REHAB POTENTIAL: Good  CLINICAL DECISION MAKING: Evolving/moderate complexity  EVALUATION COMPLEXITY: Moderate   GOALS: Goals reviewed with patient? Yes  SHORT TERM GOALS: Target date: 11/27/2023  Pt will be ind with initial HEP Baseline: pt reports performing HEP 11/25/23 Goal status: MET 11/25/23  2.  Pt will report improved dizziness by >/=25% Baseline: pt reports remaining lightheadedness  but denies dizziness 11/25/23 Goal status: NOT MET 11/25/23   LONG TERM GOALS: Target date: 12/25/2023   Pt will be ind with management and progression of HEP Baseline:  Goal status: IN PROGRESS  2.  Pt will have improved FGA to >/=28/30 for reduced fall risk Baseline: 21 Goal status: IN PROGRESS  3.  Pt will have improved gait speed to >/=1.1 m/s to be considered high functioning Baseline: 0.97 m/s Goal status: IN PROGRESS  4.  Pt will have improved PSFS average score to >/=8 to demo MCID Baseline: 6 Goal status: IN PROGRESS  5.  Pt will report improved dizzy symptoms by >/=50% Baseline:  Goal status: IN PROGRESS    PLAN:  PT FREQUENCY: 2x/week  PT DURATION: 8 weeks  PLANNED INTERVENTIONS: [id number]- PT Re-evaluation, 97750- Physical Performance Testing, 97110-Therapeutic exercises, 97530- Therapeutic activity, [id number]- Neuromuscular re-education, 97535- Self Care, [id number]- Manual therapy, [phone]- Gait training, [phone]- Canalith repositioning, [id number]- Aquatic Therapy, [phone]- Electrical stimulation (unattended), 20560 (1-2 muscles), 20561 (3+ muscles)- Dry Needling, Patient/Family education, Balance training, Stair training, Taping, Joint mobilization, Spinal mobilization, Vestibular training, Cryotherapy, and Moist heat  PLAN FOR NEXT SESSION: watch for orthostatic sx with bending; continue work on reactive balance, and dynamic visual acuity. Initiate/progress strength, balance, gaze stabilization HEP.    Louana Terrilyn Christians, PT, DPT 11/25/23 9:31 AM  [facility] at [facility] [address] [place], [address] [place], [place] [address] Phone # [phone] Fax # [phone]

## 2023-11-25 ENCOUNTER — Encounter: Payer: Self-pay | Admitting: Physical Therapy

## 2023-11-25 ENCOUNTER — Ambulatory Visit: Admitting: Physical Therapy

## 2023-11-25 DIAGNOSIS — R2681 Unsteadiness on feet: Secondary | ICD-10-CM

## 2023-11-25 DIAGNOSIS — R2689 Other abnormalities of gait and mobility: Secondary | ICD-10-CM

## 2023-11-25 DIAGNOSIS — M6281 Muscle weakness (generalized): Secondary | ICD-10-CM | POA: Diagnosis not present

## 2023-11-26 DIAGNOSIS — L821 Other seborrheic keratosis: Secondary | ICD-10-CM | POA: Diagnosis not present

## 2023-11-26 DIAGNOSIS — L578 Other skin changes due to chronic exposure to nonionizing radiation: Secondary | ICD-10-CM | POA: Diagnosis not present

## 2023-11-26 DIAGNOSIS — D225 Melanocytic nevi of trunk: Secondary | ICD-10-CM | POA: Diagnosis not present

## 2023-11-26 DIAGNOSIS — Z85828 Personal history of other malignant neoplasm of skin: Secondary | ICD-10-CM | POA: Diagnosis not present

## 2023-11-26 DIAGNOSIS — L814 Other melanin hyperpigmentation: Secondary | ICD-10-CM | POA: Diagnosis not present

## 2023-11-26 DIAGNOSIS — L570 Actinic keratosis: Secondary | ICD-10-CM | POA: Diagnosis not present

## 2023-11-27 ENCOUNTER — Encounter: Payer: Self-pay | Admitting: Physical Therapy

## 2023-11-27 ENCOUNTER — Ambulatory Visit: Admitting: Physical Therapy

## 2023-11-27 DIAGNOSIS — R2689 Other abnormalities of gait and mobility: Secondary | ICD-10-CM | POA: Diagnosis not present

## 2023-11-27 DIAGNOSIS — R2681 Unsteadiness on feet: Secondary | ICD-10-CM

## 2023-11-27 DIAGNOSIS — M6281 Muscle weakness (generalized): Secondary | ICD-10-CM | POA: Diagnosis not present

## 2023-11-27 NOTE — Therapy (Signed)
 OUTPATIENT PHYSICAL THERAPY LOWER EXTREMITY TREATMENT   Patient Name: Brandon Flores MRN: [id number] DOB:05/16/1945, 78 y.o., male Today's Date: 11/27/2023  END OF SESSION:  PT End of Session - 11/27/23 0846     Visit Number 8    Number of Visits 16    Date for PT Re-Evaluation 12/25/23    Authorization Type Medicare    Progress Note Due on Visit 10    PT Start Time 0847    PT Stop Time 0933    PT Time Calculation (min) 46 min    Equipment Utilized During Treatment Gait belt    Activity Tolerance Patient tolerated treatment well    Behavior During Therapy WFL for tasks assessed/performed              Past Medical History:  Diagnosis Date   Cervical radiculopathy    Colon polyp    (2006- adenoma and tubulovillous adenoma; 2010-adenomas; repeat 201)-Dr. Vicci   Complex partial seizure disorder (HCC) 10/08/2014   COVID-19 2021   GERD (gastroesophageal reflux disease)    Gout    Hypercholesterolemia    Lateral epicondylitis    2002   Migraine    headaches   Ophthalmalgia    Dr. Roz   Orthodontics    Dr. Hughie   Partial epilepsy with impairment of consciousness (HCC)    Plantar fasciitis    Prostate cancer (HCC)    Seizure disorder (HCC)    Dr. Cleotilde ([facility])   Seizures [facility])    Traumatic incomplete tear of left rotator cuff 2024   UTI (urinary tract infection)    Vertebrobasilar insufficiency    Vertigo    Past Surgical History:  Procedure Laterality Date   BACK SURGERY     HEMORROIDECTOMY     NECK SURGERY     PROSTATE BIOPSY     Patient Active Problem List   Diagnosis Date Noted   Debility 09/23/2023   Sepsis (HCC) 09/19/2023   Acute cystitis with hematuria 09/19/2023   TIA (transient ischemic attack) 09/19/2023   Encephalomalacia 12/06/2021   Malignant neoplasm of prostate (HCC) 02/03/2020   Obesity (BMI 30-39.9) 08/31/2016   Chronic neck pain 08/31/2016   Cervical radiculopathy 05/03/2016   HLD (hyperlipidemia) 05/03/2016   GERD  (gastroesophageal reflux disease) 04/25/2016   Hyperuricemia 04/25/2016   Low back pain without sciatica 04/25/2016   Degenerative arthritis of left knee 04/25/2016   Metatarsalgia of both feet 04/25/2016   Gout 04/25/2016   Migraine headache 04/25/2016   Nuclear sclerosis of both eyes 04/25/2016   History of colonic polyps 04/25/2016   VBI (vertebrobasilar insufficiency) 04/25/2016   Vertigo 03/10/2014   Generalized convulsive epilepsy (HCC) 11/12/2013   Choroidal nevus, right 10/12/2013   Vitreous floaters 10/12/2013    PCP: Hughie Sharper, MD  REFERRING PROVIDER: Hughie Sharper, MD  REFERRING DIAG: R53.81 (ICD-10-CM) - Debility  THERAPY DIAG:  Unsteadiness on feet  Other abnormalities of gait and mobility  Rationale for Evaluation and Treatment: Rehabilitation  ONSET DATE: 09/28/23  SUBJECTIVE:   SUBJECTIVE STATEMENT: Patient reports increased lightheaded after standing up from a chair this AM. Reports that he felt fine while driving over here, but had another episode when he stood up to get out.     PERTINENT HISTORY: history of left temporal hemorrhage with encephalomalacia, petit mall disorder, BPPV, vertebrobasilar insufficiency, prostate cancer, gait disorder who was admitted to hospital on 09/19/2023 with dizziness and fall with inability to get up.   Admitted to CIR 09/23/23 and  discharged home 09/28/23  PAIN:  Are you having pain? Yes: NPRS scale: no Pain location: R knee Pain description: sharp, maybe like it might give way Aggravating factors: steps, walking long distances Relieving factors: slowed pace   PRECAUTIONS: Fall  RED FLAGS: None   WEIGHT BEARING RESTRICTIONS: No  FALLS:  Has patient fallen in last 6 months? No  LIVING ENVIRONMENT: Lives with: lives with their spouse Lives in: House/apartment Stairs: 3 steps in the front, side door is flat entry, 14-16 stairs inside house (L side rail) goes down one step at a time Has following  equipment at home: Single point cane, Quad cane small base, Quad cane large base, and Walker - 2 wheeled  OCCUPATION: Retired; watching TV most days and reading, tries to go out once a day and walk in the park  PLOF: Independent  PATIENT GOALS: Improve walking and balance  NEXT MD VISIT: n/a  OBJECTIVE:     TODAY'S TREATMENT: 11/27/23 Activity Comments  Vitals sititng: 131/80, HR 73   Standing:  109/67 HR 86 bpm   Standing after 3 min Vitals 114/69 HR 93   R DH Negative; mild lightheadedness upon sitting up  L DH C/o increased dizziness; persisting L beating nystagmus   R roll test Geotropic nystagmus lasting ~15 sec; no dizziness   L roll test  Geotropic nystagmus lasting 30 sec; c/o dizziness   Gufoni for L horizontal canalithiasis  Tolerated well       PATIENT EDUCATION: Education details: edu on BPPV and post-CRM expectations  Person educated: Patient Education method: Explanation Education comprehension: verbalized understanding     HOME EXERCISE PROGRAM: Access Code: [email or web address] URL: [email or web address] Date: 11/04/2023 Prepared by: Burnard Sandifer  Exercises - Resisted Sit-to-Stand With Dumbbell at Chest  - 1 x daily - 7 x weekly - 3 sets - 10 reps - Corner Balance Feet Together With Eyes Closed  - 1 x daily - 7 x weekly - 3 sets - 10 reps - Standing Bent Over Single Arm Scapular Row with Table Support  - 1 x daily - 7 x weekly - 3 sets - 10 reps - Corner Balance Feet Together: Eyes Closed With Head Turns  - 1 x daily - 7 x weekly - 1 sets - 5 reps     Note: Objective measures were completed at Evaluation unless otherwise noted.  DIAGNOSTIC FINDINGS: 09/19/23 Brain MRI IMPRESSION: 1. No evidence of acute intracranial abnormality. 2. Sequela of remote hemorrhage in the anterior left temporal lobe  CTA head heck was negative for LVO and showed chronic R-VA occlusion or poor flow, moderate stenosis proximal BA, high grade stenosis R-ICA 65-70%,  severe stenosis L-ACA origin, 50% stenosis L-CCA and L-ICA bulb and up to moderate stenosis distal left PCA, R-ACA A2 and L-MCA M1 segment. CT abdomen pelvis done revealing bladder wall thickening with pericystic edema suspicious for cystitis and hyperattenuation in dependent bladder favored to be early contrast secretion rather than new bladder stone. 2D echo showed EF 60-65% with mild LVH.   PATIENT SURVEYS:  PSFS: THE PATIENT SPECIFIC FUNCTIONAL SCALE  Place score of 0-10 (0 = unable to perform activity and 10 = able to perform activity at the same level as before injury or problem)  Activity Date: 10/30/23    Walking 7 (catches himself more to get his balance, shaky)    2.  Standing balance 5     3.     4.      Total Score 6  Total Score = Sum of activity scores/number of activities  Minimally Detectable Change: 3 points (for single activity); 2 points (for average score)  [facility] (nd). The Patient Specific Functional Scale . Retrieved from [email or web address]   COGNITION: Overall cognitive status: Within functional limits for tasks assessed     SENSATION: Neuropathy in bilat feet but can feel more in his arch  EDEMA:  None  MUSCLE LENGTH: Did not assess  PALPATION: Did not assess  LOWER EXTREMITY ROM: WNL  LOWER EXTREMITY MMT:  MMT Right eval Left eval  Hip flexion 4+ 4  Hip extension    Hip abduction 5 5  Hip adduction    Hip internal rotation    Hip external rotation    Knee flexion 4+ 4+  Knee extension 4+ 4+  Ankle dorsiflexion    Ankle plantarflexion    Ankle inversion    Ankle eversion     (Blank rows = not tested)  LOWER EXTREMITY SPECIAL TESTS:  Did not assess  FUNCTIONAL TESTS:  5 times sit to stand: 8.14 sec; LEs against bed for stability, hands on knees FGA: 21/30  M-CTSIB  Condition 1: Firm Surface, EO 30 Sec, Normal Sway  Condition 2: Firm Surface, EC 30 Sec,  Mild Sway  Condition 3: Foam Surface, EO 30 Sec, Mild Sway  Condition 4: Foam Surface, EC 20 Sec, Moderate and Severe Sway      GAIT: Distance walked: Into clinic; at least 200' Assistive device utilized: None Level of assistance: Complete Independence Comments: Amb 10 meters in 10.26 sec = 0.97 m/s. Widened BOS, R foot externally rotated, slap foot R>L  VESTIBULAR ASSESSMENT:  GENERAL OBSERVATION: n/a   SYMPTOM BEHAVIOR:  Subjective history: Reports no attacks of imbalance and unsteadiness since leaving the hospital but general unsteadiness. Has had PT in the past for dizziness. Went to one clinic and got what sounds like canalith repositioning and then went to another clinic who performed primarily balance work.   Non-Vestibular symptoms: None  Type of dizziness: Imbalance (Disequilibrium), Unsteady with head/body turns, and Lightheadedness/Faint  Frequency: No attacks since leaving the hospital but still feels unsteady  Duration: none  Aggravating factors: No known aggravating factors  Relieving factors: no known relieving factors  Progression of symptoms: better  OCULOMOTOR EXAM:  Ocular Alignment: normal  Ocular ROM: No Limitations  Spontaneous Nystagmus: absent  Gaze-Induced Nystagmus: absent  Smooth Pursuits: intact  Saccades: intact   R eye has one black dot and L eye has multiple black dots and floaters but pt reports this is normal  FRENZEL - FIXATION SUPRESSED: Did not assesss  VESTIBULAR - OCULAR REFLEX:   Slow VOR: Normal  VOR Cancellation: Normal  Head-Impulse Test: HIT Right: negative HIT Left: positive  Dynamic Visual Acuity: TBA   POSITIONAL TESTING:  Sidelying [place]pike R: (-) -- some dizziness/unsteadiness with return to sitting Sidelying [place] L: (-) -- some dizziness/unsteadiness with return to sitting   MOTION SENSITIVITY: not performed   OTHOSTATICS: not done  ASSESSMENT:  CLINICAL IMPRESSION: Patient arrived to session with report of increased lightheaded after standing up from a chair this AM and had another episode when he stood up from the car. Positional testing was positive for L horizontal canalithiasis, treated with L Gufoni x1. Patient tolerated session well and stated that he felt safe to drive.   OBJECTIVE IMPAIRMENTS: Abnormal gait, decreased balance, decreased coordination, decreased endurance, decreased mobility, difficulty walking, decreased ROM, decreased strength, dizziness, impaired UE functional use, improper body mechanics, postural dysfunction, and pain.   ACTIVITY LIMITATIONS: lifting, bending, standing, squatting, stairs, transfers, bed mobility, locomotion level, and caring for others  PARTICIPATION LIMITATIONS: shopping, community activity, and yard work  PERSONAL FACTORS: Age, Fitness, Past/current experiences, and Time since onset of injury/illness/exacerbation are also affecting patient's functional outcome.   REHAB POTENTIAL: Good  CLINICAL DECISION MAKING: Evolving/moderate complexity  EVALUATION COMPLEXITY: Moderate   GOALS: Goals reviewed with patient? Yes  SHORT TERM GOALS: Target date: 11/27/2023  Pt will be ind with initial HEP Baseline: pt reports performing HEP 11/25/23 Goal status: MET 11/25/23  2.  Pt will report improved dizziness by >/=25% Baseline: pt reports remaining lightheadedness but denies dizziness 11/25/23 Goal status: NOT MET 11/25/23   LONG TERM GOALS: Target date: 12/25/2023   Pt will be ind with management and progression of HEP Baseline:  Goal status: IN PROGRESS  2.  Pt will have improved FGA to >/=28/30 for reduced fall risk Baseline: 21 Goal status: IN PROGRESS  3.  Pt will have improved gait speed to >/=1.1 m/s to be considered high functioning Baseline: 0.97 m/s Goal status: IN PROGRESS  4.  Pt will have improved  PSFS average score to >/=8 to demo MCID Baseline: 6 Goal status: IN PROGRESS  5.  Pt will report improved dizzy symptoms by >/=50% Baseline:  Goal status: IN PROGRESS    PLAN:  PT FREQUENCY: 2x/week  PT DURATION: 8 weeks  PLANNED INTERVENTIONS: [id number]- PT Re-evaluation, 97750- Physical Performance Testing, 97110-Therapeutic exercises, 97530- Therapeutic activity, [id number]- Neuromuscular re-education, 97535- Self Care, [id number]- Manual therapy, [phone]- Gait training, [phone]- Canalith repositioning, [id number]- Aquatic Therapy, [phone]- Electrical stimulation (unattended), 20560 (1-2 muscles), 20561 (3+ muscles)- Dry Needling, Patient/Family education, Balance training, Stair training, Taping, Joint mobilization, Spinal mobilization, Vestibular training, Cryotherapy, and Moist heat  PLAN FOR NEXT SESSION: recheck L/R roll test; watch for orthostatic sx with bending; continue work on reactive balance, and dynamic visual acuity. Initiate/progress strength, balance, gaze stabilization HEP.    Louana Terrilyn Christians, PT, DPT 11/27/23 9:36 AM  [facility] [address] [place], [address] [place], [place] [address] Phone # [phone] Fax # [phone]

## 2023-12-03 ENCOUNTER — Encounter: Payer: Self-pay | Admitting: Physical Therapy

## 2023-12-03 ENCOUNTER — Ambulatory Visit: Attending: Physician Assistant | Admitting: Physical Therapy

## 2023-12-03 DIAGNOSIS — R2689 Other abnormalities of gait and mobility: Secondary | ICD-10-CM | POA: Insufficient documentation

## 2023-12-03 DIAGNOSIS — M6281 Muscle weakness (generalized): Secondary | ICD-10-CM | POA: Insufficient documentation

## 2023-12-03 DIAGNOSIS — R2681 Unsteadiness on feet: Secondary | ICD-10-CM | POA: Insufficient documentation

## 2023-12-03 NOTE — Therapy (Signed)
 OUTPATIENT PHYSICAL THERAPY LOWER EXTREMITY TREATMENT   Patient Name: Brandon Flores MRN: [id number] DOB:[birth year], 78 y.o., male Today's Date: 12/03/2023  END OF SESSION:  PT End of Session - 12/03/23 0852     Visit Number 9    Number of Visits 16    Date for PT Re-Evaluation 12/25/23    Authorization Type Medicare    Progress Note Due on Visit 10    PT Start Time 0852    PT Stop Time 0931    PT Time Calculation (min) 39 min    Equipment Utilized During Treatment Gait belt    Activity Tolerance Patient tolerated treatment well    Behavior During Therapy WFL for tasks assessed/performed               Past Medical History:  Diagnosis Date   Cervical radiculopathy    Colon polyp    (2006- adenoma and tubulovillous adenoma; 2010-adenomas; repeat 201)-Dr. Vicci   Complex partial seizure disorder (HCC) 10/08/2014   COVID-19 2021   GERD (gastroesophageal reflux disease)    Gout    Hypercholesterolemia    Lateral epicondylitis    2002   Migraine    headaches   Ophthalmalgia    Dr. Roz   Orthodontics    Dr. Hughie   Partial epilepsy with impairment of consciousness (HCC)    Plantar fasciitis    Prostate cancer (HCC)    Seizure disorder (HCC)    Dr. Cleotilde ([facility])   Seizures [facility])    Traumatic incomplete tear of left rotator cuff 2024   UTI (urinary tract infection)    Vertebrobasilar insufficiency    Vertigo    Past Surgical History:  Procedure Laterality Date   BACK SURGERY     HEMORROIDECTOMY     NECK SURGERY     PROSTATE BIOPSY     Patient Active Problem List   Diagnosis Date Noted   Debility 09/23/2023   Sepsis (HCC) 09/19/2023   Acute cystitis with hematuria 09/19/2023   TIA (transient ischemic attack) 09/19/2023   Encephalomalacia 12/06/2021   Malignant neoplasm of prostate (HCC) 02/03/2020   Obesity (BMI 30-39.9) 08/31/2016   Chronic neck pain 08/31/2016   Cervical radiculopathy 05/03/2016   HLD (hyperlipidemia) 05/03/2016   GERD  (gastroesophageal reflux disease) 04/25/2016   Hyperuricemia 04/25/2016   Low back pain without sciatica 04/25/2016   Degenerative arthritis of left knee 04/25/2016   Metatarsalgia of both feet 04/25/2016   Gout 04/25/2016   Migraine headache 04/25/2016   Nuclear sclerosis of both eyes 04/25/2016   History of colonic polyps 04/25/2016   VBI (vertebrobasilar insufficiency) 04/25/2016   Vertigo 03/10/2014   Generalized convulsive epilepsy (HCC) 11/12/2013   Choroidal nevus, right 10/12/2013   Vitreous floaters 10/12/2013    PCP: Hughie Sharper, MD  REFERRING PROVIDER: Hughie Sharper, MD  REFERRING DIAG: R53.81 (ICD-10-CM) - Debility  THERAPY DIAG:  Unsteadiness on feet  Other abnormalities of gait and mobility  Rationale for Evaluation and Treatment: Rehabilitation  ONSET DATE: 09/28/23  SUBJECTIVE:   SUBJECTIVE STATEMENT: Still having a little bit of dizziness; it goes away pretty quickly.  Did do a lot of yardwork over the weekend.  When I bend down and pick up something, the coming up brings dizziness.     PERTINENT HISTORY: history of left temporal hemorrhage with encephalomalacia, petit mall disorder, BPPV, vertebrobasilar insufficiency, prostate cancer, gait disorder who was admitted to hospital on 09/19/2023 with dizziness and fall with inability to get up.  Admitted to CIR 09/23/23 and discharged home 09/28/23  PAIN:  Are you having pain? Yes: NPRS scale: no Pain location: R knee Pain description: sharp, maybe like it might give way Aggravating factors: steps, walking long distances Relieving factors: slowed pace   PRECAUTIONS: Fall  RED FLAGS: None   WEIGHT BEARING RESTRICTIONS: No  FALLS:  Has patient fallen in last 6 months? No  LIVING ENVIRONMENT: Lives with: lives with their spouse Lives in: House/apartment Stairs: 3 steps in the front, side door is flat entry, 14-16 stairs inside house (L side rail) goes down one step at a time Has  following equipment at home: Single point cane, Quad cane small base, Quad cane large base, and Walker - 2 wheeled  OCCUPATION: Retired; watching TV most days and reading, tries to go out once a day and walk in the park  PLOF: Independent  PATIENT GOALS: Improve walking and balance  NEXT MD VISIT: n/a  OBJECTIVE:    TODAY'S TREATMENT: 12/03/2023 Activity Comments  Roll test, R Negative  Roll test, L negative  R DH negative  L DH negative  Forward bend to target>upright sitting, x 10 reps, 3 sets 1-2/10 quick dizziness 1st and 2nd trial; 1-2/10 3rd trial  Seated vertical gaze stabilization x 30 sec, 2 sets No dizziness  Corner balance: -Feet apart EO/EC head turns/nods 30 sec -Feet together EO/EC head turns/nods 30 sec  Unsteadiness  Increased sway   Wall bumps-2 x 10 reps EO and EC, fast pace  Standing squat with wide BOS to touch cones and return to stand, 2 x 5 reps  Increased ant/post ankle sway  Staggered stance sit to stand Better stability through ankles/hips         PATIENT EDUCATION: Education details:Stagger stand position for sit to stand for better initial standing balance Person educated: Patient Education method: Explanation Education comprehension: verbalized understanding     HOME EXERCISE PROGRAM: Access Code: [email or web address] URL: [email or web address] Date: 11/04/2023 Prepared by: Burnard Sandifer  Exercises - Resisted Sit-to-Stand With Dumbbell at Chest  - 1 x daily - 7 x weekly - 3 sets - 10 reps - Corner Balance Feet Together With Eyes Closed  - 1 x daily - 7 x weekly - 3 sets - 10 reps - Standing Bent Over Single Arm Scapular Row with Table Support  - 1 x daily - 7 x weekly - 3 sets - 10 reps - Corner Balance Feet Together: Eyes Closed With Head Turns  - 1 x daily - 7 x weekly - 1 sets - 5 reps     Note: Objective measures were completed at Evaluation unless otherwise noted.  DIAGNOSTIC FINDINGS: 09/19/23 Brain MRI IMPRESSION: 1. No  evidence of acute intracranial abnormality. 2. Sequela of remote hemorrhage in the anterior left temporal lobe  CTA head heck was negative for LVO and showed chronic R-VA occlusion or poor flow, moderate stenosis proximal BA, high grade stenosis R-ICA 65-70%, severe stenosis L-ACA origin, 50% stenosis L-CCA and L-ICA bulb and up to moderate stenosis distal left PCA, R-ACA A2 and L-MCA M1 segment. CT abdomen pelvis done revealing bladder wall thickening with pericystic edema suspicious for cystitis and hyperattenuation in dependent bladder favored to be early contrast secretion rather than new bladder stone. 2D echo showed EF 60-65% with mild LVH.   PATIENT SURVEYS:  PSFS: THE PATIENT SPECIFIC FUNCTIONAL SCALE  Place score of 0-10 (0 = unable to perform activity and 10 = able to perform activity at the same level as before  injury or problem)  Activity Date: 10/30/23    Walking 7 (catches himself more to get his balance, shaky)    2.  Standing balance 5     3.     4.      Total Score 6      Total Score = Sum of activity scores/number of activities  Minimally Detectable Change: 3 points (for single activity); 2 points (for average score)  [facility] (nd). The Patient Specific Functional Scale . Retrieved from [email or web address]   COGNITION: Overall cognitive status: Within functional limits for tasks assessed     SENSATION: Neuropathy in bilat feet but can feel more in his arch  EDEMA:  None  MUSCLE LENGTH: Did not assess  PALPATION: Did not assess  LOWER EXTREMITY ROM: WNL  LOWER EXTREMITY MMT:  MMT Right eval Left eval  Hip flexion 4+ 4  Hip extension    Hip abduction 5 5  Hip adduction    Hip internal rotation    Hip external rotation    Knee flexion 4+ 4+  Knee extension 4+ 4+  Ankle dorsiflexion    Ankle plantarflexion    Ankle inversion    Ankle eversion     (Blank rows = not  tested)  LOWER EXTREMITY SPECIAL TESTS:  Did not assess  FUNCTIONAL TESTS:  5 times sit to stand: 8.14 sec; LEs against bed for stability, hands on knees FGA: 21/30  M-CTSIB  Condition 1: Firm Surface, EO 30 Sec, Normal Sway  Condition 2: Firm Surface, EC 30 Sec, Mild Sway  Condition 3: Foam Surface, EO 30 Sec, Mild Sway  Condition 4: Foam Surface, EC 20 Sec, Moderate and Severe Sway      GAIT: Distance walked: Into clinic; at least 200' Assistive device utilized: None Level of assistance: Complete Independence Comments: Amb 10 meters in 10.26 sec = 0.97 m/s. Widened BOS, R foot externally rotated, slap foot R>L  VESTIBULAR ASSESSMENT:  GENERAL OBSERVATION: n/a   SYMPTOM BEHAVIOR:  Subjective history: Reports no attacks of imbalance and unsteadiness since leaving the hospital but general unsteadiness. Has had PT in the past for dizziness. Went to one clinic and got what sounds like canalith repositioning and then went to another clinic who performed primarily balance work.   Non-Vestibular symptoms: None  Type of dizziness: Imbalance (Disequilibrium), Unsteady with head/body turns, and Lightheadedness/Faint  Frequency: No attacks since leaving the hospital but still feels unsteady  Duration: none  Aggravating factors: No known aggravating factors  Relieving factors: no known relieving factors  Progression of symptoms: better  OCULOMOTOR EXAM:  Ocular Alignment: normal  Ocular ROM: No Limitations  Spontaneous Nystagmus: absent  Gaze-Induced Nystagmus: absent  Smooth Pursuits: intact  Saccades: intact   R eye has one black dot and L eye has multiple black dots and floaters but pt reports this is normal  FRENZEL - FIXATION SUPRESSED: Did not assesss  VESTIBULAR - OCULAR REFLEX:   Slow VOR: Normal  VOR Cancellation: Normal  Head-Impulse Test: HIT Right: negative HIT Left: positive  Dynamic Visual Acuity: TBA   POSITIONAL TESTING:  Sidelying [place]-hallpike R: (-)  -- some dizziness/unsteadiness with return to sitting Sidelying [place]-hallpike L: (-) -- some dizziness/unsteadiness with return to sitting   MOTION SENSITIVITY: not performed   OTHOSTATICS: not done  ASSESSMENT:  CLINICAL IMPRESSION: Pt presents today with feeling of improved dizziness since last visit.  Reassessed positional vertigo and no dizziness, no nystagmus noted.  Worked on progressions of balance exercises, including wall bumps, squat activities, bending forward>upright posture.  This motion of return up from forward bending position brings on brief symptoms; it does not seem to habituate with repetition of motion.  He does note that he has excess sway through ankles, causing him to feel unsteady; he has improved steadiness with sit to stand with slightly staggered foot pattern.  He has no complaints upon leaving therapy session today and he would continue to benefit from skilled PT towards goals for improved functional mobility and decreased fall risk.  OBJECTIVE IMPAIRMENTS: Abnormal gait, decreased balance, decreased coordination, decreased endurance, decreased mobility, difficulty walking, decreased ROM, decreased strength, dizziness, impaired UE functional use, improper body mechanics, postural dysfunction, and pain.   ACTIVITY LIMITATIONS: lifting, bending, standing, squatting, stairs, transfers, bed mobility, locomotion level, and caring for others  PARTICIPATION LIMITATIONS: shopping, community activity, and yard work  PERSONAL FACTORS: Age, Fitness, Past/current experiences, and Time since onset of injury/illness/exacerbation are also affecting patient's functional outcome.   REHAB POTENTIAL: Good  CLINICAL DECISION MAKING: Evolving/moderate complexity  EVALUATION COMPLEXITY: Moderate   GOALS: Goals reviewed with patient? Yes  SHORT TERM GOALS:  Target date: 11/27/2023  Pt will be ind with initial HEP Baseline: pt reports performing HEP 11/25/23 Goal status: MET 11/25/23  2.  Pt will report improved dizziness by >/=25% Baseline: pt reports remaining lightheadedness but denies dizziness 11/25/23 Goal status: NOT MET 11/25/23   LONG TERM GOALS: Target date: 12/25/2023   Pt will be ind with management and progression of HEP Baseline:  Goal status: IN PROGRESS  2.  Pt will have improved FGA to >/=28/30 for reduced fall risk Baseline: 21 Goal status: IN PROGRESS  3.  Pt will have improved gait speed to >/=1.1 m/s to be considered high functioning Baseline: 0.97 m/s Goal status: IN PROGRESS  4.  Pt will have improved PSFS average score to >/=8 to demo MCID Baseline: 6 Goal status: IN PROGRESS  5.  Pt will report improved dizzy symptoms by >/=50% Baseline:  Goal status: IN PROGRESS    PLAN:  PT FREQUENCY: 2x/week  PT DURATION: 8 weeks  PLANNED INTERVENTIONS: [id number]- PT Re-evaluation, 97750- Physical Performance Testing, 97110-Therapeutic exercises, 97530- Therapeutic activity, [id number]- Neuromuscular re-education, 97535- Self Care, [id number]- Manual therapy, [phone]- Gait training, [phone]- Canalith repositioning, [id number]- Aquatic Therapy, [phone]- Electrical stimulation (unattended), 20560 (1-2 muscles), 20561 (3+ muscles)- Dry Needling, Patient/Family education, Balance training, Stair training, Taping, Joint mobilization, Spinal mobilization, Vestibular training, Cryotherapy, and Moist heat  PLAN FOR NEXT SESSION: Recheck for BPPV as needed; watch for orthostatic sx with bending; continue work on reactive balance, and dynamic visual acuity. Progress balance, gaze stabilization HEP.    Greig Anon, PT 12/03/23 4:42 PM Phone: [phone] Fax: [phone]  [facility] at [facility] [address] [place], [address] [place], [place] [address] Phone # [phone] Fax # [phone]

## 2023-12-05 ENCOUNTER — Encounter: Payer: Self-pay | Admitting: Physical Therapy

## 2023-12-05 ENCOUNTER — Ambulatory Visit: Admitting: Physical Therapy

## 2023-12-05 DIAGNOSIS — R2681 Unsteadiness on feet: Secondary | ICD-10-CM | POA: Diagnosis not present

## 2023-12-05 DIAGNOSIS — M6281 Muscle weakness (generalized): Secondary | ICD-10-CM

## 2023-12-05 DIAGNOSIS — R2689 Other abnormalities of gait and mobility: Secondary | ICD-10-CM

## 2023-12-05 NOTE — Therapy (Signed)
 OUTPATIENT PHYSICAL THERAPY LOWER EXTREMITY TREATMENT/PROGRESS NOTE   Patient Name: Brandon Flores MRN: [id number] DOB:[birth year], 78 y.o., male Today's Date: 12/05/2023  Progress Note Reporting Period 10/30/2023 to 12/05/2023  See note below for Objective Data and Assessment of Progress/Goals.      END OF SESSION:  PT End of Session - 12/05/23 0851     Visit Number 10    Number of Visits 16    Date for PT Re-Evaluation 12/25/23    Authorization Type Medicare    Progress Note Due on Visit 10    PT Start Time 0849    PT Stop Time 0930    PT Time Calculation (min) 41 min    Equipment Utilized During Treatment Gait belt    Activity Tolerance Patient tolerated treatment well    Behavior During Therapy WFL for tasks assessed/performed                Past Medical History:  Diagnosis Date   Cervical radiculopathy    Colon polyp    (2006- adenoma and tubulovillous adenoma; 2010-adenomas; repeat 201)-Dr. Vicci   Complex partial seizure disorder (HCC) 10/08/2014   COVID-19 2021   GERD (gastroesophageal reflux disease)    Gout    Hypercholesterolemia    Lateral epicondylitis    2002   Migraine    headaches   Ophthalmalgia    Dr. Roz   Orthodontics    Dr. Hughie   Partial epilepsy with impairment of consciousness (HCC)    Plantar fasciitis    Prostate cancer (HCC)    Seizure disorder (HCC)    Dr. Cleotilde ([facility])   Seizures [facility])    Traumatic incomplete tear of left rotator cuff 2024   UTI (urinary tract infection)    Vertebrobasilar insufficiency    Vertigo    Past Surgical History:  Procedure Laterality Date   BACK SURGERY     HEMORROIDECTOMY     NECK SURGERY     PROSTATE BIOPSY     Patient Active Problem List   Diagnosis Date Noted   Debility 09/23/2023   Sepsis (HCC) 09/19/2023   Acute cystitis with hematuria 09/19/2023   TIA (transient ischemic attack) 09/19/2023   Encephalomalacia 12/06/2021   Malignant neoplasm of prostate (HCC) 02/03/2020    Obesity (BMI 30-39.9) 08/31/2016   Chronic neck pain 08/31/2016   Cervical radiculopathy 05/03/2016   HLD (hyperlipidemia) 05/03/2016   GERD (gastroesophageal reflux disease) 04/25/2016   Hyperuricemia 04/25/2016   Low back pain without sciatica 04/25/2016   Degenerative arthritis of left knee 04/25/2016   Metatarsalgia of both feet 04/25/2016   Gout 04/25/2016   Migraine headache 04/25/2016   Nuclear sclerosis of both eyes 04/25/2016   History of colonic polyps 04/25/2016   VBI (vertebrobasilar insufficiency) 04/25/2016   Vertigo 03/10/2014   Generalized convulsive epilepsy (HCC) 11/12/2013   Choroidal nevus, right 10/12/2013   Vitreous floaters 10/12/2013    PCP: Hughie Sharper, MD  REFERRING PROVIDER: Hughie Sharper, MD  REFERRING DIAG: R53.81 (ICD-10-CM) - Debility  THERAPY DIAG:  Unsteadiness on feet  Other abnormalities of gait and mobility  Muscle weakness (generalized)  Rationale for Evaluation and Treatment: Rehabilitation  ONSET DATE: 09/28/23  SUBJECTIVE:   SUBJECTIVE STATEMENT: Felt like I was being cautious on Wednesday, almost like a mini-vertigo attack.  Feel okay today.  No dizziness.     PERTINENT HISTORY: history of left temporal hemorrhage with encephalomalacia, petit mall disorder, BPPV, vertebrobasilar insufficiency, prostate cancer, gait disorder who was admitted to hospital  on 09/19/2023 with dizziness and fall with inability to get up.   Admitted to CIR 09/23/23 and discharged home 09/28/23  PAIN:  Are you having pain? Yes: NPRS scale: no Pain location: R knee Pain description: sharp, maybe like it might give way Aggravating factors: steps, walking long distances Relieving factors: slowed pace   PRECAUTIONS: Fall  RED FLAGS: None   WEIGHT BEARING RESTRICTIONS: No  FALLS:  Has patient fallen in last 6 months? No  LIVING ENVIRONMENT: Lives with: lives with their spouse Lives in: House/apartment Stairs: 3 steps in the front,  side door is flat entry, 14-16 stairs inside house (L side rail) goes down one step at a time Has following equipment at home: Single point cane, Quad cane small base, Quad cane large base, and Walker - 2 wheeled  OCCUPATION: Retired; watching TV most days and reading, tries to go out once a day and walk in the park  PLOF: Independent  PATIENT GOALS: Improve walking and balance  NEXT MD VISIT: n/a  OBJECTIVE:    TODAY'S TREATMENT: 12/05/2023 Activity Comments  Seated forward bend to upright posture No dizziness  Gait velocity 10.28 sec = 3.19 ft/sec 0.97 m/sec  FGA:  24/30 Improved from 21/30  Alt step taps to cones, then 180 turn and cone taps 3 reps, no symptoms  Forward/back stepping with up/down head motions No dizziness  On foam:  EO head turns/nods, then EC head turns/nods Feet apart/feet partial tandem  On foam:  heel/toe raises 10 reps EO, then 10 reps EC   Wall bumps EO, then EC        PATIENT EDUCATION: Education details:Continue current HEP; answered questions about balance on foam/multi-system sensory balance retraining Person educated: Patient Education method: Explanation Education comprehension: verbalized understanding     HOME EXERCISE PROGRAM: Access Code: [email or web address] URL: [email or web address] Date: 11/04/2023 Prepared by: Burnard Sandifer  Exercises - Resisted Sit-to-Stand With Dumbbell at Chest  - 1 x daily - 7 x weekly - 3 sets - 10 reps - Corner Balance Feet Together With Eyes Closed  - 1 x daily - 7 x weekly - 3 sets - 10 reps - Standing Bent Over Single Arm Scapular Row with Table Support  - 1 x daily - 7 x weekly - 3 sets - 10 reps - Corner Balance Feet Together: Eyes Closed With Head Turns  - 1 x daily - 7 x weekly - 1 sets - 5 reps     Note: Objective measures were completed at Evaluation unless otherwise noted.  DIAGNOSTIC FINDINGS: 09/19/23 Brain MRI IMPRESSION: 1. No evidence of acute intracranial abnormality. 2. Sequela of  remote hemorrhage in the anterior left temporal lobe  CTA head heck was negative for LVO and showed chronic R-VA occlusion or poor flow, moderate stenosis proximal BA, high grade stenosis R-ICA 65-70%, severe stenosis L-ACA origin, 50% stenosis L-CCA and L-ICA bulb and up to moderate stenosis distal left PCA, R-ACA A2 and L-MCA M1 segment. CT abdomen pelvis done revealing bladder wall thickening with pericystic edema suspicious for cystitis and hyperattenuation in dependent bladder favored to be early contrast secretion rather than new bladder stone. 2D echo showed EF 60-65% with mild LVH.   PATIENT SURVEYS:  PSFS: THE PATIENT SPECIFIC FUNCTIONAL SCALE  Place score of 0-10 (0 = unable to perform activity and 10 = able to perform activity at the same level as before injury or problem)  Activity Date: 10/30/23    Walking 7 (catches himself more to get his balance,  shaky)    2.  Standing balance 5     3.     4.      Total Score 6      Total Score = Sum of activity scores/number of activities  Minimally Detectable Change: 3 points (for single activity); 2 points (for average score)  [facility] (nd). The Patient Specific Functional Scale . Retrieved from [email or web address]   COGNITION: Overall cognitive status: Within functional limits for tasks assessed     SENSATION: Neuropathy in bilat feet but can feel more in his arch  EDEMA:  None  MUSCLE LENGTH: Did not assess  PALPATION: Did not assess  LOWER EXTREMITY ROM: WNL  LOWER EXTREMITY MMT:  MMT Right eval Left eval  Hip flexion 4+ 4  Hip extension    Hip abduction 5 5  Hip adduction    Hip internal rotation    Hip external rotation    Knee flexion 4+ 4+  Knee extension 4+ 4+  Ankle dorsiflexion    Ankle plantarflexion    Ankle inversion    Ankle eversion     (Blank rows = not tested)  LOWER EXTREMITY SPECIAL TESTS:  Did not assess  FUNCTIONAL  TESTS:  5 times sit to stand: 8.14 sec; LEs against bed for stability, hands on knees FGA: 21/30  [facility] PT Assessment - 12/05/23 0901       Functional Gait  Assessment   Gait assessed  Yes    Gait Level Surface Walks 20 ft in less than 7 sec but greater than 5.5 sec, uses assistive device, slower speed, mild gait deviations, or deviates 6-10 in outside of the 12 in walkway width.   6.62   Change in Gait Speed Able to change speed, demonstrates mild gait deviations, deviates 6-10 in outside of the 12 in walkway width, or no gait deviations, unable to achieve a major change in velocity, or uses a change in velocity, or uses an assistive device.    Gait with Horizontal Head Turns Performs head turns smoothly with slight change in gait velocity (eg, minor disruption to smooth gait path), deviates 6-10 in outside 12 in walkway width, or uses an assistive device.    Gait with Vertical Head Turns Performs head turns with no change in gait. Deviates no more than 6 in outside 12 in walkway width.    Gait and Pivot Turn Pivot turns safely within 3 sec and stops quickly with no loss of balance.    Step Over Obstacle Is able to step over 2 stacked shoe boxes taped together (9 in total height) without changing gait speed. No evidence of imbalance.    Gait with Narrow Base of Support Is able to ambulate for 10 steps heel to toe with no staggering.    Gait with Eyes Closed Walks 20 ft, slow speed, abnormal gait pattern, evidence for imbalance, deviates 10-15 in outside 12 in walkway width. Requires more than 9 sec to ambulate 20 ft.    Ambulating Backwards Walks 20 ft, no assistive devices, good speed, no evidence for imbalance, normal gait   10.28 sec   Steps Alternating feet, must use rail.    Total Score 24    FGA comment: improved from 21/30         M-CTSIB  Condition 1: Firm Surface, EO 30 Sec, Normal Sway  Condition 2: Firm Surface, EC 30 Sec, Mild Sway  Condition 3: Foam Surface, EO 30 Sec, Mild  Sway  Condition 4: Foam  Surface, EC 20 Sec, Moderate and Severe Sway      GAIT: Distance walked: Into clinic; at least 200' Assistive device utilized: None Level of assistance: Complete Independence Comments: Amb 10 meters in 10.26 sec = 0.97 m/s. Widened BOS, R foot externally rotated, slap foot R>L  VESTIBULAR ASSESSMENT:  GENERAL OBSERVATION: n/a   SYMPTOM BEHAVIOR:  Subjective history: Reports no attacks of imbalance and unsteadiness since leaving the hospital but general unsteadiness. Has had PT in the past for dizziness. Went to one clinic and got what sounds like canalith repositioning and then went to another clinic who performed primarily balance work.   Non-Vestibular symptoms: None  Type of dizziness: Imbalance (Disequilibrium), Unsteady with head/body turns, and Lightheadedness/Faint  Frequency: No attacks since leaving the hospital but still feels unsteady  Duration: none  Aggravating factors: No known aggravating factors  Relieving factors: no known relieving factors  Progression of symptoms: better  OCULOMOTOR EXAM:  Ocular Alignment: normal  Ocular ROM: No Limitations  Spontaneous Nystagmus: absent  Gaze-Induced Nystagmus: absent  Smooth Pursuits: intact  Saccades: intact   R eye has one black dot and L eye has multiple black dots and floaters but pt reports this is normal  FRENZEL - FIXATION SUPRESSED: Did not assesss  VESTIBULAR - OCULAR REFLEX:   Slow VOR: Normal  VOR Cancellation: Normal  Head-Impulse Test: HIT Right: negative HIT Left: positive  Dynamic Visual Acuity: TBA   POSITIONAL TESTING:  Sidelying [place]-hallpike R: (-) -- some dizziness/unsteadiness with return to sitting Sidelying [place]-hallpike L: (-) -- some dizziness/unsteadiness with return to sitting   MOTION SENSITIVITY: not performed   OTHOSTATICS: not done                                                                                                                                ASSESSMENT:  CLINICAL IMPRESSION: 10th Visit PN:  Pt presents today reporting no dizziness today at all.  He had a little yesterday, but feels overall very good today.  Assessed FGA, with score 24/30, improved from 21/30, indicating less fall risk.  Gait velocity is 0.97 m/s, same as eval.  Skilled PT session focused on multi-sensory balance exercises. Pt is able to perform all exercises today without dizziness.  Pt will continue to benefit from skilled PT towards goals for improved functional mobility and decreased fall risk.  OBJECTIVE IMPAIRMENTS: Abnormal gait, decreased balance, decreased coordination, decreased endurance, decreased mobility, difficulty walking, decreased ROM, decreased strength, dizziness, impaired UE functional use, improper body mechanics, postural dysfunction, and pain.   ACTIVITY LIMITATIONS: lifting, bending, standing, squatting, stairs, transfers, bed mobility, locomotion level, and caring for others  PARTICIPATION LIMITATIONS: shopping, community activity, and yard work  PERSONAL FACTORS: Age, Fitness, Past/current experiences, and Time since onset of injury/illness/exacerbation are also affecting patient's functional outcome.   REHAB POTENTIAL: Good  CLINICAL DECISION MAKING: Evolving/moderate complexity  EVALUATION COMPLEXITY: Moderate   GOALS: Goals reviewed with patient? Yes  SHORT  TERM GOALS: Target date: 11/27/2023  Pt will be ind with initial HEP Baseline: pt reports performing HEP 11/25/23 Goal status: MET 11/25/23  2.  Pt will report improved dizziness by >/=25% Baseline: pt reports remaining lightheadedness but denies dizziness 11/25/23 Goal status: NOT MET 11/25/23   LONG TERM GOALS: Target date: 12/25/2023   Pt will be ind with management and progression of HEP Baseline:  Goal status: IN PROGRESS  2.  Pt will have improved FGA to >/=28/30 for reduced fall risk Baseline: 21>24/30 Goal status: IN PROGRESS  3.  Pt will have improved  gait speed to >/=1.1 m/s to be considered high functioning Baseline: 0.97 m/s Goal status: IN PROGRESS  4.  Pt will have improved PSFS average score to >/=8 to demo MCID Baseline: 6 Goal status: IN PROGRESS  5.  Pt will report improved dizzy symptoms by >/=50% Baseline:  Goal status: IN PROGRESS    PLAN:  PT FREQUENCY: 2x/week  PT DURATION: 8 weeks  PLANNED INTERVENTIONS: [id number]- PT Re-evaluation, 97750- Physical Performance Testing, 97110-Therapeutic exercises, 97530- Therapeutic activity, [id number]- Neuromuscular re-education, 97535- Self Care, [id number]- Manual therapy, [phone]- Gait training, [phone]- Canalith repositioning, [id number]- Aquatic Therapy, [phone]- Electrical stimulation (unattended), 20560 (1-2 muscles), 20561 (3+ muscles)- Dry Needling, Patient/Family education, Balance training, Stair training, Taping, Joint mobilization, Spinal mobilization, Vestibular training, Cryotherapy, and Moist heat  PLAN FOR NEXT SESSION: Recheck for BPPV as needed; watch for orthostatic sx with bending; continue work on reactive balance, and dynamic visual acuity. Progress balance, gaze stabilization HEP.    Greig Anon, PT 12/05/23 1:23 PM Phone: [phone] Fax: [phone]  [facility] at [facility] [address] [place], [address] [place], [place] [address] Phone # [phone] # [phone]

## 2023-12-09 ENCOUNTER — Ambulatory Visit: Admitting: Physical Therapy

## 2023-12-09 ENCOUNTER — Encounter: Payer: Self-pay | Admitting: Physical Therapy

## 2023-12-09 DIAGNOSIS — R2681 Unsteadiness on feet: Secondary | ICD-10-CM

## 2023-12-09 DIAGNOSIS — M6281 Muscle weakness (generalized): Secondary | ICD-10-CM | POA: Diagnosis not present

## 2023-12-09 DIAGNOSIS — R2689 Other abnormalities of gait and mobility: Secondary | ICD-10-CM

## 2023-12-09 NOTE — Therapy (Signed)
 OUTPATIENT PHYSICAL THERAPY LOWER EXTREMITY TREATMENT NOTE   Patient Name: Brandon Flores MRN: [id number] DOB:09-25-1945, 78 y.o., male Today's Date: 12/09/2023      END OF SESSION:  PT End of Session - 12/09/23 0851     Visit Number 11    Number of Visits 16    Date for PT Re-Evaluation 12/25/23    Authorization Type Medicare    Progress Note Due on Visit 10    PT Start Time 0850    PT Stop Time 0929    PT Time Calculation (min) 39 min    Equipment Utilized During Treatment --   close supervision   Activity Tolerance Patient tolerated treatment well    Behavior During Therapy [facility] for tasks assessed/performed                 Past Medical History:  Diagnosis Date   Cervical radiculopathy    Colon polyp    (2006- adenoma and tubulovillous adenoma; 2010-adenomas; repeat 201)-Dr. Vicci   Complex partial seizure disorder (HCC) 10/08/2014   COVID-19 2021   GERD (gastroesophageal reflux disease)    Gout    Hypercholesterolemia    Lateral epicondylitis    2002   Migraine    headaches   Ophthalmalgia    Dr. Roz   Orthodontics    Dr. Hughie   Partial epilepsy with impairment of consciousness (HCC)    Plantar fasciitis    Prostate cancer (HCC)    Seizure disorder (HCC)    Dr. Cleotilde ([facility])   Seizures [facility])    Traumatic incomplete tear of left rotator cuff 2024   UTI (urinary tract infection)    Vertebrobasilar insufficiency    Vertigo    Past Surgical History:  Procedure Laterality Date   BACK SURGERY     HEMORROIDECTOMY     NECK SURGERY     PROSTATE BIOPSY     Patient Active Problem List   Diagnosis Date Noted   Debility 09/23/2023   Sepsis (HCC) 09/19/2023   Acute cystitis with hematuria 09/19/2023   TIA (transient ischemic attack) 09/19/2023   Encephalomalacia 12/06/2021   Malignant neoplasm of prostate (HCC) 02/03/2020   Obesity (BMI 30-39.9) 08/31/2016   Chronic neck pain 08/31/2016   Cervical radiculopathy 05/03/2016   HLD  (hyperlipidemia) 05/03/2016   GERD (gastroesophageal reflux disease) 04/25/2016   Hyperuricemia 04/25/2016   Low back pain without sciatica 04/25/2016   Degenerative arthritis of left knee 04/25/2016   Metatarsalgia of both feet 04/25/2016   Gout 04/25/2016   Migraine headache 04/25/2016   Nuclear sclerosis of both eyes 04/25/2016   History of colonic polyps 04/25/2016   VBI (vertebrobasilar insufficiency) 04/25/2016   Vertigo 03/10/2014   Generalized convulsive epilepsy (HCC) 11/12/2013   Choroidal nevus, right 10/12/2013   Vitreous floaters 10/12/2013    PCP: Hughie Sharper, MD  REFERRING PROVIDER: Hughie Sharper, MD  REFERRING DIAG: R53.81 (ICD-10-CM) - Debility  THERAPY DIAG:  Unsteadiness on feet  Other abnormalities of gait and mobility  Rationale for Evaluation and Treatment: Rehabilitation  ONSET DATE: 09/28/23  SUBJECTIVE:   SUBJECTIVE STATEMENT: Feeling good and no dizziness after last visit.     PERTINENT HISTORY: history of left temporal hemorrhage with encephalomalacia, petit mall disorder, BPPV, vertebrobasilar insufficiency, prostate cancer, gait disorder who was admitted to hospital on 09/19/2023 with dizziness and fall with inability to get up.   Admitted to CIR 09/23/23 and discharged home 09/28/23  PAIN:  Are you having pain? Yes: NPRS scale: no  Pain location: R knee Pain description: sharp, maybe like it might give way Aggravating factors: steps, walking long distances Relieving factors: slowed pace   PRECAUTIONS: Fall  RED FLAGS: None   WEIGHT BEARING RESTRICTIONS: No  FALLS:  Has patient fallen in last 6 months? No  LIVING ENVIRONMENT: Lives with: lives with their spouse Lives in: House/apartment Stairs: 3 steps in the front, side door is flat entry, 14-16 stairs inside house (L side rail) goes down one step at a time Has following equipment at home: Single point cane, Quad cane small base, Quad cane large base, and Walker - 2  wheeled  OCCUPATION: Retired; watching TV most days and reading, tries to go out once a day and walk in the park  PLOF: Independent  PATIENT GOALS: Improve walking and balance  NEXT MD VISIT: n/a  OBJECTIVE:    TODAY'S TREATMENT: 12/09/2023 Activity Comments  Forward/back stepping with up/down head motions At counter, no dizziness  Standing on Airex: forward/back stepping with UE support at counter Reports increased knee stress  Standing on Airex: Mini squats x 10 Minisquats x 10 then up on toes Partial heel/toe EO head turns/nods, EC head steady 30 sec Intermittent/light UE support  On solid ground partial tandem stance EO head turns/nods, EC head steady 30 sec Light UE support  Tandem gait>tandem march Wean away from UE support  Four square step activity With foam and hurdle obstacle-increases unsteadiness with added obstacles         PATIENT EDUCATION: Education details:Updates to HEP-see below Person educated: Patient Education method: Explanation Education comprehension: verbalized understanding     HOME EXERCISE PROGRAM: Access Code: [email or web address] URL: [email or web address] Date: 12/09/2023 Prepared by: [facility] - Outpatient  Rehab - [facility]  Exercises - Resisted Sit-to-Stand With Dumbbell at Chest  - 1 x daily - 7 x weekly - 3 sets - 10 reps - Corner Balance Feet Together With Eyes Closed  - 1 x daily - 7 x weekly - 3 sets - 10 reps - Standing Bent Over Single Arm Scapular Row with Table Support  - 1 x daily - 7 x weekly - 3 sets - 10 reps - Corner Balance Feet Together: Eyes Closed With Head Turns  - 1 x daily - 7 x weekly - 1 sets - 5 reps - Standing Romberg to 1/2 Tandem Stance  - 1 x daily - 7 x weekly - 1 sets - 3 reps - 30 sec hold - Tandem Walking with Counter Support  - 1 x daily - 7 x weekly - 1 sets - 3 reps      Note: Objective measures were completed at Evaluation unless otherwise noted.  DIAGNOSTIC FINDINGS: 09/19/23  Brain MRI IMPRESSION: 1. No evidence of acute intracranial abnormality. 2. Sequela of remote hemorrhage in the anterior left temporal lobe  CTA head heck was negative for LVO and showed chronic R-VA occlusion or poor flow, moderate stenosis proximal BA, high grade stenosis R-ICA 65-70%, severe stenosis L-ACA origin, 50% stenosis L-CCA and L-ICA bulb and up to moderate stenosis distal left PCA, R-ACA A2 and L-MCA M1 segment. CT abdomen pelvis done revealing bladder wall thickening with pericystic edema suspicious for cystitis and hyperattenuation in dependent bladder favored to be early contrast secretion rather than new bladder stone. 2D echo showed EF 60-65% with mild LVH.   PATIENT SURVEYS:  PSFS: THE PATIENT SPECIFIC FUNCTIONAL SCALE  Place score of 0-10 (0 = unable to perform activity and 10 = able to perform activity  at the same level as before injury or problem)  Activity Date: 10/30/23    Walking 7 (catches himself more to get his balance, shaky)    2.  Standing balance 5     3.     4.      Total Score 6      Total Score = Sum of activity scores/number of activities  Minimally Detectable Change: 3 points (for single activity); 2 points (for average score)  [facility] (nd). The Patient Specific Functional Scale . Retrieved from [email or web address]   COGNITION: Overall cognitive status: Within functional limits for tasks assessed     SENSATION: Neuropathy in bilat feet but can feel more in his arch  EDEMA:  None  MUSCLE LENGTH: Did not assess  PALPATION: Did not assess  LOWER EXTREMITY ROM: WNL  LOWER EXTREMITY MMT:  MMT Right eval Left eval  Hip flexion 4+ 4  Hip extension    Hip abduction 5 5  Hip adduction    Hip internal rotation    Hip external rotation    Knee flexion 4+ 4+  Knee extension 4+ 4+  Ankle dorsiflexion    Ankle plantarflexion    Ankle inversion    Ankle eversion      (Blank rows = not tested)  LOWER EXTREMITY SPECIAL TESTS:  Did not assess  FUNCTIONAL TESTS:  5 times sit to stand: 8.14 sec; LEs against bed for stability, hands on knees FGA: 21/30   M-CTSIB  Condition 1: Firm Surface, EO 30 Sec, Normal Sway  Condition 2: Firm Surface, EC 30 Sec, Mild Sway  Condition 3: Foam Surface, EO 30 Sec, Mild Sway  Condition 4: Foam Surface, EC 20 Sec, Moderate and Severe Sway      GAIT: Distance walked: Into clinic; at least 200' Assistive device utilized: None Level of assistance: Complete Independence Comments: Amb 10 meters in 10.26 sec = 0.97 m/s. Widened BOS, R foot externally rotated, slap foot R>L  VESTIBULAR ASSESSMENT:  GENERAL OBSERVATION: n/a   SYMPTOM BEHAVIOR:  Subjective history: Reports no attacks of imbalance and unsteadiness since leaving the hospital but general unsteadiness. Has had PT in the past for dizziness. Went to one clinic and got what sounds like canalith repositioning and then went to another clinic who performed primarily balance work.   Non-Vestibular symptoms: None  Type of dizziness: Imbalance (Disequilibrium), Unsteady with head/body turns, and Lightheadedness/Faint  Frequency: No attacks since leaving the hospital but still feels unsteady  Duration: none  Aggravating factors: No known aggravating factors  Relieving factors: no known relieving factors  Progression of symptoms: better  OCULOMOTOR EXAM:  Ocular Alignment: normal  Ocular ROM: No Limitations  Spontaneous Nystagmus: absent  Gaze-Induced Nystagmus: absent  Smooth Pursuits: intact  Saccades: intact   R eye has one black dot and L eye has multiple black dots and floaters but pt reports this is normal  FRENZEL - FIXATION SUPRESSED: Did not assesss  VESTIBULAR - OCULAR REFLEX:   Slow VOR: Normal  VOR Cancellation: Normal  Head-Impulse Test: HIT Right: negative HIT Left: positive  Dynamic Visual Acuity: TBA   POSITIONAL TESTING:  Sidelying  Dix-hallpike R: (-) -- some dizziness/unsteadiness with return to sitting Sidelying Dix-hallpike L: (-) -- some dizziness/unsteadiness with return to sitting   MOTION SENSITIVITY: not performed   OTHOSTATICS: not done  ASSESSMENT:  CLINICAL IMPRESSION: Pt presents today with no new complaints of dizziness. Skilled PT session focused on balance work on compliant, solid surfaces, and dynamic balance work.  He is able to progress from light UE support to no support with tandem gait, with cues for abdominal activation and use of visual targets.  Upgraded HEP to include partial tandem stance and tandem gait activities.   Pt will continue to benefit from skilled PT towards goals for improved functional mobility and decreased fall risk.  OBJECTIVE IMPAIRMENTS: Abnormal gait, decreased balance, decreased coordination, decreased endurance, decreased mobility, difficulty walking, decreased ROM, decreased strength, dizziness, impaired UE functional use, improper body mechanics, postural dysfunction, and pain.   ACTIVITY LIMITATIONS: lifting, bending, standing, squatting, stairs, transfers, bed mobility, locomotion level, and caring for others  PARTICIPATION LIMITATIONS: shopping, community activity, and yard work  PERSONAL FACTORS: Age, Fitness, Past/current experiences, and Time since onset of injury/illness/exacerbation are also affecting patient's functional outcome.   REHAB POTENTIAL: Good  CLINICAL DECISION MAKING: Evolving/moderate complexity  EVALUATION COMPLEXITY: Moderate   GOALS: Goals reviewed with patient? Yes  SHORT TERM GOALS: Target date: 11/27/2023  Pt will be ind with initial HEP Baseline: pt reports performing HEP 11/25/23 Goal status: MET 11/25/23  2.  Pt will report improved dizziness by >/=25% Baseline: pt reports remaining lightheadedness but denies  dizziness 11/25/23 Goal status: NOT MET 11/25/23   LONG TERM GOALS: Target date: 12/25/2023   Pt will be ind with management and progression of HEP Baseline:  Goal status: IN PROGRESS  2.  Pt will have improved FGA to >/=28/30 for reduced fall risk Baseline: 21>24/30 Goal status: IN PROGRESS  3.  Pt will have improved gait speed to >/=1.1 m/s to be considered high functioning Baseline: 0.97 m/s Goal status: IN PROGRESS  4.  Pt will have improved PSFS average score to >/=8 to demo MCID Baseline: 6 Goal status: IN PROGRESS  5.  Pt will report improved dizzy symptoms by >/=50% Baseline:  Goal status: IN PROGRESS    PLAN:  PT FREQUENCY: 2x/week  PT DURATION: 8 weeks  PLANNED INTERVENTIONS: 97164- PT Re-evaluation, 97750- Physical Performance Testing, 97110-Therapeutic exercises, 97530- Therapeutic activity, [id number]- Neuromuscular re-education, 97535- Self Care, [id number]- Manual therapy, [id number]- Gait training, [id number]- Canalith repositioning, [id number]- Aquatic Therapy, [phone]- Electrical stimulation (unattended), 20560 (1-2 muscles), 20561 (3+ muscles)- Dry Needling, Patient/Family education, Balance training, Stair training, Taping, Joint mobilization, Spinal mobilization, Vestibular training, Cryotherapy, and Moist heat  PLAN FOR NEXT SESSION: Check updates to HEP.  Recheck for BPPV as needed; watch for orthostatic sx with bending; continue work on reactive balance, and dynamic visual acuity. Progress balance, gaze stabilization HEP.    Greig Anon, PT 12/09/23 9:32 AM Phone: [phone] Fax: [phone]  [facility] at [facility] [address] [place], [address] [place], [place] [address] Phone # [phone] Fax # [phone]

## 2023-12-11 ENCOUNTER — Encounter: Payer: Self-pay | Admitting: Physical Therapy

## 2023-12-11 ENCOUNTER — Ambulatory Visit: Admitting: Physical Therapy

## 2023-12-11 DIAGNOSIS — R2681 Unsteadiness on feet: Secondary | ICD-10-CM

## 2023-12-11 DIAGNOSIS — M6281 Muscle weakness (generalized): Secondary | ICD-10-CM | POA: Diagnosis not present

## 2023-12-11 DIAGNOSIS — R2689 Other abnormalities of gait and mobility: Secondary | ICD-10-CM | POA: Diagnosis not present

## 2023-12-11 NOTE — Therapy (Signed)
 OUTPATIENT PHYSICAL THERAPY LOWER EXTREMITY TREATMENT NOTE   Patient Name: Brandon Flores MRN: [id number] DOB:01/20/1946, 78 y.o., male Today's Date: 12/11/2023      END OF SESSION:  PT End of Session - 12/11/23 0851     Visit Number 12    Number of Visits 16    Date for PT Re-Evaluation 12/25/23    Authorization Type Medicare    Progress Note Due on Visit 10    PT Start Time 0849    PT Stop Time 0930    PT Time Calculation (min) 41 min    Equipment Utilized During Treatment Gait belt    Activity Tolerance Patient tolerated treatment well    Behavior During Therapy WFL for tasks assessed/performed                  Past Medical History:  Diagnosis Date   Cervical radiculopathy    Colon polyp    (2006- adenoma and tubulovillous adenoma; 2010-adenomas; repeat 201)-Dr. Vicci   Complex partial seizure disorder (HCC) 10/08/2014   COVID-19 2021   GERD (gastroesophageal reflux disease)    Gout    Hypercholesterolemia    Lateral epicondylitis    2002   Migraine    headaches   Ophthalmalgia    Dr. Roz   Orthodontics    Dr. Hughie   Partial epilepsy with impairment of consciousness (HCC)    Plantar fasciitis    Prostate cancer (HCC)    Seizure disorder (HCC)    Dr. Cleotilde ([facility])   Seizures [facility])    Traumatic incomplete tear of left rotator cuff 2024   UTI (urinary tract infection)    Vertebrobasilar insufficiency    Vertigo    Past Surgical History:  Procedure Laterality Date   BACK SURGERY     HEMORROIDECTOMY     NECK SURGERY     PROSTATE BIOPSY     Patient Active Problem List   Diagnosis Date Noted   Debility 09/23/2023   Sepsis (HCC) 09/19/2023   Acute cystitis with hematuria 09/19/2023   TIA (transient ischemic attack) 09/19/2023   Encephalomalacia 12/06/2021   Malignant neoplasm of prostate (HCC) 02/03/2020   Obesity (BMI 30-39.9) 08/31/2016   Chronic neck pain 08/31/2016   Cervical radiculopathy 05/03/2016   HLD (hyperlipidemia)  05/03/2016   GERD (gastroesophageal reflux disease) 04/25/2016   Hyperuricemia 04/25/2016   Low back pain without sciatica 04/25/2016   Degenerative arthritis of left knee 04/25/2016   Metatarsalgia of both feet 04/25/2016   Gout 04/25/2016   Migraine headache 04/25/2016   Nuclear sclerosis of both eyes 04/25/2016   History of colonic polyps 04/25/2016   VBI (vertebrobasilar insufficiency) 04/25/2016   Vertigo 03/10/2014   Generalized convulsive epilepsy (HCC) 11/12/2013   Choroidal nevus, right 10/12/2013   Vitreous floaters 10/12/2013    PCP: Hughie Sharper, MD  REFERRING PROVIDER: Hughie Sharper, MD  REFERRING DIAG: R53.81 (ICD-10-CM) - Debility  THERAPY DIAG:  Unsteadiness on feet  Other abnormalities of gait and mobility  Rationale for Evaluation and Treatment: Rehabilitation  ONSET DATE: 09/28/23  SUBJECTIVE:   SUBJECTIVE STATEMENT: No dizziness, being more careful when I get up in the middle of the night.     PERTINENT HISTORY: history of left temporal hemorrhage with encephalomalacia, petit mall disorder, BPPV, vertebrobasilar insufficiency, prostate cancer, gait disorder who was admitted to hospital on 09/19/2023 with dizziness and fall with inability to get up.   Admitted to CIR 09/23/23 and discharged home 09/28/23  PAIN:  Are you  having pain? Yes: NPRS scale: no Pain location: R knee Pain description: sharp, maybe like it might give way Aggravating factors: steps, walking long distances Relieving factors: slowed pace   PRECAUTIONS: Fall  RED FLAGS: None   WEIGHT BEARING RESTRICTIONS: No  FALLS:  Has patient fallen in last 6 months? No  LIVING ENVIRONMENT: Lives with: lives with their spouse Lives in: House/apartment Stairs: 3 steps in the front, side door is flat entry, 14-16 stairs inside house (L side rail) goes down one step at a time Has following equipment at home: Single point cane, Quad cane small base, Quad cane large base, and  Walker - 2 wheeled  OCCUPATION: Retired; watching TV most days and reading, tries to go out once a day and walk in the park  PLOF: Independent  PATIENT GOALS: Improve walking and balance  NEXT MD VISIT: n/a  OBJECTIVE:    TODAY'S TREATMENT: 12/11/2023 Activity Comments  Dynamic warm-up: -Forward/backwards walk -Sidestep -Gait with head motions-horizontal, vertical, diagonal  2 minutes each -narrow BOS upon quick stop Some pain in L hip  Short distance gait with carry task, simulating squat/lift/carry   Short distance gait with 7# carry 25 ft x 6 reps-cues for abdominal activation  Review of partial tandem stance and tandem gait added to HEP last visit Good return demo  Stagger stance forward/back rocking then stagger stance SLS Cues for sequence          PATIENT EDUCATION: Education details:Review of updates to HEP, continued use of visual targets for help with balance Person educated: Patient Education method: Explanation Education comprehension: verbalized understanding     HOME EXERCISE PROGRAM: Access Code: [email or web address] URL: [email or web address] Date: 12/09/2023 Prepared by: [facility] - Outpatient  Rehab - [facility]  Exercises - Resisted Sit-to-Stand With Dumbbell at Chest  - 1 x daily - 7 x weekly - 3 sets - 10 reps - Corner Balance Feet Together With Eyes Closed  - 1 x daily - 7 x weekly - 3 sets - 10 reps - Standing Bent Over Single Arm Scapular Row with Table Support  - 1 x daily - 7 x weekly - 3 sets - 10 reps - Corner Balance Feet Together: Eyes Closed With Head Turns  - 1 x daily - 7 x weekly - 1 sets - 5 reps - Standing Romberg to 1/2 Tandem Stance  - 1 x daily - 7 x weekly - 1 sets - 3 reps - 30 sec hold - Tandem Walking with Counter Support  - 1 x daily - 7 x weekly - 1 sets - 3 reps      Note: Objective measures were completed at Evaluation unless otherwise noted.  DIAGNOSTIC FINDINGS: 09/19/23 Brain MRI IMPRESSION: 1. No  evidence of acute intracranial abnormality. 2. Sequela of remote hemorrhage in the anterior left temporal lobe  CTA head heck was negative for LVO and showed chronic R-VA occlusion or poor flow, moderate stenosis proximal BA, high grade stenosis R-ICA 65-70%, severe stenosis L-ACA origin, 50% stenosis L-CCA and L-ICA bulb and up to moderate stenosis distal left PCA, R-ACA A2 and L-MCA M1 segment. CT abdomen pelvis done revealing bladder wall thickening with pericystic edema suspicious for cystitis and hyperattenuation in dependent bladder favored to be early contrast secretion rather than new bladder stone. 2D echo showed EF 60-65% with mild LVH.   PATIENT SURVEYS:  PSFS: THE PATIENT SPECIFIC FUNCTIONAL SCALE  Place score of 0-10 (0 = unable to perform activity and 10 = able to  perform activity at the same level as before injury or problem)  Activity Date: 10/30/23    Walking 7 (catches himself more to get his balance, shaky)    2.  Standing balance 5     3.     4.      Total Score 6      Total Score = Sum of activity scores/number of activities  Minimally Detectable Change: 3 points (for single activity); 2 points (for average score)  [facility] (nd). The Patient Specific Functional Scale . Retrieved from [email or web address]   COGNITION: Overall cognitive status: Within functional limits for tasks assessed     SENSATION: Neuropathy in bilat feet but can feel more in his arch  EDEMA:  None  MUSCLE LENGTH: Did not assess  PALPATION: Did not assess  LOWER EXTREMITY ROM: WNL  LOWER EXTREMITY MMT:  MMT Right eval Left eval  Hip flexion 4+ 4  Hip extension    Hip abduction 5 5  Hip adduction    Hip internal rotation    Hip external rotation    Knee flexion 4+ 4+  Knee extension 4+ 4+  Ankle dorsiflexion    Ankle plantarflexion    Ankle inversion    Ankle eversion     (Blank rows = not  tested)  LOWER EXTREMITY SPECIAL TESTS:  Did not assess  FUNCTIONAL TESTS:  5 times sit to stand: 8.14 sec; LEs against bed for stability, hands on knees FGA: 21/30   M-CTSIB  Condition 1: Firm Surface, EO 30 Sec, Normal Sway  Condition 2: Firm Surface, EC 30 Sec, Mild Sway  Condition 3: Foam Surface, EO 30 Sec, Mild Sway  Condition 4: Foam Surface, EC 20 Sec, Moderate and Severe Sway      GAIT: Distance walked: Into clinic; at least 200' Assistive device utilized: None Level of assistance: Complete Independence Comments: Amb 10 meters in 10.26 sec = 0.97 m/s. Widened BOS, R foot externally rotated, slap foot R>L  VESTIBULAR ASSESSMENT:  GENERAL OBSERVATION: n/a   SYMPTOM BEHAVIOR:  Subjective history: Reports no attacks of imbalance and unsteadiness since leaving the hospital but general unsteadiness. Has had PT in the past for dizziness. Went to one clinic and got what sounds like canalith repositioning and then went to another clinic who performed primarily balance work.   Non-Vestibular symptoms: None  Type of dizziness: Imbalance (Disequilibrium), Unsteady with head/body turns, and Lightheadedness/Faint  Frequency: No attacks since leaving the hospital but still feels unsteady  Duration: none  Aggravating factors: No known aggravating factors  Relieving factors: no known relieving factors  Progression of symptoms: better  OCULOMOTOR EXAM:  Ocular Alignment: normal  Ocular ROM: No Limitations  Spontaneous Nystagmus: absent  Gaze-Induced Nystagmus: absent  Smooth Pursuits: intact  Saccades: intact   R eye has one black dot and L eye has multiple black dots and floaters but pt reports this is normal  FRENZEL - FIXATION SUPRESSED: Did not assesss  VESTIBULAR - OCULAR REFLEX:   Slow VOR: Normal  VOR Cancellation: Normal  Head-Impulse Test: HIT Right: negative HIT Left: positive  Dynamic Visual Acuity: TBA   POSITIONAL TESTING:  Sidelying [place]-hallpike R:  (-) -- some dizziness/unsteadiness with return to sitting Sidelying [place]-hallpike L: (-) -- some dizziness/unsteadiness with return to sitting   MOTION SENSITIVITY: not performed   OTHOSTATICS: not done  ASSESSMENT:  CLINICAL IMPRESSION: Pt presents today, continuing to report no additional bouts of dizziness.  Skilled PT session focused on dynamic gait and balance, with head motions, varied directions and carry tasks, simulating items in the community for upcoming antique sale.  He is improving with tandem gait and tandem stance; he continues to have increased sway in standing balance positions with EC, tends to rely more on ankle strategy, with less frequent use of weightshifting through hips.  He will continue to benefit from skilled PT towards goals for improved functional mobility and decreased fall risk.  OBJECTIVE IMPAIRMENTS: Abnormal gait, decreased balance, decreased coordination, decreased endurance, decreased mobility, difficulty walking, decreased ROM, decreased strength, dizziness, impaired UE functional use, improper body mechanics, postural dysfunction, and pain.   ACTIVITY LIMITATIONS: lifting, bending, standing, squatting, stairs, transfers, bed mobility, locomotion level, and caring for others  PARTICIPATION LIMITATIONS: shopping, community activity, and yard work  PERSONAL FACTORS: Age, Fitness, Past/current experiences, and Time since onset of injury/illness/exacerbation are also affecting patient's functional outcome.   REHAB POTENTIAL: Good  CLINICAL DECISION MAKING: Evolving/moderate complexity  EVALUATION COMPLEXITY: Moderate   GOALS: Goals reviewed with patient? Yes  SHORT TERM GOALS: Target date: 11/27/2023  Pt will be ind with initial HEP Baseline: pt reports performing HEP 11/25/23 Goal status: MET 11/25/23  2.  Pt will report  improved dizziness by >/=25% Baseline: pt reports remaining lightheadedness but denies dizziness 11/25/23 Goal status: NOT MET 11/25/23   LONG TERM GOALS: Target date: 12/25/2023   Pt will be ind with management and progression of HEP Baseline:  Goal status: IN PROGRESS  2.  Pt will have improved FGA to >/=28/30 for reduced fall risk Baseline: 21>24/30 Goal status: IN PROGRESS  3.  Pt will have improved gait speed to >/=1.1 m/s to be considered high functioning Baseline: 0.97 m/s Goal status: IN PROGRESS  4.  Pt will have improved PSFS average score to >/=8 to demo MCID Baseline: 6 Goal status: IN PROGRESS  5.  Pt will report improved dizzy symptoms by >/=50% Baseline:  Goal status: IN PROGRESS    PLAN:  PT FREQUENCY: 2x/week  PT DURATION: 8 weeks  PLANNED INTERVENTIONS: [id number]- PT Re-evaluation, 97750- Physical Performance Testing, 97110-Therapeutic exercises, 97530- Therapeutic activity, [id number]- Neuromuscular re-education, 97535- Self Care, [id number]- Manual therapy, [phone]- Gait training, [phone]- Canalith repositioning, [id number]- Aquatic Therapy, [phone]- Electrical stimulation (unattended), 20560 (1-2 muscles), [id number] (3+ muscles)- Dry Needling, Patient/Family education, Balance training, Stair training, Taping, Joint mobilization, Spinal mobilization, Vestibular training, Cryotherapy, and Moist heat  PLAN FOR NEXT SESSION:   Recheck for BPPV as needed; watch for orthostatic sx with bending; continue work on reactive balance, and dynamic visual acuity. Progress balance, gaze stabilization HEP, working towards LTGs.    Greig Anon, PT 12/11/23 2:25 PM Phone: [phone] Fax: [phone]  [facility] at [facility] [address] [place], [address] [place], [place] [address] Phone # [phone] Fax # [phone]

## 2023-12-13 NOTE — Therapy (Signed)
 OUTPATIENT PHYSICAL THERAPY LOWER EXTREMITY TREATMENT NOTE   Patient Name: Brandon Flores MRN: [id number] DOB:11-18-1945, 78 y.o., male Today's Date: 12/16/2023      END OF SESSION:  PT End of Session - 12/16/23 0930     Visit Number 13    Number of Visits 16    Date for PT Re-Evaluation 12/25/23    Authorization Type Medicare    Progress Note Due on Visit 10    PT Start Time 0849    PT Stop Time 0930    PT Time Calculation (min) 41 min    Equipment Utilized During Treatment Gait belt    Activity Tolerance Patient tolerated treatment well    Behavior During Therapy WFL for tasks assessed/performed                   Past Medical History:  Diagnosis Date   Cervical radiculopathy    Colon polyp    (2006- adenoma and tubulovillous adenoma; 2010-adenomas; repeat 201)-Dr. Vicci   Complex partial seizure disorder (HCC) 10/08/2014   COVID-19 2021   GERD (gastroesophageal reflux disease)    Gout    Hypercholesterolemia    Lateral epicondylitis    2002   Migraine    headaches   Ophthalmalgia    Dr. Roz   Orthodontics    Dr. Hughie   Partial epilepsy with impairment of consciousness (HCC)    Plantar fasciitis    Prostate cancer (HCC)    Seizure disorder (HCC)    Dr. Cleotilde ([facility])   Seizures [facility])    Traumatic incomplete tear of left rotator cuff 2024   UTI (urinary tract infection)    Vertebrobasilar insufficiency    Vertigo    Past Surgical History:  Procedure Laterality Date   BACK SURGERY     HEMORROIDECTOMY     NECK SURGERY     PROSTATE BIOPSY     Patient Active Problem List   Diagnosis Date Noted   Debility 09/23/2023   Sepsis (HCC) 09/19/2023   Acute cystitis with hematuria 09/19/2023   TIA (transient ischemic attack) 09/19/2023   Encephalomalacia 12/06/2021   Malignant neoplasm of prostate (HCC) 02/03/2020   Obesity (BMI 30-39.9) 08/31/2016   Chronic neck pain 08/31/2016   Cervical radiculopathy 05/03/2016   HLD (hyperlipidemia)  05/03/2016   GERD (gastroesophageal reflux disease) 04/25/2016   Hyperuricemia 04/25/2016   Low back pain without sciatica 04/25/2016   Degenerative arthritis of left knee 04/25/2016   Metatarsalgia of both feet 04/25/2016   Gout 04/25/2016   Migraine headache 04/25/2016   Nuclear sclerosis of both eyes 04/25/2016   History of colonic polyps 04/25/2016   VBI (vertebrobasilar insufficiency) 04/25/2016   Vertigo 03/10/2014   Generalized convulsive epilepsy (HCC) 11/12/2013   Choroidal nevus, right 10/12/2013   Vitreous floaters 10/12/2013    PCP: Hughie Sharper, MD  REFERRING PROVIDER: Hughie Sharper, MD  REFERRING DIAG: R53.81 (ICD-10-CM) - Debility  THERAPY DIAG:  Unsteadiness on feet  Other abnormalities of gait and mobility  Muscle weakness (generalized)  Rationale for Evaluation and Treatment: Rehabilitation  ONSET DATE: 09/28/23  SUBJECTIVE:   SUBJECTIVE STATEMENT: Got a bad case of vertigo Saturday while getting out of bed. Stayed in bed all day Saturday and this lasted until Sunday morning. Reports that he checked his BP and it was 140/80. Denies dizziness when getting out of bed this AM. Pt reports that he feels better with his balance and dizziness since starting therapy.      PERTINENT HISTORY:  history of left temporal hemorrhage with encephalomalacia, petit mall disorder, BPPV, vertebrobasilar insufficiency, prostate cancer, gait disorder who was admitted to hospital on 09/19/2023 with dizziness and fall with inability to get up.   Admitted to CIR 09/23/23 and discharged home 09/28/23  PAIN:  Are you having pain? Yes: NPRS scale: no Pain location: R knee Pain description: sharp, maybe like it might give way Aggravating factors: steps, walking long distances Relieving factors: slowed pace   PRECAUTIONS: Fall  RED FLAGS: None   WEIGHT BEARING RESTRICTIONS: No  FALLS:  Has patient fallen in last 6 months? No  LIVING ENVIRONMENT: Lives with: lives  with their spouse Lives in: House/apartment Stairs: 3 steps in the front, side door is flat entry, 14-16 stairs inside house (L side rail) goes down one step at a time Has following equipment at home: Single point cane, Quad cane small base, Quad cane large base, and Walker - 2 wheeled  OCCUPATION: Retired; watching TV most days and reading, tries to go out once a day and walk in the park  PLOF: Independent  PATIENT GOALS: Improve walking and balance  NEXT MD VISIT: n/a  OBJECTIVE:     TODAY'S TREATMENT: 12/16/23 Activity Comments  R roll test R upbeating torsional nystagmus lasting ~15 sec; no dizziness   L roll test  Very small amplitude L upbeating torsional nystagmus lasting 10 sec; no dizziness    L loaded DH Negative  R loaded DH R upbeating torsional nystagmus lasting ~ 1 min pt denies dizziness   R epley  Tolerated well   fwd/back stepping, then +head nods Able to wean UE support Patient reports feeling better since R Epley   step up + opposite SKTC Weaned to 1 UE support on L LE; required B UEs and shorter step on R LE d/t c/o knee pain              PATIENT EDUCATION: Education details: verbal review of HEP- pt reports compliance with HEP and reports most imbalance with EC tasks  Person educated: Patient Education method: Explanation Education comprehension: verbalized understanding    HOME EXERCISE PROGRAM: Access Code: [email or web address] URL: [email or web address] Date: 12/09/2023 Prepared by: [facility] - Outpatient  Rehab - [facility]  Exercises - Resisted Sit-to-Stand With Dumbbell at Chest  - 1 x daily - 7 x weekly - 3 sets - 10 reps - Corner Balance Feet Together With Eyes Closed  - 1 x daily - 7 x weekly - 3 sets - 10 reps - Standing Bent Over Single Arm Scapular Row with Table Support  - 1 x daily - 7 x weekly - 3 sets - 10 reps - Corner Balance Feet Together: Eyes Closed With Head Turns  - 1 x daily - 7 x weekly - 1 sets - 5 reps -  Standing Romberg to 1/2 Tandem Stance  - 1 x daily - 7 x weekly - 1 sets - 3 reps - 30 sec hold - Tandem Walking with Counter Support  - 1 x daily - 7 x weekly - 1 sets - 3 reps      Note: Objective measures were completed at Evaluation unless otherwise noted.  DIAGNOSTIC FINDINGS: 09/19/23 Brain MRI IMPRESSION: 1. No evidence of acute intracranial abnormality. 2. Sequela of remote hemorrhage in the anterior left temporal lobe  CTA head heck was negative for LVO and showed chronic R-VA occlusion or poor flow, moderate stenosis proximal BA, high grade stenosis R-ICA 65-70%, severe stenosis L-ACA origin, 50% stenosis  L-CCA and L-ICA bulb and up to moderate stenosis distal left PCA, R-ACA A2 and L-MCA M1 segment. CT abdomen pelvis done revealing bladder wall thickening with pericystic edema suspicious for cystitis and hyperattenuation in dependent bladder favored to be early contrast secretion rather than new bladder stone. 2D echo showed EF 60-65% with mild LVH.   PATIENT SURVEYS:  PSFS: THE PATIENT SPECIFIC FUNCTIONAL SCALE  Place score of 0-10 (0 = unable to perform activity and 10 = able to perform activity at the same level as before injury or problem)  Activity Date: 10/30/23    Walking 7 (catches himself more to get his balance, shaky)    2.  Standing balance 5     3.     4.      Total Score 6      Total Score = Sum of activity scores/number of activities  Minimally Detectable Change: 3 points (for single activity); 2 points (for average score)  [facility] (nd). The Patient Specific Functional Scale . Retrieved from [email or web address]   COGNITION: Overall cognitive status: Within functional limits for tasks assessed     SENSATION: Neuropathy in bilat feet but can feel more in his arch  EDEMA:  None  MUSCLE LENGTH: Did not assess  PALPATION: Did not assess  LOWER EXTREMITY ROM: WNL  LOWER EXTREMITY  MMT:  MMT Right eval Left eval  Hip flexion 4+ 4  Hip extension    Hip abduction 5 5  Hip adduction    Hip internal rotation    Hip external rotation    Knee flexion 4+ 4+  Knee extension 4+ 4+  Ankle dorsiflexion    Ankle plantarflexion    Ankle inversion    Ankle eversion     (Blank rows = not tested)  LOWER EXTREMITY SPECIAL TESTS:  Did not assess  FUNCTIONAL TESTS:  5 times sit to stand: 8.14 sec; LEs against bed for stability, hands on knees FGA: 21/30   M-CTSIB  Condition 1: Firm Surface, EO 30 Sec, Normal Sway  Condition 2: Firm Surface, EC 30 Sec, Mild Sway  Condition 3: Foam Surface, EO 30 Sec, Mild Sway  Condition 4: Foam Surface, EC 20 Sec, Moderate and Severe Sway      GAIT: Distance walked: Into clinic; at least 200' Assistive device utilized: None Level of assistance: Complete Independence Comments: Amb 10 meters in 10.26 sec = 0.97 m/s. Widened BOS, R foot externally rotated, slap foot R>L  VESTIBULAR ASSESSMENT:  GENERAL OBSERVATION: n/a   SYMPTOM BEHAVIOR:  Subjective history: Reports no attacks of imbalance and unsteadiness since leaving the hospital but general unsteadiness. Has had PT in the past for dizziness. Went to one clinic and got what sounds like canalith repositioning and then went to another clinic who performed primarily balance work.   Non-Vestibular symptoms: None  Type of dizziness: Imbalance (Disequilibrium), Unsteady with head/body turns, and Lightheadedness/Faint  Frequency: No attacks since leaving the hospital but still feels unsteady  Duration: none  Aggravating factors: No known aggravating factors  Relieving factors: no known relieving factors  Progression of symptoms: better  OCULOMOTOR EXAM:  Ocular Alignment: normal  Ocular ROM: No Limitations  Spontaneous Nystagmus: absent  Gaze-Induced Nystagmus: absent  Smooth Pursuits: intact  Saccades: intact   R eye has one black dot and L eye has multiple black dots  and floaters but pt reports this is normal  FRENZEL - FIXATION SUPRESSED: Did not assesss  VESTIBULAR - OCULAR REFLEX:   Slow  VOR: Normal  VOR Cancellation: Normal  Head-Impulse Test: HIT Right: negative HIT Left: positive  Dynamic Visual Acuity: TBA   POSITIONAL TESTING:  Sidelying Dix-hallpike R: (-) -- some dizziness/unsteadiness with return to sitting Sidelying Dix-hallpike L: (-) -- some dizziness/unsteadiness with return to sitting   MOTION SENSITIVITY: not performed   OTHOSTATICS: not done                                                                                                                               ASSESSMENT:  CLINICAL IMPRESSION: Patient arrived to session with report of an episode of vertigo lasting all day Saturday when getting out of bed. Positional testing revealed asymptomatic but positive R DH, treated with R Epley. Patient tolerated this well and reported improvement after CRM. Upon discussion, patient endorses hx of migraines which could be contributing to positional nystagmus. Proceeded with balance activities with vestibular challenges with improved stability. No complaints upon leaving.   OBJECTIVE IMPAIRMENTS: Abnormal gait, decreased balance, decreased coordination, decreased endurance, decreased mobility, difficulty walking, decreased ROM, decreased strength, dizziness, impaired UE functional use, improper body mechanics, postural dysfunction, and pain.   ACTIVITY LIMITATIONS: lifting, bending, standing, squatting, stairs, transfers, bed mobility, locomotion level, and caring for others  PARTICIPATION LIMITATIONS: shopping, community activity, and yard work  PERSONAL FACTORS: Age, Fitness, Past/current experiences, and Time since onset of injury/illness/exacerbation are also affecting patient's functional outcome.   REHAB POTENTIAL: Good  CLINICAL DECISION MAKING: Evolving/moderate complexity  EVALUATION COMPLEXITY:  Moderate   GOALS: Goals reviewed with patient? Yes  SHORT TERM GOALS: Target date: 11/27/2023  Pt will be ind with initial HEP Baseline: pt reports performing HEP 11/25/23 Goal status: MET 11/25/23  2.  Pt will report improved dizziness by >/=25% Baseline: pt reports remaining lightheadedness but denies dizziness 11/25/23 Goal status: NOT MET 11/25/23   LONG TERM GOALS: Target date: 12/25/2023   Pt will be ind with management and progression of HEP Baseline:  Goal status: IN PROGRESS  2.  Pt will have improved FGA to >/=28/30 for reduced fall risk Baseline: 21>24/30 Goal status: IN PROGRESS  3.  Pt will have improved gait speed to >/=1.1 m/s to be considered high functioning Baseline: 0.97 m/s Goal status: IN PROGRESS  4.  Pt will have improved PSFS average score to >/=8 to demo MCID Baseline: 6 Goal status: IN PROGRESS  5.  Pt will report improved dizzy symptoms by >/=50% Baseline:  Goal status: IN PROGRESS    PLAN:  PT FREQUENCY: 2x/week  PT DURATION: 8 weeks  PLANNED INTERVENTIONS: [id number]- PT Re-evaluation, 97750- Physical Performance Testing, 97110-Therapeutic exercises, 97530- Therapeutic activity, [id number]- Neuromuscular re-education, 97535- Self Care, [id number]- Manual therapy, [phone]- Gait training, [phone]- Canalith repositioning, [id number]- Aquatic Therapy, [phone]- Electrical stimulation (unattended), [id number] (1-2 muscles), [id number] (3+ muscles)- Dry Needling, Patient/Family education, Balance training, Stair training, Taping, Joint mobilization, Spinal mobilization, Vestibular training, Cryotherapy, and Moist heat  PLAN FOR NEXT SESSION:   Recheck for  BPPV as needed; watch for orthostatic sx with bending; continue work on reactive balance, and dynamic visual acuity. Progress balance, gaze stabilization HEP, working towards LTGs.     Louana Terrilyn Christians, PT, DPT 12/16/23 9:32 AM  [facility] at [facility] [address] [place], [address] [place], [place] [address] Phone # [phone] Fax # [phone]

## 2023-12-16 ENCOUNTER — Ambulatory Visit: Admitting: Physical Therapy

## 2023-12-16 ENCOUNTER — Encounter: Payer: Self-pay | Admitting: Physical Therapy

## 2023-12-16 DIAGNOSIS — M6281 Muscle weakness (generalized): Secondary | ICD-10-CM

## 2023-12-16 DIAGNOSIS — R2689 Other abnormalities of gait and mobility: Secondary | ICD-10-CM | POA: Diagnosis not present

## 2023-12-16 DIAGNOSIS — R2681 Unsteadiness on feet: Secondary | ICD-10-CM

## 2023-12-17 NOTE — Therapy (Signed)
 OUTPATIENT PHYSICAL THERAPY LOWER EXTREMITY TREATMENT NOTE   Patient Name: Brandon Flores MRN: [id number] DOB:1945-08-01, 78 y.o., male Today's Date: 12/18/2023      END OF SESSION:  PT End of Session - 12/18/23 0850     Visit Number 14    Number of Visits 16    Date for PT Re-Evaluation 12/25/23    Authorization Type Medicare    Progress Note Due on Visit 10    PT Start Time 0844    PT Stop Time 0926    PT Time Calculation (min) 42 min    Equipment Utilized During Treatment Gait belt    Activity Tolerance Patient tolerated treatment well    Behavior During Therapy WFL for tasks assessed/performed                    Past Medical History:  Diagnosis Date   Cervical radiculopathy    Colon polyp    (2006- adenoma and tubulovillous adenoma; 2010-adenomas; repeat 201)-Dr. Vicci   Complex partial seizure disorder (HCC) 10/08/2014   COVID-19 2021   GERD (gastroesophageal reflux disease)    Gout    Hypercholesterolemia    Lateral epicondylitis    2002   Migraine    headaches   Ophthalmalgia    Dr. Roz   Orthodontics    Dr. Hughie   Partial epilepsy with impairment of consciousness (HCC)    Plantar fasciitis    Prostate cancer (HCC)    Seizure disorder (HCC)    Dr. Cleotilde ([facility])   Seizures [facility])    Traumatic incomplete tear of left rotator cuff 2024   UTI (urinary tract infection)    Vertebrobasilar insufficiency    Vertigo    Past Surgical History:  Procedure Laterality Date   BACK SURGERY     HEMORROIDECTOMY     NECK SURGERY     PROSTATE BIOPSY     Patient Active Problem List   Diagnosis Date Noted   Debility 09/23/2023   Sepsis (HCC) 09/19/2023   Acute cystitis with hematuria 09/19/2023   TIA (transient ischemic attack) 09/19/2023   Encephalomalacia 12/06/2021   Malignant neoplasm of prostate (HCC) 02/03/2020   Obesity (BMI 30-39.9) 08/31/2016   Chronic neck pain 08/31/2016   Cervical radiculopathy 05/03/2016   HLD (hyperlipidemia)  05/03/2016   GERD (gastroesophageal reflux disease) 04/25/2016   Hyperuricemia 04/25/2016   Low back pain without sciatica 04/25/2016   Degenerative arthritis of left knee 04/25/2016   Metatarsalgia of both feet 04/25/2016   Gout 04/25/2016   Migraine headache 04/25/2016   Nuclear sclerosis of both eyes 04/25/2016   History of colonic polyps 04/25/2016   VBI (vertebrobasilar insufficiency) 04/25/2016   Vertigo 03/10/2014   Generalized convulsive epilepsy (HCC) 11/12/2013   Choroidal nevus, right 10/12/2013   Vitreous floaters 10/12/2013    PCP: Hughie Sharper, MD  REFERRING PROVIDER: Hughie Sharper, MD  REFERRING DIAG: R53.81 (ICD-10-CM) - Debility  THERAPY DIAG:  Unsteadiness on feet  Other abnormalities of gait and mobility  Muscle weakness (generalized)  Rationale for Evaluation and Treatment: Rehabilitation  ONSET DATE: 09/28/23  SUBJECTIVE:   SUBJECTIVE STATEMENT: Reports that his neck was sore from last session. Feeling pretty good with dizziness.    PERTINENT HISTORY: history of left temporal hemorrhage with encephalomalacia, petit mall disorder, BPPV, vertebrobasilar insufficiency, prostate cancer, gait disorder who was admitted to hospital on 09/19/2023 with dizziness and fall with inability to get up.   Admitted to CIR 09/23/23 and discharged home 09/28/23  PAIN:  Are you having pain? Yes: NPRS scale: 2/10 Pain location: neck Pain description: sore Aggravating factors: certain positions Relieving factors: nothing  PRECAUTIONS: Fall  RED FLAGS: None   WEIGHT BEARING RESTRICTIONS: No  FALLS:  Has patient fallen in last 6 months? No  LIVING ENVIRONMENT: Lives with: lives with their spouse Lives in: House/apartment Stairs: 3 steps in the front, side door is flat entry, 14-16 stairs inside house (L side rail) goes down one step at a time Has following equipment at home: Single point cane, Quad cane small base, Quad cane large base, and Walker - 2  wheeled  OCCUPATION: Retired; watching TV most days and reading, tries to go out once a day and walk in the park  PLOF: Independent  PATIENT GOALS: Improve walking and balance  NEXT MD VISIT: n/a  OBJECTIVE:    TODAY'S TREATMENT: 12/18/23 Activity Comments     standing D2 flexion to cone on floor 2x5 each Significant posterior LOB with min-mod A to recover on L upper diagonal. Required slow reps with break to stop and stabilize   Standing diagonal head movements 2x30 In corner for safety d/t posterior sway  bending to place cone on floor, fwd/side step over it  C/o mild lightheadedness; CGA-min A for instability. Rest break required to recover  Forward/backwards walking + ball toss L veer, discontinuous steps and limited control of speed backwards       PATIENT EDUCATION: Education details: discussed POC and plan for DC if pt continues to progress as anticipated, answered pt's questions, HEP update with edu for safety  Person educated: Patient Education method: Explanation, Demonstration, Tactile cues, Verbal cues, and Handouts Education comprehension: verbalized understanding and returned demonstration     HOME EXERCISE PROGRAM: Access Code: [email or web address] URL: [email or web address] Date: 12/18/2023 Prepared by: [facility] - Outpatient  Rehab - [facility]  Exercises - Resisted Sit-to-Stand With Dumbbell at Chest  - 1 x daily - 7 x weekly - 3 sets - 10 reps - Corner Balance Feet Together With Eyes Closed  - 1 x daily - 7 x weekly - 3 sets - 10 reps - Standing Bent Over Single Arm Scapular Row with Table Support  - 1 x daily - 7 x weekly - 3 sets - 10 reps - Corner Balance Feet Together: Eyes Closed With Head Turns  - 1 x daily - 7 x weekly - 1 sets - 5 reps - Standing Romberg to 1/2 Tandem Stance  - 1 x daily - 7 x weekly - 1 sets - 3 reps - 30 sec hold - Tandem Walking with Counter Support  - 1 x daily - 7 x weekly - 1 sets - 3 reps - Standing Diagonal Head  Nod Vestibular Habituation  - 1 x daily - 5 x weekly - 2-3 sets - 10 reps      Note: Objective measures were completed at Evaluation unless otherwise noted.  DIAGNOSTIC FINDINGS: 09/19/23 Brain MRI IMPRESSION: 1. No evidence of acute intracranial abnormality. 2. Sequela of remote hemorrhage in the anterior left temporal lobe  CTA head heck was negative for LVO and showed chronic R-VA occlusion or poor flow, moderate stenosis proximal BA, high grade stenosis R-ICA 65-70%, severe stenosis L-ACA origin, 50% stenosis L-CCA and L-ICA bulb and up to moderate stenosis distal left PCA, R-ACA A2 and L-MCA M1 segment. CT abdomen pelvis done revealing bladder wall thickening with pericystic edema suspicious for cystitis and hyperattenuation in dependent bladder favored to be early contrast secretion  rather than new bladder stone. 2D echo showed EF 60-65% with mild LVH.   PATIENT SURVEYS:  PSFS: THE PATIENT SPECIFIC FUNCTIONAL SCALE  Place score of 0-10 (0 = unable to perform activity and 10 = able to perform activity at the same level as before injury or problem)  Activity Date: 10/30/23    Walking 7 (catches himself more to get his balance, shaky)    2.  Standing balance 5     3.     4.      Total Score 6      Total Score = Sum of activity scores/number of activities  Minimally Detectable Change: 3 points (for single activity); 2 points (for average score)  [facility] (nd). The Patient Specific Functional Scale . Retrieved from [email or web address]   COGNITION: Overall cognitive status: Within functional limits for tasks assessed     SENSATION: Neuropathy in bilat feet but can feel more in his arch  EDEMA:  None  MUSCLE LENGTH: Did not assess  PALPATION: Did not assess  LOWER EXTREMITY ROM: WNL  LOWER EXTREMITY MMT:  MMT Right eval Left eval  Hip flexion 4+ 4  Hip extension    Hip abduction 5 5  Hip  adduction    Hip internal rotation    Hip external rotation    Knee flexion 4+ 4+  Knee extension 4+ 4+  Ankle dorsiflexion    Ankle plantarflexion    Ankle inversion    Ankle eversion     (Blank rows = not tested)  LOWER EXTREMITY SPECIAL TESTS:  Did not assess  FUNCTIONAL TESTS:  5 times sit to stand: 8.14 sec; LEs against bed for stability, hands on knees FGA: 21/30   M-CTSIB  Condition 1: Firm Surface, EO 30 Sec, Normal Sway  Condition 2: Firm Surface, EC 30 Sec, Mild Sway  Condition 3: Foam Surface, EO 30 Sec, Mild Sway  Condition 4: Foam Surface, EC 20 Sec, Moderate and Severe Sway      GAIT: Distance walked: Into clinic; at least 200' Assistive device utilized: None Level of assistance: Complete Independence Comments: Amb 10 meters in 10.26 sec = 0.97 m/s. Widened BOS, R foot externally rotated, slap foot R>L  VESTIBULAR ASSESSMENT:  GENERAL OBSERVATION: n/a   SYMPTOM BEHAVIOR:  Subjective history: Reports no attacks of imbalance and unsteadiness since leaving the hospital but general unsteadiness. Has had PT in the past for dizziness. Went to one clinic and got what sounds like canalith repositioning and then went to another clinic who performed primarily balance work.   Non-Vestibular symptoms: None  Type of dizziness: Imbalance (Disequilibrium), Unsteady with head/body turns, and Lightheadedness/Faint  Frequency: No attacks since leaving the hospital but still feels unsteady  Duration: none  Aggravating factors: No known aggravating factors  Relieving factors: no known relieving factors  Progression of symptoms: better  OCULOMOTOR EXAM:  Ocular Alignment: normal  Ocular ROM: No Limitations  Spontaneous Nystagmus: absent  Gaze-Induced Nystagmus: absent  Smooth Pursuits: intact  Saccades: intact   R eye has one black dot and L eye has multiple black dots and floaters but pt reports this is normal  FRENZEL - FIXATION SUPRESSED: Did not  assesss  VESTIBULAR - OCULAR REFLEX:   Slow VOR: Normal  VOR Cancellation: Normal  Head-Impulse Test: HIT Right: negative HIT Left: positive  Dynamic Visual Acuity: TBA   POSITIONAL TESTING:  Sidelying Dix-hallpike R: (-) -- some dizziness/unsteadiness with return to sitting Sidelying Dix-hallpike L: (-) -- some dizziness/unsteadiness  with return to sitting   MOTION SENSITIVITY: not performed   OTHOSTATICS: not done                                                                                                                               ASSESSMENT:  CLINICAL IMPRESSION: Patient arrived to session with report of neck soreness but reports stable dizziness. Worked on diagonal habituation activities with patient demonstrating significant LOB in L diagonals. Modified to include head movements only which was a safer version to be performed at home. Pt reported remaining lightheadedness with bending tasks which resolved with rest break. Patient tolerated session well and without complaints upon leaving.   OBJECTIVE IMPAIRMENTS: Abnormal gait, decreased balance, decreased coordination, decreased endurance, decreased mobility, difficulty walking, decreased ROM, decreased strength, dizziness, impaired UE functional use, improper body mechanics, postural dysfunction, and pain.   ACTIVITY LIMITATIONS: lifting, bending, standing, squatting, stairs, transfers, bed mobility, locomotion level, and caring for others  PARTICIPATION LIMITATIONS: shopping, community activity, and yard work  PERSONAL FACTORS: Age, Fitness, Past/current experiences, and Time since onset of injury/illness/exacerbation are also affecting patient's functional outcome.   REHAB POTENTIAL: Good  CLINICAL DECISION MAKING: Evolving/moderate complexity  EVALUATION COMPLEXITY: Moderate   GOALS: Goals reviewed with patient? Yes  SHORT TERM GOALS: Target date: 11/27/2023  Pt will be ind with initial HEP Baseline: pt  reports performing HEP 11/25/23 Goal status: MET 11/25/23  2.  Pt will report improved dizziness by >/=25% Baseline: pt reports remaining lightheadedness but denies dizziness 11/25/23 Goal status: NOT MET 11/25/23   LONG TERM GOALS: Target date: 12/25/2023   Pt will be ind with management and progression of HEP Baseline:  Goal status: IN PROGRESS  2.  Pt will have improved FGA to >/=28/30 for reduced fall risk Baseline: 21>24/30 Goal status: IN PROGRESS  3.  Pt will have improved gait speed to >/=1.1 m/s to be considered high functioning Baseline: 0.97 m/s Goal status: IN PROGRESS  4.  Pt will have improved PSFS average score to >/=8 to demo MCID Baseline: 6 Goal status: IN PROGRESS  5.  Pt will report improved dizzy symptoms by >/=50% Baseline:  Goal status: IN PROGRESS    PLAN:  PT FREQUENCY: 2x/week  PT DURATION: 8 weeks  PLANNED INTERVENTIONS: [id number]- PT Re-evaluation, 97750- Physical Performance Testing, 97110-Therapeutic exercises, 97530- Therapeutic activity, [id number]- Neuromuscular re-education, 97535- Self Care, [id number]- Manual therapy, [phone]- Gait training, [phone]- Canalith repositioning, [id number]- Aquatic Therapy, [phone]- Electrical stimulation (unattended), 20560 (1-2 muscles), [id number] (3+ muscles)- Dry Needling, Patient/Family education, Balance training, Stair training, Taping, Joint mobilization, Spinal mobilization, Vestibular training, Cryotherapy, and Moist heat  PLAN FOR NEXT SESSION:   Recheck for BPPV as needed; watch for orthostatic sx with bending; continue work on reactive balance, and dynamic visual acuity. Progress balance, gaze stabilization HEP, working towards LTGs.     Louana Terrilyn Christians, PT, DPT 12/18/23 9:28 AM  [facility] at [facility] [address] [place], [place] [address] Phone # [phone] Fax # [phone]

## 2023-12-18 ENCOUNTER — Ambulatory Visit: Admitting: Physical Therapy

## 2023-12-18 ENCOUNTER — Encounter: Payer: Self-pay | Admitting: Physical Therapy

## 2023-12-18 DIAGNOSIS — R2689 Other abnormalities of gait and mobility: Secondary | ICD-10-CM

## 2023-12-18 DIAGNOSIS — M6281 Muscle weakness (generalized): Secondary | ICD-10-CM | POA: Diagnosis not present

## 2023-12-18 DIAGNOSIS — R2681 Unsteadiness on feet: Secondary | ICD-10-CM

## 2023-12-19 NOTE — Therapy (Signed)
 OUTPATIENT PHYSICAL THERAPY LOWER EXTREMITY TREATMENT NOTE   Patient Name: Brandon Flores MRN: [id number] DOB:1946/01/08, 78 y.o., male Today's Date: 12/23/2023      END OF SESSION:  PT End of Session - 12/23/23 0903     Visit Number 15    Number of Visits 16    Date for Recertification  12/25/23    Authorization Type Medicare    Progress Note Due on Visit 10    PT Start Time 0845    PT Stop Time 0927    PT Time Calculation (min) 42 min    Equipment Utilized During Treatment Gait belt    Activity Tolerance Patient tolerated treatment well    Behavior During Therapy WFL for tasks assessed/performed                     Past Medical History:  Diagnosis Date   Cervical radiculopathy    Colon polyp    (2006- adenoma and tubulovillous adenoma; 2010-adenomas; repeat 201)-Dr. Vicci   Complex partial seizure disorder (HCC) 10/08/2014   COVID-19 2021   GERD (gastroesophageal reflux disease)    Gout    Hypercholesterolemia    Lateral epicondylitis    2002   Migraine    headaches   Ophthalmalgia    Dr. Roz   Orthodontics    Dr. Hughie   Partial epilepsy with impairment of consciousness (HCC)    Plantar fasciitis    Prostate cancer (HCC)    Seizure disorder (HCC)    Dr. Cleotilde ([facility])   Seizures [facility])    Traumatic incomplete tear of left rotator cuff 2024   UTI (urinary tract infection)    Vertebrobasilar insufficiency    Vertigo    Past Surgical History:  Procedure Laterality Date   BACK SURGERY     HEMORROIDECTOMY     NECK SURGERY     PROSTATE BIOPSY     Patient Active Problem List   Diagnosis Date Noted   Debility 09/23/2023   Sepsis (HCC) 09/19/2023   Acute cystitis with hematuria 09/19/2023   TIA (transient ischemic attack) 09/19/2023   Encephalomalacia 12/06/2021   Malignant neoplasm of prostate (HCC) 02/03/2020   Obesity (BMI 30-39.9) 08/31/2016   Chronic neck pain 08/31/2016   Cervical radiculopathy 05/03/2016   HLD (hyperlipidemia)  05/03/2016   GERD (gastroesophageal reflux disease) 04/25/2016   Hyperuricemia 04/25/2016   Low back pain without sciatica 04/25/2016   Degenerative arthritis of left knee 04/25/2016   Metatarsalgia of both feet 04/25/2016   Gout 04/25/2016   Migraine headache 04/25/2016   Nuclear sclerosis of both eyes 04/25/2016   History of colonic polyps 04/25/2016   VBI (vertebrobasilar insufficiency) 04/25/2016   Vertigo 03/10/2014   Generalized convulsive epilepsy (HCC) 11/12/2013   Choroidal nevus, right 10/12/2013   Vitreous floaters 10/12/2013    PCP: Hughie Sharper, MD  REFERRING PROVIDER: Hughie Sharper, MD  REFERRING DIAG: R53.81 (ICD-10-CM) - Debility  THERAPY DIAG:  Unsteadiness on feet  Other abnormalities of gait and mobility  Muscle weakness (generalized)  Rationale for Evaluation and Treatment: Rehabilitation  ONSET DATE: 09/28/23  SUBJECTIVE:   SUBJECTIVE STATEMENT: Has a bad case of grout in the L big toe. Just took some medicine. Dizziness still okay lately.    PERTINENT HISTORY: history of left temporal hemorrhage with encephalomalacia, petit mall disorder, BPPV, vertebrobasilar insufficiency, prostate cancer, gait disorder who was admitted to hospital on 09/19/2023 with dizziness and fall with inability to get up.   Admitted to CIR  09/23/23 and discharged home 09/28/23  PAIN:  Are you having pain? Yes: NPRS scale: I notice it Pain location: L great toe Pain description: sore Aggravating factors: wiggling his toe Relieving factors: nothing  PRECAUTIONS: Fall  RED FLAGS: None   WEIGHT BEARING RESTRICTIONS: No  FALLS:  Has patient fallen in last 6 months? No  LIVING ENVIRONMENT: Lives with: lives with their spouse Lives in: House/apartment Stairs: 3 steps in the front, side door is flat entry, 14-16 stairs inside house (L side rail) goes down one step at a time Has following equipment at home: Single point cane, Quad cane small base, Quad cane  large base, and Walker - 2 wheeled  OCCUPATION: Retired; watching TV most days and reading, tries to go out once a day and walk in the park  PLOF: Independent  PATIENT GOALS: Improve walking and balance  NEXT MD VISIT: n/a  OBJECTIVE:     TODAY'S TREATMENT: 12/23/23 Activity Comments  standing diagonal head movements 2x30 each side  Report of 1/10 dizziness to upper L diagonal. Much improved stability   standing D2 flexion to cone on floor 2x5 each  Pt reports some L posterior hip pain and wore out. Balance improved compared to last session- moderate sway   gait + head turns/nods  C/o more difficulty with turns   STS + ball toss 2x10 Some SOB after activity which resolved after short rest break  1/2 turns to targets + alt toe taps on cone  In II bars; weaned to no UE support   Sidestepping over cones In II bars; c/o slight lightheadedness         PATIENT EDUCATION: Education details: discussed probable DC next session  Person educated: Patient Education method: Explanation Education comprehension: verbalized understanding    HOME EXERCISE PROGRAM: Access Code: [email or web address] URL: [email or web address] Date: 12/18/2023 Prepared by: [facility] - Outpatient  Rehab - [facility]  Exercises - Resisted Sit-to-Stand With Dumbbell at Chest  - 1 x daily - 7 x weekly - 3 sets - 10 reps - Corner Balance Feet Together With Eyes Closed  - 1 x daily - 7 x weekly - 3 sets - 10 reps - Standing Bent Over Single Arm Scapular Row with Table Support  - 1 x daily - 7 x weekly - 3 sets - 10 reps - Corner Balance Feet Together: Eyes Closed With Head Turns  - 1 x daily - 7 x weekly - 1 sets - 5 reps - Standing Romberg to 1/2 Tandem Stance  - 1 x daily - 7 x weekly - 1 sets - 3 reps - 30 sec hold - Tandem Walking with Counter Support  - 1 x daily - 7 x weekly - 1 sets - 3 reps - Standing Diagonal Head Nod Vestibular Habituation  - 1 x daily - 5 x weekly - 2-3 sets - 10  reps      Note: Objective measures were completed at Evaluation unless otherwise noted.  DIAGNOSTIC FINDINGS: 09/19/23 Brain MRI IMPRESSION: 1. No evidence of acute intracranial abnormality. 2. Sequela of remote hemorrhage in the anterior left temporal lobe  CTA head heck was negative for LVO and showed chronic R-VA occlusion or poor flow, moderate stenosis proximal BA, high grade stenosis R-ICA 65-70%, severe stenosis L-ACA origin, 50% stenosis L-CCA and L-ICA bulb and up to moderate stenosis distal left PCA, R-ACA A2 and L-MCA M1 segment. CT abdomen pelvis done revealing bladder wall thickening with pericystic edema suspicious for cystitis and hyperattenuation in  dependent bladder favored to be early contrast secretion rather than new bladder stone. 2D echo showed EF 60-65% with mild LVH.   PATIENT SURVEYS:  PSFS: THE PATIENT SPECIFIC FUNCTIONAL SCALE  Place score of 0-10 (0 = unable to perform activity and 10 = able to perform activity at the same level as before injury or problem)  Activity Date: 10/30/23    Walking 7 (catches himself more to get his balance, shaky)    2.  Standing balance 5     3.     4.      Total Score 6      Total Score = Sum of activity scores/number of activities  Minimally Detectable Change: 3 points (for single activity); 2 points (for average score)  [facility] (nd). The Patient Specific Functional Scale . Retrieved from [email or web address]   COGNITION: Overall cognitive status: Within functional limits for tasks assessed     SENSATION: Neuropathy in bilat feet but can feel more in his arch  EDEMA:  None  MUSCLE LENGTH: Did not assess  PALPATION: Did not assess  LOWER EXTREMITY ROM: WNL  LOWER EXTREMITY MMT:  MMT Right eval Left eval  Hip flexion 4+ 4  Hip extension    Hip abduction 5 5  Hip adduction    Hip internal rotation    Hip external rotation    Knee  flexion 4+ 4+  Knee extension 4+ 4+  Ankle dorsiflexion    Ankle plantarflexion    Ankle inversion    Ankle eversion     (Blank rows = not tested)  LOWER EXTREMITY SPECIAL TESTS:  Did not assess  FUNCTIONAL TESTS:  5 times sit to stand: 8.14 sec; LEs against bed for stability, hands on knees FGA: 21/30   M-CTSIB  Condition 1: Firm Surface, EO 30 Sec, Normal Sway  Condition 2: Firm Surface, EC 30 Sec, Mild Sway  Condition 3: Foam Surface, EO 30 Sec, Mild Sway  Condition 4: Foam Surface, EC 20 Sec, Moderate and Severe Sway      GAIT: Distance walked: Into clinic; at least 200' Assistive device utilized: None Level of assistance: Complete Independence Comments: Amb 10 meters in 10.26 sec = 0.97 m/s. Widened BOS, R foot externally rotated, slap foot R>L  VESTIBULAR ASSESSMENT:  GENERAL OBSERVATION: n/a   SYMPTOM BEHAVIOR:  Subjective history: Reports no attacks of imbalance and unsteadiness since leaving the hospital but general unsteadiness. Has had PT in the past for dizziness. Went to one clinic and got what sounds like canalith repositioning and then went to another clinic who performed primarily balance work.   Non-Vestibular symptoms: None  Type of dizziness: Imbalance (Disequilibrium), Unsteady with head/body turns, and Lightheadedness/Faint  Frequency: No attacks since leaving the hospital but still feels unsteady  Duration: none  Aggravating factors: No known aggravating factors  Relieving factors: no known relieving factors  Progression of symptoms: better  OCULOMOTOR EXAM:  Ocular Alignment: normal  Ocular ROM: No Limitations  Spontaneous Nystagmus: absent  Gaze-Induced Nystagmus: absent  Smooth Pursuits: intact  Saccades: intact   R eye has one black dot and L eye has multiple black dots and floaters but pt reports this is normal  FRENZEL - FIXATION SUPRESSED: Did not assesss  VESTIBULAR - OCULAR REFLEX:   Slow VOR: Normal  VOR Cancellation:  Normal  Head-Impulse Test: HIT Right: negative HIT Left: positive  Dynamic Visual Acuity: TBA   POSITIONAL TESTING:  Sidelying Dix-hallpike R: (-) -- some dizziness/unsteadiness with return to  sitting Sidelying Dix-hallpike L: (-) -- some dizziness/unsteadiness with return to sitting   MOTION SENSITIVITY: not performed   OTHOSTATICS: not done                                                                                                                               ASSESSMENT:  CLINICAL IMPRESSION: Patient arrived to session with report of L great toe pain d/t suspected flare of gout. Reviewed diagonal head and body movements which revealed mild remaining dizziness towards upper L diagonal, but improved stability compared to previous session. Patient demonstrated some SOB with exertion which was resolved with short rest break. Patient tolerated session well and without complaints upon leaving. Plan for DC next session.   OBJECTIVE IMPAIRMENTS: Abnormal gait, decreased balance, decreased coordination, decreased endurance, decreased mobility, difficulty walking, decreased ROM, decreased strength, dizziness, impaired UE functional use, improper body mechanics, postural dysfunction, and pain.   ACTIVITY LIMITATIONS: lifting, bending, standing, squatting, stairs, transfers, bed mobility, locomotion level, and caring for others  PARTICIPATION LIMITATIONS: shopping, community activity, and yard work  PERSONAL FACTORS: Age, Fitness, Past/current experiences, and Time since onset of injury/illness/exacerbation are also affecting patient's functional outcome.   REHAB POTENTIAL: Good  CLINICAL DECISION MAKING: Evolving/moderate complexity  EVALUATION COMPLEXITY: Moderate   GOALS: Goals reviewed with patient? Yes  SHORT TERM GOALS: Target date: 11/27/2023  Pt will be ind with initial HEP Baseline: pt reports performing HEP 11/25/23 Goal status: MET 11/25/23  2.  Pt will report improved  dizziness by >/=25% Baseline: pt reports remaining lightheadedness but denies dizziness 11/25/23 Goal status: NOT MET 11/25/23   LONG TERM GOALS: Target date: 12/25/2023   Pt will be ind with management and progression of HEP Baseline:  Goal status: IN PROGRESS  2.  Pt will have improved FGA to >/=28/30 for reduced fall risk Baseline: 21>24/30 Goal status: IN PROGRESS  3.  Pt will have improved gait speed to >/=1.1 m/s to be considered high functioning Baseline: 0.97 m/s Goal status: IN PROGRESS  4.  Pt will have improved PSFS average score to >/=8 to demo MCID Baseline: 6 Goal status: IN PROGRESS  5.  Pt will report improved dizzy symptoms by >/=50% Baseline:  Goal status: IN PROGRESS    PLAN:  PT FREQUENCY: 2x/week  PT DURATION: 8 weeks  PLANNED INTERVENTIONS: [id number]- PT Re-evaluation, 97750- Physical Performance Testing, 97110-Therapeutic exercises, 97530- Therapeutic activity, [id number]- Neuromuscular re-education, 97535- Self Care, [id number]- Manual therapy, [id number]- Gait training, [phone]- Canalith repositioning, [id number]- Aquatic Therapy, [phone]- Electrical stimulation (unattended), 20560 (1-2 muscles), 20561 (3+ muscles)- Dry Needling, Patient/Family education, Balance training, Stair training, Taping, Joint mobilization, Spinal mobilization, Vestibular training, Cryotherapy, and Moist heat  PLAN FOR NEXT SESSION: goals check and probable DC.  Recheck for BPPV as needed; watch for orthostatic sx with bending; continue work on reactive balance, and dynamic visual acuity. Progress balance, gaze stabilization HEP, working towards LTGs.     Louana Terrilyn Christians, PT, DPT  12/23/23 9:28 AM  [facility] [address] [place], [address] [place], [place] [address] Phone # [phone] Fax # [phone]

## 2023-12-23 ENCOUNTER — Encounter: Payer: Self-pay | Admitting: Physical Therapy

## 2023-12-23 ENCOUNTER — Ambulatory Visit: Admitting: Physical Therapy

## 2023-12-23 DIAGNOSIS — R2681 Unsteadiness on feet: Secondary | ICD-10-CM | POA: Diagnosis not present

## 2023-12-23 DIAGNOSIS — M6281 Muscle weakness (generalized): Secondary | ICD-10-CM | POA: Diagnosis not present

## 2023-12-23 DIAGNOSIS — R2689 Other abnormalities of gait and mobility: Secondary | ICD-10-CM

## 2023-12-24 NOTE — Therapy (Signed)
 OUTPATIENT PHYSICAL THERAPY LOWER EXTREMITY TREATMENT NOTE   Patient Name: Brandon Flores MRN: [id number] DOB:01-22-1946, 78 y.o., male Today's Date: 12/24/2023      END OF SESSION:               Past Medical History:  Diagnosis Date   Cervical radiculopathy    Colon polyp    (2006- adenoma and tubulovillous adenoma; 2010-adenomas; repeat 201)-Dr. Vicci   Complex partial seizure disorder (HCC) 10/08/2014   COVID-19 2021   GERD (gastroesophageal reflux disease)    Gout    Hypercholesterolemia    Lateral epicondylitis    2002   Migraine    headaches   Ophthalmalgia    Dr. Roz   Orthodontics    Dr. Hughie   Partial epilepsy with impairment of consciousness (HCC)    Plantar fasciitis    Prostate cancer (HCC)    Seizure disorder (HCC)    Dr. Cleotilde ([facility])   Seizures [facility])    Traumatic incomplete tear of left rotator cuff 2024   UTI (urinary tract infection)    Vertebrobasilar insufficiency    Vertigo    Past Surgical History:  Procedure Laterality Date   BACK SURGERY     HEMORROIDECTOMY     NECK SURGERY     PROSTATE BIOPSY     Patient Active Problem List   Diagnosis Date Noted   Debility 09/23/2023   Sepsis (HCC) 09/19/2023   Acute cystitis with hematuria 09/19/2023   TIA (transient ischemic attack) 09/19/2023   Encephalomalacia 12/06/2021   Malignant neoplasm of prostate (HCC) 02/03/2020   Obesity (BMI 30-39.9) 08/31/2016   Chronic neck pain 08/31/2016   Cervical radiculopathy 05/03/2016   HLD (hyperlipidemia) 05/03/2016   GERD (gastroesophageal reflux disease) 04/25/2016   Hyperuricemia 04/25/2016   Low back pain without sciatica 04/25/2016   Degenerative arthritis of left knee 04/25/2016   Metatarsalgia of both feet 04/25/2016   Gout 04/25/2016   Migraine headache 04/25/2016   Nuclear sclerosis of both eyes 04/25/2016   History of colonic polyps 04/25/2016   VBI (vertebrobasilar insufficiency) 04/25/2016   Vertigo 03/10/2014    Generalized convulsive epilepsy (HCC) 11/12/2013   Choroidal nevus, right 10/12/2013   Vitreous floaters 10/12/2013    PCP: Hughie Sharper, MD  REFERRING PROVIDER: Hughie Sharper, MD  REFERRING DIAG: R53.81 (ICD-10-CM) - Debility  THERAPY DIAG:  No diagnosis found.  Rationale for Evaluation and Treatment: Rehabilitation  ONSET DATE: 09/28/23  SUBJECTIVE:   SUBJECTIVE STATEMENT: Has a bad case of grout in the L big toe. Just took some medicine. Dizziness still okay lately.    PERTINENT HISTORY: history of left temporal hemorrhage with encephalomalacia, petit mall disorder, BPPV, vertebrobasilar insufficiency, prostate cancer, gait disorder who was admitted to hospital on 09/19/2023 with dizziness and fall with inability to get up.   Admitted to CIR 09/23/23 and discharged home 09/28/23  PAIN:  Are you having pain? Yes: NPRS scale: I notice it Pain location: L great toe Pain description: sore Aggravating factors: wiggling his toe Relieving factors: nothing  PRECAUTIONS: Fall  RED FLAGS: None   WEIGHT BEARING RESTRICTIONS: No  FALLS:  Has patient fallen in last 6 months? No  LIVING ENVIRONMENT: Lives with: lives with their spouse Lives in: House/apartment Stairs: 3 steps in the front, side door is flat entry, 14-16 stairs inside house (L side rail) goes down one step at a time Has following equipment at home: Single point cane, Quad cane small base, Quad cane large base, and Walker -  2 wheeled  OCCUPATION: Retired; watching TV most days and reading, tries to go out once a day and walk in the park  PLOF: Independent  PATIENT GOALS: Improve walking and balance  NEXT MD VISIT: n/a  OBJECTIVE:     TODAY'S TREATMENT: 12/25/23 Activity Comments                      PATIENT SURVEYS:  PSFS: THE PATIENT SPECIFIC FUNCTIONAL SCALE  Place score of 0-10 (0 = unable to perform activity and 10 = able to perform activity at the same level as before injury or  problem)  Activity Date: 10/30/23 12/25/23   Walking 7 (catches himself more to get his balance, shaky)    2.  Standing balance 5     3.     4.      Total Score 6      Total Score = Sum of activity scores/number of activities  Minimally Detectable Change: 3 points (for single activity); 2 points (for average score)  [facility] (nd). The Patient Specific Functional Scale . Retrieved from [email or web address]      TODAY'S TREATMENT: 12/23/23 Activity Comments  standing diagonal head movements 2x30 each side  Report of 1/10 dizziness to upper L diagonal. Much improved stability   standing D2 flexion to cone on floor 2x5 each  Pt reports some L posterior hip pain and wore out. Balance improved compared to last session- moderate sway   gait + head turns/nods  C/o more difficulty with turns   STS + ball toss 2x10 Some SOB after activity which resolved after short rest break  1/2 turns to targets + alt toe taps on cone  In II bars; weaned to no UE support   Sidestepping over cones In II bars; c/o slight lightheadedness         PATIENT EDUCATION: Education details: discussed probable DC next session  Person educated: Patient Education method: Explanation Education comprehension: verbalized understanding    HOME EXERCISE PROGRAM: Access Code: [email or web address] URL: [email or web address] Date: 12/18/2023 Prepared by: [facility] Outpatient  Re[facility] - [facility]  Exercises - Resisted Sit-to-Stand With Dumbbell at Chest  - 1 x daily - 7 x weekly - 3 sets - 10 reps - Corner Balance Feet Together With Eyes Closed  - 1 x daily - 7 x weekly - 3 sets - 10 reps - Standing Bent Over Single Arm Scapular Row with Table Support  - 1 x daily - 7 x weekly - 3 sets - 10 reps - Corner Balance Feet Together: Eyes Closed With Head Turns  - 1 x daily - 7 x weekly - 1 sets - 5 reps - Standing Romberg to 1/2 Tandem Stance  -  1 x daily - 7 x weekly - 1 sets - 3 reps - 30 sec hold - Tandem Walking with Counter Support  - 1 x daily - 7 x weekly - 1 sets - 3 reps - Standing Diagonal Head Nod Vestibular Habituation  - 1 x daily - 5 x weekly - 2-3 sets - 10 reps      Note: Objective measures were completed at Evaluation unless otherwise noted.  DIAGNOSTIC FINDINGS: 09/19/23 Brain MRI IMPRESSION: 1. No evidence of acute intracranial abnormality. 2. Sequela of remote hemorrhage in the anterior left temporal lobe  CTA head heck was negative for LVO and showed chronic R-VA occlusion or poor flow, moderate stenosis proximal BA, high grade stenosis R-ICA 65-70%, severe stenosis  L-ACA origin, 50% stenosis L-CCA and L-ICA bulb and up to moderate stenosis distal left PCA, R-ACA A2 and L-MCA M1 segment. CT abdomen pelvis done revealing bladder wall thickening with pericystic edema suspicious for cystitis and hyperattenuation in dependent bladder favored to be early contrast secretion rather than new bladder stone. 2D echo showed EF 60-65% with mild LVH.   PATIENT SURVEYS:  PSFS: THE PATIENT SPECIFIC FUNCTIONAL SCALE  Place score of 0-10 (0 = unable to perform activity and 10 = able to perform activity at the same level as before injury or problem)  Activity Date: 10/30/23    Walking 7 (catches himself more to get his balance, shaky)    2.  Standing balance 5     3.     4.      Total Score 6      Total Score = Sum of activity scores/number of activities  Minimally Detectable Change: 3 points (for single activity); 2 points (for average score)  [facility] (nd). The Patient Specific Functional Scale . Retrieved from [email or web address]   COGNITION: Overall cognitive status: Within functional limits for tasks assessed     SENSATION: Neuropathy in bilat feet but can feel more in his arch  EDEMA:  None  MUSCLE LENGTH: Did not assess  PALPATION: Did  not assess  LOWER EXTREMITY ROM: WNL  LOWER EXTREMITY MMT:  MMT Right eval Left eval  Hip flexion 4+ 4  Hip extension    Hip abduction 5 5  Hip adduction    Hip internal rotation    Hip external rotation    Knee flexion 4+ 4+  Knee extension 4+ 4+  Ankle dorsiflexion    Ankle plantarflexion    Ankle inversion    Ankle eversion     (Blank rows = not tested)  LOWER EXTREMITY SPECIAL TESTS:  Did not assess  FUNCTIONAL TESTS:  5 times sit to stand: 8.14 sec; LEs against bed for stability, hands on knees FGA: 21/30   M-CTSIB  Condition 1: Firm Surface, EO 30 Sec, Normal Sway  Condition 2: Firm Surface, EC 30 Sec, Mild Sway  Condition 3: Foam Surface, EO 30 Sec, Mild Sway  Condition 4: Foam Surface, EC 20 Sec, Moderate and Severe Sway      GAIT: Distance walked: Into clinic; at least 200' Assistive device utilized: None Level of assistance: Complete Independence Comments: Amb 10 meters in 10.26 sec = 0.97 m/s. Widened BOS, R foot externally rotated, slap foot R>L  VESTIBULAR ASSESSMENT:  GENERAL OBSERVATION: n/a   SYMPTOM BEHAVIOR:  Subjective history: Reports no attacks of imbalance and unsteadiness since leaving the hospital but general unsteadiness. Has had PT in the past for dizziness. Went to one clinic and got what sounds like canalith repositioning and then went to another clinic who performed primarily balance work.   Non-Vestibular symptoms: None  Type of dizziness: Imbalance (Disequilibrium), Unsteady with head/body turns, and Lightheadedness/Faint  Frequency: No attacks since leaving the hospital but still feels unsteady  Duration: none  Aggravating factors: No known aggravating factors  Relieving factors: no known relieving factors  Progression of symptoms: better  OCULOMOTOR EXAM:  Ocular Alignment: normal  Ocular ROM: No Limitations  Spontaneous Nystagmus: absent  Gaze-Induced Nystagmus: absent  Smooth Pursuits: intact  Saccades:  intact   R eye has one black dot and L eye has multiple black dots and floaters but pt reports this is normal  FRENZEL - FIXATION SUPRESSED: Did not assesss  VESTIBULAR - OCULAR  REFLEX:   Slow VOR: Normal  VOR Cancellation: Normal  Head-Impulse Test: HIT Right: negative HIT Left: positive  Dynamic Visual Acuity: TBA   POSITIONAL TESTING:  Sidelying Dix-hallpike R: (-) -- some dizziness/unsteadiness with return to sitting Sidelying Dix-hallpike L: (-) -- some dizziness/unsteadiness with return to sitting   MOTION SENSITIVITY: not performed   OTHOSTATICS: not done                                                                                                                               ASSESSMENT:  CLINICAL IMPRESSION: Patient arrived to session with report of L great toe pain d/t suspected flare of gout. Reviewed diagonal head and body movements which revealed mild remaining dizziness towards upper L diagonal, but improved stability compared to previous session. Patient demonstrated some SOB with exertion which was resolved with short rest break. Patient tolerated session well and without complaints upon leaving. Plan for DC next session.   OBJECTIVE IMPAIRMENTS: Abnormal gait, decreased balance, decreased coordination, decreased endurance, decreased mobility, difficulty walking, decreased ROM, decreased strength, dizziness, impaired UE functional use, improper body mechanics, postural dysfunction, and pain.   ACTIVITY LIMITATIONS: lifting, bending, standing, squatting, stairs, transfers, bed mobility, locomotion level, and caring for others  PARTICIPATION LIMITATIONS: shopping, community activity, and yard work  PERSONAL FACTORS: Age, Fitness, Past/current experiences, and Time since onset of injury/illness/exacerbation are also affecting patient's functional outcome.   REHAB POTENTIAL: Good  CLINICAL DECISION MAKING: Evolving/moderate complexity  EVALUATION COMPLEXITY:  Moderate   GOALS: Goals reviewed with patient? Yes  SHORT TERM GOALS: Target date: 11/27/2023  Pt will be ind with initial HEP Baseline: pt reports performing HEP 11/25/23 Goal status: MET 11/25/23  2.  Pt will report improved dizziness by >/=25% Baseline: pt reports remaining lightheadedness but denies dizziness 11/25/23 Goal status: NOT MET 11/25/23   LONG TERM GOALS: Target date: 12/25/2023   Pt will be ind with management and progression of HEP Baseline:  Goal status: IN PROGRESS  2.  Pt will have improved FGA to >/=28/30 for reduced fall risk Baseline: 21>24/30 Goal status: IN PROGRESS  3.  Pt will have improved gait speed to >/=1.1 m/s to be considered high functioning Baseline: 0.97 m/s Goal status: IN PROGRESS  4.  Pt will have improved PSFS average score to >/=8 to demo MCID Baseline: 6 Goal status: IN PROGRESS  5.  Pt will report improved dizzy symptoms by >/=50% Baseline:  Goal status: IN PROGRESS    PLAN:  PT FREQUENCY: 2x/week  PT DURATION: 8 weeks  PLANNED INTERVENTIONS: 97164- PT Re-evaluation, 97750- Physical Performance Testing, 97110-Therapeutic exercises, 97530- Therapeutic activity, [id number]- Neuromuscular re-education, 97535- Self Care, [id number]- Manual therapy, [phone]- Gait training, [phone]- Canalith repositioning, [id number]- Aquatic Therapy, [phone]- Electrical stimulation (unattended), 20560 (1-2 muscles), [id number] (3+ muscles)- Dry Needling, Patient/Family education, Balance training, Stair training, Taping, Joint mobilization, Spinal mobilization, Vestibular training, Cryotherapy, and Moist heat  PLAN FOR NEXT SESSION: goals check and probable  DC.  Recheck for BPPV as needed; watch for orthostatic sx with bending; continue work on reactive balance, and dynamic visual acuity. Progress balance, gaze stabilization HEP, working towards LTGs.     Louana Terrilyn Christians, PT, DPT 12/24/23 3:01 PM  [facility] at [facility] [address] [place], [address] [place], [place] [address] Phone # [phone] Fax # [phone]

## 2023-12-25 ENCOUNTER — Encounter: Payer: Self-pay | Admitting: Physical Therapy

## 2023-12-25 ENCOUNTER — Ambulatory Visit: Admitting: Physical Therapy

## 2023-12-25 DIAGNOSIS — R2689 Other abnormalities of gait and mobility: Secondary | ICD-10-CM | POA: Diagnosis not present

## 2023-12-25 DIAGNOSIS — M6281 Muscle weakness (generalized): Secondary | ICD-10-CM | POA: Diagnosis not present

## 2023-12-25 DIAGNOSIS — R2681 Unsteadiness on feet: Secondary | ICD-10-CM

## 2024-02-28 ENCOUNTER — Emergency Department (HOSPITAL_BASED_OUTPATIENT_CLINIC_OR_DEPARTMENT_OTHER)
Admission: EM | Admit: 2024-02-28 | Discharge: 2024-02-28 | Disposition: A | Discharge status: Home / Self Care | Attending: Emergency Medicine | Admitting: Emergency Medicine

## 2024-02-28 ENCOUNTER — Other Ambulatory Visit: Payer: Self-pay

## 2024-02-28 ENCOUNTER — Encounter (HOSPITAL_BASED_OUTPATIENT_CLINIC_OR_DEPARTMENT_OTHER): Payer: Self-pay | Admitting: Emergency Medicine

## 2024-02-28 DIAGNOSIS — N3000 Acute cystitis without hematuria: Secondary | ICD-10-CM | POA: Insufficient documentation

## 2024-02-28 DIAGNOSIS — R300 Dysuria: Secondary | ICD-10-CM | POA: Diagnosis present

## 2024-02-28 DIAGNOSIS — Z7982 Long term (current) use of aspirin: Secondary | ICD-10-CM | POA: Insufficient documentation

## 2024-02-28 LAB — CBC WITH DIFFERENTIAL/PLATELET
Abs Immature Granulocytes: 0.02 K/uL (ref 0.00–0.07)
Basophils Absolute: 0 K/uL (ref 0.0–0.1)
Basophils Relative: 0 %
Eosinophils Absolute: 0.2 K/uL (ref 0.0–0.5)
Eosinophils Relative: 4 %
HCT: 42.4 % (ref 39.0–52.0)
Hemoglobin: 14.5 g/dL (ref 13.0–17.0)
Immature Granulocytes: 0 %
Lymphocytes Relative: 26 %
Lymphs Abs: 1.4 K/uL (ref 0.7–4.0)
MCH: 31.3 pg (ref 26.0–34.0)
MCHC: 34.2 g/dL (ref 30.0–36.0)
MCV: 91.4 fL (ref 80.0–100.0)
Monocytes Absolute: 0.5 K/uL (ref 0.1–1.0)
Monocytes Relative: 8 %
Neutro Abs: 3.4 K/uL (ref 1.7–7.7)
Neutrophils Relative %: 62 %
Platelets: 190 K/uL (ref 150–400)
RBC: 4.64 MIL/uL (ref 4.22–5.81)
RDW: 13.2 % (ref 11.5–15.5)
WBC: 5.5 K/uL (ref 4.0–10.5)
nRBC: 0 % (ref 0.0–0.2)

## 2024-02-28 LAB — COMPREHENSIVE METABOLIC PANEL WITH GFR
ALT: 9 U/L (ref 0–44)
AST: 16 U/L (ref 15–41)
Albumin: 3.7 g/dL (ref 3.5–5.0)
Alkaline Phosphatase: 87 U/L (ref 38–126)
Anion gap: 11 (ref 5–15)
BUN: 21 mg/dL (ref 8–23)
CO2: 26 mmol/L (ref 22–32)
Calcium: 9.4 mg/dL (ref 8.9–10.3)
Chloride: 104 mmol/L (ref 98–111)
Creatinine, Ser: 1.1 mg/dL (ref 0.61–1.24)
GFR, Estimated: >60 mL/min (ref >60)
Glucose, Bld: 137 mg/dL — ABNORMAL HIGH (ref 70–99)
Potassium: 4.3 mmol/L (ref 3.5–5.1)
Sodium: 141 mmol/L (ref 135–145)
Total Bilirubin: 0.3 mg/dL (ref 0.0–1.2)
Total Protein: 6.8 g/dL (ref 6.5–8.1)

## 2024-02-28 LAB — URINALYSIS, ROUTINE W REFLEX MICROSCOPIC
Bilirubin Urine: NEGATIVE
Glucose, UA: NEGATIVE mg/dL
Hgb urine dipstick: NEGATIVE
Ketones, ur: NEGATIVE mg/dL
Leukocytes,Ua: NEGATIVE
Nitrite: NEGATIVE
Specific Gravity, Urine: 1.026 (ref 1.005–1.030)
pH: 5.5 (ref 5.0–8.0)

## 2024-02-28 MED ORDER — CEPHALEXIN 250 MG PO CAPS
500.0000 mg | ORAL_CAPSULE | Freq: Once | ORAL | Status: AC
  Administered 2024-02-28: 500 mg via ORAL
  Filled 2024-02-28: qty 2

## 2024-02-28 MED ORDER — CEPHALEXIN 500 MG PO CAPS: 500.0000 mg | ORAL_CAPSULE | Freq: Two times a day (BID) | ORAL | 0 refills | Status: DC

## 2024-02-28 NOTE — ED Provider Notes (Signed)
 [place]  EMERGENCY DEPARTMENT AT [facility] Provider Note   CSN: [id number] Arrival date & time: 02/28/24  1143     Patient presents with: Dysuria   Brandon Flores is a 78 y.o. male.   HPI     78 year old male comes in with chief complaint of burning with urination. Patient reports that he started having burning with urination yesterday.  The symptoms continued today, which prompted him to come to the emergency room.  Patient had his previously had UTI sepsis, and he does not want to wait until he becomes septic.  Patient denies any nausea, vomiting, fevers, chills, back pain.   Prior to Admission medications   Medication Sig Start Date End Date Taking? Authorizing Provider  acetaminophen  (TYLENOL ) 325 MG tablet Take 1-2 tablets (325-650 mg total) by mouth every 4 (four) hours as needed for mild pain (pain score 1-3). 09/25/23   Angiulli, Toribio PARAS, PA-C  allopurinol  (ZYLOPRIM ) 100 MG tablet Take 1 tablet (100 mg total) by mouth daily. 08/19/20   Hilts, Ozell, MD  amLODipine  (NORVASC ) 2.5 MG tablet Take 1 tablet (2.5 mg total) by mouth daily. 09/27/23   Emeline Joesph BROCKS, DO  aspirin  EC 81 MG tablet Take 1 tablet (81 mg total) by mouth daily. Swallow whole. 09/26/23   Angiulli, Toribio PARAS, PA-C  carbamazepine  (TEGRETOL ) 200 MG tablet Take 2 tablets (400 mg total) by mouth 3 (three) times daily. WILL TRY SWITCH TO GENERIC 06/13/23   Gregg Lek, MD  dutasteride  (AVODART ) 0.5 MG capsule Take 1 capsule (0.5 mg total) by mouth every evening. Patient not taking: Reported on 10/30/2023 08/19/20   Hilts, Ozell, MD  levothyroxine  (SYNTHROID ) 112 MCG tablet Take 112 mcg by mouth daily. 09/17/23   [provider]  meclizine  (ANTIVERT ) 25 MG tablet Take 1 tablet (25 mg total) by mouth 3 (three) times daily as needed for dizziness. 06/08/19   Hilts, Ozell, MD  tamsulosin  (FLOMAX ) 0.4 MG CAPS capsule Take 2 capsules (0.8 mg total) by mouth daily after supper. 08/19/20   Hilts,  Ozell, MD    Allergies: Patient has no known allergies.    Review of Systems  All other systems reviewed and are negative.   Updated Vital Signs BP (!) 150/85 (BP Location: Right Arm)   Pulse 69   Temp (!) 97.5 F (36.4 C) (Oral)   Resp 18   SpO2 100%   Physical Exam Vitals and nursing note reviewed.  Constitutional:      Appearance: He is well-developed.  HENT:     Head: Atraumatic.  Eyes:     Extraocular Movements: Extraocular movements intact.     Pupils: Pupils are equal, round, and reactive to light.  Cardiovascular:     Rate and Rhythm: Normal rate.  Pulmonary:     Effort: Pulmonary effort is normal.  Musculoskeletal:     Cervical back: Neck supple.  Skin:    General: Skin is warm.  Neurological:     Mental Status: He is alert and oriented to person, place, and time.     (all labs ordered are listed, but only abnormal results are displayed) Labs Reviewed  URINALYSIS, ROUTINE W REFLEX MICROSCOPIC - Abnormal; Notable for the following components:      Result Value   Protein, ur TRACE (*)    All other components within normal limits  COMPREHENSIVE METABOLIC PANEL WITH GFR - Abnormal; Notable for the following components:   Glucose, Bld 137 (*)    All other components within  normal limits  URINE CULTURE  CBC WITH DIFFERENTIAL/PLATELET    EKG: None  Radiology: No results found.   Procedures   Medications Ordered in the ED  cephALEXin (KEFLEX) capsule 500 mg (has no administration in time range)                                    Medical Decision Making Amount and/or Complexity of Data Reviewed Labs: ordered.  Risk Prescription drug management.   78 year old patient comes in with chief complaint of burning with urination.  I have reviewed patient's records including discharge summary from summer when he was admitted for sepsis.  Patient was found to have E. coli bacteremia, suspected to be urinary source.  I reviewed patient's other  records including antibiotic susceptibilities.  Differential diagnosis for this patient includes acute cystitis, Urethritis.  Patient denies any STI like symptoms.  Low suspicion for kidney stone.  Patient has no abdominal tenderness, nausea, vomiting.   UA ordered.  Urine analysis looks normal.  Patient reassessed.  Confirms with his wife that he is having burning with urination, therefore we will put him on antibiotics.  Urine culture has been sent, as clinically, for a male this is a complicated UTI, and we just want to make sure he does not have resistance.  Final diagnoses:  Acute cystitis without hematuria  Dysuria    ED Discharge Orders     None          Charlyn Sora, MD 02/28/24 1524

## 2024-02-28 NOTE — ED Triage Notes (Signed)
 C/o painful urination since last night. Denies fevers. States was admitted last month for sepsis.

## 2024-02-28 NOTE — Discharge Instructions (Signed)
 You were seen in the emergency room for burning with urination.  The workup in the emergency room is reassuring.  There is no clear signs of infection in the bladder, however clinically, your symptoms are consistent with a UTI and given your previous history of sepsis, we will put you on antibiotics.  We recommend that you follow-up with your primary care doctor in 1 week. Return to the emergency room if you start having worsening pain, fevers, chills, severe nausea and vomiting.

## 2024-02-28 NOTE — ED Notes (Signed)
 Reviewed discharge instructions, medications, and home care with pt. Pt verbalized understanding and had no further questions. Pt exited ED without complications.

## 2024-02-29 LAB — URINE CULTURE: Culture: <10000 — AB

## 2024-05-04 ENCOUNTER — Emergency Department (HOSPITAL_COMMUNITY)

## 2024-05-04 ENCOUNTER — Other Ambulatory Visit: Payer: Self-pay

## 2024-05-04 ENCOUNTER — Observation Stay (HOSPITAL_COMMUNITY)
Admission: EM | Admit: 2024-05-04 | Discharge: 2024-05-04 | Disposition: A | Discharge status: Home / Self Care | Attending: Emergency Medicine | Admitting: Emergency Medicine

## 2024-05-04 ENCOUNTER — Observation Stay (HOSPITAL_COMMUNITY)

## 2024-05-04 DIAGNOSIS — R410 Disorientation, unspecified: Secondary | ICD-10-CM

## 2024-05-04 DIAGNOSIS — G9389 Other specified disorders of brain: Secondary | ICD-10-CM

## 2024-05-04 DIAGNOSIS — I10 Essential (primary) hypertension: Secondary | ICD-10-CM | POA: Insufficient documentation

## 2024-05-04 DIAGNOSIS — G459 Transient cerebral ischemic attack, unspecified: Secondary | ICD-10-CM | POA: Diagnosis not present

## 2024-05-04 DIAGNOSIS — Z7982 Long term (current) use of aspirin: Secondary | ICD-10-CM | POA: Insufficient documentation

## 2024-05-04 DIAGNOSIS — Z79899 Other long term (current) drug therapy: Secondary | ICD-10-CM | POA: Insufficient documentation

## 2024-05-04 DIAGNOSIS — H8110 Benign paroxysmal vertigo, unspecified ear: Secondary | ICD-10-CM | POA: Insufficient documentation

## 2024-05-04 DIAGNOSIS — I6203 Nontraumatic chronic subdural hemorrhage: Secondary | ICD-10-CM | POA: Diagnosis not present

## 2024-05-04 DIAGNOSIS — G9341 Metabolic encephalopathy: Secondary | ICD-10-CM | POA: Insufficient documentation

## 2024-05-04 DIAGNOSIS — R569 Unspecified convulsions: Secondary | ICD-10-CM | POA: Diagnosis not present

## 2024-05-04 DIAGNOSIS — N400 Enlarged prostate without lower urinary tract symptoms: Secondary | ICD-10-CM | POA: Insufficient documentation

## 2024-05-04 DIAGNOSIS — E039 Hypothyroidism, unspecified: Secondary | ICD-10-CM | POA: Insufficient documentation

## 2024-05-04 LAB — I-STAT CHEM 8, ED
BUN: 17 mg/dL (ref 8–23)
Calcium, Ion: 1.14 mmol/L — ABNORMAL LOW (ref 1.15–1.40)
Chloride: 104 mmol/L (ref 98–111)
Creatinine, Ser: 1 mg/dL (ref 0.61–1.24)
Glucose, Bld: 123 mg/dL — ABNORMAL HIGH (ref 70–99)
HCT: 42 % (ref 39.0–52.0)
Hemoglobin: 14.3 g/dL (ref 13.0–17.0)
Potassium: 3.9 mmol/L (ref 3.5–5.1)
Sodium: 140 mmol/L (ref 135–145)
TCO2: 21 mmol/L — ABNORMAL LOW (ref 22–32)

## 2024-05-04 LAB — URINALYSIS, ROUTINE W REFLEX MICROSCOPIC
Bilirubin Urine: NEGATIVE
Glucose, UA: NEGATIVE mg/dL
Hgb urine dipstick: NEGATIVE
Ketones, ur: NEGATIVE mg/dL
Leukocytes,Ua: NEGATIVE
Nitrite: NEGATIVE
Protein, ur: NEGATIVE mg/dL
Specific Gravity, Urine: 1.034 — ABNORMAL HIGH (ref 1.005–1.030)
pH: 5 (ref 5.0–8.0)

## 2024-05-04 LAB — DIFFERENTIAL
Abs Immature Granulocytes: 0.07 10*3/uL (ref 0.00–0.07)
Basophils Absolute: 0.1 10*3/uL (ref 0.0–0.1)
Basophils Relative: 1 %
Eosinophils Absolute: 0.3 10*3/uL (ref 0.0–0.5)
Eosinophils Relative: 3 %
Immature Granulocytes: 1 %
Lymphocytes Relative: 31 %
Lymphs Abs: 2.7 10*3/uL (ref 0.7–4.0)
Monocytes Absolute: 0.9 10*3/uL (ref 0.1–1.0)
Monocytes Relative: 11 %
Neutro Abs: 4.6 10*3/uL (ref 1.7–7.7)
Neutrophils Relative %: 53 %

## 2024-05-04 LAB — HEMOGLOBIN A1C
Hgb A1c MFr Bld: 5.4 % (ref 4.8–5.6)
Mean Plasma Glucose: 108.28 mg/dL

## 2024-05-04 LAB — URINE DRUG SCREEN
Amphetamines: NEGATIVE
Barbiturates: NEGATIVE
Benzodiazepines: NEGATIVE
Cocaine: NEGATIVE
Fentanyl: NEGATIVE
Methadone Scn, Ur: NEGATIVE
Opiates: NEGATIVE
Tetrahydrocannabinol: NEGATIVE

## 2024-05-04 LAB — CBG MONITORING, ED: Glucose-Capillary: 113 mg/dL — ABNORMAL HIGH (ref 70–99)

## 2024-05-04 LAB — COMPREHENSIVE METABOLIC PANEL WITH GFR
ALT: 13 U/L (ref 0–44)
AST: 16 U/L (ref 15–41)
Albumin: 3.9 g/dL (ref 3.5–5.0)
Alkaline Phosphatase: 84 U/L (ref 38–126)
Anion gap: 14 (ref 5–15)
BUN: 16 mg/dL (ref 8–23)
CO2: 21 mmol/L — ABNORMAL LOW (ref 22–32)
Calcium: 8.3 mg/dL — ABNORMAL LOW (ref 8.9–10.3)
Chloride: 103 mmol/L (ref 98–111)
Creatinine, Ser: 1 mg/dL (ref 0.61–1.24)
GFR, Estimated: >60 mL/min
Glucose, Bld: 123 mg/dL — ABNORMAL HIGH (ref 70–99)
Potassium: 4 mmol/L (ref 3.5–5.1)
Sodium: 138 mmol/L (ref 135–145)
Total Bilirubin: 0.3 mg/dL (ref 0.0–1.2)
Total Protein: 6.8 g/dL (ref 6.5–8.1)

## 2024-05-04 LAB — CBC
HCT: 41.9 % (ref 39.0–52.0)
Hemoglobin: 14.9 g/dL (ref 13.0–17.0)
MCH: 32.3 pg (ref 26.0–34.0)
MCHC: 35.6 g/dL (ref 30.0–36.0)
MCV: 90.7 fL (ref 80.0–100.0)
Platelets: 177 10*3/uL (ref 150–400)
RBC: 4.62 MIL/uL (ref 4.22–5.81)
RDW: 13.2 % (ref 11.5–15.5)
WBC: 8.6 10*3/uL (ref 4.0–10.5)
nRBC: 0 % (ref 0.0–0.2)

## 2024-05-04 LAB — APTT: aPTT: 28 s (ref 24–36)

## 2024-05-04 LAB — LIPID PANEL
Cholesterol: 202 mg/dL — ABNORMAL HIGH (ref 0–200)
HDL: 38 mg/dL — ABNORMAL LOW
LDL Cholesterol: 131 mg/dL — ABNORMAL HIGH (ref 0–99)
Total CHOL/HDL Ratio: 5.3 ratio
Triglycerides: 164 mg/dL — ABNORMAL HIGH
VLDL: 33 mg/dL (ref 0–40)

## 2024-05-04 LAB — PROTIME-INR
INR: 1 (ref 0.8–1.2)
Prothrombin Time: 13.6 s (ref 11.4–15.2)

## 2024-05-04 LAB — ETHANOL: Alcohol, Ethyl (B): <15 mg/dL

## 2024-05-04 MED ORDER — STROKE: EARLY STAGES OF RECOVERY BOOK: Freq: Once | Status: DC

## 2024-05-04 MED ORDER — ATORVASTATIN CALCIUM 40 MG PO TABS
40.0000 mg | ORAL_TABLET | Freq: Every day | ORAL | Status: DC
  Administered 2024-05-04: 40 mg via ORAL
  Filled 2024-05-04: qty 1

## 2024-05-04 MED ORDER — SODIUM CHLORIDE 0.9% FLUSH
3.0000 mL | Freq: Once | INTRAVENOUS | Status: AC
  Administered 2024-05-04: 3 mL via INTRAVENOUS

## 2024-05-04 MED ORDER — DUTASTERIDE 0.5 MG PO CAPS: 0.5000 mg | ORAL_CAPSULE | Freq: Every evening | ORAL | Status: DC

## 2024-05-04 MED ORDER — TAMSULOSIN HCL 0.4 MG PO CAPS: 0.4000 mg | ORAL_CAPSULE | Freq: Every day | ORAL | Status: DC

## 2024-05-04 MED ORDER — CARBAMAZEPINE 200 MG PO TABS
400.0000 mg | ORAL_TABLET | Freq: Three times a day (TID) | ORAL | Status: DC
  Administered 2024-05-04: 400 mg via ORAL
  Filled 2024-05-04 (×3): qty 2

## 2024-05-04 MED ORDER — CLOPIDOGREL BISULFATE 75 MG PO TABS: 75.0000 mg | ORAL_TABLET | Freq: Every day | ORAL | 0 refills | Status: AC

## 2024-05-04 MED ORDER — MECLIZINE HCL 25 MG PO TABS: 25.0000 mg | ORAL_TABLET | Freq: Three times a day (TID) | ORAL | Status: DC | PRN

## 2024-05-04 MED ORDER — ATORVASTATIN CALCIUM 40 MG PO TABS: 40.0000 mg | ORAL_TABLET | Freq: Every day | ORAL | 0 refills | Status: AC

## 2024-05-04 MED ORDER — CLOPIDOGREL BISULFATE 75 MG PO TABS
75.0000 mg | ORAL_TABLET | Freq: Every day | ORAL | Status: DC
  Administered 2024-05-04: 75 mg via ORAL
  Filled 2024-05-04: qty 1

## 2024-05-04 MED ORDER — TAMSULOSIN HCL 0.4 MG PO CAPS: 0.4000 mg | ORAL_CAPSULE | Freq: Every day | ORAL | Status: AC

## 2024-05-04 MED ORDER — AMLODIPINE BESYLATE 5 MG PO TABS: 2.5000 mg | ORAL_TABLET | Freq: Every day | ORAL | Status: DC

## 2024-05-04 MED ORDER — LEVOTHYROXINE SODIUM 112 MCG PO TABS: 112.0000 ug | ORAL_TABLET | Freq: Every day | ORAL | Status: DC

## 2024-05-04 MED ORDER — ASPIRIN 81 MG PO TBEC: 81.0000 mg | DELAYED_RELEASE_TABLET | Freq: Every day | ORAL | Status: DC

## 2024-05-04 MED ORDER — IOHEXOL 350 MG/ML SOLN
75.0000 mL | Freq: Once | INTRAVENOUS | Status: AC | PRN
  Administered 2024-05-04: 75 mL via INTRAVENOUS

## 2024-05-04 MED ORDER — ASPIRIN 81 MG PO TBEC
81.0000 mg | DELAYED_RELEASE_TABLET | Freq: Every day | ORAL | Status: DC
  Administered 2024-05-04: 81 mg via ORAL
  Filled 2024-05-04: qty 1

## 2024-05-04 NOTE — Progress Notes (Signed)
 Routine EEG complete. Results pending.

## 2024-05-04 NOTE — ED Notes (Signed)
 Patient transported to MRI

## 2024-05-04 NOTE — ED Notes (Signed)
 CCMD called.

## 2024-05-04 NOTE — Consult Note (Addendum)
 NEUROLOGY CONSULT NOTE   Date of service: May 04, 2024 Patient Name: Brandon Flores MRN:  [id number] DOB:  [birth year] Chief Complaint: aphasia Requesting Provider: Nettie Earing, MD  History of Present Illness  Brandon Flores Sr is a 79 y.o. 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  He reports he has chronic vertigo and so is very careful about getting up from the bed and will usually sit at the edge of the bed for little bit before he will try to stand up and walk.  He reports that he got out of bed 4 in the morning and attempted to sit up at the edge of the bed 26 patient and he was unable to sit up and fell backwards into the bed.  This was initially thought to be secondary to his vertigo when wife checked in on him, he seemed confused and not appropriate for the situation.  Wife called EMS and they noted the patient was ataxic and had difficulty controlling both of his arms.  They noticed that his speech was slurred and he was having some difficulty finding the right words.  On arrival to the ED, he is ataxia appears to have resolved but he still appears to have aphasia although very mild.  I spoke with his wife and there was no noted seizure activity.  Patient denies having any seizure-like activity.  LKW: 0100 Modified rankin score: 1-No significant post stroke disability and can perform usual duties with stroke symptoms IV Thrombolysis: Not offered, review of MRI shows a prior hemorrhagic encephalomalacia in the left temporal lobe and given this along with rapidly improving symptoms, I opted against offering TNKase. EVT: Not offered, no LVO.    NIHSS components Score: Comment  1a Level of Conscious 0[x]  1[]  2[]  3[]      1b LOC Questions 0[]  1[x]  2[]       1c LOC Commands  0[x]  1[]  2[]       2 Best Gaze 0[x]  1[]  2[]       3 Visual 0[x]  1[]  2[]  3[]      4 Facial Palsy 0[x]  1[]  2[]  3[]      5a Motor Arm - left 0[x]  1[]  2[]  3[]  4[]  UN[]    5b Motor Arm - Right 0[x]  1[]  2[]  3[]  4[]  UN[]    6a Motor Leg - Left 0[x]  1[]  2[]  3[]  4[]  UN[]    6b Motor Leg - Right 0[x]  1[]  2[]  3[]  4[]  UN[]    7 Limb Ataxia 0[x]  1[]  2[]  UN[]      8 Sensory 0[x]  1[]  2[]  UN[]      9 Best Language 0[]  1[x]  2[]  3[]      10 Dysarthria 0[x]  1[]  2[]  UN[]      11 Extinct. and Inattention 0[x]  1[]  2[]       TOTAL: 2      ROS  Comprehensive ROS performed and pertinent positives documented in HPI   Past History   Past Medical History:  Diagnosis Date   Cervical radiculopathy    Colon polyp    (2006- adenoma and tubulovillous adenoma; 2010-adenomas; repeat 201)-Dr. Vicci   Complex partial seizure disorder (HCC) 10/08/2014   COVID-19 2021   GERD (gastroesophageal reflux disease)    Gout    Hypercholesterolemia    Lateral epicondylitis    2002   Migraine    headaches   Ophthalmalgia    Dr. Roz  Orthodontics    Dr. Hughie   Partial epilepsy with impairment of consciousness (HCC)    Plantar fasciitis    Prostate cancer (HCC)    Seizure disorder (HCC)    Dr. Cleotilde ([facility])   Seizures [facility])    Traumatic incomplete tear of left rotator cuff 2024   UTI (urinary tract infection)    Vertebrobasilar insufficiency    Vertigo     Past Surgical History:  Procedure Laterality Date   BACK SURGERY     HEMORROIDECTOMY     NECK SURGERY     PROSTATE BIOPSY      Family History: Family History  Problem Relation Age of Onset   Alcohol abuse Father    COPD Sister    Lung cancer Sister    Cancer Sister    Lung cancer Brother    Cancer Brother    Cancer Sister        lung   Lung cancer Sister    Lung cancer Brother    Cancer Brother    Seizures Neg Hx    Colon cancer Neg Hx    Esophageal cancer Neg Hx    Rectal cancer Neg Hx    Stomach cancer Neg Hx    Breast cancer Neg Hx     Prostate cancer Neg Hx    Pancreatic cancer Neg Hx     Social History  reports that he quit smoking about 38 years ago. His smoking use included cigarettes. He started smoking about 58 years ago. He has a 60 pack-year smoking history. He has never used smokeless tobacco. He reports that he does not drink alcohol and does not use drugs.  Allergies[1]  Medications  Current Medications[2]  Vitals   Vitals:   05/04/24 0500  Weight: 109.2 kg    Body mass index is 35.55 kg/m.   Physical Exam   General: Laying comfortably in bed; in no acute distress.  HENT: Normal oropharynx and mucosa. Normal external appearance of ears and nose.  Neck: Supple, no pain or tenderness  CV: No JVD. No peripheral edema.  Pulmonary: Symmetric Chest rise. Normal respiratory effort.  Abdomen: Soft to touch, non-tender.  Ext: No cyanosis, edema, or deformity  Skin: No rash. Normal palpation of skin.   Musculoskeletal: Normal digits and nails by inspection. No clubbing.   Neurologic Examination  Mental status/Cognition: Alert, oriented to self, place, but not to month.  Oriented to year, good attention.  Speech/language: Fluent, comprehension intact, names most but not all object.  When describing aphasia pictures, does make errors and has difficulty recalling a bridge. Repetition intact.  Cranial nerves:   CN II Pupils equal and reactive to light, no VF deficits    CN III,IV,VI EOM intact, no gaze preference or deviation, no nystagmus    CN V normal sensation in V1, V2, and V3 segments bilaterally    CN VII no asymmetry, no nasolabial fold flattening    CN VIII normal hearing to speech    CN IX & X normal palatal elevation, no uvular deviation    CN XI 5/5 head turn and 5/5 shoulder shrug bilaterally    CN XII midline tongue protrusion    Motor:  Muscle bulk: normal, tone normal, pronator drift none, tremor none Mvmt Root Nerve  Muscle Right Left Comments  SA C5/6 Ax Deltoid 5 5   EF C5/6 Mc  Biceps 5 5   EE C6/7/8 Rad Triceps 5 5   WF C6/7 Med FCR  WE C7/8 PIN ECU     F Ab C8/T1 U ADM/FDI 5 5   HF L1/2/3 Fem Illopsoas 5 5   KE L2/3/4 Fem Quad 5 5   DF L4/5 D Peron Tib Ant 5 5   PF S1/2 Tibial Grc/Sol 5 5    Sensation:  Light touch Intact throughout   Pin prick    Temperature    Vibration   Proprioception    Coordination/Complex Motor:  - Finger to Nose intact throughout - Heel to shin intact throughout - Rapid alternating movement intact BL - Gait: deferred.  Labs/Imaging/Neurodiagnostic studies   CBC:  Recent Labs  Lab May 30, 2024 0545 May 30, 2024 0553  WBC 8.6  --   NEUTROABS 4.6  --   HGB 14.9 14.3  HCT 41.9 42.0  MCV 90.7  --   PLT 177  --    Basic Metabolic Panel:  Lab Results  Component Value Date   NA 140 May 30, 2024   K 3.9 05/30/2024   CO2 26 02/28/2024   GLUCOSE 123 (H) 05-30-2024   BUN 17 05-30-2024   CREATININE 1.00 05/30/24   CALCIUM  9.4 02/28/2024   GFRNONAA >60 02/28/2024   GFRAA 83 11/11/2019   Lipid Panel:  Lab Results  Component Value Date   LDLCALC 132 (H) 09/20/2023   HgbA1c:  Lab Results  Component Value Date   HGBA1C 5.0 09/20/2023   Urine Drug Screen:     Component Value Date/Time   LABOPIA NONE DETECTED 03/08/2014 1211   COCAINSCRNUR NONE DETECTED 03/08/2014 1211   LABBENZ NONE DETECTED 03/08/2014 1211   AMPHETMU NONE DETECTED 03/08/2014 1211   THCU NONE DETECTED 03/08/2014 1211   LABBARB NONE DETECTED 03/08/2014 1211    Alcohol Level     Component Value Date/Time   ETH <11 03/08/2014 1139   INR  Lab Results  Component Value Date   INR 1.1 09/19/2023   APTT  Lab Results  Component Value Date   APTT 30 03/08/2014   AED levels: No results found for: PHENYTOIN, ZONISAMIDE, LAMOTRIGINE, LEVETIRACETA  CT Head without contrast(Personally reviewed): CTH was negative for a large hypodensity concerning for a large territory infarct or hyperdensity concerning for an ICH  CT angio Head and Neck  with contrast(Personally reviewed): No LVO  MRI Brain(Personally reviewed): pending  Neurodiagnostics rEEG:  pending  ASSESSMENT   DARIVS LUNDEN Sr is a 79 y.o. 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  He woke up and had vertigo, difficulty coordinating/controlling bilateral upper extremities as well as slurred speech and word finding difficulty.  Symptoms mostly resolved except for mild word finding difficulty on arrival to the ED which resolved shortly thereafter.  He was not offered TNKase as he was noted to have a prior hemorrhage in the anterior left temporal lobe of unclear etiology.  Also his symptoms were significantly improved.  He was not offered thrombectomy due to no LVO and too mild to treat.  High suspicion that he probably had a TIA.  RECOMMENDATIONS  - Frequent Neuro checks per stroke unit protocol - Recommend brain imaging with MRI Brain without contrast - Recommend obtaining TTE  - Recommend obtaining Lipid panel with LDL - Please start statin if LDL > 70 - Recommend HbA1c to evaluate for diabetes and how well it is controlled. - Antithrombotic - Aspirin  81mg  daily along with  plavix  75mg  daily x 21days, followed by Aspirin  81mg  daily alone. - Recommend DVT ppx - SBP goal - permissive hypertension first 24 h < 220/110. Held home meds.  - Recommend Telemetry monitoring for arrythmia - Recommend bedside swallow screen prior to PO intake. - Stroke education booklet - Recommend PT/OT/SLP consult - routine EEG in addition to stroke workup.  ______________________________________________________________________  Plan was discussed with patient and then later with his wife over the phone.  Plan also discussed with Dr. Nettie with the ED team.  Signed, Tomi Paddock, MD [facility]     [1] No Known Allergies [2]  Current Facility-Administered Medications:    sodium chloride  flush (NS) 0.9 % injection 3 mL, 3 mL, Intravenous, Once, Palumbo, April, MD  Current Outpatient Medications:    acetaminophen  (TYLENOL ) 325 MG tablet, Take 1-2 tablets (325-650 mg total) by mouth every 4 (four) hours as needed for mild pain (pain score 1-3)., Disp: , Rfl:    allopurinol  (ZYLOPRIM ) 100 MG tablet, Take 1 tablet (100 mg total) by mouth daily., Disp: 90 tablet, Rfl: 3   amLODipine  (NORVASC ) 2.5 MG tablet, Take 1 tablet (2.5 mg total) by mouth daily., Disp: 30 tablet, Rfl: 0   aspirin  EC 81 MG tablet, Take 1 tablet (81 mg total) by mouth daily. Swallow whole., Disp: , Rfl:    carbamazepine  (TEGRETOL ) 200 MG tablet, Take 2 tablets (400 mg total) by mouth 3 (three) times daily. WILL TRY SWITCH TO GENERIC, Disp: 540 tablet, Rfl: 4   cephALEXin  (KEFLEX ) 500 MG capsule, Take 1 capsule (500 mg total) by mouth 2 (two) times daily., Disp: 14 capsule, Rfl: 0   dutasteride  (AVODART ) 0.5 MG capsule, Take 1 capsule (0.5 mg total) by mouth every evening. (Patient not taking: Reported on 10/30/2023), Disp: 90 capsule, Rfl: 3   levothyroxine  (SYNTHROID ) 112 MCG tablet, Take 112 mcg by mouth daily., Disp: , Rfl:    meclizine  (ANTIVERT ) 25 MG tablet, Take 1 tablet (25 mg total) by mouth 3 (three) times daily as needed for dizziness., Disp: 30 tablet, Rfl: 6   tamsulosin  (FLOMAX ) 0.4 MG CAPS capsule, Take 2 capsules (0.8 mg total) by mouth daily after supper., Disp: 180 capsule, Rfl: 3

## 2024-05-04 NOTE — Code Documentation (Signed)
 Stroke Response Nurse Documentation Code Documentation  Brandon Flores is a 79 y.o. male arriving to [facility]  via [facility] on 2/2 with past medical hx of seizures, HLD, GERD. On No antithrombotic. Code stroke was activated by EMS.   Patient from home where he was LKW at 0100 and now complaining of aphasia .  Stroke team at the bedside on patient arrival. Labs drawn and patient cleared for CT by Dr. Nettie. Patient to CT with team. NIHSS 6, see documentation for details and code stroke times. Patient with disoriented, left leg weakness, bilateral limb ataxia, Expressive aphasia , and dysarthria  on exam. The following imaging was completed:  CT Head and CTA. Patient is not a candidate for IV Thrombolytic due to outside of window. Patient is not a candidate for IR due to No LVO.   Care Plan: neuro checks q2 hrs.   Bedside handoff with ED RN.    Griselda Alm ORN  Rapid Response RN

## 2024-05-04 NOTE — Plan of Care (Addendum)
 For detailed consult note please see Dr. Alcus earlier note.  In brief, patient has history of epilepsy on Tegretol , last grand mal seizure was 20 years ago.  He told me that 2 years ago his PCP tried to wean off Tegretol  but he is starting to have staring spells, and then Tegretol  was resumed.  No more seizure after.  He also had a history of hyperlipidemia, gout and intracranial stenosis.  History of vertigo, per patient, he had 5-6 spells of vertigo in the past more likely related to positional changes but cannot remember well each episode.  Normally lasting half an hour and resolved.   However this early morning 4 AM he sat up in the bed but fell back to the bed.  He tried to stand up and then he fell back to the bed again.  Felt whole-body weakness and wife found he had some slurred speech.  He also reported vertigo at that time.  EMS reported questionable confusion and arm ataxia.  He had a CT no acute abnormality.  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  No significant change from last CTA in 09/2023.  MRI no acute abnormality.  EF 60 to 65%, LDL 131, A1c 5.4.  UDS negative. EEG normal. Patient stated that he has vertigo resolved after MRI, and now he is back to his baseline, reported vertigo lasted about 2 hours this time.  Patient was taking aspirin  81 daily but stopped 2 months ago.  On exam, neuro intact, patient has no nystagmus, disconjugate or ataxia in arms or legs.  Patient symptoms concerning for TIA given posterior circulation vascular stenosis versus prolonged vertigo episode (? BPPV).  Recommend aspirin  and Plavix  for 3 weeks and then aspirin  alone.  Started Lipitor 40 for LDL not at goal.  Recommend PT before discharge with orthostatic vitals.  If orthostatic hypotension, recommend TED hose.  Follow-up with PCP and he had 06/18/2024  appointment with neurology at [facility] with Lauraine NP.  Neurology will sign off. Please call with questions. Thanks for the consult.  Ary Cummins, MD PhD Stroke Neurology 05/04/2024 12:08 PM

## 2024-05-04 NOTE — ED Provider Notes (Signed)
 " [facility] Provider Note   CSN: [id number] Arrival date & time: 05/04/24  9457     Patient presents with: No chief complaint on file.   Brandon Flores is a 79 y.o. male.   The history is provided by the EMS personnel and the patient. The history is limited by the condition of the patient.  Cerebrovascular Accident This is a new problem. The current episode started 3 to 5 hours ago. The problem occurs constantly. The problem has not changed since onset.Pertinent negatives include no chest pain, no abdominal pain, no headaches and no shortness of breath. Nothing aggravates the symptoms. Nothing relieves the symptoms. He has tried nothing for the symptoms. The treatment provided no relief.  Patient with previous ICH presents as a code stroke.  Difficulty with naming and recognition of objects and slowed speech.      Past Medical History:  Diagnosis Date   Cervical radiculopathy    Colon polyp    (2006- adenoma and tubulovillous adenoma; 2010-adenomas; repeat 201)-Dr. Vicci   Complex partial seizure disorder (HCC) 10/08/2014   COVID-19 2021   GERD (gastroesophageal reflux disease)    Gout    Hypercholesterolemia    Lateral epicondylitis    2002   Migraine    headaches   Ophthalmalgia    Dr. Roz   Orthodontics    Dr. Hughie   Partial epilepsy with impairment of consciousness (HCC)    Plantar fasciitis    Prostate cancer (HCC)    Seizure disorder (HCC)    Dr. Cleotilde ([facility])   Seizures [facility])    Traumatic incomplete tear of left rotator cuff 2024   UTI (urinary tract infection)    Vertebrobasilar insufficiency    Vertigo      Prior to Admission medications  Medication Sig Start Date End Date Taking? Authorizing Provider  acetaminophen  (TYLENOL ) 325 MG tablet Take 1-2 tablets (325-650 mg total) by mouth every 4 (four) hours as needed for mild pain (pain score 1-3). 09/25/23   Angiulli, Toribio PARAS, PA-C  allopurinol  (ZYLOPRIM )  100 MG tablet Take 1 tablet (100 mg total) by mouth daily. 08/19/20   Hilts, Ozell, MD  amLODipine  (NORVASC ) 2.5 MG tablet Take 1 tablet (2.5 mg total) by mouth daily. 09/27/23   Emeline Joesph BROCKS, DO  aspirin  EC 81 MG tablet Take 1 tablet (81 mg total) by mouth daily. Swallow whole. 09/26/23   Angiulli, Toribio PARAS, PA-C  carbamazepine  (TEGRETOL ) 200 MG tablet Take 2 tablets (400 mg total) by mouth 3 (three) times daily. WILL TRY SWITCH TO GENERIC 06/13/23   Gregg Lek, MD  cephALEXin  (KEFLEX ) 500 MG capsule Take 1 capsule (500 mg total) by mouth 2 (two) times daily. 02/28/24   Charlyn Sora, MD  dutasteride  (AVODART ) 0.5 MG capsule Take 1 capsule (0.5 mg total) by mouth every evening. Patient not taking: Reported on 10/30/2023 08/19/20   Hilts, Ozell, MD  levothyroxine  (SYNTHROID ) 112 MCG tablet Take 112 mcg by mouth daily. 09/17/23   [provider]  meclizine  (ANTIVERT ) 25 MG tablet Take 1 tablet (25 mg total) by mouth 3 (three) times daily as needed for dizziness. 06/08/19   Hilts, Ozell, MD  tamsulosin  (FLOMAX ) 0.4 MG CAPS capsule Take 2 capsules (0.8 mg total) by mouth daily after supper. 08/19/20   Hilts, Ozell, MD    Allergies: Patient has no known allergies.    Review of Systems  Constitutional:  Negative for fever.  Respiratory:  Negative  for shortness of breath.   Cardiovascular:  Negative for chest pain.  Gastrointestinal:  Negative for abdominal pain.  Neurological:  Positive for speech difficulty. Negative for headaches.  Psychiatric/Behavioral:  Positive for confusion.     Updated Vital Signs Wt 109.2 kg   BMI 35.55 kg/m   Physical Exam Vitals and nursing note reviewed.  Constitutional:      General: He is not in acute distress.    Appearance: Normal appearance. He is well-developed. He is not diaphoretic.  HENT:     Head: Normocephalic and atraumatic.     Nose: Nose normal.     Mouth/Throat:     Mouth: Mucous membranes are moist.     Pharynx: Oropharynx  is clear.  Eyes:     Conjunctiva/sclera: Conjunctivae normal.     Pupils: Pupils are equal, round, and reactive to light.  Cardiovascular:     Rate and Rhythm: Normal rate and regular rhythm.     Pulses: Normal pulses.     Heart sounds: Normal heart sounds.  Pulmonary:     Effort: Pulmonary effort is normal.     Breath sounds: Normal breath sounds. No wheezing or rales.  Abdominal:     General: Bowel sounds are normal.     Palpations: Abdomen is soft.     Tenderness: There is no abdominal tenderness. There is no guarding or rebound.  Musculoskeletal:        General: Normal range of motion.     Cervical back: Normal range of motion and neck supple.  Skin:    General: Skin is warm and dry.     Capillary Refill: Capillary refill takes less than 2 seconds.  Neurological:     Mental Status: He is alert.     Coordination: Coordination normal.     Comments: Difficulty naming objects,  color recognition, slow pedantic speech      (all labs ordered are listed, but only abnormal results are displayed) Results for orders placed or performed during the hospital encounter of 05/04/24  CBG monitoring, ED   Collection Time: 05/04/24  5:43 AM  Result Value Ref Range   Glucose-Capillary 113 (H) 70 - 99 mg/dL  Protime-INR   Collection Time: 05/04/24  5:45 AM  Result Value Ref Range   Prothrombin Time 13.6 11.4 - 15.2 seconds   INR 1.0 0.8 - 1.2  APTT   Collection Time: 05/04/24  5:45 AM  Result Value Ref Range   aPTT 28 24 - 36 seconds  CBC   Collection Time: 05/04/24  5:45 AM  Result Value Ref Range   WBC 8.6 4.0 - 10.5 K/uL   RBC 4.62 4.22 - 5.81 MIL/uL   Hemoglobin 14.9 13.0 - 17.0 g/dL   HCT 58.0 60.9 - 47.9 %   MCV 90.7 80.0 - 100.0 fL   MCH 32.3 26.0 - 34.0 pg   MCHC 35.6 30.0 - 36.0 g/dL   RDW 86.7 88.4 - 84.4 %   Platelets 177 150 - 400 K/uL   nRBC 0.0 0.0 - 0.2 %  Differential   Collection Time: 05/04/24  5:45 AM  Result Value Ref Range   Neutrophils Relative % 53 %    Neutro Abs 4.6 1.7 - 7.7 K/uL   Lymphocytes Relative 31 %   Lymphs Abs 2.7 0.7 - 4.0 K/uL   Monocytes Relative 11 %   Monocytes Absolute 0.9 0.1 - 1.0 K/uL   Eosinophils Relative 3 %   Eosinophils Absolute 0.3 0.0 - 0.5 K/uL  Basophils Relative 1 %   Basophils Absolute 0.1 0.0 - 0.1 K/uL   Immature Granulocytes 1 %   Abs Immature Granulocytes 0.07 0.00 - 0.07 K/uL  Comprehensive metabolic panel   Collection Time: 05/04/24  5:45 AM  Result Value Ref Range   Sodium 138 135 - 145 mmol/L   Potassium 4.0 3.5 - 5.1 mmol/L   Chloride 103 98 - 111 mmol/L   CO2 21 (L) 22 - 32 mmol/L   Glucose, Bld 123 (H) 70 - 99 mg/dL   BUN 16 8 - 23 mg/dL   Creatinine, Ser 8.99 0.61 - 1.24 mg/dL   Calcium  8.3 (L) 8.9 - 10.3 mg/dL   Total Protein 6.8 6.5 - 8.1 g/dL   Albumin 3.9 3.5 - 5.0 g/dL   AST 16 15 - 41 U/L   ALT 13 0 - 44 U/L   Alkaline Phosphatase 84 38 - 126 U/L   Total Bilirubin 0.3 0.0 - 1.2 mg/dL   GFR, Estimated >39 >39 mL/min   Anion gap 14 5 - 15  Ethanol   Collection Time: 05/04/24  5:45 AM  Result Value Ref Range   Alcohol, Ethyl (B) <15 <15 mg/dL  I-stat chem 8, ED   Collection Time: 05/04/24  5:53 AM  Result Value Ref Range   Sodium 140 135 - 145 mmol/L   Potassium 3.9 3.5 - 5.1 mmol/L   Chloride 104 98 - 111 mmol/L   BUN 17 8 - 23 mg/dL   Creatinine, Ser 8.99 0.61 - 1.24 mg/dL   Glucose, Bld 876 (H) 70 - 99 mg/dL   Calcium , Ion 1.14 (L) 1.15 - 1.40 mmol/L   TCO2 21 (L) 22 - 32 mmol/L   Hemoglobin 14.3 13.0 - 17.0 g/dL   HCT 57.9 60.9 - 47.9 %     .Critical Care  Performed by: Nettie Earing, MD Authorized by: Nettie Earing, MD   Critical care provider statement:    Critical care time (minutes):  30   Critical care end time:  05/04/2024 6:40 AM   Critical care was necessary to treat or prevent imminent or life-threatening deterioration of the following conditions:  CNS failure or compromise   Critical care was time spent personally by me on the following  activities:  Development of treatment plan with patient or surrogate, discussions with consultants, evaluation of patient's response to treatment, examination of patient, ordering and review of laboratory studies, ordering and review of radiographic studies, ordering and performing treatments and interventions, pulse oximetry, re-evaluation of patient's condition and review of old charts   I assumed direction of critical care for this patient from another provider in my specialty: no     Care discussed with: admitting provider      Medications Ordered in the ED  sodium chloride  flush (NS) 0.9 % injection 3 mL (has no administration in time range)  iohexol  (OMNIPAQUE ) 350 MG/ML injection 75 mL (75 mLs Intravenous Contrast Given 05/04/24 0607)                                    Medical Decision Making Patient BIB EMS as code stroke   Amount and/or Complexity of Data Reviewed Independent Historian: EMS    Details: See above  External Data Reviewed: notes.    Details: Previous notes reviewed  Labs: ordered.    Details: Normal coagulation studies normal sodium 138, normal potassium 4. Normal creatinine 1 normal white  count 8.6. normal hemoglobin 14.9, normal platelets.  Normal liver function panel negative alcohol level   Radiology: ordered and independent interpretation performed.    Details: No ICH on head CT Discussion of management or test interpretation with external provider(s): Case d/w Dr. Vanessa, please admit to medicine for stroke TIA work up  Case d/w [facility] hospitalist Dr. MALVA for admission   Risk Decision regarding hospitalization.    Final diagnoses:  None   The patient appears reasonably stabilized for admission considering the current resources, flow, and capabilities available in the ED at this time, and I doubt any other [facility] requiring further screening and/or treatment in the ED prior to admission.  ED Discharge Orders     None          Camryn Lampson,  MD 05/04/24 9359  "

## 2024-05-04 NOTE — ED Notes (Signed)
 Pt back from MRI

## 2024-05-04 NOTE — ED Triage Notes (Addendum)
 Pt BIB EMS for Code Stroke. LKW was 0100 when pt went to sleep. Wife reports pt woke up at 0500 fell and started acting abnormally. Presents with aphasia only.  EMS VS HR 70 97% RA 185/100 CBG 113

## 2024-06-18 ENCOUNTER — Ambulatory Visit: Admitting: Neurology
